# Patient Record
Sex: Female | Born: 1950
Health system: Southern US, Community
[De-identification: ages and names within clinical notes are randomized; demographics above are authoritative.]

## PROBLEM LIST (undated history)

## (undated) DIAGNOSIS — D649 Anemia, unspecified: Secondary | ICD-10-CM

## (undated) DIAGNOSIS — K219 Gastro-esophageal reflux disease without esophagitis: Secondary | ICD-10-CM

## (undated) DIAGNOSIS — N200 Calculus of kidney: Secondary | ICD-10-CM

## (undated) DIAGNOSIS — E785 Hyperlipidemia, unspecified: Secondary | ICD-10-CM

## (undated) DIAGNOSIS — I4892 Unspecified atrial flutter: Secondary | ICD-10-CM

## (undated) DIAGNOSIS — H269 Unspecified cataract: Secondary | ICD-10-CM

## (undated) DIAGNOSIS — M1990 Unspecified osteoarthritis, unspecified site: Secondary | ICD-10-CM

## (undated) DIAGNOSIS — T7840XA Allergy, unspecified, initial encounter: Secondary | ICD-10-CM

## (undated) DIAGNOSIS — I10 Essential (primary) hypertension: Secondary | ICD-10-CM

## (undated) DIAGNOSIS — R011 Cardiac murmur, unspecified: Secondary | ICD-10-CM

## (undated) HISTORY — DX: Unspecified osteoarthritis, unspecified site: M19.90

## (undated) HISTORY — DX: Anemia, unspecified: D64.9

## (undated) HISTORY — PX: TONSILLECTOMY: SUR1361

## (undated) HISTORY — DX: Allergy, unspecified, initial encounter: T78.40XA

## (undated) HISTORY — DX: Unspecified cataract: H26.9

## (undated) HISTORY — DX: Gastro-esophageal reflux disease without esophagitis: K21.9

## (undated) HISTORY — DX: Essential (primary) hypertension: I10

## (undated) HISTORY — DX: Unspecified atrial flutter: I48.92

## (undated) HISTORY — PX: EYE SURGERY: SHX253

## (undated) HISTORY — PX: SHOULDER SURGERY: SHX246

## (undated) HISTORY — DX: Hyperlipidemia, unspecified: E78.5

## (undated) HISTORY — DX: Cardiac murmur, unspecified: R01.1

## (undated) HISTORY — DX: Calculus of kidney: N20.0

---

## 1997-10-24 ENCOUNTER — Ambulatory Visit (HOSPITAL_COMMUNITY): Admission: RE | Admit: 1997-10-24 | Discharge: 1997-10-24 | Payer: Self-pay | Admitting: *Deleted

## 1998-08-23 ENCOUNTER — Ambulatory Visit (HOSPITAL_BASED_OUTPATIENT_CLINIC_OR_DEPARTMENT_OTHER): Admission: RE | Admit: 1998-08-23 | Discharge: 1998-08-23 | Payer: Self-pay | Admitting: *Deleted

## 1998-11-26 ENCOUNTER — Other Ambulatory Visit: Admission: RE | Admit: 1998-11-26 | Discharge: 1998-11-26 | Payer: Self-pay | Admitting: *Deleted

## 2000-02-04 ENCOUNTER — Ambulatory Visit (HOSPITAL_COMMUNITY): Admission: RE | Admit: 2000-02-04 | Discharge: 2000-02-04 | Payer: Self-pay | Admitting: *Deleted

## 2000-03-11 ENCOUNTER — Other Ambulatory Visit: Admission: RE | Admit: 2000-03-11 | Discharge: 2000-03-11 | Payer: Self-pay | Admitting: *Deleted

## 2001-03-22 ENCOUNTER — Other Ambulatory Visit: Admission: RE | Admit: 2001-03-22 | Discharge: 2001-03-22 | Payer: Self-pay | Admitting: Internal Medicine

## 2001-04-04 ENCOUNTER — Encounter: Payer: Self-pay | Admitting: Internal Medicine

## 2001-04-04 ENCOUNTER — Ambulatory Visit (HOSPITAL_COMMUNITY): Admission: RE | Admit: 2001-04-04 | Discharge: 2001-04-04 | Payer: Self-pay | Admitting: *Deleted

## 2002-03-22 ENCOUNTER — Other Ambulatory Visit: Admission: RE | Admit: 2002-03-22 | Discharge: 2002-03-22 | Payer: Self-pay | Admitting: Internal Medicine

## 2002-04-11 ENCOUNTER — Encounter: Payer: Self-pay | Admitting: Internal Medicine

## 2002-04-11 ENCOUNTER — Ambulatory Visit (HOSPITAL_COMMUNITY): Admission: RE | Admit: 2002-04-11 | Discharge: 2002-04-11 | Payer: Self-pay | Admitting: Internal Medicine

## 2003-04-24 ENCOUNTER — Ambulatory Visit (HOSPITAL_COMMUNITY): Admission: RE | Admit: 2003-04-24 | Discharge: 2003-04-24 | Payer: Self-pay | Admitting: Internal Medicine

## 2004-04-01 ENCOUNTER — Other Ambulatory Visit: Admission: RE | Admit: 2004-04-01 | Discharge: 2004-04-01 | Payer: Self-pay | Admitting: Internal Medicine

## 2004-04-29 ENCOUNTER — Ambulatory Visit (HOSPITAL_COMMUNITY): Admission: RE | Admit: 2004-04-29 | Discharge: 2004-04-29 | Payer: Self-pay | Admitting: Internal Medicine

## 2005-05-06 ENCOUNTER — Ambulatory Visit (HOSPITAL_COMMUNITY): Admission: RE | Admit: 2005-05-06 | Discharge: 2005-05-06 | Payer: Self-pay | Admitting: Pulmonary Disease

## 2005-12-31 ENCOUNTER — Other Ambulatory Visit: Admission: RE | Admit: 2005-12-31 | Discharge: 2005-12-31 | Payer: Self-pay | Admitting: Internal Medicine

## 2006-05-12 ENCOUNTER — Ambulatory Visit (HOSPITAL_COMMUNITY): Admission: RE | Admit: 2006-05-12 | Discharge: 2006-05-12 | Payer: Self-pay | Admitting: Internal Medicine

## 2007-05-17 ENCOUNTER — Ambulatory Visit (HOSPITAL_COMMUNITY): Admission: RE | Admit: 2007-05-17 | Discharge: 2007-05-17 | Payer: Self-pay | Admitting: Internal Medicine

## 2008-05-08 ENCOUNTER — Other Ambulatory Visit: Admission: RE | Admit: 2008-05-08 | Discharge: 2008-05-08 | Payer: Self-pay | Admitting: Internal Medicine

## 2008-05-29 ENCOUNTER — Ambulatory Visit (HOSPITAL_COMMUNITY): Admission: RE | Admit: 2008-05-29 | Discharge: 2008-05-29 | Payer: Self-pay | Admitting: Internal Medicine

## 2009-06-06 ENCOUNTER — Ambulatory Visit (HOSPITAL_COMMUNITY): Admission: RE | Admit: 2009-06-06 | Discharge: 2009-06-06 | Payer: Self-pay | Admitting: Internal Medicine

## 2010-05-19 ENCOUNTER — Other Ambulatory Visit
Admission: RE | Admit: 2010-05-19 | Discharge: 2010-05-19 | Payer: Self-pay | Source: Home / Self Care | Admitting: Internal Medicine

## 2010-06-24 ENCOUNTER — Ambulatory Visit (HOSPITAL_COMMUNITY)
Admission: RE | Admit: 2010-06-24 | Discharge: 2010-06-24 | Payer: Self-pay | Source: Home / Self Care | Attending: Internal Medicine | Admitting: Internal Medicine

## 2010-10-14 HISTORY — PX: CATARACT EXTRACTION: SUR2

## 2010-11-24 ENCOUNTER — Telehealth: Payer: Self-pay | Admitting: Gastroenterology

## 2010-11-25 NOTE — Telephone Encounter (Signed)
Ok to schedule.

## 2010-11-25 NOTE — Telephone Encounter (Signed)
Spoke with pt and she would like to have the colon done in September. Recall entered into the system.

## 2010-11-25 NOTE — Telephone Encounter (Signed)
Pt had colon in 2003, she is due for repeat colon in 2013. Pt has met her deductible with her insurance company and would like to have her colon this year. Dr. Arlyce Dice please advise.

## 2011-02-09 ENCOUNTER — Telehealth: Payer: Self-pay

## 2011-02-09 MED ORDER — PEG-KCL-NACL-NASULF-NA ASC-C 100 G PO SOLR: 1.0000 | Freq: Once | ORAL | Status: DC

## 2011-02-09 NOTE — Telephone Encounter (Signed)
Pt scheduled for previsit for colon 02/20/11@1 :30pm, scheduled for colon with Dr. Arlyce Dice 03/13/11@1 :30pm. Pt aware of appt dates and times. Rx sent to pharmacy for prep.

## 2011-02-14 HISTORY — PX: COLONOSCOPY: SHX174

## 2011-02-20 ENCOUNTER — Ambulatory Visit (AMBULATORY_SURGERY_CENTER): Payer: Managed Care, Other (non HMO)

## 2011-02-20 VITALS — Ht 66.0 in | Wt 177.8 lb

## 2011-02-20 DIAGNOSIS — Z1211 Encounter for screening for malignant neoplasm of colon: Secondary | ICD-10-CM

## 2011-02-23 ENCOUNTER — Telehealth: Payer: Self-pay | Admitting: Gastroenterology

## 2011-02-23 ENCOUNTER — Encounter: Payer: Self-pay | Admitting: *Deleted

## 2011-02-23 ENCOUNTER — Encounter: Payer: Self-pay | Admitting: Gastroenterology

## 2011-02-23 NOTE — Telephone Encounter (Signed)
Pt has Moviprep from pharmacy but has instructions for Suprep.  Will mail pt instructions for MoviPrep.  Pt instructed to call when she receives instructions so that a previsit nurse can review with her. Ezra Sites

## 2011-03-05 ENCOUNTER — Other Ambulatory Visit: Payer: Self-pay | Admitting: Gastroenterology

## 2011-03-06 ENCOUNTER — Other Ambulatory Visit: Payer: Self-pay | Admitting: Gastroenterology

## 2011-03-13 ENCOUNTER — Encounter: Payer: Self-pay | Admitting: Gastroenterology

## 2011-03-13 ENCOUNTER — Ambulatory Visit (AMBULATORY_SURGERY_CENTER): Payer: Managed Care, Other (non HMO) | Admitting: Gastroenterology

## 2011-03-13 DIAGNOSIS — Z1211 Encounter for screening for malignant neoplasm of colon: Secondary | ICD-10-CM

## 2011-03-13 DIAGNOSIS — K5730 Diverticulosis of large intestine without perforation or abscess without bleeding: Secondary | ICD-10-CM

## 2011-03-13 MED ORDER — SODIUM CHLORIDE 0.9 % IV SOLN: 500.0000 mL | INTRAVENOUS | Status: DC

## 2011-03-13 NOTE — Patient Instructions (Addendum)
Diverticulosis Diverticulosis is a common condition that develops when small pouches (diverticula) form in the wall of the colon. The risk of diverticulosis increases with age. It happens more often in people who eat a low-fiber diet. Most individuals with diverticulosis have no symptoms. Those individuals with symptoms usually experience belly (abdominal) pain, constipation, or loose stools (diarrhea). HOME CARE INSTRUCTIONS  Increase the amount of fiber in your diet as directed by your caregiver or dietician. This may reduce symptoms of diverticulosis.   Your caregiver may recommend taking a dietary fiber supplement.   Drink at least 6 to 8 glasses of water each day to prevent constipation.   Try not to strain when you have a bowel movement.   Your caregiver may recommend avoiding nuts and seeds to prevent complications, although this is still an uncertain benefit.   Only take over-the-counter or prescription medicines for pain, discomfort, or fever as directed by your caregiver.  FOODS HAVING HIGH FIBER CONTENT INCLUDE:  Fruits. Apple, peach, pear, tangerine, raisins, prunes.   Vegetables. Brussels sprouts, asparagus, broccoli, cabbage, carrot, cauliflower, romaine lettuce, spinach, summer squash, tomato, winter squash, zucchini.   Starchy Vegetables. Baked beans, kidney beans, lima beans, split peas, lentils, potatoes (with skin).   Grains. Whole wheat bread, brown rice, bran flake cereal, plain oatmeal, white rice, shredded wheat, bran muffins.  SEEK IMMEDIATE MEDICAL CARE IF:  You develop increasing pain or severe bloating.   You have an oral temperature above 100, not controlled by medicine.   You develop vomiting or bowel movements that are bloody or black.  Document Released: 02/27/2004 Document Re-Released: 11/19/2009 [facility] Patient Information 2011 [place], [place].   PLEASE FOLLOW DISCHARGE INSTRUCTIONS GIVEN TODAY. RESUME YOUR CURRENT MEDICATIONS TODAY. REPEAT  COLONOSCOPY IN 10 YEARS. CALL us WITH ANY QUESTIONS OR CONCERNS. THANK YOU!

## 2011-03-16 ENCOUNTER — Telehealth: Payer: Self-pay

## 2011-03-16 NOTE — Telephone Encounter (Signed)

## 2011-04-13 ENCOUNTER — Encounter: Payer: Self-pay | Admitting: Internal Medicine

## 2011-04-13 ENCOUNTER — Ambulatory Visit (INDEPENDENT_AMBULATORY_CARE_PROVIDER_SITE_OTHER): Payer: Managed Care, Other (non HMO) | Admitting: Internal Medicine

## 2011-04-13 VITALS — BP 140/90 | HR 66 | Ht 64.25 in | Wt 184.0 lb

## 2011-04-13 DIAGNOSIS — Z23 Encounter for immunization: Secondary | ICD-10-CM

## 2011-04-13 DIAGNOSIS — Z87442 Personal history of urinary calculi: Secondary | ICD-10-CM

## 2011-04-13 DIAGNOSIS — I10 Essential (primary) hypertension: Secondary | ICD-10-CM

## 2011-04-13 DIAGNOSIS — K219 Gastro-esophageal reflux disease without esophagitis: Secondary | ICD-10-CM

## 2011-04-13 DIAGNOSIS — E785 Hyperlipidemia, unspecified: Secondary | ICD-10-CM

## 2011-04-13 DIAGNOSIS — J45909 Unspecified asthma, uncomplicated: Secondary | ICD-10-CM

## 2011-04-13 MED ORDER — FLUTICASONE PROPIONATE HFA 110 MCG/ACT IN AERO: 1.0000 | INHALATION_SPRAY | Freq: Two times a day (BID) | RESPIRATORY_TRACT | Status: DC

## 2011-04-13 MED ORDER — ALBUTEROL SULFATE HFA 108 (90 BASE) MCG/ACT IN AERS: 2.0000 | INHALATION_SPRAY | Freq: Four times a day (QID) | RESPIRATORY_TRACT | Status: DC | PRN

## 2011-04-13 MED ORDER — HYDROCHLOROTHIAZIDE 25 MG PO TABS: 25.0000 mg | ORAL_TABLET | Freq: Every day | ORAL | Status: DC

## 2011-04-13 MED ORDER — ROSUVASTATIN CALCIUM 10 MG PO TABS: 10.0000 mg | ORAL_TABLET | Freq: Every day | ORAL | Status: DC

## 2011-04-13 MED ORDER — QUINAPRIL HCL 20 MG PO TABS: 20.0000 mg | ORAL_TABLET | Freq: Every day | ORAL | Status: DC

## 2011-04-13 MED ORDER — MONTELUKAST SODIUM 10 MG PO TABS: 10.0000 mg | ORAL_TABLET | ORAL | Status: DC

## 2011-04-13 MED ORDER — CLONIDINE HCL 0.1 MG PO TABS: 0.1000 mg | ORAL_TABLET | Freq: Every day | ORAL | Status: DC

## 2011-04-13 MED ORDER — LANSOPRAZOLE 30 MG PO CPDR: 30.0000 mg | DELAYED_RELEASE_CAPSULE | Freq: Every day | ORAL | Status: DC

## 2011-04-13 MED ORDER — IPRATROPIUM BROMIDE HFA 17 MCG/ACT IN AERS: 2.0000 | INHALATION_SPRAY | Freq: Four times a day (QID) | RESPIRATORY_TRACT | Status: DC

## 2011-04-13 MED ORDER — THEOPHYLLINE ER 300 MG PO TB12: 300.0000 mg | ORAL_TABLET | Freq: Every day | ORAL | Status: DC

## 2011-04-13 NOTE — Progress Notes (Signed)
  Subjective:    Patient ID: Penny Barnett, female    DOB: June 11, 1951, 60 y.o.   MRN: [id number]  HPI Pt comesin today as new patient visit . Her previous PCP was Dr Marisue Brooklyn who has chanegd her practice out of [facility].  Last check up June 2012  and no change.   Had bloodwork etc.  And also had pap  Last December. Apparently ok. Following chronic disease management: Asthma :    Stable now Using  Proventil.  Prn stable since 1996   2 meds  2  Inhalers  Blood pressure : Hypertension treatment since about 2000.   On meds no se .  Quinapril and clonidine  and hctz .for about  7 years.  LIPIDS:  On crestor since  About 2000  ? Was about 280 range .  Family  Hx :mom on meds had heart surgery  Valve   and sis has elevated lipid not on meds. Brother on meds.   Fathers side died in 71 s  Etoh.  HCM :  Gi doc  Screening Arlyce Dice Review of Systems Vit d  rx in the past    Now on otcs   Unclear what .    No cp sob gi gu changes  No rash joint swelling fever weight loss.   Past history family history social history reviewed in the electronic medical record.     Objective:   Physical Exam WDWN in nad HEENT: Normocephalic ;atraumatic , Eyes;  PERRL, EOMs  Full, lids and conjunctiva clear,,Ears: no deformities, canals nl, TM landmarks normal, Nose: no deformity or discharge  Mouth : OP clear without lesion or edema . Neck: Supple without adenopathy or masses or bruits Chest:  Clear to A&P without wheezes rales or rhonchi mild kyphosis CV:  S1-S2 no gallops or murmurs peripheral perfusion is normal Abdomen:  Sof,t normal bowel sounds without hepatosplenomegaly, no guarding rebound or masses no CVA tenderness Neuro grossly normal  No focal oriented x 3  No clubbing cyanosis or edema     Assessment & Plan:  ASTHMA:  On 3 controller meds and apparently stable   No se of meds per pt   Will continue at this time HT  Stable on triple meds   No change today and plan follow up LIPIDS  On meds  without se currently   VIt D   Will follow   GERD  On daily PPI    utd on colon  HCM seems utd but  Flu shot today   Get records of recent.

## 2011-04-19 ENCOUNTER — Encounter: Payer: Self-pay | Admitting: Internal Medicine

## 2011-04-19 DIAGNOSIS — Z87442 Personal history of urinary calculi: Secondary | ICD-10-CM | POA: Insufficient documentation

## 2011-04-19 DIAGNOSIS — E785 Hyperlipidemia, unspecified: Secondary | ICD-10-CM | POA: Insufficient documentation

## 2011-04-19 DIAGNOSIS — I10 Essential (primary) hypertension: Secondary | ICD-10-CM | POA: Insufficient documentation

## 2011-04-19 DIAGNOSIS — K219 Gastro-esophageal reflux disease without esophagitis: Secondary | ICD-10-CM | POA: Insufficient documentation

## 2011-04-19 DIAGNOSIS — J45909 Unspecified asthma, uncomplicated: Secondary | ICD-10-CM | POA: Insufficient documentation

## 2011-04-19 NOTE — Patient Instructions (Signed)
Continues same meds .  Will refill today . Call if flare Get records   Recent and labs .

## 2011-06-22 ENCOUNTER — Other Ambulatory Visit: Payer: Self-pay | Admitting: Internal Medicine

## 2011-06-22 DIAGNOSIS — Z1231 Encounter for screening mammogram for malignant neoplasm of breast: Secondary | ICD-10-CM

## 2011-07-03 ENCOUNTER — Ambulatory Visit (HOSPITAL_COMMUNITY)
Admission: RE | Admit: 2011-07-03 | Discharge: 2011-07-03 | Disposition: A | Discharge status: Home / Self Care | Payer: Managed Care, Other (non HMO) | Source: Ambulatory Visit | Attending: Internal Medicine | Admitting: Internal Medicine

## 2011-07-03 DIAGNOSIS — Z1231 Encounter for screening mammogram for malignant neoplasm of breast: Secondary | ICD-10-CM | POA: Insufficient documentation

## 2011-07-13 ENCOUNTER — Ambulatory Visit (HOSPITAL_COMMUNITY): Payer: Managed Care, Other (non HMO)

## 2011-09-14 ENCOUNTER — Ambulatory Visit (INDEPENDENT_AMBULATORY_CARE_PROVIDER_SITE_OTHER): Payer: Managed Care, Other (non HMO) | Admitting: Internal Medicine

## 2011-09-14 ENCOUNTER — Encounter: Payer: Self-pay | Admitting: Internal Medicine

## 2011-09-14 VITALS — BP 126/84 | HR 81 | Temp 98.3°F | Wt 181.0 lb

## 2011-09-14 DIAGNOSIS — K5730 Diverticulosis of large intestine without perforation or abscess without bleeding: Secondary | ICD-10-CM

## 2011-09-14 DIAGNOSIS — K5790 Diverticulosis of intestine, part unspecified, without perforation or abscess without bleeding: Secondary | ICD-10-CM

## 2011-09-14 DIAGNOSIS — R1032 Left lower quadrant pain: Secondary | ICD-10-CM

## 2011-09-14 LAB — POCT URINALYSIS DIPSTICK
Glucose, UA: NEGATIVE
Ketones, UA: NEGATIVE
Leukocytes, UA: NEGATIVE
Spec Grav, UA: 1.025
Urobilinogen, UA: 1

## 2011-09-14 MED ORDER — CIPROFLOXACIN HCL 500 MG PO TABS: 500.0000 mg | ORAL_TABLET | Freq: Two times a day (BID) | ORAL | Status: AC

## 2011-09-14 MED ORDER — METRONIDAZOLE 500 MG PO TABS: 500.0000 mg | ORAL_TABLET | Freq: Three times a day (TID) | ORAL | Status: AC

## 2011-09-14 NOTE — Patient Instructions (Signed)
This could be diverticulitis.  Antibiotic as discussed  . Expect improvement in the next 48 - 72 hours . Contact us if  Worsening. Or fever. Consider ct scans and other evals as appropriate.  Recheck in about 10 days .    Diverticulitis A diverticulum is a small pouch or sac on the colon. Diverticulosis is the presence of these diverticula on the colon. Diverticulitis is the irritation (inflammation) or infection of diverticula. CAUSES  The colon and its diverticula contain bacteria. If food particles block the tiny opening to a diverticulum, the bacteria inside can grow and cause an increase in pressure. This leads to infection and inflammation and is called diverticulitis. SYMPTOMS   Abdominal pain and tenderness. Usually, the pain is located on the left side of your abdomen. However, it could be located elsewhere.   Fever.   Bloating.   Feeling sick to your stomach (nausea).   Throwing up (vomiting).   Abnormal stools.  DIAGNOSIS  Your caregiver will take a history and perform a physical exam. Since many things can cause abdominal pain, other tests may be necessary. Tests may include:  Blood tests.   Urine tests.   X-ray of the abdomen.   CT scan of the abdomen.  Sometimes, surgery is needed to determine if diverticulitis or other conditions are causing your symptoms. TREATMENT  Most of the time, you can be treated without surgery. Treatment includes:  Resting the bowels by only having liquids for a few days. As you improve, you will need to eat a low-fiber diet.   Intravenous (IV) fluids if you are losing body fluids (dehydrated).   Antibiotic medicines that treat infections may be given.   Pain and nausea medicine, if needed.   Surgery if the inflamed diverticulum has burst.  HOME CARE INSTRUCTIONS   Try a clear liquid diet (broth, tea, or water for as long as directed by your caregiver). You may then gradually begin a low-fiber diet as tolerated. A low-fiber  diet is a diet with less than 10 grams of fiber. Choose the foods below to reduce fiber in the diet:   White breads, cereals, rice, and pasta.   Cooked fruits and vegetables or soft fresh fruits and vegetables without the skin.   Ground or well-cooked tender beef, ham, veal, lamb, pork, or poultry.   Eggs and seafood.   After your diverticulitis symptoms have improved, your caregiver may put you on a high-fiber diet. A high-fiber diet includes 14 grams of fiber for every 1000 calories consumed. For a standard 2000 calorie diet, you would need 28 grams of fiber. Follow these diet guidelines to help you increase the fiber in your diet. It is important to slowly increase the amount fiber in your diet to avoid gas, constipation, and bloating.   Choose whole-grain breads, cereals, pasta, and brown rice.   Choose fresh fruits and vegetables with the skin on. Do not overcook vegetables because the more vegetables are cooked, the more fiber is lost.   Choose more nuts, seeds, legumes, dried peas, beans, and lentils.   Look for food products that have greater than 3 grams of fiber per serving on the Nutrition Facts label.   Take all medicine as directed by your caregiver.   If your caregiver has given you a follow-up appointment, it is very important that you go. Not going could result in lasting (chronic) or permanent injury, pain, and disability. If there is any problem keeping the appointment, call to reschedule.  SEEK  MEDICAL CARE IF:   Your pain does not improve.   You have a hard time advancing your diet beyond clear liquids.   Your bowel movements do not return to normal.  SEEK IMMEDIATE MEDICAL CARE IF:   Your pain becomes worse.   You have an oral temperature above 102 F (38.9 C), not controlled by medicine.   You have repeated vomiting.   You have bloody or black, tarry stools.   Symptoms that brought you to your caregiver become worse or are not getting better.  MAKE SURE  YOU:   Understand these instructions.   Will watch your condition.   Will get help right away if you are not doing well or get worse.  Document Released: 03/11/2005 Document Revised: 05/21/2011 Document Reviewed: 07/07/2010 [facility] Patient Information 2012 [place], [place].

## 2011-09-14 NOTE — Progress Notes (Signed)
  Subjective:    Patient ID: Penny Barnett, female    DOB: 04-17-1951, 61 y.o.   MRN: [id number]  HPI  Patient comes in today for SDA for  new problem evaluation.  onset march 29 left side pain   And better 2 days ago and then worse .  And no fever but anorexia .  No fever change in bowel habot no blood in stool.  No  VD but has decreased po intake  . No hx of diverticulitis .  No uto or uri sx.   Review of Systems Has hx of diverticulosis   No fever and no vomiting.   Hx of renal stones .  No hematuria.    No hx of same  Past history family history social history reviewed in the electronic medical record.     Objective:   Physical Exam BP 126/84  Pulse 81  Temp(Src) 98.3 F (36.8 C) (Oral)  Wt 181 lb (82.101 kg)  SpO2 99%  Physical Exam: Vital signs reviewed WUJ:WJXB is a well-developed well-nourished alert cooperative  white female who appears her stated age in no acute distress.  HEENT:Grossly normal  NECK: supple without masses, thyromegaly or bruits. CHEST/PULM:  Clear to auscultation and percussion breath sounds equal no wheeze , rales or rhonchi. No chest wall deformities or tenderness. CV: PMI is nondisplaced, S1 S2 no gallops, murmurs, rubs. Peripheral pulses are full without delay.No JVD .  ABDOMEN: Bowel sounds normal  No guard tender LLQ very localized  No rebound but has guarding local or rebound, no hepato splenomegal no CVA tenderness.  No hernia. Extremtities:  No clubbing cyanosis or edema,  NEURO:  Oriented x3, cranial nerves 3-12 appear to be intact, no obvious focal weakness,gait within normal limits no abnormal reflexes or asymmetrical SKIN: No acute rashes normal turgor, color, no bruising or petechiae. Marland Kitchen LN: no cervical  linguinal adenopathy      Assessment & Plan:  L abd pain  Very localized  No other alarm features  .  Assume poss diverticulitis.     Diverticulosis   Poss diverticulitis  utd on colonoscopy  .  Empiric rx with antibiotic .  Counseled. About alarm features and fu .   Soon .

## 2011-09-20 DIAGNOSIS — R1032 Left lower quadrant pain: Secondary | ICD-10-CM | POA: Insufficient documentation

## 2011-09-20 DIAGNOSIS — K5790 Diverticulosis of intestine, part unspecified, without perforation or abscess without bleeding: Secondary | ICD-10-CM | POA: Insufficient documentation

## 2011-09-23 ENCOUNTER — Encounter: Payer: Self-pay | Admitting: Internal Medicine

## 2011-09-23 ENCOUNTER — Ambulatory Visit (INDEPENDENT_AMBULATORY_CARE_PROVIDER_SITE_OTHER): Payer: Managed Care, Other (non HMO) | Admitting: Internal Medicine

## 2011-09-23 VITALS — BP 126/80 | HR 80 | Temp 98.4°F | Wt 181.0 lb

## 2011-09-23 DIAGNOSIS — T50905A Adverse effect of unspecified drugs, medicaments and biological substances, initial encounter: Secondary | ICD-10-CM

## 2011-09-23 DIAGNOSIS — T887XXA Unspecified adverse effect of drug or medicament, initial encounter: Secondary | ICD-10-CM

## 2011-09-23 DIAGNOSIS — K5730 Diverticulosis of large intestine without perforation or abscess without bleeding: Secondary | ICD-10-CM

## 2011-09-23 DIAGNOSIS — R1032 Left lower quadrant pain: Secondary | ICD-10-CM

## 2011-09-23 DIAGNOSIS — K5790 Diverticulosis of intestine, part unspecified, without perforation or abscess without bleeding: Secondary | ICD-10-CM

## 2011-09-23 DIAGNOSIS — Z0000 Encounter for general adult medical examination without abnormal findings: Secondary | ICD-10-CM

## 2011-09-23 LAB — BASIC METABOLIC PANEL
Calcium: 9 mg/dL (ref 8.4–10.5)
GFR: 123.91 mL/min (ref >60.00)
Glucose, Bld: 103 mg/dL — ABNORMAL HIGH (ref 70–99)
Potassium: 3.6 mEq/L (ref 3.5–5.1)
Sodium: 143 mEq/L (ref 135–145)

## 2011-09-23 LAB — HEPATIC FUNCTION PANEL
ALT: 38 U/L — ABNORMAL HIGH (ref 0–35)
AST: 26 U/L (ref 0–37)
Alkaline Phosphatase: 73 U/L (ref 39–117)
Bilirubin, Direct: 0 mg/dL (ref 0.0–0.3)
Total Protein: 7 g/dL (ref 6.0–8.3)

## 2011-09-23 LAB — LIPID PANEL
HDL: 57.5 mg/dL (ref >39.00)
Total CHOL/HDL Ratio: 3

## 2011-09-23 LAB — CBC WITH DIFFERENTIAL/PLATELET
Basophils Relative: 0.6 % (ref 0.0–3.0)
Eosinophils Relative: 3.2 % (ref 0.0–5.0)
Lymphocytes Relative: 24.3 % (ref 12.0–46.0)
Monocytes Relative: 7.9 % (ref 3.0–12.0)
Neutrophils Relative %: 64 % (ref 43.0–77.0)
RBC: 4.53 Mil/uL (ref 3.87–5.11)
WBC: 4.5 10*3/uL (ref 4.5–10.5)

## 2011-09-23 LAB — TSH: TSH: 0.56 u[IU]/mL (ref 0.35–5.50)

## 2011-09-23 NOTE — Patient Instructions (Signed)
No need for  Further antibiotic lab today and then planned follow up.

## 2011-09-23 NOTE — Progress Notes (Signed)
  Subjective:    Patient ID: Penny Barnett, female    DOB: Nov 21, 1950, 61 y.o.   MRN: [id number]  HPI Pt comesin for follow up of her abd pain presumed diverticultitis . She was given cipro and flagyl.  She took those antibiotics for about 7 days. The pain resolved in about 24-48 hours .  However she noted she had difficulty sleeping wondered if it was an interaction with theophylline. No vomiting a few loose stools no fever or blood.  So she stopped the antibiotic early and her sleep is better.  She is here to get laboratory studies for her checkup.   Review of Systems No fever vomiting hematuria  Past history family history social history reviewed in the electronic medical record.     Objective:   Physical Exam BP 126/80  Pulse 80  Temp(Src) 98.4 F (36.9 C) (Oral)  Wt 181 lb (82.101 kg)  SpO2 98% Looks well Abdominal exam normal no masses guarding rebound or tenderness and no flank pain.       Assessment & Plan:    LLQ pain better   presumed diverticulitis mild respond to antibiotic  Side effect of medication unclear interaction versus weakening effect is seen with some patient's with Cipro.  Because she is so much better and had a week of antibiotics no further treatment unless recurs no need for scans at this point she will get her fasting lab work today and implant her routine followup. She'll contact us if there's any recurrence in this difficulty.

## 2011-09-30 ENCOUNTER — Other Ambulatory Visit: Payer: Managed Care, Other (non HMO)

## 2011-10-07 ENCOUNTER — Encounter: Payer: Self-pay | Admitting: Internal Medicine

## 2011-10-07 ENCOUNTER — Ambulatory Visit (INDEPENDENT_AMBULATORY_CARE_PROVIDER_SITE_OTHER): Payer: Managed Care, Other (non HMO) | Admitting: Internal Medicine

## 2011-10-07 VITALS — BP 112/74 | HR 74 | Temp 98.3°F | Ht 64.0 in | Wt 175.0 lb

## 2011-10-07 DIAGNOSIS — Z0000 Encounter for general adult medical examination without abnormal findings: Secondary | ICD-10-CM

## 2011-10-07 DIAGNOSIS — E785 Hyperlipidemia, unspecified: Secondary | ICD-10-CM

## 2011-10-07 DIAGNOSIS — J45909 Unspecified asthma, uncomplicated: Secondary | ICD-10-CM

## 2011-10-07 DIAGNOSIS — I10 Essential (primary) hypertension: Secondary | ICD-10-CM

## 2011-10-07 DIAGNOSIS — R7989 Other specified abnormal findings of blood chemistry: Secondary | ICD-10-CM | POA: Insufficient documentation

## 2011-10-07 DIAGNOSIS — R945 Abnormal results of liver function studies: Secondary | ICD-10-CM

## 2011-10-07 DIAGNOSIS — K219 Gastro-esophageal reflux disease without esophagitis: Secondary | ICD-10-CM

## 2011-10-07 MED ORDER — THEOPHYLLINE ER 300 MG PO TB12: 300.0000 mg | ORAL_TABLET | Freq: Every day | ORAL | Status: DC

## 2011-10-07 MED ORDER — CLONIDINE HCL 0.1 MG PO TABS: 0.1000 mg | ORAL_TABLET | Freq: Every day | ORAL | Status: DC

## 2011-10-07 MED ORDER — QUINAPRIL HCL 20 MG PO TABS: 20.0000 mg | ORAL_TABLET | Freq: Every day | ORAL | Status: DC

## 2011-10-07 MED ORDER — HYDROCHLOROTHIAZIDE 25 MG PO TABS: 25.0000 mg | ORAL_TABLET | Freq: Every day | ORAL | Status: DC

## 2011-10-07 MED ORDER — MONTELUKAST SODIUM 10 MG PO TABS: 10.0000 mg | ORAL_TABLET | ORAL | Status: DC

## 2011-10-07 NOTE — Progress Notes (Signed)
Subjective:    Patient ID: Penny Barnett, female    DOB: [birth year], 61 y.o.   MRN: [id number]  HPI Patient comes in today for Preventive Health Care visit  Since her last visit her left lower quadrant pain is totally resolved and not resumed. Asthma stable on current medications. HT controlled no side effects of medicine LIPID no side effects of medicine reported is on low-dose Crestor If now on a different insurance only pays for generics. Hard to get individual insurance  Review of Systems ROS:  GEN/ HEENT: No fever, significant weight changes sweats headaches vision problems hearing changes, CV/ PULM; No chest pain shortness of breath cough, syncope,edema  change in exercise tolerance. GI /GU: No adominal pain, vomiting, change in bowel habits. No blood in the stool. No significant GU symptoms. SKIN/HEME: ,no acute skin rashes suspicious lesions or bleeding. No lymphadenopathy, nodules, masses.  NEURO/ PSYCH:  No neurologic signs such as weakness numbness. No depression anxiety. IMM/ Allergy: No unusual infections.  Allergy .   REST of 12 system review negative except as per HPI  Past history family history social history reviewed in the electronic medical record. Outpatient Prescriptions Prior to Visit  Medication Sig Dispense Refill  . albuterol (PROVENTIL HFA) 108 (90 BASE) MCG/ACT inhaler Inhale 2 puffs into the lungs every 6 (six) hours as needed.  3 Inhaler  0  . cholecalciferol (VITAMIN D) 1000 UNITS tablet Take 1,000 Units by mouth daily.        . fluticasone (FLOVENT HFA) 110 MCG/ACT inhaler Inhale 1 puff into the lungs 2 (two) times daily.  3 Inhaler  3  . ipratropium (ATROVENT HFA) 17 MCG/ACT inhaler Inhale 2 puffs into the lungs every 6 (six) hours.  3 Inhaler  3  . lansoprazole (PREVACID) 30 MG capsule Take 1 capsule (30 mg total) by mouth daily.  90 capsule  3  . Multiple Vitamins-Minerals (MULTIVITAMIN WITH MINERALS) tablet Take 1 tablet by mouth daily.        .  rosuvastatin (CRESTOR) 10 MG tablet Take 1 tablet (10 mg total) by mouth daily. 1/2 daily  90 tablet  3  . cloNIDine (CATAPRES) 0.1 MG tablet Take 1 tablet (0.1 mg total) by mouth daily.  90 tablet  3  . hydrochlorothiazide (HYDRODIURIL) 25 MG tablet Take 1 tablet (25 mg total) by mouth daily.  90 tablet  3  . montelukast (SINGULAIR) 10 MG tablet Take 1 tablet (10 mg total) by mouth every morning.  90 tablet  3  . quinapril (ACCUPRIL) 20 MG tablet Take 1 tablet (20 mg total) by mouth daily.  90 tablet  3  . theophylline (THEODUR) 300 MG 12 hr tablet Take 1 tablet (300 mg total) by mouth daily.  90 tablet  3        Objective:   Physical Exam BP 112/74  Pulse 74  Temp(Src) 98.3 F (36.8 C) (Oral)  Ht 5\' 4"  (1.626 m)  Wt 175 lb (79.379 kg)  BMI 30.04 kg/m2  SpO2 99% Physical Exam: Vital signs reviewed WUJ:WJXB is a well-developed well-nourished alert cooperative  white female who appears her stated age in no acute distress.  HEENT: normocephalic atraumatic , Eyes: PERRL EOM's full, conjunctiva clear, Nares: paten,t no deformity discharge or tenderness., Ears: no deformity EAC's clear TMs with normal landmarks. Mouth: clear OP, no lesions, edema.  Moist mucous membranes. Dentition in adequate repair. NECK: supple without masses, thyromegaly or bruits. CHEST/PULM:  Clear to auscultation and percussion breath sounds equal no  wheeze , rales or rhonchi. No chest wall deformities or tenderness. Breast: normal by inspection . No dimpling, discharge, masses, tenderness or discharge . CV: PMI is nondisplaced, S1 S2 no gallops, murmurs, rubs. Peripheral pulses are full without delay.No JVD .  ABDOMEN: Bowel sounds normal nontender  No guard or rebound, no hepato splenomegal no CVA tenderness.  No hernia. Extremtities:  No clubbing cyanosis or edema, no acute joint swelling or redness no focal atrophy NEURO:  Oriented x3, cranial nerves 3-12 appear to be intact, no obvious focal weakness,gait within  normal limits no abnormal reflexes or asymmetrical SKIN: No acute rashes normal turgor, color, no bruising or petechiae. PSYCH: Oriented, good eye contact, no obvious depression anxiety, cognition and judgment appear normal. LN: no cervical axillary inguinal adenopathy    Lab Results  Component Value Date   WBC 4.5 09/23/2011   HGB 12.4 09/23/2011   HCT 37.4 09/23/2011   PLT 229.0 09/23/2011   GLUCOSE 103* 09/23/2011   CHOL 150 09/23/2011   TRIG 65.0 09/23/2011   HDL 57.50 09/23/2011   LDLCALC 80 09/23/2011   ALT 38* 09/23/2011   AST 26 09/23/2011   NA 143 09/23/2011   K 3.6 09/23/2011   CL 107 09/23/2011   CREATININE 0.5 09/23/2011   BUN 18 09/23/2011   CO2 26 09/23/2011   TSH 0.56 09/23/2011      Assessment & Plan:  Preventive Health Care Counseled regarding healthy nutrition, exercise, sleep, injury prevention, calcium vit d and healthy weight . Due for a Pap on a three-year schedule last one was December 2011 HT stable  No med change ASTHMA stable no sx no need for pfts at this time  No med change LIPIDS  No change  LLQ P no recurrence presumed divertic utd on colonoscopy done in 2012 Abnormal lft alt minimal could be from medications illness or nonspecific Discussed this with the patient to be complete we will recheck her LFTs with hepatitis B and C. serologies in about 3 months if they are okay we will follow as needed or indicated

## 2011-10-07 NOTE — Patient Instructions (Addendum)
Continue lifestyle intervention healthy eating and exercise . No change in meds at this time . Pap smear every 3 years. Get Pneumovax at age 61 Look into cost of shingles shot.   The minor changes in liver tests could be from medications or the recent illness. Recheck liver tests with HEP b aby and ag and HEP c aby   in 3 months.  Labs without visit .  If okay can do checkup in a year.

## 2012-01-06 ENCOUNTER — Other Ambulatory Visit: Payer: Managed Care, Other (non HMO)

## 2012-07-27 ENCOUNTER — Other Ambulatory Visit: Payer: Self-pay | Admitting: *Deleted

## 2012-07-27 MED ORDER — MONTELUKAST SODIUM 10 MG PO TABS: 10.0000 mg | ORAL_TABLET | ORAL | Status: DC

## 2012-07-27 MED ORDER — HYDROCHLOROTHIAZIDE 25 MG PO TABS: 25.0000 mg | ORAL_TABLET | Freq: Every day | ORAL | Status: DC

## 2012-07-27 MED ORDER — CLONIDINE HCL 0.1 MG PO TABS: 0.1000 mg | ORAL_TABLET | Freq: Every day | ORAL | Status: DC

## 2012-07-27 MED ORDER — QUINAPRIL HCL 20 MG PO TABS: 20.0000 mg | ORAL_TABLET | Freq: Every day | ORAL | Status: DC

## 2012-07-27 MED ORDER — THEOPHYLLINE ER 300 MG PO TB12: 300.0000 mg | ORAL_TABLET | Freq: Every day | ORAL | Status: DC

## 2012-08-26 ENCOUNTER — Other Ambulatory Visit: Payer: Self-pay | Admitting: Internal Medicine

## 2012-08-26 DIAGNOSIS — Z1231 Encounter for screening mammogram for malignant neoplasm of breast: Secondary | ICD-10-CM

## 2012-09-06 ENCOUNTER — Ambulatory Visit (HOSPITAL_COMMUNITY)
Admission: RE | Admit: 2012-09-06 | Discharge: 2012-09-06 | Disposition: A | Discharge status: Home / Self Care | Payer: BC Managed Care – PPO | Source: Ambulatory Visit | Attending: Internal Medicine | Admitting: Internal Medicine

## 2012-09-06 DIAGNOSIS — Z1231 Encounter for screening mammogram for malignant neoplasm of breast: Secondary | ICD-10-CM

## 2012-10-07 ENCOUNTER — Encounter: Payer: Managed Care, Other (non HMO) | Admitting: Internal Medicine

## 2012-10-07 ENCOUNTER — Other Ambulatory Visit (INDEPENDENT_AMBULATORY_CARE_PROVIDER_SITE_OTHER): Payer: BC Managed Care – PPO

## 2012-10-07 DIAGNOSIS — Z0000 Encounter for general adult medical examination without abnormal findings: Secondary | ICD-10-CM

## 2012-10-07 LAB — HEPATIC FUNCTION PANEL
ALT: 36 U/L — ABNORMAL HIGH (ref 0–35)
Albumin: 4 g/dL (ref 3.5–5.2)
Alkaline Phosphatase: 103 U/L (ref 39–117)
Bilirubin, Direct: 0 mg/dL (ref 0.0–0.3)
Total Protein: 7 g/dL (ref 6.0–8.3)

## 2012-10-07 LAB — CBC WITH DIFFERENTIAL/PLATELET
Basophils Relative: 0.7 % (ref 0.0–3.0)
Eosinophils Relative: 4 % (ref 0.0–5.0)
Hemoglobin: 12.9 g/dL (ref 12.0–15.0)
Lymphocytes Relative: 25.9 % (ref 12.0–46.0)
MCV: 82.2 fl (ref 78.0–100.0)
Monocytes Absolute: 0.4 10*3/uL (ref 0.1–1.0)
Neutro Abs: 3.3 10*3/uL (ref 1.4–7.7)
Neutrophils Relative %: 61.7 % (ref 43.0–77.0)
RBC: 4.69 Mil/uL (ref 3.87–5.11)
WBC: 5.3 10*3/uL (ref 4.5–10.5)

## 2012-10-07 LAB — BASIC METABOLIC PANEL
CO2: 26 mEq/L (ref 19–32)
Chloride: 104 mEq/L (ref 96–112)
Creatinine, Ser: 0.6 mg/dL (ref 0.4–1.2)
Sodium: 139 mEq/L (ref 135–145)

## 2012-10-07 LAB — LDL CHOLESTEROL, DIRECT: Direct LDL: 159.2 mg/dL

## 2012-10-07 LAB — LIPID PANEL: Total CHOL/HDL Ratio: 4

## 2012-10-14 ENCOUNTER — Encounter: Payer: Self-pay | Admitting: Internal Medicine

## 2012-10-14 ENCOUNTER — Ambulatory Visit (INDEPENDENT_AMBULATORY_CARE_PROVIDER_SITE_OTHER): Payer: BC Managed Care – PPO | Admitting: Internal Medicine

## 2012-10-14 ENCOUNTER — Other Ambulatory Visit (HOSPITAL_COMMUNITY)
Admission: RE | Admit: 2012-10-14 | Discharge: 2012-10-14 | Disposition: A | Discharge status: Home / Self Care | Payer: BC Managed Care – PPO | Source: Ambulatory Visit | Attending: Internal Medicine | Admitting: Internal Medicine

## 2012-10-14 VITALS — BP 112/80 | HR 75 | Temp 97.8°F | Wt 178.0 lb

## 2012-10-14 DIAGNOSIS — T887XXA Unspecified adverse effect of drug or medicament, initial encounter: Secondary | ICD-10-CM

## 2012-10-14 DIAGNOSIS — Z01419 Encounter for gynecological examination (general) (routine) without abnormal findings: Secondary | ICD-10-CM

## 2012-10-14 DIAGNOSIS — I10 Essential (primary) hypertension: Secondary | ICD-10-CM

## 2012-10-14 DIAGNOSIS — E785 Hyperlipidemia, unspecified: Secondary | ICD-10-CM

## 2012-10-14 DIAGNOSIS — Z1151 Encounter for screening for human papillomavirus (HPV): Secondary | ICD-10-CM | POA: Insufficient documentation

## 2012-10-14 DIAGNOSIS — R7989 Other specified abnormal findings of blood chemistry: Secondary | ICD-10-CM

## 2012-10-14 DIAGNOSIS — T50905A Adverse effect of unspecified drugs, medicaments and biological substances, initial encounter: Secondary | ICD-10-CM

## 2012-10-14 DIAGNOSIS — R945 Abnormal results of liver function studies: Secondary | ICD-10-CM

## 2012-10-14 DIAGNOSIS — Z0000 Encounter for general adult medical examination without abnormal findings: Secondary | ICD-10-CM

## 2012-10-14 DIAGNOSIS — J45909 Unspecified asthma, uncomplicated: Secondary | ICD-10-CM

## 2012-10-14 NOTE — Patient Instructions (Addendum)
Try 5 mg  crestor every other day and if  No muscle aches we can recheck lipid and lfts etc in about 6-8 weeks.  Will also add hep c aby  If not already done.  If getting m,uscel aches  then contact us and we can try pravachol  Therapy  And then check lipids.  Check into cost for zostavax vaccine.     Preventive Care for Adults, Female A healthy lifestyle and preventive care can promote health and wellness. Preventive health guidelines for women include the following key practices.  A routine yearly physical is a good way to check with your caregiver about your health and preventive screening. It is a chance to share any concerns and updates on your health, and to receive a thorough exam.  Visit your dentist for a routine exam and preventive care every 6 months. Brush your teeth twice a day and floss once a day. Good oral hygiene prevents tooth decay and gum disease.  The frequency of eye exams is based on your age, health, family medical history, use of contact lenses, and other factors. Follow your caregiver's recommendations for frequency of eye exams.  Eat a healthy diet. Foods like vegetables, fruits, whole grains, low-fat dairy products, and lean protein foods contain the nutrients you need without too many calories. Decrease your intake of foods high in solid fats, added sugars, and salt. Eat the right amount of calories for you.Get information about a proper diet from your caregiver, if necessary.  Regular physical exercise is one of the most important things you can do for your health. Most adults should get at least 150 minutes of moderate-intensity exercise (any activity that increases your heart rate and causes you to sweat) each week. In addition, most adults need muscle-strengthening exercises on 2 or more days a week.  Maintain a healthy weight. The body mass index (BMI) is a screening tool to identify possible weight problems. It provides an estimate of body fat based on height  and weight. Your caregiver can help determine your BMI, and can help you achieve or maintain a healthy weight.For adults 20 years and older:  A BMI below 18.5 is considered underweight.  A BMI of 18.5 to 24.9 is normal.  A BMI of 25 to 29.9 is considered overweight.  A BMI of 30 and above is considered obese.  Maintain normal blood lipids and cholesterol levels by exercising and minimizing your intake of saturated fat. Eat a balanced diet with plenty of fruit and vegetables. Blood tests for lipids and cholesterol should begin at age 37 and be repeated every 5 years. If your lipid or cholesterol levels are high, you are over 50, or you are at high risk for heart disease, you may need your cholesterol levels checked more frequently.Ongoing high lipid and cholesterol levels should be treated with medicines if diet and exercise are not effective.  If you smoke, find out from your caregiver how to quit. If you do not use tobacco, do not start.  If you are pregnant, do not drink alcohol. If you are breastfeeding, be very cautious about drinking alcohol. If you are not pregnant and choose to drink alcohol, do not exceed 1 drink per day. One drink is considered to be 12 ounces (355 mL) of beer, 5 ounces (148 mL) of wine, or 1.5 ounces (44 mL) of liquor.  Avoid use of street drugs. Do not share needles with anyone. Ask for help if you need support or instructions about stopping  the use of drugs.  High blood pressure causes heart disease and increases the risk of stroke. Your blood pressure should be checked at least every 1 to 2 years. Ongoing high blood pressure should be treated with medicines if weight loss and exercise are not effective.  If you are 33 to 64 years old, ask your caregiver if you should take aspirin to prevent strokes.  Diabetes screening involves taking a blood sample to check your fasting blood sugar level. This should be done once every 3 years, after age 51, if you are within  normal weight and without risk factors for diabetes. Testing should be considered at a younger age or be carried out more frequently if you are overweight and have at least 1 risk factor for diabetes.  Breast cancer screening is essential preventive care for women. You should practice "breast self-awareness." This means understanding the normal appearance and feel of your breasts and may include breast self-examination. Any changes detected, no matter how small, should be reported to a caregiver. Women in their 85s and 30s should have a clinical breast exam (CBE) by a caregiver as part of a regular health exam every 1 to 3 years. After age 40, women should have a CBE every year. Starting at age 69, women should consider having a mammography (breast X-ray test) every year. Women who have a family history of breast cancer should talk to their caregiver about genetic screening. Women at a high risk of breast cancer should talk to their caregivers about having magnetic resonance imaging (MRI) and a mammography every year.  The Pap test is a screening test for cervical cancer. A Pap test can show cell changes on the cervix that might become cervical cancer if left untreated. A Pap test is a procedure in which cells are obtained and examined from the lower end of the uterus (cervix).  Women should have a Pap test starting at age 52.  Between ages 39 and 1, Pap tests should be repeated every 2 years.  Beginning at age 50, you should have a Pap test every 3 years as long as the past 3 Pap tests have been normal.  Some women have medical problems that increase the chance of getting cervical cancer. Talk to your caregiver about these problems. It is especially important to talk to your caregiver if a new problem develops soon after your last Pap test. In these cases, your caregiver may recommend more frequent screening and Pap tests.  The above recommendations are the same for women who have or have not gotten  the vaccine for human papillomavirus (HPV).  If you had a hysterectomy for a problem that was not cancer or a condition that could lead to cancer, then you no longer need Pap tests. Even if you no longer need a Pap test, a regular exam is a good idea to make sure no other problems are starting.  If you are between ages 48 and 33, and you have had normal Pap tests going back 10 years, you no longer need Pap tests. Even if you no longer need a Pap test, a regular exam is a good idea to make sure no other problems are starting.  If you have had past treatment for cervical cancer or a condition that could lead to cancer, you need Pap tests and screening for cancer for at least 20 years after your treatment.  If Pap tests have been discontinued, risk factors (such as a new sexual partner) need to  be reassessed to determine if screening should be resumed.  The HPV test is an additional test that may be used for cervical cancer screening. The HPV test looks for the virus that can cause the cell changes on the cervix. The cells collected during the Pap test can be tested for HPV. The HPV test could be used to screen women aged 44 years and older, and should be used in women of any age who have unclear Pap test results. After the age of 24, women should have HPV testing at the same frequency as a Pap test.  Colorectal cancer can be detected and often prevented. Most routine colorectal cancer screening begins at the age of 26 and continues through age 49. However, your caregiver may recommend screening at an earlier age if you have risk factors for colon cancer. On a yearly basis, your caregiver may provide home test kits to check for hidden blood in the stool. Use of a small camera at the end of a tube, to directly examine the colon (sigmoidoscopy or colonoscopy), can detect the earliest forms of colorectal cancer. Talk to your caregiver about this at age 66, when routine screening begins. Direct examination of  the colon should be repeated every 5 to 10 years through age 75, unless early forms of pre-cancerous polyps or small growths are found.  Hepatitis C blood testing is recommended for all people born from [birth year] through 1965 and any individual with known risks for hepatitis C.  Practice safe sex. Use condoms and avoid high-risk sexual practices to reduce the spread of sexually transmitted infections (STIs). STIs include gonorrhea, chlamydia, syphilis, trichomonas, herpes, HPV, and human immunodeficiency virus (HIV). Herpes, HIV, and HPV are viral illnesses that have no cure. They can result in disability, cancer, and death. Sexually active women aged 46 and younger should be checked for chlamydia. Older women with new or multiple partners should also be tested for chlamydia. Testing for other STIs is recommended if you are sexually active and at increased risk.  Osteoporosis is a disease in which the bones lose minerals and strength with aging. This can result in serious bone fractures. The risk of osteoporosis can be identified using a bone density scan. Women ages 36 and over and women at risk for fractures or osteoporosis should discuss screening with their caregivers. Ask your caregiver whether you should take a calcium supplement or vitamin D to reduce the rate of osteoporosis.  Menopause can be associated with physical symptoms and risks. Hormone replacement therapy is available to decrease symptoms and risks. You should talk to your caregiver about whether hormone replacement therapy is right for you.  Use sunscreen with sun protection factor (SPF) of 30 or more. Apply sunscreen liberally and repeatedly throughout the day. You should seek shade when your shadow is shorter than you. Protect yourself by wearing long sleeves, pants, a wide-brimmed hat, and sunglasses year round, whenever you are outdoors.  Once a month, do a whole body skin exam, using a mirror to look at the skin on your back. Notify  your caregiver of new moles, moles that have irregular borders, moles that are larger than a pencil eraser, or moles that have changed in shape or color.  Stay current with required immunizations.  Influenza. You need a dose every fall (or winter). The composition of the flu vaccine changes each year, so being vaccinated once is not enough.  Pneumococcal polysaccharide. You need 1 to 2 doses if you smoke cigarettes or if you  have certain chronic medical conditions. You need 1 dose at age 13 (or older) if you have never been vaccinated.  Tetanus, diphtheria, pertussis (Tdap, Td). Get 1 dose of Tdap vaccine if you are younger than age 86, are over 58 and have contact with an infant, are a Research scientist (physical sciences), are pregnant, or simply want to be protected from whooping cough. After that, you need a Td booster dose every 10 years. Consult your caregiver if you have not had at least 3 tetanus and diphtheria-containing shots sometime in your life or have a deep or dirty wound.  HPV. You need this vaccine if you are a woman age 57 or younger. The vaccine is given in 3 doses over 6 months.  Measles, mumps, rubella (MMR). You need at least 1 dose of MMR if you were born in 1957 or later. You may also need a second dose.  Meningococcal. If you are age 78 to 32 and a first-year college student living in a residence hall, or have one of several medical conditions, you need to get vaccinated against meningococcal disease. You may also need additional booster doses.  Zoster (shingles). If you are age 61 or older, you should get this vaccine.  Varicella (chickenpox). If you have never had chickenpox or you were vaccinated but received only 1 dose, talk to your caregiver to find out if you need this vaccine.  Hepatitis A. You need this vaccine if you have a specific risk factor for hepatitis A virus infection or you simply wish to be protected from this disease. The vaccine is usually given as 2 doses, 6 to 18  months apart.  Hepatitis B. You need this vaccine if you have a specific risk factor for hepatitis B virus infection or you simply wish to be protected from this disease. The vaccine is given in 3 doses, usually over 6 months. Preventive Services / Frequency Ages 3 to 1  Blood pressure check.** / Every 1 to 2 years.  Lipid and cholesterol check.** / Every 5 years beginning at age 59.  Clinical breast exam.** / Every 3 years for women in their 25s and 30s.  Pap test.** / Every 2 years from ages 76 through 71. Every 3 years starting at age 68 through age 39 or 73 with a history of 3 consecutive normal Pap tests.  HPV screening.** / Every 3 years from ages 43 through ages 53 to 44 with a history of 3 consecutive normal Pap tests.  Hepatitis C blood test.** / For any individual with known risks for hepatitis C.  Skin self-exam. / Monthly.  Influenza immunization.** / Every year.  Pneumococcal polysaccharide immunization.** / 1 to 2 doses if you smoke cigarettes or if you have certain chronic medical conditions.  Tetanus, diphtheria, pertussis (Tdap, Td) immunization. / A one-time dose of Tdap vaccine. After that, you need a Td booster dose every 10 years.  HPV immunization. / 3 doses over 6 months, if you are 98 and younger.  Measles, mumps, rubella (MMR) immunization. / You need at least 1 dose of MMR if you were born in 1957 or later. You may also need a second dose.  Meningococcal immunization. / 1 dose if you are age 9 to 20 and a first-year college student living in a residence hall, or have one of several medical conditions, you need to get vaccinated against meningococcal disease. You may also need additional booster doses.  Varicella immunization.** / Consult your caregiver.  Hepatitis A immunization.** / Consult  your caregiver. 2 doses, 6 to 18 months apart.  Hepatitis B immunization.** / Consult your caregiver. 3 doses usually over 6 months. Ages 51 to 83  Blood  pressure check.** / Every 1 to 2 years.  Lipid and cholesterol check.** / Every 5 years beginning at age 26.  Clinical breast exam.** / Every year after age 21.  Mammogram.** / Every year beginning at age 36 and continuing for as long as you are in good health. Consult with your caregiver.  Pap test.** / Every 3 years starting at age 49 through age 77 or 67 with a history of 3 consecutive normal Pap tests.  HPV screening.** / Every 3 years from ages 20 through ages 32 to 102 with a history of 3 consecutive normal Pap tests.  Fecal occult blood test (FOBT) of stool. / Every year beginning at age 37 and continuing until age 30. You may not need to do this test if you get a colonoscopy every 10 years.  Flexible sigmoidoscopy or colonoscopy.** / Every 5 years for a flexible sigmoidoscopy or every 10 years for a colonoscopy beginning at age 81 and continuing until age 58.  Hepatitis C blood test.** / For all people born from [birth year] through 1967 and any individual with known risks for hepatitis C.  Skin self-exam. / Monthly.  Influenza immunization.** / Every year.  Pneumococcal polysaccharide immunization.** / 1 to 2 doses if you smoke cigarettes or if you have certain chronic medical conditions.  Tetanus, diphtheria, pertussis (Tdap, Td) immunization.** / A one-time dose of Tdap vaccine. After that, you need a Td booster dose every 10 years.  Measles, mumps, rubella (MMR) immunization. / You need at least 1 dose of MMR if you were born in 1957 or later. You may also need a second dose.  Varicella immunization.** / Consult your caregiver.  Meningococcal immunization.** / Consult your caregiver.  Hepatitis A immunization.** / Consult your caregiver. 2 doses, 6 to 18 months apart.  Hepatitis B immunization.** / Consult your caregiver. 3 doses, usually over 6 months. Ages 67 and over  Blood pressure check.** / Every 1 to 2 years.  Lipid and cholesterol check.** / Every 5 years beginning  at age 48.  Clinical breast exam.** / Every year after age 35.  Mammogram.** / Every year beginning at age 35 and continuing for as long as you are in good health. Consult with your caregiver.  Pap test.** / Every 3 years starting at age 74 through age 82 or 91 with a 3 consecutive normal Pap tests. Testing can be stopped between 65 and 70 with 3 consecutive normal Pap tests and no abnormal Pap or HPV tests in the past 10 years.  HPV screening.** / Every 3 years from ages 25 through ages 66 or 8 with a history of 3 consecutive normal Pap tests. Testing can be stopped between 65 and 70 with 3 consecutive normal Pap tests and no abnormal Pap or HPV tests in the past 10 years.  Fecal occult blood test (FOBT) of stool. / Every year beginning at age 25 and continuing until age 77. You may not need to do this test if you get a colonoscopy every 10 years.  Flexible sigmoidoscopy or colonoscopy.** / Every 5 years for a flexible sigmoidoscopy or every 10 years for a colonoscopy beginning at age 46 and continuing until age 55.  Hepatitis C blood test.** / For all people born from [birth year] through 1967 and any individual with known risks for hepatitis  C.  Osteoporosis screening.** / A one-time screening for women ages 89 and over and women at risk for fractures or osteoporosis.  Skin self-exam. / Monthly.  Influenza immunization.** / Every year.  Pneumococcal polysaccharide immunization.** / 1 dose at age 30 (or older) if you have never been vaccinated.  Tetanus, diphtheria, pertussis (Tdap, Td) immunization. / A one-time dose of Tdap vaccine if you are over 65 and have contact with an infant, are a Research scientist (physical sciences), or simply want to be protected from whooping cough. After that, you need a Td booster dose every 10 years.  Varicella immunization.** / Consult your caregiver.  Meningococcal immunization.** / Consult your caregiver.  Hepatitis A immunization.** / Consult your caregiver. 2 doses, 6 to  18 months apart.  Hepatitis B immunization.** / Check with your caregiver. 3 doses, usually over 6 months. ** Family history and personal history of risk and conditions may change your caregiver's recommendations. Document Released: 07/28/2001 Document Revised: 08/24/2011 Document Reviewed: 10/27/2010 [facility] Patient Information 2013 [place], [place].   Continue lifestyle intervention healthy eating and exercise .

## 2012-10-14 NOTE — Progress Notes (Signed)
Chief Complaint  Patient presents with  . Annual Exam    HPI: Patient comes in today for Preventive Health Care visit  No major changes in her health since her last visit. She needs refills on her medications Due for a Pap smear it has been 3 years no history of cervical cancer Bp no side effects noted LIPIDS think she was getting muscle aches and bursitis from the Crestor 5 mg so she stopped it a month or 2 ago and seems to be doing better. Past history of side effects of atorvastatin and simvastatin Asthma  Rare use of rescue   doing well on current regimen.  ROS:  GEN/ HEENT: No fever, significant weight changes sweats headaches vision problems hearing changes, CV/ PULM; No chest pain shortness of breath cough, syncope,edema  change in exercise tolerance. GI /GU: No adominal pain, vomiting, change in bowel habits. No blood in the stool. No significant GU symptoms. SKIN/HEME: ,no acute skin rashes suspicious lesions or bleeding. No lymphadenopathy, nodules, masses.  NEURO/ PSYCH:  No neurologic signs such as weakness numbness. No depression anxiety. IMM/ Allergy: No unusual infections.  Allergy .   REST of 12 system review negative except as per HPI   Past Medical History  Diagnosis Date  . Asthma     as a child and then attack 1996  poss allergic to cat hair. ventilated x 2 . saw Dr Riverside Callas then .   Marland Kitchen GERD (gastroesophageal reflux disease)     2000 taking otc prevacid   evey day.   . Hypertension      3 meds controlled  . Hyperlipidemia   . Kidney stone     10 - 15 years     Family History  Problem Relation Age of Onset  . Breast cancer Mother   . Cancer Mother     bladder  . Uterine cancer Sister   . Colon cancer Neg Hx   . Alcohol abuse Father    Past Surgical History  Procedure Laterality Date  . Cataract extraction  10/14/10    both eyes  . Shoulder surgery      frozen shoulder rt 2006, lf 2000 [place].    History   Social History  . Marital Status: Single     Spouse Name: N/A    Number of Children: N/A  . Years of Education: N/A   Social History Main Topics  . Smoking status: Former Smoker    Quit date: 06/21/1986  . Smokeless tobacco: Never Used  . Alcohol Use: Yes     Comment: socially- once a month  . Drug Use: No  . Sexually Active: None   Other Topics Concern  . 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    HS education  single   6 hours of sleep   Hh  Of 1  Pet dog an horse.    Remote tobacco  1-2 ppd stopped 1988    Etoh couple times a month          Outpatient Encounter Prescriptions as of 10/14/2012  Medication Sig Dispense Refill  . albuterol (PROVENTIL HFA) 108 (90 BASE) MCG/ACT inhaler Inhale 2 puffs into the lungs every 6 (six) hours as needed.  3 Inhaler  0  .  cholecalciferol (VITAMIN D) 1000 UNITS tablet Take 1,000 Units by mouth daily.        . cloNIDine (CATAPRES) 0.1 MG tablet Take 1 tablet (0.1 mg total) by mouth daily.  90 tablet  0  . fluticasone (FLOVENT HFA) 110 MCG/ACT inhaler Inhale 1 puff into the lungs 2 (two) times daily.  3 Inhaler  3  . hydrochlorothiazide (HYDRODIURIL) 25 MG tablet Take 1 tablet (25 mg total) by mouth daily.  90 tablet  0  . ipratropium (ATROVENT HFA) 17 MCG/ACT inhaler Inhale 2 puffs into the lungs every 6 (six) hours.  3 Inhaler  3  . lansoprazole (PREVACID) 30 MG capsule Take 1 capsule (30 mg total) by mouth daily.  90 capsule  3  . montelukast (SINGULAIR) 10 MG tablet Take 1 tablet (10 mg total) by mouth every morning.  90 tablet  0  . Multiple Vitamins-Minerals (MULTIVITAMIN WITH MINERALS) tablet Take 1 tablet by mouth daily.        . quinapril (ACCUPRIL) 20 MG tablet Take 1 tablet (20 mg total) by mouth daily.  90 tablet  0  . rosuvastatin (CRESTOR) 10 MG tablet Take 1 tablet (10 mg total) by mouth daily. 1/2 daily  90 tablet  3  .  theophylline (THEODUR) 300 MG 12 hr tablet Take 1 tablet (300 mg total) by mouth daily.  90 tablet  0   No facility-administered encounter medications on file as of 10/14/2012.    EXAM:  BP 112/80  Pulse 75  Temp(Src) 97.8 F (36.6 C) (Oral)  Wt 178 lb (80.74 kg)  BMI 30.54 kg/m2  SpO2 98%  Body mass index is 30.54 kg/(m^2).  Physical Exam: Vital signs reviewed ZOX:WRUE is a well-developed well-nourished alert cooperative   female who appears her stated age in no acute distress.  HEENT: normocephalic atraumatic , Eyes: PERRL EOM's full, conjunctiva clear, Nares: paten,t no deformity discharge or tenderness., Ears: no deformity EAC's clear TMs with normal landmarks. Mouth: clear OP, no lesions, edema.  Moist mucous membranes. Dentition in adequate repair. NECK: supple without masses, thyromegaly or bruits. CHEST/PULM:  Clear to auscultation and percussion breath sounds equal no wheeze , rales or rhonchi. No chest wall deformities or tenderness. Breast: normal by inspection . No dimpling, discharge, masses, tenderness or discharge . CV: PMI is nondisplaced, S1 S2 no gallops, murmurs, rubs. Peripheral pulses are full without delay.No JVD .  ABDOMEN: Bowel sounds normal nontender  No guard or rebound, no hepato splenomegal no CVA tenderness.  No hernia. Extremtities:  No clubbing cyanosis or edema, no acute joint swelling or redness no focal atrophy NEURO:  Oriented x3, cranial nerves 3-12 appear to be intact, no obvious focal weakness,gait within normal limits no abnormal reflexes or asymmetrical SKIN: No acute rashes normal turgor, color, no bruising or petechiae. PSYCH: Oriented, good eye contact, no obvious depression anxiety, cognition and judgment appear normal. LN: no cervical axillary inguinal adenopathy Pelvic: NL ext GU, labia clear without lesions or rash . Vagina no lesions some atrophy.Cervix: clear exterior only partially visualized  UTERUS: Neg CMT Adnexa:  clear no masses .  PAP done HPV screen rectal no masses stool heme negative  Lab Results  Component Value Date   WBC 5.3 10/07/2012   HGB 12.9 10/07/2012   HCT 38.6 10/07/2012   PLT 197.0 10/07/2012   GLUCOSE 87 10/07/2012   CHOL 237* 10/07/2012   TRIG 86.0 10/07/2012   HDL 60.50 10/07/2012   LDLDIRECT 159.2 10/07/2012   LDLCALC 80 09/23/2011  ALT 36* 10/07/2012   AST 26 10/07/2012   NA 139 10/07/2012   K 3.8 10/07/2012   CL 104 10/07/2012   CREATININE 0.6 10/07/2012   BUN 22 10/07/2012   CO2 26 10/07/2012   TSH 0.71 10/07/2012    ASSESSMENT AND PLAN:  Discussed the following assessment and plan:  Visit for preventive health examination - Plan: PAP [[place]]  Hyperlipidemia - Elevated LDL off statin we'll try 5 mg every other day of Crestor otherwise we can switch to Pravachol discussed possibility of being off altogether   Hypertension - Continue  LFTs abnormal - Minor abnormalities are noted she does take ibuprofen a good bit we'll repeat in 6-8 weeks with hepatitis C antibody and repeat lipids.  Encounter for routine gynecological examination - Plan: PAP [[place]]  Asthma - By history quite stable rare rescue medicine use. Works daily in a barn  Adverse drug effect, initial encounter - 2 low-dose Crestor can try every other day if not switch to Pravachol  Patient Care Team: Madelin Headings, MD as PCP - General (Internal Medicine) Louis Meckel, MD (Gastroenterology) Fran Lowes., MD (Dermatology) Patient Instructions  Try 5 mg  crestor every other day and if  No muscle aches we can recheck lipid and lfts etc in about 6-8 weeks.  Will also add hep c aby  If not already done.  If getting m,uscel aches  then contact us and we can try pravachol  Therapy  And then check lipids.  Check into cost for zostavax vaccine.     Preventive Care for Adults, Female A healthy lifestyle and preventive care can promote health and wellness. Preventive health guidelines for women include the  following key practices.  A routine yearly physical is a good way to check with your caregiver about your health and preventive screening. It is a chance to share any concerns and updates on your health, and to receive a thorough exam.  Visit your dentist for a routine exam and preventive care every 6 months. Brush your teeth twice a day and floss once a day. Good oral hygiene prevents tooth decay and gum disease.  The frequency of eye exams is based on your age, health, family medical history, use of contact lenses, and other factors. Follow your caregiver's recommendations for frequency of eye exams.  Eat a healthy diet. Foods like vegetables, fruits, whole grains, low-fat dairy products, and lean protein foods contain the nutrients you need without too many calories. Decrease your intake of foods high in solid fats, added sugars, and salt. Eat the right amount of calories for you.Get information about a proper diet from your caregiver, if necessary.  Regular physical exercise is one of the most important things you can do for your health. Most adults should get at least 150 minutes of moderate-intensity exercise (any activity that increases your heart rate and causes you to sweat) each week. In addition, most adults need muscle-strengthening exercises on 2 or more days a week.  Maintain a healthy weight. The body mass index (BMI) is a screening tool to identify possible weight problems. It provides an estimate of body fat based on height and weight. Your caregiver can help determine your BMI, and can help you achieve or maintain a healthy weight.For adults 20 years and older:  A BMI below 18.5 is considered underweight.  A BMI of 18.5 to 24.9 is normal.  A BMI of 25 to 29.9 is considered overweight.  A BMI of  30 and above is considered obese.  Maintain normal blood lipids and cholesterol levels by exercising and minimizing your intake of saturated fat. Eat a balanced diet with plenty of  fruit and vegetables. Blood tests for lipids and cholesterol should begin at age 57 and be repeated every 5 years. If your lipid or cholesterol levels are high, you are over 50, or you are at high risk for heart disease, you may need your cholesterol levels checked more frequently.Ongoing high lipid and cholesterol levels should be treated with medicines if diet and exercise are not effective.  If you smoke, find out from your caregiver how to quit. If you do not use tobacco, do not start.  If you are pregnant, do not drink alcohol. If you are breastfeeding, be very cautious about drinking alcohol. If you are not pregnant and choose to drink alcohol, do not exceed 1 drink per day. One drink is considered to be 12 ounces (355 mL) of beer, 5 ounces (148 mL) of wine, or 1.5 ounces (44 mL) of liquor.  Avoid use of street drugs. Do not share needles with anyone. Ask for help if you need support or instructions about stopping the use of drugs.  High blood pressure causes heart disease and increases the risk of stroke. Your blood pressure should be checked at least every 1 to 2 years. Ongoing high blood pressure should be treated with medicines if weight loss and exercise are not effective.  If you are 13 to 64 years old, ask your caregiver if you should take aspirin to prevent strokes.  Diabetes screening involves taking a blood sample to check your fasting blood sugar level. This should be done once every 3 years, after age 67, if you are within normal weight and without risk factors for diabetes. Testing should be considered at a younger age or be carried out more frequently if you are overweight and have at least 1 risk factor for diabetes.  Breast cancer screening is essential preventive care for women. You should practice "breast self-awareness." This means understanding the normal appearance and feel of your breasts and may include breast self-examination. Any changes detected, no matter how small,  should be reported to a caregiver. Women in their 77s and 30s should have a clinical breast exam (CBE) by a caregiver as part of a regular health exam every 1 to 3 years. After age 44, women should have a CBE every year. Starting at age 20, women should consider having a mammography (breast X-ray test) every year. Women who have a family history of breast cancer should talk to their caregiver about genetic screening. Women at a high risk of breast cancer should talk to their caregivers about having magnetic resonance imaging (MRI) and a mammography every year.  The Pap test is a screening test for cervical cancer. A Pap test can show cell changes on the cervix that might become cervical cancer if left untreated. A Pap test is a procedure in which cells are obtained and examined from the lower end of the uterus (cervix).  Women should have a Pap test starting at age 38.  Between ages 28 and 42, Pap tests should be repeated every 2 years.  Beginning at age 44, you should have a Pap test every 3 years as long as the past 3 Pap tests have been normal.  Some women have medical problems that increase the chance of getting cervical cancer. Talk to your caregiver about these problems. It is especially important to  talk to your caregiver if a new problem develops soon after your last Pap test. In these cases, your caregiver may recommend more frequent screening and Pap tests.  The above recommendations are the same for women who have or have not gotten the vaccine for human papillomavirus (HPV).  If you had a hysterectomy for a problem that was not cancer or a condition that could lead to cancer, then you no longer need Pap tests. Even if you no longer need a Pap test, a regular exam is a good idea to make sure no other problems are starting.  If you are between ages 88 and 70, and you have had normal Pap tests going back 10 years, you no longer need Pap tests. Even if you no longer need a Pap test, a regular  exam is a good idea to make sure no other problems are starting.  If you have had past treatment for cervical cancer or a condition that could lead to cancer, you need Pap tests and screening for cancer for at least 20 years after your treatment.  If Pap tests have been discontinued, risk factors (such as a new sexual partner) need to be reassessed to determine if screening should be resumed.  The HPV test is an additional test that may be used for cervical cancer screening. The HPV test looks for the virus that can cause the cell changes on the cervix. The cells collected during the Pap test can be tested for HPV. The HPV test could be used to screen women aged 52 years and older, and should be used in women of any age who have unclear Pap test results. After the age of 56, women should have HPV testing at the same frequency as a Pap test.  Colorectal cancer can be detected and often prevented. Most routine colorectal cancer screening begins at the age of 3 and continues through age 33. However, your caregiver may recommend screening at an earlier age if you have risk factors for colon cancer. On a yearly basis, your caregiver may provide home test kits to check for hidden blood in the stool. Use of a small camera at the end of a tube, to directly examine the colon (sigmoidoscopy or colonoscopy), can detect the earliest forms of colorectal cancer. Talk to your caregiver about this at age 69, when routine screening begins. Direct examination of the colon should be repeated every 5 to 10 years through age 63, unless early forms of pre-cancerous polyps or small growths are found.  Hepatitis C blood testing is recommended for all people born from [birth year] through 1965 and any individual with known risks for hepatitis C.  Practice safe sex. Use condoms and avoid high-risk sexual practices to reduce the spread of sexually transmitted infections (STIs). STIs include gonorrhea, chlamydia, syphilis, trichomonas,  herpes, HPV, and human immunodeficiency virus (HIV). Herpes, HIV, and HPV are viral illnesses that have no cure. They can result in disability, cancer, and death. Sexually active women aged 61 and younger should be checked for chlamydia. Older women with new or multiple partners should also be tested for chlamydia. Testing for other STIs is recommended if you are sexually active and at increased risk.  Osteoporosis is a disease in which the bones lose minerals and strength with aging. This can result in serious bone fractures. The risk of osteoporosis can be identified using a bone density scan. Women ages 70 and over and women at risk for fractures or osteoporosis should discuss screening with  their caregivers. Ask your caregiver whether you should take a calcium supplement or vitamin D to reduce the rate of osteoporosis.  Menopause can be associated with physical symptoms and risks. Hormone replacement therapy is available to decrease symptoms and risks. You should talk to your caregiver about whether hormone replacement therapy is right for you.  Use sunscreen with sun protection factor (SPF) of 30 or more. Apply sunscreen liberally and repeatedly throughout the day. You should seek shade when your shadow is shorter than you. Protect yourself by wearing long sleeves, pants, a wide-brimmed hat, and sunglasses year round, whenever you are outdoors.  Once a month, do a whole body skin exam, using a mirror to look at the skin on your back. Notify your caregiver of new moles, moles that have irregular borders, moles that are larger than a pencil eraser, or moles that have changed in shape or color.  Stay current with required immunizations.  Influenza. You need a dose every fall (or winter). The composition of the flu vaccine changes each year, so being vaccinated once is not enough.  Pneumococcal polysaccharide. You need 1 to 2 doses if you smoke cigarettes or if you have certain chronic medical  conditions. You need 1 dose at age 74 (or older) if you have never been vaccinated.  Tetanus, diphtheria, pertussis (Tdap, Td). Get 1 dose of Tdap vaccine if you are younger than age 87, are over 62 and have contact with an infant, are a Research scientist (physical sciences), are pregnant, or simply want to be protected from whooping cough. After that, you need a Td booster dose every 10 years. Consult your caregiver if you have not had at least 3 tetanus and diphtheria-containing shots sometime in your life or have a deep or dirty wound.  HPV. You need this vaccine if you are a woman age 20 or younger. The vaccine is given in 3 doses over 6 months.  Measles, mumps, rubella (MMR). You need at least 1 dose of MMR if you were born in 1957 or later. You may also need a second dose.  Meningococcal. If you are age 54 to 11 and a first-year college student living in a residence hall, or have one of several medical conditions, you need to get vaccinated against meningococcal disease. You may also need additional booster doses.  Zoster (shingles). If you are age 25 or older, you should get this vaccine.  Varicella (chickenpox). If you have never had chickenpox or you were vaccinated but received only 1 dose, talk to your caregiver to find out if you need this vaccine.  Hepatitis A. You need this vaccine if you have a specific risk factor for hepatitis A virus infection or you simply wish to be protected from this disease. The vaccine is usually given as 2 doses, 6 to 18 months apart.  Hepatitis B. You need this vaccine if you have a specific risk factor for hepatitis B virus infection or you simply wish to be protected from this disease. The vaccine is given in 3 doses, usually over 6 months. Preventive Services / Frequency Ages 52 to 72  Blood pressure check.** / Every 1 to 2 years.  Lipid and cholesterol check.** / Every 5 years beginning at age 54.  Clinical breast exam.** / Every 3 years for women in their 44s and  30s.  Pap test.** / Every 2 years from ages 108 through 36. Every 3 years starting at age 57 through age 54 or 35 with a history of 3  consecutive normal Pap tests.  HPV screening.** / Every 3 years from ages 65 through ages 79 to 78 with a history of 3 consecutive normal Pap tests.  Hepatitis C blood test.** / For any individual with known risks for hepatitis C.  Skin self-exam. / Monthly.  Influenza immunization.** / Every year.  Pneumococcal polysaccharide immunization.** / 1 to 2 doses if you smoke cigarettes or if you have certain chronic medical conditions.  Tetanus, diphtheria, pertussis (Tdap, Td) immunization. / A one-time dose of Tdap vaccine. After that, you need a Td booster dose every 10 years.  HPV immunization. / 3 doses over 6 months, if you are 35 and younger.  Measles, mumps, rubella (MMR) immunization. / You need at least 1 dose of MMR if you were born in 1957 or later. You may also need a second dose.  Meningococcal immunization. / 1 dose if you are age 24 to 83 and a first-year college student living in a residence hall, or have one of several medical conditions, you need to get vaccinated against meningococcal disease. You may also need additional booster doses.  Varicella immunization.** / Consult your caregiver.  Hepatitis A immunization.** / Consult your caregiver. 2 doses, 6 to 18 months apart.  Hepatitis B immunization.** / Consult your caregiver. 3 doses usually over 6 months. Ages 55 to 30  Blood pressure check.** / Every 1 to 2 years.  Lipid and cholesterol check.** / Every 5 years beginning at age 52.  Clinical breast exam.** / Every year after age 91.  Mammogram.** / Every year beginning at age 33 and continuing for as long as you are in good health. Consult with your caregiver.  Pap test.** / Every 3 years starting at age 32 through age 59 or 26 with a history of 3 consecutive normal Pap tests.  HPV screening.** / Every 3 years from ages 59  through ages 56 to 64 with a history of 3 consecutive normal Pap tests.  Fecal occult blood test (FOBT) of stool. / Every year beginning at age 37 and continuing until age 38. You may not need to do this test if you get a colonoscopy every 10 years.  Flexible sigmoidoscopy or colonoscopy.** / Every 5 years for a flexible sigmoidoscopy or every 10 years for a colonoscopy beginning at age 64 and continuing until age 43.  Hepatitis C blood test.** / For all people born from [birth year] through 1965 and any individual with known risks for hepatitis C.  Skin self-exam. / Monthly.  Influenza immunization.** / Every year.  Pneumococcal polysaccharide immunization.** / 1 to 2 doses if you smoke cigarettes or if you have certain chronic medical conditions.  Tetanus, diphtheria, pertussis (Tdap, Td) immunization.** / A one-time dose of Tdap vaccine. After that, you need a Td booster dose every 10 years.  Measles, mumps, rubella (MMR) immunization. / You need at least 1 dose of MMR if you were born in 1957 or later. You may also need a second dose.  Varicella immunization.** / Consult your caregiver.  Meningococcal immunization.** / Consult your caregiver.  Hepatitis A immunization.** / Consult your caregiver. 2 doses, 6 to 18 months apart.  Hepatitis B immunization.** / Consult your caregiver. 3 doses, usually over 6 months. Ages 79 and over  Blood pressure check.** / Every 1 to 2 years.  Lipid and cholesterol check.** / Every 5 years beginning at age 29.  Clinical breast exam.** / Every year after age 20.  Mammogram.** / Every year beginning at age  40 and continuing for as long as you are in good health. Consult with your caregiver.  Pap test.** / Every 3 years starting at age 26 through age 76 or 17 with a 3 consecutive normal Pap tests. Testing can be stopped between 65 and 70 with 3 consecutive normal Pap tests and no abnormal Pap or HPV tests in the past 10 years.  HPV screening.** / Every 3  years from ages 83 through ages 38 or 59 with a history of 3 consecutive normal Pap tests. Testing can be stopped between 65 and 70 with 3 consecutive normal Pap tests and no abnormal Pap or HPV tests in the past 10 years.  Fecal occult blood test (FOBT) of stool. / Every year beginning at age 44 and continuing until age 60. You may not need to do this test if you get a colonoscopy every 10 years.  Flexible sigmoidoscopy or colonoscopy.** / Every 5 years for a flexible sigmoidoscopy or every 10 years for a colonoscopy beginning at age 78 and continuing until age 34.  Hepatitis C blood test.** / For all people born from [birth year] through 1965 and any individual with known risks for hepatitis C.  Osteoporosis screening.** / A one-time screening for women ages 65 and over and women at risk for fractures or osteoporosis.  Skin self-exam. / Monthly.  Influenza immunization.** / Every year.  Pneumococcal polysaccharide immunization.** / 1 dose at age 75 (or older) if you have never been vaccinated.  Tetanus, diphtheria, pertussis (Tdap, Td) immunization. / A one-time dose of Tdap vaccine if you are over 65 and have contact with an infant, are a Research scientist (physical sciences), or simply want to be protected from whooping cough. After that, you need a Td booster dose every 10 years.  Varicella immunization.** / Consult your caregiver.  Meningococcal immunization.** / Consult your caregiver.  Hepatitis A immunization.** / Consult your caregiver. 2 doses, 6 to 18 months apart.  Hepatitis B immunization.** / Check with your caregiver. 3 doses, usually over 6 months. ** Family history and personal history of risk and conditions may change your caregiver's recommendations. Document Released: 07/28/2001 Document Revised: 08/24/2011 Document Reviewed: 10/27/2010 [facility] Patient Information 2013 [place], [place].   Continue lifestyle intervention healthy eating and exercise .     Neta Mends. Eduardo Honor M.D. Health  Maintenance  Topic Date Due  . Zostavax  12/06/2008  . Influenza Vaccine  02/13/2013  . Pap Smear  05/15/2013  . Mammogram  09/07/2014  . Tetanus/tdap  06/15/2018  . Colonoscopy  03/12/2021   Health Maintenance Review

## 2012-10-17 ENCOUNTER — Other Ambulatory Visit: Payer: Self-pay | Admitting: Family Medicine

## 2012-10-17 MED ORDER — LANSOPRAZOLE 30 MG PO CPDR: 30.0000 mg | DELAYED_RELEASE_CAPSULE | Freq: Every day | ORAL | Status: DC

## 2012-10-17 MED ORDER — CLONIDINE HCL 0.1 MG PO TABS: 0.1000 mg | ORAL_TABLET | Freq: Every day | ORAL | Status: DC

## 2012-10-17 MED ORDER — MONTELUKAST SODIUM 10 MG PO TABS: 10.0000 mg | ORAL_TABLET | ORAL | Status: DC

## 2012-10-17 MED ORDER — HYDROCHLOROTHIAZIDE 25 MG PO TABS: 25.0000 mg | ORAL_TABLET | Freq: Every day | ORAL | Status: DC

## 2012-10-17 MED ORDER — FLUTICASONE PROPIONATE HFA 110 MCG/ACT IN AERO: 1.0000 | INHALATION_SPRAY | Freq: Two times a day (BID) | RESPIRATORY_TRACT | Status: DC

## 2012-10-17 MED ORDER — IPRATROPIUM BROMIDE HFA 17 MCG/ACT IN AERS: 2.0000 | INHALATION_SPRAY | Freq: Four times a day (QID) | RESPIRATORY_TRACT | Status: DC

## 2012-10-17 MED ORDER — QUINAPRIL HCL 20 MG PO TABS: 20.0000 mg | ORAL_TABLET | Freq: Every day | ORAL | Status: DC

## 2012-10-17 MED ORDER — THEOPHYLLINE ER 300 MG PO TB12: 300.0000 mg | ORAL_TABLET | Freq: Every day | ORAL | Status: DC

## 2012-10-20 ENCOUNTER — Encounter: Payer: Self-pay | Admitting: Family Medicine

## 2012-10-20 NOTE — Progress Notes (Signed)
Quick Note:  Tell patient PAP is normal. And negative HPV screen ______

## 2012-12-02 ENCOUNTER — Other Ambulatory Visit (INDEPENDENT_AMBULATORY_CARE_PROVIDER_SITE_OTHER): Payer: BC Managed Care – PPO

## 2012-12-02 DIAGNOSIS — J45909 Unspecified asthma, uncomplicated: Secondary | ICD-10-CM

## 2012-12-02 DIAGNOSIS — T50905A Adverse effect of unspecified drugs, medicaments and biological substances, initial encounter: Secondary | ICD-10-CM

## 2012-12-02 DIAGNOSIS — Z0000 Encounter for general adult medical examination without abnormal findings: Secondary | ICD-10-CM

## 2012-12-02 DIAGNOSIS — R945 Abnormal results of liver function studies: Secondary | ICD-10-CM

## 2012-12-02 DIAGNOSIS — Z01419 Encounter for gynecological examination (general) (routine) without abnormal findings: Secondary | ICD-10-CM

## 2012-12-02 DIAGNOSIS — I10 Essential (primary) hypertension: Secondary | ICD-10-CM

## 2012-12-02 DIAGNOSIS — T887XXA Unspecified adverse effect of drug or medicament, initial encounter: Secondary | ICD-10-CM

## 2012-12-02 DIAGNOSIS — E785 Hyperlipidemia, unspecified: Secondary | ICD-10-CM

## 2012-12-02 DIAGNOSIS — R7989 Other specified abnormal findings of blood chemistry: Secondary | ICD-10-CM

## 2012-12-02 LAB — HEPATIC FUNCTION PANEL
ALT: 39 U/L — ABNORMAL HIGH (ref 0–35)
AST: 28 U/L (ref 0–37)
Albumin: 4 g/dL (ref 3.5–5.2)

## 2012-12-02 LAB — FERRITIN: Ferritin: 8.1 ng/mL — ABNORMAL LOW (ref 10.0–291.0)

## 2012-12-02 LAB — LIPID PANEL
Cholesterol: 155 mg/dL (ref 0–200)
VLDL: 10.2 mg/dL (ref 0.0–40.0)

## 2012-12-03 LAB — HEPATITIS B SURFACE ANTIGEN: Hepatitis B Surface Ag: NEGATIVE

## 2012-12-06 LAB — HEPATITIS B SURFACE ANTIBODY,QUALITATIVE: Hep B S Ab: NONREACTIVE

## 2012-12-22 NOTE — Progress Notes (Signed)
Quick Note:  I tried to reach pt, no answer. ______

## 2012-12-26 ENCOUNTER — Telehealth: Payer: Self-pay | Admitting: Family Medicine

## 2012-12-26 NOTE — Telephone Encounter (Signed)
I spoke with pt and went over lab results.  

## 2012-12-26 NOTE — Progress Notes (Signed)
Quick Note:  I left voice message for pt to return my call. ______ 

## 2013-04-20 ENCOUNTER — Other Ambulatory Visit: Payer: Self-pay

## 2013-04-24 ENCOUNTER — Other Ambulatory Visit: Payer: Self-pay | Admitting: Internal Medicine

## 2013-04-24 MED ORDER — ALBUTEROL SULFATE HFA 108 (90 BASE) MCG/ACT IN AERS: 2.0000 | INHALATION_SPRAY | Freq: Four times a day (QID) | RESPIRATORY_TRACT | Status: DC | PRN

## 2013-09-24 ENCOUNTER — Encounter: Payer: Self-pay | Admitting: Internal Medicine

## 2013-09-25 ENCOUNTER — Other Ambulatory Visit: Payer: Self-pay | Admitting: Internal Medicine

## 2013-09-25 DIAGNOSIS — Z1231 Encounter for screening mammogram for malignant neoplasm of breast: Secondary | ICD-10-CM

## 2013-09-25 NOTE — Telephone Encounter (Signed)
Pt needs a cpx after 5/1, nothing available until August.  Please advise on where patient can be scheduled.  Thanks.

## 2013-09-26 NOTE — Telephone Encounter (Signed)
There is plenty of room to do this at patient convenience  On Wednesday s how about May 6th??

## 2013-10-03 ENCOUNTER — Ambulatory Visit (HOSPITAL_COMMUNITY)
Admission: RE | Admit: 2013-10-03 | Discharge: 2013-10-03 | Disposition: A | Discharge status: Home / Self Care | Payer: BC Managed Care – PPO | Source: Ambulatory Visit | Attending: Internal Medicine | Admitting: Internal Medicine

## 2013-10-03 DIAGNOSIS — Z1231 Encounter for screening mammogram for malignant neoplasm of breast: Secondary | ICD-10-CM | POA: Insufficient documentation

## 2013-10-06 ENCOUNTER — Other Ambulatory Visit: Payer: Self-pay | Admitting: Internal Medicine

## 2013-10-06 DIAGNOSIS — R928 Other abnormal and inconclusive findings on diagnostic imaging of breast: Secondary | ICD-10-CM

## 2013-10-06 NOTE — Telephone Encounter (Signed)
Pt has an appointment for 10/10/13 for labs at 10:00 and 10/16/13 for phy at 1:45 with Dr. Fabian SharpPanosh.

## 2013-10-10 ENCOUNTER — Other Ambulatory Visit (INDEPENDENT_AMBULATORY_CARE_PROVIDER_SITE_OTHER): Payer: BC Managed Care – PPO

## 2013-10-10 DIAGNOSIS — Z0000 Encounter for general adult medical examination without abnormal findings: Secondary | ICD-10-CM

## 2013-10-10 LAB — BASIC METABOLIC PANEL
BUN: 20 mg/dL (ref 6–23)
CALCIUM: 9.4 mg/dL (ref 8.4–10.5)
CO2: 27 meq/L (ref 19–32)
CREATININE: 0.5 mg/dL (ref 0.4–1.2)
Chloride: 102 mEq/L (ref 96–112)
GFR: 123.11 mL/min (ref >60.00)
GLUCOSE: 95 mg/dL (ref 70–99)
Potassium: 3.5 mEq/L (ref 3.5–5.1)
SODIUM: 138 meq/L (ref 135–145)

## 2013-10-10 LAB — CBC WITH DIFFERENTIAL/PLATELET
Basophils Absolute: 0 10*3/uL (ref 0.0–0.1)
Basophils Relative: 0.9 % (ref 0.0–3.0)
EOS ABS: 0.1 10*3/uL (ref 0.0–0.7)
Eosinophils Relative: 2 % (ref 0.0–5.0)
HCT: 38.2 % (ref 36.0–46.0)
Hemoglobin: 12.8 g/dL (ref 12.0–15.0)
LYMPHS PCT: 21.6 % (ref 12.0–46.0)
Lymphs Abs: 1 10*3/uL (ref 0.7–4.0)
MCHC: 33.4 g/dL (ref 30.0–36.0)
MCV: 80 fl (ref 78.0–100.0)
Monocytes Absolute: 0.4 10*3/uL (ref 0.1–1.0)
Monocytes Relative: 8 % (ref 3.0–12.0)
NEUTROS PCT: 67.5 % (ref 43.0–77.0)
Neutro Abs: 3.3 10*3/uL (ref 1.4–7.7)
Platelets: 216 10*3/uL (ref 150.0–400.0)
RBC: 4.78 Mil/uL (ref 3.87–5.11)
RDW: 15.1 % — AB (ref 11.5–14.6)
WBC: 4.8 10*3/uL (ref 4.5–10.5)

## 2013-10-10 LAB — HEPATIC FUNCTION PANEL
ALBUMIN: 4.1 g/dL (ref 3.5–5.2)
ALK PHOS: 65 U/L (ref 39–117)
ALT: 20 U/L (ref 0–35)
AST: 19 U/L (ref 0–37)
BILIRUBIN DIRECT: 0.1 mg/dL (ref 0.0–0.3)
BILIRUBIN TOTAL: 0.5 mg/dL (ref 0.3–1.2)
Total Protein: 7.2 g/dL (ref 6.0–8.3)

## 2013-10-10 LAB — LIPID PANEL
CHOL/HDL RATIO: 3
Cholesterol: 154 mg/dL (ref 0–200)
HDL: 60.9 mg/dL (ref >39.00)
LDL Cholesterol: 78 mg/dL (ref 0–99)
Triglycerides: 75 mg/dL (ref 0.0–149.0)
VLDL: 15 mg/dL (ref 0.0–40.0)

## 2013-10-10 LAB — TSH: TSH: 0.76 u[IU]/mL (ref 0.35–5.50)

## 2013-10-16 ENCOUNTER — Encounter: Payer: Self-pay | Admitting: Internal Medicine

## 2013-10-16 ENCOUNTER — Ambulatory Visit (INDEPENDENT_AMBULATORY_CARE_PROVIDER_SITE_OTHER): Payer: BC Managed Care – PPO | Admitting: Internal Medicine

## 2013-10-16 VITALS — BP 136/86 | HR 79 | Temp 98.4°F | Ht 64.25 in | Wt 181.0 lb

## 2013-10-16 DIAGNOSIS — I10 Essential (primary) hypertension: Secondary | ICD-10-CM

## 2013-10-16 DIAGNOSIS — Z23 Encounter for immunization: Secondary | ICD-10-CM

## 2013-10-16 DIAGNOSIS — E785 Hyperlipidemia, unspecified: Secondary | ICD-10-CM

## 2013-10-16 DIAGNOSIS — J45909 Unspecified asthma, uncomplicated: Secondary | ICD-10-CM

## 2013-10-16 DIAGNOSIS — L989 Disorder of the skin and subcutaneous tissue, unspecified: Secondary | ICD-10-CM | POA: Insufficient documentation

## 2013-10-16 DIAGNOSIS — K219 Gastro-esophageal reflux disease without esophagitis: Secondary | ICD-10-CM

## 2013-10-16 DIAGNOSIS — Z0000 Encounter for general adult medical examination without abnormal findings: Secondary | ICD-10-CM

## 2013-10-16 MED ORDER — CLONIDINE HCL 0.1 MG PO TABS: 0.1000 mg | ORAL_TABLET | Freq: Every day | ORAL | Status: DC

## 2013-10-16 MED ORDER — FLUTICASONE PROPIONATE HFA 110 MCG/ACT IN AERO: 1.0000 | INHALATION_SPRAY | Freq: Two times a day (BID) | RESPIRATORY_TRACT | Status: DC

## 2013-10-16 MED ORDER — QUINAPRIL HCL 20 MG PO TABS: 20.0000 mg | ORAL_TABLET | Freq: Every day | ORAL | Status: DC

## 2013-10-16 MED ORDER — THEOPHYLLINE ER 300 MG PO TB12: 300.0000 mg | ORAL_TABLET | Freq: Every day | ORAL | Status: DC

## 2013-10-16 MED ORDER — HYDROCHLOROTHIAZIDE 25 MG PO TABS: 25.0000 mg | ORAL_TABLET | Freq: Every day | ORAL | Status: DC

## 2013-10-16 MED ORDER — CYCLOBENZAPRINE HCL 5 MG PO TABS: 5.0000 mg | ORAL_TABLET | Freq: Three times a day (TID) | ORAL | Status: DC | PRN

## 2013-10-16 MED ORDER — MONTELUKAST SODIUM 10 MG PO TABS: 10.0000 mg | ORAL_TABLET | ORAL | Status: DC

## 2013-10-16 MED ORDER — LANSOPRAZOLE 30 MG PO CPDR: 30.0000 mg | DELAYED_RELEASE_CAPSULE | Freq: Every day | ORAL | Status: DC

## 2013-10-16 MED ORDER — ALBUTEROL SULFATE HFA 108 (90 BASE) MCG/ACT IN AERS: 2.0000 | INHALATION_SPRAY | Freq: Four times a day (QID) | RESPIRATORY_TRACT | Status: DC | PRN

## 2013-10-16 NOTE — Patient Instructions (Addendum)
Derm referral. For now ? What to do for medicare transition.  Check at each stage of appt.  Ok to use muscle relaxant occassionally caution with this.  Goal blood pressure is  Below   140/90 .   Check into shingles vaccine ( Zostavax) reimbursement or cost to you  and can return at any time if call ahead for injection. But some medicare plans don't pay for this.  Continue lifestyle intervention healthy eating and exercise . 150 minutes of exercise weeks  ,  Lose weightathealthy levels. Avoid trans fats and processed foods;  Increase fresh fruits and veges to 5 servings per day. And avoid sweet beverages  Including tea and juice.  cpx  In 1 year

## 2013-10-16 NOTE — Progress Notes (Signed)
Chief Complaint  Patient presents with  . Annual Exam  . Hyperlipidemia  . Hypertension  . Asthma    HPI: Patient comes in today for Preventive Health Care visit  No major change in health status since last visit .  BP usually 120 - 140.   / 70 - 80  No se of meds needs refill  Taking vit d .   Lipids taking crestor about every other day.  220.  At initiation?  Right scaly area. righ temple had pre cancer on nose removed a while back ? If the same ? Rx.   Asks for mild musc relaxant for prn use  Works hard with horses outside work  No sp injury today.  Asthma stable  Still on theophylline  No flares of asthma recently   GERD continues med   Health Maintenance  Topic Date Due  . Zostavax  12/06/2008  . Influenza Vaccine  01/13/2014  . Mammogram  10/04/2015  . Pap Smear  10/15/2015  . Tetanus/tdap  06/15/2018  . Colonoscopy  03/12/2021   Health Maintenance Review   ROS:  GEN/ HEENT: No fever, significant weight changes sweats headaches vision problems hearing changes, CV/ PULM; No chest pain shortness of breath cough, syncope,edema  change in exercise tolerance. GI /GU: No adominal pain, vomiting, change in bowel habits. No blood in the stool. No significant GU symptoms. SKIN/HEME: ,no  or bleeding. No lymphadenopathy, nodules, masses.  NEURO/ PSYCH:  No neurologic signs such as weakness numbness. No depression anxiety. IMM/ Allergy: No unusual infections.  Allergy .   REST of 12 system review negative except as per HPI   Past Medical History  Diagnosis Date  . Asthma     as a child and then attack 1996  poss allergic to cat hair. ventilated x 2 . saw Dr [place] CallasSharma then .   Marland Kitchen. GERD (gastroesophageal reflux disease)     2000 taking otc prevacid   evey day.   . Hypertension      3 meds controlled  . Hyperlipidemia   . Kidney stone     10 - 15 years     Family History  Problem Relation Age of Onset  . Breast cancer Mother   . Cancer Mother     bladder  .  Uterine cancer Sister   . Colon cancer Neg Hx   . Alcohol abuse Father     History   Social History  . Marital Status: Single    Spouse Name: N/A    Number of Children: N/A  . Years of Education: N/A   Social History Main Topics  . Smoking status: Former Smoker    Quit date: 06/21/1986  . Smokeless tobacco: Never Used  . Alcohol Use: Yes     Comment: socially- once a month  . Drug Use: No  . Sexual Activity: None   Other Topics Concern  . None   Social History Narrative   [place]      In [place]  Has family hsere   Works 45 per week [employer]    And auto auction 2 nights per week.  Horticulturist, commercialDealer register  .    HS education  single   6 hours of sleep   Hh  Of 1  Pet dog an horse.    Remote tobacco  1-2 ppd stopped 1988    Etoh couple times a month rare    ocass  Diet soda.  Outpatient Encounter Prescriptions as of 10/16/2013  Medication Sig  . albuterol (PROVENTIL HFA) 108 (90 BASE) MCG/ACT inhaler Inhale 2 puffs into the lungs every 6 (six) hours as needed.  . cholecalciferol (VITAMIN D) 1000 UNITS tablet Take 1,000 Units by mouth daily.    . cloNIDine (CATAPRES) 0.1 MG tablet Take 1 tablet (0.1 mg total) by mouth daily.  . fluticasone (FLOVENT HFA) 110 MCG/ACT inhaler Inhale 1 puff into the lungs 2 (two) times daily.  . hydrochlorothiazide (HYDRODIURIL) 25 MG tablet Take 1 tablet (25 mg total) by mouth daily.  Marland Kitchen. ipratropium (ATROVENT HFA) 17 MCG/ACT inhaler Inhale 2 puffs into the lungs every 6 (six) hours.  . lansoprazole (PREVACID) 30 MG capsule Take 1 capsule (30 mg total) by mouth daily.  . montelukast (SINGULAIR) 10 MG tablet Take 1 tablet (10 mg total) by mouth every morning.  . Multiple Vitamins-Minerals (MULTIVITAMIN WITH MINERALS) tablet Take 1 tablet by mouth daily.    . quinapril (ACCUPRIL) 20 MG tablet Take 1 tablet (20 mg total) by mouth daily.  . rosuvastatin (CRESTOR) 10 MG tablet Take 1 tablet (10 mg total) by mouth daily. 1/2  daily  . theophylline (THEODUR) 300 MG 12 hr tablet Take 1 tablet (300 mg total) by mouth daily.  . [DISCONTINUED] albuterol (PROVENTIL HFA) 108 (90 BASE) MCG/ACT inhaler Inhale 2 puffs into the lungs every 6 (six) hours as needed.  . [DISCONTINUED] cloNIDine (CATAPRES) 0.1 MG tablet Take 1 tablet (0.1 mg total) by mouth daily.  . [DISCONTINUED] fluticasone (FLOVENT HFA) 110 MCG/ACT inhaler Inhale 1 puff into the lungs 2 (two) times daily.  . [DISCONTINUED] hydrochlorothiazide (HYDRODIURIL) 25 MG tablet Take 1 tablet (25 mg total) by mouth daily.  . [DISCONTINUED] lansoprazole (PREVACID) 30 MG capsule Take 1 capsule (30 mg total) by mouth daily.  . [DISCONTINUED] montelukast (SINGULAIR) 10 MG tablet Take 1 tablet (10 mg total) by mouth every morning.  . [DISCONTINUED] quinapril (ACCUPRIL) 20 MG tablet Take 1 tablet (20 mg total) by mouth daily.  . [DISCONTINUED] theophylline (THEODUR) 300 MG 12 hr tablet Take 1 tablet (300 mg total) by mouth daily.  . cyclobenzaprine (FLEXERIL) 5 MG tablet Take 1 tablet (5 mg total) by mouth 3 (three) times daily as needed for muscle spasms.    EXAM:  BP 136/86  Pulse 79  Temp(Src) 98.4 F (36.9 C) (Oral)  Ht 5' 4.25" (1.632 m)  Wt 181 lb (82.101 kg)  BMI 30.83 kg/m2  SpO2 96%  Body mass index is 30.83 kg/(m^2).  Physical Exam: Vital signs reviewed WUJ:[id number]:This is a well-developed well-nourished alert cooperative    who appearsr stated age in no acute distress.  HEENT: normocephalic atraumatic , Eyes: PERRL EOM's full, conjunctiva clear, Nares: paten,t no deformity discharge or tenderness., Ears: no deformity EAC's clear TMs with normal landmarks. Mouth: clear OP, no lesions, edema.  Moist mucous membranes. Dentition in adequate repair. NECK: supple without masses, thyromegaly or bruits. CHEST/PULM:  Clear to auscultation and percussion breath sounds equal no wheeze , rales or rhonchi. No chest wall deformities or tenderness. Breast: normal by inspection  . No dimpling, discharge, masses, tenderness or discharge . CV: PMI is nondisplaced, S1 S2 no gallops, murmurs, rubs. Peripheral pulses are full without delay.No JVD .  ABDOMEN: Bowel sounds normal nontender  No guard or rebound, no hepato splenomegal no CVA tenderness.  No hernia. Extremtities:  No clubbing cyanosis or edema, no acute joint swelling or redness no focal atrophy some vv no ulceration NEURO:  Oriented  x3, cranial nerves 3-12 appear to be intact, no obvious focal weakness,gait within normal limits no abnormal reflexes or asymmetrical SKIN: No acute rashes normal turgor, color, no bruising or petechiae. Right temple with 3 mm scaly irreg lesion  PSYCH: Oriented, good eye contact, no obvious depression anxiety, cognition and judgment appear normal. LN: no cervical axillary inguinal adenopathy  Lab Results  Component Value Date   WBC 4.8 10/10/2013   HGB 12.8 10/10/2013   HCT 38.2 10/10/2013   PLT 216.0 10/10/2013   GLUCOSE 95 10/10/2013   CHOL 154 10/10/2013   TRIG 75.0 10/10/2013   HDL 60.90 10/10/2013   LDLDIRECT 159.2 10/07/2012   LDLCALC 78 10/10/2013   ALT 20 10/10/2013   AST 19 10/10/2013   NA 138 10/10/2013   K 3.5 10/10/2013   CL 102 10/10/2013   CREATININE 0.5 10/10/2013   BUN 20 10/10/2013   CO2 27 10/10/2013   TSH 0.76 10/10/2013    ASSESSMENT AND PLAN:  Discussed the following assessment and plan:  Visit for preventive health examination - prevnar 13  Hypertension - controlled   Hyperlipidemia - on meds  Asthma - controlled  at present  GERD (gastroesophageal reflux disease)  Need for vaccination with 13-polyvalent pneumococcal conjugate vaccine - Plan: Pneumococcal conjugate vaccine 13-valent  Skin lesion poss AK  face. - dsic optinos hx of some get derm to see  - Plan: Ambulatory referral to Dermatology Ok to rx flexeril with caution for as needed use   Is physically active  Fall prevention Patient Care Team: Madelin Headings, MD as PCP - General (Internal  Medicine) Louis Meckel, MD (Gastroenterology) Mertha Finders., MD (Dermatology) Patient Instructions  Derm referral. For now ? What to do for medicare transition.  Check at each stage of appt.  Ok to use muscle relaxant occassionally caution with this.  Goal blood pressure is  Below   140/90 .   Check into shingles vaccine ( Zostavax) reimbursement or cost to you  and can return at any time if call ahead for injection. But some medicare plans don't pay for this.  Continue lifestyle intervention healthy eating and exercise . 150 minutes of exercise weeks  ,  Lose weightathealthy levels. Avoid trans fats and processed foods;  Increase fresh fruits and veges to 5 servings per day. And avoid sweet beverages  Including tea and juice.  cpx  In 1 year    Neta Mends. Susan Bleich M.D. Pre visit review using our clinic review tool, if applicable. No additional management support is needed unless otherwise documented below in the visit note.

## 2013-10-17 ENCOUNTER — Ambulatory Visit
Admission: RE | Admit: 2013-10-17 | Discharge: 2013-10-17 | Disposition: A | Discharge status: Home / Self Care | Payer: BC Managed Care – PPO | Source: Ambulatory Visit | Attending: Internal Medicine | Admitting: Internal Medicine

## 2013-10-17 ENCOUNTER — Telehealth: Payer: Self-pay | Admitting: Internal Medicine

## 2013-10-17 DIAGNOSIS — R928 Other abnormal and inconclusive findings on diagnostic imaging of breast: Secondary | ICD-10-CM

## 2013-10-17 NOTE — Telephone Encounter (Signed)
Relevant patient education assigned to patient using Emmi. ° °

## 2014-01-04 DIAGNOSIS — L819 Disorder of pigmentation, unspecified: Secondary | ICD-10-CM | POA: Diagnosis not present

## 2014-01-04 DIAGNOSIS — L821 Other seborrheic keratosis: Secondary | ICD-10-CM | POA: Diagnosis not present

## 2014-01-04 DIAGNOSIS — L570 Actinic keratosis: Secondary | ICD-10-CM | POA: Diagnosis not present

## 2014-09-25 ENCOUNTER — Other Ambulatory Visit: Payer: Self-pay

## 2014-09-25 DIAGNOSIS — Z1231 Encounter for screening mammogram for malignant neoplasm of breast: Secondary | ICD-10-CM

## 2014-10-16 ENCOUNTER — Other Ambulatory Visit (INDEPENDENT_AMBULATORY_CARE_PROVIDER_SITE_OTHER): Payer: Medicare Other

## 2014-10-16 DIAGNOSIS — Z0000 Encounter for general adult medical examination without abnormal findings: Secondary | ICD-10-CM

## 2014-10-16 DIAGNOSIS — E785 Hyperlipidemia, unspecified: Secondary | ICD-10-CM | POA: Diagnosis not present

## 2014-10-16 LAB — HEPATIC FUNCTION PANEL
ALBUMIN: 3.9 g/dL (ref 3.5–5.2)
ALK PHOS: 77 U/L (ref 39–117)
ALT: 24 U/L (ref 0–35)
AST: 19 U/L (ref 0–37)
BILIRUBIN TOTAL: 0.4 mg/dL (ref 0.2–1.2)
Bilirubin, Direct: 0.1 mg/dL (ref 0.0–0.3)
Total Protein: 7.1 g/dL (ref 6.0–8.3)

## 2014-10-16 LAB — TSH: TSH: 1.15 u[IU]/mL (ref 0.35–4.50)

## 2014-10-16 LAB — CBC WITH DIFFERENTIAL/PLATELET
BASOS ABS: 0 10*3/uL (ref 0.0–0.1)
Basophils Relative: 0.6 % (ref 0.0–3.0)
EOS ABS: 0.2 10*3/uL (ref 0.0–0.7)
Eosinophils Relative: 4.1 % (ref 0.0–5.0)
HCT: 38.2 % (ref 36.0–46.0)
HEMOGLOBIN: 12.9 g/dL (ref 12.0–15.0)
Lymphocytes Relative: 24.4 % (ref 12.0–46.0)
Lymphs Abs: 1.2 10*3/uL (ref 0.7–4.0)
MCHC: 33.7 g/dL (ref 30.0–36.0)
MCV: 79.4 fl (ref 78.0–100.0)
Monocytes Absolute: 0.4 10*3/uL (ref 0.1–1.0)
Monocytes Relative: 6.9 % (ref 3.0–12.0)
NEUTROS ABS: 3.3 10*3/uL (ref 1.4–7.7)
NEUTROS PCT: 64 % (ref 43.0–77.0)
Platelets: 206 10*3/uL (ref 150.0–400.0)
RBC: 4.8 Mil/uL (ref 3.87–5.11)
RDW: 15.5 % (ref 11.5–15.5)
WBC: 5.1 10*3/uL (ref 4.0–10.5)

## 2014-10-16 LAB — LIPID PANEL
Cholesterol: 174 mg/dL (ref 0–200)
HDL: 57.3 mg/dL (ref >39.00)
LDL Cholesterol: 95 mg/dL (ref 0–99)
NONHDL: 116.7
TRIGLYCERIDES: 107 mg/dL (ref 0.0–149.0)
Total CHOL/HDL Ratio: 3
VLDL: 21.4 mg/dL (ref 0.0–40.0)

## 2014-10-16 LAB — BASIC METABOLIC PANEL
BUN: 21 mg/dL (ref 6–23)
CALCIUM: 9.3 mg/dL (ref 8.4–10.5)
CO2: 27 meq/L (ref 19–32)
CREATININE: 0.59 mg/dL (ref 0.40–1.20)
Chloride: 105 mEq/L (ref 96–112)
GFR: 108.44 mL/min (ref >60.00)
Glucose, Bld: 92 mg/dL (ref 70–99)
Potassium: 3.8 mEq/L (ref 3.5–5.1)
Sodium: 139 mEq/L (ref 135–145)

## 2014-10-17 ENCOUNTER — Ambulatory Visit
Admission: RE | Admit: 2014-10-17 | Discharge: 2014-10-17 | Disposition: A | Discharge status: Home / Self Care | Payer: Medicare Other | Source: Ambulatory Visit

## 2014-10-17 DIAGNOSIS — Z1231 Encounter for screening mammogram for malignant neoplasm of breast: Secondary | ICD-10-CM | POA: Diagnosis not present

## 2014-10-23 ENCOUNTER — Ambulatory Visit (INDEPENDENT_AMBULATORY_CARE_PROVIDER_SITE_OTHER): Payer: Medicare Other | Admitting: Internal Medicine

## 2014-10-23 ENCOUNTER — Encounter: Payer: Self-pay | Admitting: Internal Medicine

## 2014-10-23 VITALS — BP 112/78 | Temp 97.6°F | Ht 64.0 in | Wt 178.2 lb

## 2014-10-23 DIAGNOSIS — Z23 Encounter for immunization: Secondary | ICD-10-CM

## 2014-10-23 DIAGNOSIS — Z0000 Encounter for general adult medical examination without abnormal findings: Secondary | ICD-10-CM

## 2014-10-23 DIAGNOSIS — J45909 Unspecified asthma, uncomplicated: Secondary | ICD-10-CM

## 2014-10-23 DIAGNOSIS — K219 Gastro-esophageal reflux disease without esophagitis: Secondary | ICD-10-CM | POA: Diagnosis not present

## 2014-10-23 DIAGNOSIS — I10 Essential (primary) hypertension: Secondary | ICD-10-CM | POA: Diagnosis not present

## 2014-10-23 DIAGNOSIS — E785 Hyperlipidemia, unspecified: Secondary | ICD-10-CM | POA: Diagnosis not present

## 2014-10-23 DIAGNOSIS — Z79899 Other long term (current) drug therapy: Secondary | ICD-10-CM

## 2014-10-23 MED ORDER — ALBUTEROL SULFATE HFA 108 (90 BASE) MCG/ACT IN AERS: 2.0000 | INHALATION_SPRAY | Freq: Four times a day (QID) | RESPIRATORY_TRACT | Status: DC | PRN

## 2014-10-23 NOTE — Progress Notes (Signed)
Pre visit review using our clinic review tool, if applicable. No additional management support is needed unless otherwise documented below in the visit note.  Chief Complaint  Patient presents with  . Medicare Wellness Visit    HPI: Penny Barnett 64 y.o. comes in today for Preventive Medicare wellness visit . And Chronic disease management  No major injuries, ed visits ,hospitalizations , new medications since last visit.  GERD OTC   Every day .   For afternooon sx if doesn't  take  remote hx of antacids causing renal stones    Asthma : stable still taking theophyline . Doing  Flares   Needs rescue inhaler if case needed. excpired  Has done well and theophylline and reluctant to try off at this time doing well.  BP:  good at home . Only taking clonidine at night 3-4 x per week 0.1 taking hctx 25 every other day to avoid leg cramps . If doesn take gets swelling  On accupril still no se    LIPIDS no se of meds  Health Maintenance  Topic Date Due  . ZOSTAVAX  12/06/2008  . HIV Screening  10/14/2015 (Originally 12/07/1965)  . INFLUENZA VACCINE  01/14/2015  . PNA vac Low Risk Adult (2 of 2 - PPSV23) 10/23/2015  . MAMMOGRAM  10/16/2016  . TETANUS/TDAP  06/15/2018  . COLONOSCOPY  03/12/2021  . DEXA SCAN  Completed   Health Maintenance Review LIFESTYLE:  Exercise:  Walking horse acitivey  Tobacco/ETS:no Alcohol: no to rare  Sugar beverages:no Sleep:6-8 ghours nocturia x 1-3 no osa by report  Drug use: no Bone density: 2012 nl -1.0 Colonoscopy: utd 9 12  MEDICARE DOCUMENT QUESTIONS  TO SCAN  Hearing: ok  Vision:  No limitations at present . Last eye check UTD  Safety:  Has smoke detector and wears seat belts.  No firearms. No excess sun exposure. Sees dentist regularly.  Falls: no  Advance directive :  Reviewed doesn't have  one.  Memory: Felt to be good  , no concern from her or her family.  Depression: No anhedonia unusual crying or depressive  symptoms  Nutrition: Eats well balanced diet; adequate calcium and vitamin D. No swallowing chewing problems.  Injury: no major injuries in the last six months.  Other healthcare providers:  Reviewed today .  Social:  Dog s horses  hh of 1    Preventive parameters: up-to-date  Reviewed   ADLS:   There are no problems or need for assistance  driving, feeding, obtaining food, dressing, toileting and bathing, managing money using phone. She is independent.   ROS:  GEN/ HEENT: No fever, significant weight changes sweats headaches vision problems hearing changes, CV/ PULM; No chest pain shortness of breath cough, syncope,edema  change in exercise tolerance. GI /GU: No adominal pain, vomiting, change in bowel habits. No blood in the stool. No significant GU symptoms. SKIN/HEME: ,no acute skin rashes suspicious lesions or bleeding. No lymphadenopathy, nodules, masses.  NEURO/ PSYCH:  No neurologic signs such as weakness numbness. No depression anxiety. IMM/ Allergy: No unusual infections.  Allergy .   Suppressed on meds  REST of 12 system review negative except as per HPI   Past Medical History  Diagnosis Date  . Asthma     as a child and then attack 1996  poss allergic to cat hair. ventilated x 2 . saw Dr [place] Callas then .   Marland Kitchen GERD (gastroesophageal reflux disease)     2000 taking otc prevacid  evey day.   . Hypertension      3 meds controlled  . Hyperlipidemia   . Kidney stone     10 - 15 years     Family History  Problem Relation Age of Onset  . Breast cancer Mother   . Cancer Mother     bladder  . Uterine cancer Sister   . Colon cancer Neg Hx   . Alcohol abuse Father     History   Social History  . Marital Status: Single    Spouse Name: N/A  . Number of Children: N/A  . Years of Education: N/A   Social History Main Topics  . Smoking status: Former Smoker    Quit date: 06/21/1986  . Smokeless tobacco: Never Used  . Alcohol Use: Yes     Comment: socially- once a  month  . Drug Use: No  . Sexual Activity: Not on file   Other Topics Concern  . None   Social History Narrative   [place]  Has family hsere   Works 45 per week [employer]    And auto auction 2 nights per week.  Horticulturist, commercialDealer register  .    HS education  single   6 hours of sleep   Hh  Of 1  Pet dog an horse.    Remote tobacco  1-2 ppd stopped 1988    Etoh couple times a month rare    ocass  Diet soda.                 Outpatient Encounter Prescriptions as of 10/23/2014  Medication Sig  . cholecalciferol (VITAMIN D) 1000 UNITS tablet Take 1,000 Units by mouth daily.    . cyclobenzaprine (FLEXERIL) 5 MG tablet Take 1 tablet (5 mg total) by mouth 3 (three) times daily as needed for muscle spasms.  . fluticasone (FLOVENT HFA) 110 MCG/ACT inhaler Inhale 1 puff into the lungs 2 (two) times daily.  . hydrochlorothiazide (HYDRODIURIL) 25 MG tablet Take 1 tablet (25 mg total) by mouth daily.  Marland Kitchen. ipratropium (ATROVENT HFA) 17 MCG/ACT inhaler Inhale 2 puffs into the lungs every 6 (six) hours.  . lansoprazole (PREVACID) 30 MG capsule Take 1 capsule (30 mg total) by mouth daily.  . montelukast (SINGULAIR) 10 MG tablet Take 1 tablet (10 mg total) by mouth every morning.  . Multiple Vitamins-Minerals (MULTIVITAMIN WITH MINERALS) tablet Take 1 tablet by mouth daily.    . quinapril (ACCUPRIL) 20 MG tablet Take 1 tablet (20 mg total) by mouth daily.  . rosuvastatin (CRESTOR) 10 MG tablet Take 1 tablet (10 mg total) by mouth daily. 1/2 daily  . theophylline (THEODUR) 300 MG 12 hr tablet Take 1 tablet (300 mg total) by mouth daily.  . [DISCONTINUED] albuterol (PROVENTIL HFA) 108 (90 BASE) MCG/ACT inhaler Inhale 2 puffs into the lungs every 6 (six) hours as needed.  . [DISCONTINUED] cloNIDine (CATAPRES) 0.1 MG tablet Take 1 tablet (0.1 mg total) by mouth daily.  Marland Kitchen. albuterol (PROVENTIL HFA;VENTOLIN HFA) 108 (90 BASE) MCG/ACT inhaler Inhale 2 puffs into the lungs every 6 (six)  hours as needed for wheezing or shortness of breath.   No facility-administered encounter medications on file as of 10/23/2014.    EXAM:  BP 112/78 mmHg  Temp(Src) 97.6 F (36.4 C) (Oral)  Ht 5\' 4"  (1.626 m)  Wt 178 lb 3.2 oz (80.831 kg)  BMI 30.57 kg/m2  Body mass index is  30.57 kg/(m^2).  Physical Exam: Vital signs reviewed ZOX:WRUEGEN:This is a well-developed well-nourished alert cooperative   who appears stated age in no acute distress.  HEENT: normocephalic atraumatic , Eyes: PERRL EOM's full, conjunctiva clear, Nares: paten,t no deformity discharge or tenderness., Ears: no deformity EAC's clear TMs with normal landmarks. Mouth: clear OP, no lesions, edema.  Moist mucous membranes. Dentition in adequate repair. NECK: supple without masses, thyromegaly or bruits. CHEST/PULM:  Clear to auscultation and percussion breath sounds equal no wheeze , rales or rhonchi. No chest wall deformities or tenderness.Breast: normal by inspection . No dimpling, discharge, masses, tenderness or discharge . CV: PMI is nondisplaced, S1 S2 no gallops, murmurs, rubs. Peripheral pulses are full without delay.No JVD .  ABDOMEN: Bowel sounds normal nontender  No guard or rebound, no hepato splenomegal no CVA tenderness.  No hernia. Extremtities:  No clubbing cyanosis or edema, no acute joint swelling or redness no focal atrophy NEURO:  Oriented x3, cranial nerves 3-12 appear to be intact, no obvious focal weakness,gait within normal limits no abnormal reflexes or asymmetrical SKIN: No acute rashes normal turgor, color, no bruising or petechiae. Sun changes  PSYCH: Oriented, good eye contact, no obvious depression anxiety, cognition and judgment appear normal. LN: no cervical axillary inguinal adenopathy No noted deficits in memory, attention, and speech.   Lab Results  Component Value Date   WBC 5.1 10/16/2014   HGB 12.9 10/16/2014   HCT 38.2 10/16/2014   PLT 206.0 10/16/2014   GLUCOSE 92 10/16/2014   CHOL  174 10/16/2014   TRIG 107.0 10/16/2014   HDL 57.30 10/16/2014   LDLDIRECT 159.2 10/07/2012   LDLCALC 95 10/16/2014   ALT 24 10/16/2014   AST 19 10/16/2014   NA 139 10/16/2014   K 3.8 10/16/2014   CL 105 10/16/2014   CREATININE 0.59 10/16/2014   BUN 21 10/16/2014   CO2 27 10/16/2014   TSH 1.15 10/16/2014    ASSESSMENT AND PLAN:  Discussed the following assessment and plan:  Welcome to Medicare preventive visit - Ho on advanced directvies  utd    Essential hypertension - good control only taking clinhs a few time per week trial off   Hyperlipidemia - good control  Asthma, unspecified asthma severity, uncomplicated - want sto stay onf theopas doing bery well needs refill for rescue as rarely uses and rx expired   Gastroesophageal reflux disease, esophagitis presence not specified - takes otc qd gets sx if not takingdisc risj benefit   Medication management - ocassuse of flexeril for back  if   botehrs after balin hay etc  not reg use takes at night if needed  Need for vaccination with 13-polyvalent pneumococcal conjugate vaccine - Plan: Pneumococcal conjugate vaccine 13-valent revewied all medical disease conditions  And riskl benefit of each medication Patient Care Team: Madelin HeadingsWanda K Panosh, MD as PCP - General (Internal Medicine) Louis Meckelobert D Kaplan, MD (Gastroenterology) Nita SellsJohn Hall, MD (Dermatology)  Patient Instructions  Continue lifestyle intervention healthy eating and exercise . Can  stop the clonidine since you are taking such a low dose and not every  Day . Monitor bp readings to ensure  Below 140/90   Consider trying 1/2 dose or 12.5 mg hctz each day in stead of 25   Every other day, dexa scan next year. PPSV23 next year  You received prevnar 13 today ( no charge)   Healthy lifestyle includes : At least 150 minutes of exercise weeks  , weight at healthy levels, which is usually  BMI 19-25. Avoid trans fats and processed foods;  Increase fresh fruits and veges to 5  servings per day. And avoid sweet beverages including tea and juice. Mediterranean diet with olive oil and nuts have been noted to be heart and brain healthy . Avoid tobacco products . Limit  alcohol to  7 per week for women and 14 servings for men.  Get adequate sleep . Wear seat belts . Don't text and drive .  I doing well   Wellness visit and med evaluation in 1 year  Or as needed.   Food Choices for Gastroesophageal Reflux Disease When you have gastroesophageal reflux disease (GERD), the foods you eat and your eating habits are very important. Choosing the right foods can help ease the discomfort of GERD. WHAT GENERAL GUIDELINES DO I NEED TO FOLLOW?  Choose fruits, vegetables, whole grains, low-fat dairy products, and low-fat meat, fish, and poultry.  Limit fats such as oils, salad dressings, butter, nuts, and avocado.  Keep a food diary to identify foods that cause symptoms.  Avoid foods that cause reflux. These may be different for different people.  Eat frequent small meals instead of three large meals each day.  Eat your meals slowly, in a relaxed setting.  Limit fried foods.  Cook foods using methods other than frying.  Avoid drinking alcohol.  Avoid drinking large amounts of liquids with your meals.  Avoid bending over or lying down until 2-3 hours after eating. WHAT FOODS ARE NOT RECOMMENDED? The following are some foods and drinks that may worsen your symptoms: Vegetables Tomatoes. Tomato juice. Tomato and spaghetti sauce. Chili peppers. Onion and garlic. Horseradish. Fruits Oranges, grapefruit, and lemon (fruit and juice). Meats High-fat meats, fish, and poultry. This includes hot dogs, ribs, ham, sausage, salami, and bacon. Dairy Whole milk and chocolate milk. Sour cream. Cream. Butter. Ice cream. Cream cheese.  Beverages Coffee and tea, with or without caffeine. Carbonated beverages or energy drinks. Condiments Hot sauce. Barbecue sauce.   Sweets/Desserts Chocolate and cocoa. Donuts. Peppermint and spearmint. Fats and Oils High-fat foods, including [place] fries and potato chips. Other Vinegar. Strong spices, such as black pepper, white pepper, red pepper, cayenne, curry powder, cloves, ginger, and chili powder. The items listed above may not be a complete list of foods and beverages to avoid. Contact your dietitian for more information. Document Released: 06/01/2005 Document Revised: 06/06/2013 Document Reviewed: 04/05/2013 [facility] Patient Information 2015 [place], [place]. This information is not intended to replace advice given to you by your health care provider. Make sure you discuss any questions you have with your health care provider.   Neta Mends. Panosh M.D.  Patient received prenvar 13 but recorded to have one last year ( she didn't remember that she received this)   Disc no increase medical harm   Should occur no charge if this is the case

## 2014-10-23 NOTE — Patient Instructions (Addendum)
Continue lifestyle intervention healthy eating and exercise . Can  stop the clonidine since you are taking such a low dose and not every  Day . Monitor bp readings to ensure  Below 140/90   Consider trying 1/2 dose or 12.5 mg hctz each day in stead of 25   Every other day, dexa scan next year. PPSV23 next year  You received prevnar 13 today ( no charge)   Healthy lifestyle includes : At least 150 minutes of exercise weeks  , weight at healthy levels, which is usually   BMI 19-25. Avoid trans fats and processed foods;  Increase fresh fruits and veges to 5 servings per day. And avoid sweet beverages including tea and juice. Mediterranean diet with olive oil and nuts have been noted to be heart and brain healthy . Avoid tobacco products . Limit  alcohol to  7 per week for women and 14 servings for men.  Get adequate sleep . Wear seat belts . Don't text and drive .  I doing well   Wellness visit and med evaluation in 1 year  Or as needed.   Food Choices for Gastroesophageal Reflux Disease When you have gastroesophageal reflux disease (GERD), the foods you eat and your eating habits are very important. Choosing the right foods can help ease the discomfort of GERD. WHAT GENERAL GUIDELINES DO I NEED TO FOLLOW?  Choose fruits, vegetables, whole grains, low-fat dairy products, and low-fat meat, fish, and poultry.  Limit fats such as oils, salad dressings, butter, nuts, and avocado.  Keep a food diary to identify foods that cause symptoms.  Avoid foods that cause reflux. These may be different for different people.  Eat frequent small meals instead of three large meals each day.  Eat your meals slowly, in a relaxed setting.  Limit fried foods.  Cook foods using methods other than frying.  Avoid drinking alcohol.  Avoid drinking large amounts of liquids with your meals.  Avoid bending over or lying down until 2-3 hours after eating. WHAT FOODS ARE NOT RECOMMENDED? The following are  some foods and drinks that may worsen your symptoms: Vegetables Tomatoes. Tomato juice. Tomato and spaghetti sauce. Chili peppers. Onion and garlic. Horseradish. Fruits Oranges, grapefruit, and lemon (fruit and juice). Meats High-fat meats, fish, and poultry. This includes hot dogs, ribs, ham, sausage, salami, and bacon. Dairy Whole milk and chocolate milk. Sour cream. Cream. Butter. Ice cream. Cream cheese.  Beverages Coffee and tea, with or without caffeine. Carbonated beverages or energy drinks. Condiments Hot sauce. Barbecue sauce.  Sweets/Desserts Chocolate and cocoa. Donuts. Peppermint and spearmint. Fats and Oils High-fat foods, including [place] fries and potato chips. Other Vinegar. Strong spices, such as black pepper, white pepper, red pepper, cayenne, curry powder, cloves, ginger, and chili powder. The items listed above may not be a complete list of foods and beverages to avoid. Contact your dietitian for more information. Document Released: 06/01/2005 Document Revised: 06/06/2013 Document Reviewed: 04/05/2013 [facility] Patient Information 2015 [place]. This information is not intended to replace advice given to you by your health care provider. Make sure you discuss any questions you have with your health care provider.

## 2014-10-29 ENCOUNTER — Encounter: Payer: Self-pay | Admitting: Internal Medicine

## 2014-11-07 NOTE — Telephone Encounter (Signed)
PA has been submitted. #[id number]

## 2014-11-07 NOTE — Telephone Encounter (Signed)
PA was denied.  Patient must try and fail Ventolin HFA aerosol inhaler.

## 2014-11-11 NOTE — Telephone Encounter (Signed)
Please give patient the below  Indicated  approved inhaler as Stated : "Patient must try and fail Ventolin HFA aerosol inhaler.   WP

## 2014-11-13 MED ORDER — ALBUTEROL SULFATE HFA 108 (90 BASE) MCG/ACT IN AERS: 2.0000 | INHALATION_SPRAY | Freq: Four times a day (QID) | RESPIRATORY_TRACT | Status: DC | PRN

## 2014-11-14 ENCOUNTER — Telehealth: Payer: Self-pay

## 2014-11-14 ENCOUNTER — Other Ambulatory Visit: Payer: Self-pay

## 2014-11-14 MED ORDER — THEOPHYLLINE ER 300 MG PO TB12: 300.0000 mg | ORAL_TABLET | Freq: Every day | ORAL | Status: DC

## 2014-11-14 MED ORDER — ALBUTEROL SULFATE HFA 108 (90 BASE) MCG/ACT IN AERS: 2.0000 | INHALATION_SPRAY | Freq: Four times a day (QID) | RESPIRATORY_TRACT | Status: DC | PRN

## 2014-11-14 MED ORDER — QUINAPRIL HCL 20 MG PO TABS: 20.0000 mg | ORAL_TABLET | Freq: Every day | ORAL | Status: DC

## 2014-11-14 MED ORDER — HYDROCHLOROTHIAZIDE 25 MG PO TABS: 25.0000 mg | ORAL_TABLET | Freq: Every day | ORAL | Status: DC

## 2014-11-14 MED ORDER — MONTELUKAST SODIUM 10 MG PO TABS: 10.0000 mg | ORAL_TABLET | ORAL | Status: DC

## 2014-11-14 MED ORDER — CLONIDINE HCL 0.1 MG PO TABS: 0.1000 mg | ORAL_TABLET | Freq: Every day | ORAL | Status: DC

## 2014-11-14 NOTE — Telephone Encounter (Signed)
Sent to the pharmacy by e-scribe. 

## 2014-11-14 NOTE — Telephone Encounter (Signed)
Error

## 2014-11-14 NOTE — Telephone Encounter (Signed)
[facility]: albuterol (VENTOLIN HFA) 108 (90 BASE) MCG/ACT inhaler, theophylline (THEODUR) 300 MG 12 hr tablet, quinapril (ACCUPRIL) 20 MG tablet

## 2014-11-14 NOTE — Telephone Encounter (Signed)
[facility] [place] - [phone]: cloNIDine (CATAPRES) 0.1 MG tablet, montelukast (SINGULAIR) 10 MG tablet, hydrochlorothiazide (HYDRODIURIL) 25 MG tablet

## 2014-12-30 ENCOUNTER — Inpatient Hospital Stay (HOSPITAL_COMMUNITY)
Admission: EM | Admit: 2014-12-30 | Discharge: 2015-01-04 | DRG: 309 | Disposition: A | Discharge status: Home / Self Care | Payer: Medicare Other | Attending: Internal Medicine | Admitting: Internal Medicine

## 2014-12-30 ENCOUNTER — Emergency Department (HOSPITAL_COMMUNITY): Payer: Medicare Other

## 2014-12-30 ENCOUNTER — Encounter (HOSPITAL_COMMUNITY): Payer: Self-pay | Admitting: Emergency Medicine

## 2014-12-30 ENCOUNTER — Emergency Department (INDEPENDENT_AMBULATORY_CARE_PROVIDER_SITE_OTHER)
Admission: EM | Admit: 2014-12-30 | Discharge: 2014-12-30 | Disposition: A | Discharge status: Nursing Home (Includes ICF) | Payer: Medicare Other | Source: Home / Self Care | Attending: Emergency Medicine | Admitting: Emergency Medicine

## 2014-12-30 ENCOUNTER — Encounter (HOSPITAL_COMMUNITY): Payer: Self-pay | Admitting: *Deleted

## 2014-12-30 ENCOUNTER — Inpatient Hospital Stay (HOSPITAL_COMMUNITY): Payer: Medicare Other

## 2014-12-30 DIAGNOSIS — R404 Transient alteration of awareness: Secondary | ICD-10-CM | POA: Diagnosis not present

## 2014-12-30 DIAGNOSIS — R000 Tachycardia, unspecified: Secondary | ICD-10-CM

## 2014-12-30 DIAGNOSIS — K219 Gastro-esophageal reflux disease without esophagitis: Secondary | ICD-10-CM | POA: Diagnosis present

## 2014-12-30 DIAGNOSIS — N390 Urinary tract infection, site not specified: Secondary | ICD-10-CM | POA: Diagnosis present

## 2014-12-30 DIAGNOSIS — J45909 Unspecified asthma, uncomplicated: Secondary | ICD-10-CM | POA: Diagnosis not present

## 2014-12-30 DIAGNOSIS — D61818 Other pancytopenia: Secondary | ICD-10-CM | POA: Diagnosis not present

## 2014-12-30 DIAGNOSIS — R5383 Other fatigue: Secondary | ICD-10-CM

## 2014-12-30 DIAGNOSIS — R531 Weakness: Secondary | ICD-10-CM | POA: Diagnosis not present

## 2014-12-30 DIAGNOSIS — I4892 Unspecified atrial flutter: Secondary | ICD-10-CM | POA: Diagnosis not present

## 2014-12-30 DIAGNOSIS — I351 Nonrheumatic aortic (valve) insufficiency: Secondary | ICD-10-CM | POA: Diagnosis not present

## 2014-12-30 DIAGNOSIS — Z880 Allergy status to penicillin: Secondary | ICD-10-CM | POA: Diagnosis not present

## 2014-12-30 DIAGNOSIS — I471 Supraventricular tachycardia: Secondary | ICD-10-CM | POA: Diagnosis not present

## 2014-12-30 DIAGNOSIS — E785 Hyperlipidemia, unspecified: Secondary | ICD-10-CM | POA: Diagnosis present

## 2014-12-30 DIAGNOSIS — Z6830 Body mass index (BMI) 30.0-30.9, adult: Secondary | ICD-10-CM | POA: Diagnosis not present

## 2014-12-30 DIAGNOSIS — J4540 Moderate persistent asthma, uncomplicated: Secondary | ICD-10-CM | POA: Diagnosis not present

## 2014-12-30 DIAGNOSIS — I10 Essential (primary) hypertension: Secondary | ICD-10-CM | POA: Diagnosis present

## 2014-12-30 DIAGNOSIS — I481 Persistent atrial fibrillation: Secondary | ICD-10-CM | POA: Diagnosis present

## 2014-12-30 DIAGNOSIS — Z87891 Personal history of nicotine dependence: Secondary | ICD-10-CM

## 2014-12-30 DIAGNOSIS — E860 Dehydration: Secondary | ICD-10-CM | POA: Diagnosis present

## 2014-12-30 DIAGNOSIS — J449 Chronic obstructive pulmonary disease, unspecified: Secondary | ICD-10-CM | POA: Diagnosis present

## 2014-12-30 DIAGNOSIS — Z79899 Other long term (current) drug therapy: Secondary | ICD-10-CM | POA: Diagnosis not present

## 2014-12-30 DIAGNOSIS — R197 Diarrhea, unspecified: Secondary | ICD-10-CM | POA: Diagnosis present

## 2014-12-30 DIAGNOSIS — E876 Hypokalemia: Secondary | ICD-10-CM | POA: Diagnosis not present

## 2014-12-30 DIAGNOSIS — I429 Cardiomyopathy, unspecified: Secondary | ICD-10-CM | POA: Diagnosis present

## 2014-12-30 DIAGNOSIS — I82409 Acute embolism and thrombosis of unspecified deep veins of unspecified lower extremity: Secondary | ICD-10-CM

## 2014-12-30 DIAGNOSIS — E6609 Other obesity due to excess calories: Secondary | ICD-10-CM | POA: Diagnosis present

## 2014-12-30 DIAGNOSIS — I4819 Other persistent atrial fibrillation: Secondary | ICD-10-CM | POA: Diagnosis present

## 2014-12-30 DIAGNOSIS — R791 Abnormal coagulation profile: Secondary | ICD-10-CM | POA: Diagnosis not present

## 2014-12-30 DIAGNOSIS — R7989 Other specified abnormal findings of blood chemistry: Secondary | ICD-10-CM

## 2014-12-30 LAB — BASIC METABOLIC PANEL
Anion gap: 7 (ref 5–15)
BUN: 13 mg/dL (ref 6–20)
CO2: 24 mmol/L (ref 22–32)
Calcium: 7.4 mg/dL — ABNORMAL LOW (ref 8.9–10.3)
Chloride: 104 mmol/L (ref 101–111)
Creatinine, Ser: 0.57 mg/dL (ref 0.44–1.00)
GFR calc Af Amer: >60 mL/min (ref >60)
GFR calc non Af Amer: >60 mL/min (ref >60)
GLUCOSE: 115 mg/dL — AB (ref 65–99)
POTASSIUM: 3 mmol/L — AB (ref 3.5–5.1)
SODIUM: 135 mmol/L (ref 135–145)

## 2014-12-30 LAB — URINALYSIS, ROUTINE W REFLEX MICROSCOPIC
Bilirubin Urine: NEGATIVE
GLUCOSE, UA: NEGATIVE mg/dL
KETONES UR: NEGATIVE mg/dL
Nitrite: POSITIVE — AB
Protein, ur: 100 mg/dL — AB
Specific Gravity, Urine: 1.02 (ref 1.005–1.030)
Urobilinogen, UA: 0.2 mg/dL (ref 0.0–1.0)
pH: 6 (ref 5.0–8.0)

## 2014-12-30 LAB — COMPREHENSIVE METABOLIC PANEL
ALT: 21 U/L (ref 14–54)
ANION GAP: 8 (ref 5–15)
AST: 21 U/L (ref 15–41)
Albumin: 2.9 g/dL — ABNORMAL LOW (ref 3.5–5.0)
Alkaline Phosphatase: 65 U/L (ref 38–126)
BUN: 15 mg/dL (ref 6–20)
CO2: 27 mmol/L (ref 22–32)
CREATININE: 0.72 mg/dL (ref 0.44–1.00)
Calcium: 8.1 mg/dL — ABNORMAL LOW (ref 8.9–10.3)
Chloride: 99 mmol/L — ABNORMAL LOW (ref 101–111)
GFR calc Af Amer: >60 mL/min (ref >60)
GFR calc non Af Amer: >60 mL/min (ref >60)
Glucose, Bld: 113 mg/dL — ABNORMAL HIGH (ref 65–99)
Potassium: 2.6 mmol/L — CL (ref 3.5–5.1)
Sodium: 134 mmol/L — ABNORMAL LOW (ref 135–145)
TOTAL PROTEIN: 6.1 g/dL — AB (ref 6.5–8.1)
Total Bilirubin: 0.6 mg/dL (ref 0.3–1.2)

## 2014-12-30 LAB — TROPONIN I
TROPONIN I: 0.05 ng/mL — AB (ref <0.031)
Troponin I: 0.03 ng/mL (ref <0.031)

## 2014-12-30 LAB — POCT I-STAT, CHEM 8
BUN: 19 mg/dL (ref 6–20)
CHLORIDE: 96 mmol/L — AB (ref 101–111)
CREATININE: 0.7 mg/dL (ref 0.44–1.00)
Calcium, Ion: 1.03 mmol/L — ABNORMAL LOW (ref 1.13–1.30)
GLUCOSE: 120 mg/dL — AB (ref 65–99)
HCT: 46 % (ref 36.0–46.0)
Hemoglobin: 15.6 g/dL — ABNORMAL HIGH (ref 12.0–15.0)
Potassium: 2.6 mmol/L — CL (ref 3.5–5.1)
Sodium: 135 mmol/L (ref 135–145)
TCO2: 24 mmol/L (ref 0–100)

## 2014-12-30 LAB — TSH: TSH: 0.922 u[IU]/mL (ref 0.350–4.500)

## 2014-12-30 LAB — CBC WITH DIFFERENTIAL/PLATELET
Basophils Absolute: 0 10*3/uL (ref 0.0–0.1)
Basophils Relative: 0 % (ref 0–1)
Eosinophils Absolute: 0 10*3/uL (ref 0.0–0.7)
Eosinophils Relative: 0 % (ref 0–5)
HEMATOCRIT: 38.2 % (ref 36.0–46.0)
HEMOGLOBIN: 12.6 g/dL (ref 12.0–15.0)
LYMPHS ABS: 0.5 10*3/uL — AB (ref 0.7–4.0)
Lymphocytes Relative: 16 % (ref 12–46)
MCH: 26.5 pg (ref 26.0–34.0)
MCHC: 33 g/dL (ref 30.0–36.0)
MCV: 80.4 fL (ref 78.0–100.0)
Monocytes Absolute: 0.2 10*3/uL (ref 0.1–1.0)
Monocytes Relative: 7 % (ref 3–12)
NEUTROS ABS: 2.5 10*3/uL (ref 1.7–7.7)
NEUTROS PCT: 77 % (ref 43–77)
Platelets: 140 10*3/uL — ABNORMAL LOW (ref 150–400)
RBC: 4.75 MIL/uL (ref 3.87–5.11)
RDW: 14.7 % (ref 11.5–15.5)
WBC: 3.3 10*3/uL — ABNORMAL LOW (ref 4.0–10.5)

## 2014-12-30 LAB — URINE MICROSCOPIC-ADD ON

## 2014-12-30 LAB — POC URINE PREG, ED: PREG TEST UR: NEGATIVE

## 2014-12-30 LAB — T4, FREE: FREE T4: 1.02 ng/dL (ref 0.61–1.12)

## 2014-12-30 LAB — MAGNESIUM: Magnesium: 1.9 mg/dL (ref 1.7–2.4)

## 2014-12-30 LAB — D-DIMER, QUANTITATIVE: D-Dimer, Quant: 3.67 ug/mL-FEU — ABNORMAL HIGH (ref 0.00–0.48)

## 2014-12-30 MED ORDER — MAGNESIUM SULFATE IN D5W 10-5 MG/ML-% IV SOLN
1.0000 g | Freq: Once | INTRAVENOUS | Status: AC
  Administered 2014-12-30: 1 g via INTRAVENOUS
  Filled 2014-12-30: qty 100

## 2014-12-30 MED ORDER — SODIUM CHLORIDE 0.9 % IJ SOLN
3.0000 mL | Freq: Two times a day (BID) | INTRAMUSCULAR | Status: DC
  Administered 2014-12-31: 3 mL via INTRAVENOUS
  Administered 2014-12-31: 10 mL via INTRAVENOUS
  Administered 2015-01-01 – 2015-01-04 (×5): 3 mL via INTRAVENOUS

## 2014-12-30 MED ORDER — IPRATROPIUM BROMIDE 0.02 % IN SOLN
0.5000 mg | Freq: Four times a day (QID) | RESPIRATORY_TRACT | Status: DC
  Administered 2014-12-30: 0.5 mg via RESPIRATORY_TRACT
  Filled 2014-12-30: qty 2.5

## 2014-12-30 MED ORDER — SODIUM CHLORIDE 0.9 % IV SOLN
Freq: Once | INTRAVENOUS | Status: AC
  Administered 2014-12-30: 15:00:00 via INTRAVENOUS

## 2014-12-30 MED ORDER — PANTOPRAZOLE SODIUM 40 MG PO TBEC
40.0000 mg | DELAYED_RELEASE_TABLET | Freq: Every day | ORAL | Status: DC
  Administered 2014-12-30: 40 mg via ORAL
  Filled 2014-12-30: qty 1

## 2014-12-30 MED ORDER — ACETAMINOPHEN 325 MG PO TABS
650.0000 mg | ORAL_TABLET | Freq: Four times a day (QID) | ORAL | Status: DC | PRN
  Administered 2014-12-31 – 2015-01-02 (×3): 650 mg via ORAL
  Filled 2014-12-30 (×4): qty 2

## 2014-12-30 MED ORDER — SODIUM CHLORIDE 0.9 % IV BOLUS (SEPSIS)
1000.0000 mL | Freq: Once | INTRAVENOUS | Status: AC
  Administered 2014-12-30: 1000 mL via INTRAVENOUS

## 2014-12-30 MED ORDER — FLUTICASONE PROPIONATE HFA 110 MCG/ACT IN AERO
1.0000 | INHALATION_SPRAY | Freq: Two times a day (BID) | RESPIRATORY_TRACT | Status: DC
Start: 2014-12-30 — End: 2014-12-30

## 2014-12-30 MED ORDER — SODIUM CHLORIDE 0.9 % IV SOLN
Freq: Once | INTRAVENOUS | Status: AC
  Administered 2014-12-30: 19:00:00 via INTRAVENOUS

## 2014-12-30 MED ORDER — POTASSIUM CHLORIDE 10 MEQ/100ML IV SOLN
10.0000 meq | INTRAVENOUS | Status: AC
  Administered 2014-12-30 (×2): 10 meq via INTRAVENOUS
  Filled 2014-12-30 (×2): qty 100

## 2014-12-30 MED ORDER — ACETAMINOPHEN 650 MG RE SUPP: 650.0000 mg | Freq: Four times a day (QID) | RECTAL | Status: DC | PRN

## 2014-12-30 MED ORDER — ENOXAPARIN SODIUM 40 MG/0.4ML ~~LOC~~ SOLN
40.0000 mg | SUBCUTANEOUS | Status: DC
  Administered 2014-12-30 – 2014-12-31 (×2): 40 mg via SUBCUTANEOUS
  Filled 2014-12-30 (×2): qty 0.4

## 2014-12-30 MED ORDER — SODIUM CHLORIDE 0.9 % IV SOLN
INTRAVENOUS | Status: DC
  Administered 2014-12-30: 1000 mL via INTRAVENOUS
  Administered 2014-12-31 – 2015-01-01 (×2): via INTRAVENOUS

## 2014-12-30 MED ORDER — BUDESONIDE 0.25 MG/2ML IN SUSP
0.2500 mg | Freq: Two times a day (BID) | RESPIRATORY_TRACT | Status: DC
  Administered 2014-12-31 – 2015-01-04 (×9): 0.25 mg via RESPIRATORY_TRACT
  Filled 2014-12-30 (×9): qty 2

## 2014-12-30 MED ORDER — ONDANSETRON HCL 4 MG PO TABS
4.0000 mg | ORAL_TABLET | Freq: Four times a day (QID) | ORAL | Status: DC | PRN
Start: 2014-12-30 — End: 2015-01-04

## 2014-12-30 MED ORDER — POTASSIUM CHLORIDE 10 MEQ/100ML IV SOLN
10.0000 meq | INTRAVENOUS | Status: AC
  Administered 2014-12-31 (×2): 10 meq via INTRAVENOUS
  Filled 2014-12-30 (×2): qty 100

## 2014-12-30 MED ORDER — IOHEXOL 350 MG/ML SOLN
100.0000 mL | Freq: Once | INTRAVENOUS | Status: AC | PRN
  Administered 2014-12-30: 100 mL via INTRAVENOUS

## 2014-12-30 MED ORDER — IPRATROPIUM BROMIDE 0.02 % IN SOLN
0.5000 mg | Freq: Two times a day (BID) | RESPIRATORY_TRACT | Status: DC
  Administered 2014-12-31 – 2015-01-04 (×9): 0.5 mg via RESPIRATORY_TRACT
  Filled 2014-12-30 (×9): qty 2.5

## 2014-12-30 MED ORDER — POTASSIUM CHLORIDE CRYS ER 20 MEQ PO TBCR
40.0000 meq | EXTENDED_RELEASE_TABLET | Freq: Once | ORAL | Status: AC
  Administered 2014-12-30: 40 meq via ORAL
  Filled 2014-12-30: qty 2

## 2014-12-30 MED ORDER — CIPROFLOXACIN IN D5W 400 MG/200ML IV SOLN
400.0000 mg | Freq: Two times a day (BID) | INTRAVENOUS | Status: DC
  Administered 2014-12-31 (×3): 400 mg via INTRAVENOUS
  Filled 2014-12-30 (×6): qty 200

## 2014-12-30 MED ORDER — POTASSIUM CHLORIDE 10 MEQ/100ML IV SOLN
10.0000 meq | Freq: Once | INTRAVENOUS | Status: AC
  Administered 2014-12-30: 10 meq via INTRAVENOUS
  Filled 2014-12-30: qty 100

## 2014-12-30 MED ORDER — FLUTICASONE PROPIONATE HFA 110 MCG/ACT IN AERO
1.0000 | INHALATION_SPRAY | Freq: Two times a day (BID) | RESPIRATORY_TRACT | Status: DC
  Filled 2014-12-30 (×2): qty 12

## 2014-12-30 MED ORDER — ONDANSETRON HCL 4 MG/2ML IJ SOLN: 4.0000 mg | Freq: Four times a day (QID) | INTRAMUSCULAR | Status: DC | PRN

## 2014-12-30 NOTE — ED Notes (Signed)
T/C to [facility] - truck unavailable.  [facility] notified of need for ALS tranport.

## 2014-12-30 NOTE — ED Notes (Signed)
Sinus tach via monitor.  Pt continues without c/o dizziness, lightheadedness or pain.  [facility] notified of pt transport.

## 2014-12-30 NOTE — ED Notes (Signed)
Report given to ClevelandPaula, Charity fundraiserN.  Lab attempting for 2nd time to collect additional labs that were added.

## 2014-12-30 NOTE — ED Notes (Signed)
[facility]  C/o tachycardia, generalized weakness, and hypotension over the past 2-3 days.  [facility]  Denies pain and sob.

## 2014-12-30 NOTE — ED Notes (Signed)
Pottassium 2.6 critical value lab notifiation, MD Dr. Silverio LayYao notified.

## 2014-12-30 NOTE — ED Notes (Signed)
Report called to Jessica, ED Charge RN. 

## 2014-12-30 NOTE — ED Notes (Signed)
C/O rapid pulse rate and low blood pressure "pretty constant" x 2 days.  Denies any pain, SOB, dizziness.  C/O feeling tired.  Takes clonidine 2x/wk for HTN - last dose night before sxs started.  Skin W/D/P.  HR regular, 120's.

## 2014-12-30 NOTE — H&P (Signed)
[facility] History and Physical  Penny Barnett GEX:[id number] DOB: 06-15-1951 DOA: 12/30/2014   PCP: Lottie Dawson, MD  Specialists: None  Chief Complaint: Generalized weakness  HPI: Penny Barnett is a 64 y.o. female with a past medical history of asthma, hypertension, GERD, hyperlipidemia, who was in her usual state of health until this past Friday when that afternoon she started feeling poorly. Patient is not a very good historian and is unable to provide specific information. She tells me that she started feeling tired. But denies any chest pain, shortness of breath, nausea, vomiting at that time, fever, chills. She had one episode of vomiting couple of days ago when she had diet pepper but none other than that. No diarrhea. No leg swelling. No abdominal pain. She denies recent changes to her medications. She tells me that the plan is for her to cut back on her antihypertensives including the clonidine, but she hasn't done so yet. According to her medication reconciliation she may not be taking her clonidine every day. Again, information from the patient does not appear to be accurate. There are notes from the urgent care center, which mention that the last dose of her clonidine was on Thursday. This information from patient is not very reliable. She denies any focal weakness, no visual disturbances. No headaches. No recent illness or sickness. She has had very poor oral intake the last 4 days. She has been drinking some Gatorade. She also noted at home that her heart rate was high in the 120s to 130s. She also noted that her blood pressure was running low in the 01U to 272 systolic. She presented to the urgent care Center and then was promptly transferred over to the emergency department. In the ED, she is found to have hypokalemia. Tachycardia has not improved any despite IV fluids.  Home Medications: Prior to Admission medications   Medication Sig Start Date End Date Taking?  Authorizing Provider  albuterol (VENTOLIN HFA) 108 (90 BASE) MCG/ACT inhaler Inhale 2 puffs into the lungs every 6 (six) hours as needed for wheezing or shortness of breath. 11/14/14  Yes Burnis Medin, MD  cholecalciferol (VITAMIN D) 1000 UNITS tablet Take 1,000 Units by mouth daily.     Yes Historical Provider, MD  cloNIDine (CATAPRES) 0.1 MG tablet Take 1 tablet (0.1 mg total) by mouth daily. Patient taking differently: Take 0.1 mg by mouth daily. As of 12/30/14: pt takes twice weekly. 11/14/14  Yes Burnis Medin, MD  fluticasone (FLOVENT HFA) 110 MCG/ACT inhaler Inhale 1 puff into the lungs 2 (two) times daily. 10/16/13  Yes Burnis Medin, MD  hydrochlorothiazide (HYDRODIURIL) 25 MG tablet Take 1 tablet (25 mg total) by mouth daily. Patient taking differently: Take 25 mg by mouth 4 (four) times a week.  11/14/14  Yes Burnis Medin, MD  ipratropium (ATROVENT HFA) 17 MCG/ACT inhaler Inhale 2 puffs into the lungs every 6 (six) hours. 10/17/12  Yes Burnis Medin, MD  lansoprazole (PREVACID) 30 MG capsule Take 1 capsule (30 mg total) by mouth daily. 10/16/13  Yes Burnis Medin, MD  montelukast (SINGULAIR) 10 MG tablet Take 1 tablet (10 mg total) by mouth every morning. 11/14/14  Yes Burnis Medin, MD  Multiple Vitamins-Minerals (MULTIVITAMIN WITH MINERALS) tablet Take 1 tablet by mouth daily.     Yes Historical Provider, MD  quinapril (ACCUPRIL) 20 MG tablet Take 1 tablet (20 mg total) by mouth daily. 11/14/14  Yes Burnis Medin, MD  rosuvastatin (  CRESTOR) 10 MG tablet Take 1 tablet (10 mg total) by mouth daily. 1/2 daily Patient taking differently: Take 10 mg by mouth 3 (three) times a week.  04/13/11  Yes Burnis Medin, MD  theophylline (THEODUR) 300 MG 12 hr tablet Take 1 tablet (300 mg total) by mouth daily. 11/14/14  Yes Burnis Medin, MD  cyclobenzaprine (FLEXERIL) 5 MG tablet Take 1 tablet (5 mg total) by mouth 3 (three) times daily as needed for muscle spasms. 10/16/13   Burnis Medin, MD     Allergies:  Allergies  Allergen Reactions  . Penicillins Other (See Comments)    Childhood      Past Medical History: Past Medical History  Diagnosis Date  . Asthma     as a child and then attack 1996  poss allergic to cat hair. ventilated x 2 . saw Dr Donneta Romberg then .   Marland Kitchen GERD (gastroesophageal reflux disease)     2000 taking otc prevacid   evey day.   . Hypertension      3 meds controlled  . Hyperlipidemia   . Kidney stone     Past Surgical History  Procedure Laterality Date  . Cataract extraction  10/14/10    both eyes  . Shoulder surgery      frozen shoulder rt 2006, lf 2000 [facility].    Social History: She lives alone. No smoking. No history of alcohol use or illicit drug use. Usually independent with daily activities.  Family History:  Family History  Problem Relation Age of Onset  . Breast cancer Mother   . Cancer Mother     bladder  . Uterine cancer Sister   . Colon cancer Neg Hx   . Alcohol abuse Father      Review of Systems - History obtained from the patient General ROS: positive for  - fatigue Psychological ROS: negative Ophthalmic ROS: negative ENT ROS: negative Allergy and Immunology ROS: negative Hematological and Lymphatic ROS: negative Endocrine ROS: negative Respiratory ROS: no cough, shortness of breath, or wheezing Cardiovascular ROS: no chest pain or dyspnea on exertion Gastrointestinal ROS: no abdominal pain, change in bowel habits, or black or bloody stools Genito-Urinary ROS: no dysuria, trouble voiding, or hematuria Musculoskeletal ROS: negative Neurological ROS: no TIA or stroke symptoms Dermatological ROS: negative  Physical Examination  Filed Vitals:   12/30/14 1730 12/30/14 1745 12/30/14 1800 12/30/14 1815  BP: 117/75 114/77 109/77 111/78  Pulse: 139 140 141 140  Temp:      TempSrc:      Resp: '16 20 16 13  ' Height:      Weight:      SpO2: 97% 98% 98% 96%    BP 111/78 mmHg  Pulse 140  Temp(Src) 98.3 F (36.8 C)  (Oral)  Resp 13  Ht '5\' 5"'  (1.651 m)  Wt 77.111 kg (170 lb)  BMI 28.29 kg/m2  SpO2 96%  General appearance: alert, cooperative, appears stated age and no distress Head: Normocephalic, without obvious abnormality, atraumatic Eyes: conjunctivae/corneas clear. PERRL, EOM's intact.  Throat: lips, mucosa, and tongue normal; teeth and gums normal Neck: no adenopathy, no carotid bruit, no JVD, supple, symmetrical, trachea midline and thyroid not enlarged, symmetric, no tenderness/mass/nodules Back: symmetric, no curvature. ROM normal. No CVA tenderness. Resp: clear to auscultation bilaterally Cardio: S1, S2 is tachycardic. Regular. No S3, S4. No rubs, murmurs or bruit. No pedal edema. GI: soft, non-tender; bowel sounds normal; no masses,  no organomegaly Extremities: extremities normal, atraumatic, no cyanosis or  edema Pulses: 2+ and symmetric Skin: Skin color, texture, turgor normal. No rashes or lesions Lymph nodes: Cervical, supraclavicular, and axillary nodes normal. Neurologic: Alert and oriented 3. Cranial nerves II-12 intact. No focal neurological deficits.  Laboratory Data: Results for orders placed or performed during the hospital encounter of 12/30/14 (from the past 48 hour(s))  CBC with Differential     Status: Abnormal   Collection Time: 12/30/14  5:13 PM  Result Value Ref Range   WBC 3.3 (L) 4.0 - 10.5 K/uL   RBC 4.75 3.87 - 5.11 MIL/uL   Hemoglobin 12.6 12.0 - 15.0 g/dL   HCT 38.2 36.0 - 46.0 %   MCV 80.4 78.0 - 100.0 fL   MCH 26.5 26.0 - 34.0 pg   MCHC 33.0 30.0 - 36.0 g/dL   RDW 14.7 11.5 - 15.5 %   Platelets 140 (L) 150 - 400 K/uL   Neutrophils Relative % 77 43 - 77 %   Neutro Abs 2.5 1.7 - 7.7 K/uL   Lymphocytes Relative 16 12 - 46 %   Lymphs Abs 0.5 (L) 0.7 - 4.0 K/uL   Monocytes Relative 7 3 - 12 %   Monocytes Absolute 0.2 0.1 - 1.0 K/uL   Eosinophils Relative 0 0 - 5 %   Eosinophils Absolute 0.0 0.0 - 0.7 K/uL   Basophils Relative 0 0 - 1 %   Basophils  Absolute 0.0 0.0 - 0.1 K/uL  Comprehensive metabolic panel     Status: Abnormal   Collection Time: 12/30/14  5:13 PM  Result Value Ref Range   Sodium 134 (L) 135 - 145 mmol/L   Potassium 2.6 (LL) 3.5 - 5.1 mmol/L    Comment: REPEATED TO VERIFY CRITICAL RESULT CALLED TO, READ BACK BY AND VERIFIED WITH: RN GOFF,C AT 1816 [id number] MARTINB    Chloride 99 (L) 101 - 111 mmol/L   CO2 27 22 - 32 mmol/L   Glucose, Bld 113 (H) 65 - 99 mg/dL   BUN 15 6 - 20 mg/dL   Creatinine, Ser 0.72 0.44 - 1.00 mg/dL   Calcium 8.1 (L) 8.9 - 10.3 mg/dL   Total Protein 6.1 (L) 6.5 - 8.1 g/dL   Albumin 2.9 (L) 3.5 - 5.0 g/dL   AST 21 15 - 41 U/L   ALT 21 14 - 54 U/L   Alkaline Phosphatase 65 38 - 126 U/L   Total Bilirubin 0.6 0.3 - 1.2 mg/dL   GFR calc non Af Amer >60 >60 mL/min   GFR calc Af Amer >60 >60 mL/min    Comment: (NOTE) The eGFR has been calculated using the CKD EPI equation. This calculation has not been validated in all clinical situations. eGFR's persistently <60 mL/min signify possible Chronic Kidney Disease.    Anion gap 8 5 - 15  Troponin I     Status: None   Collection Time: 12/30/14  5:47 PM  Result Value Ref Range   Troponin I 0.03 <0.031 ng/mL    Comment:        NO INDICATION OF MYOCARDIAL INJURY.     Radiology Reports: No results found.  My interpretation of Electrocardiogram: Sinus tachycardia in the 140s. Normal axis. Intervals appear to be normal. Nonspecific T wave changes. No concerning ST changes.  Problem List  Principal Problem:   Sinus tachycardia Active Problems:   Asthma   GERD (gastroesophageal reflux disease)   Hypertension   Hyperlipidemia   Fatigue   Hypokalemia   Assessment: This is a 64 year old Caucasian female  who comes in with weakness, fatigue. Found to have hypokalemia and noted to be tachycardic. Blood pressure has been normal in the emergency department. Etiology for her presentation is not entirely clear. This is confounded by the fact  that the patient is not a very great historian and may not be accurately providing information regarding her medications. If indeed she has been weaned off of her clonidine, her presentation could be related to medication. Thyroid disease is a possibility, although her TSH back in May was normal. She denies being on any narcotics or anxiolytics. So unlikely she is withdrawing from any of those agents. However it's possible she may be withdrawing from clonidine, but then we should see rebound hypertension. So, in summary, etiology is not entirely clear.  Plan: #1 Sinus tachycardia: Since history is nonspecific and patient is a poor historian. We will need to do a broad workup. This will include chest x-ray, echocardiogram, d-dimer troponins. We'll repeat a TSH. Monitor her on telemetry. Continue to give her IV fluids. EKG will be repeated.   #2 severe hypokalemia: Probably caused in part by use of diuretics. Hold her diuretics. Replace potassium orally. Check magnesium. Repeat labs this evening and tomorrow morning. Check cortisol level in the morning.  #3 Generalized fatigue/weakness: No focal neurological deficits are appreciated. Weakness, likely secondary to the above. Monitor for now. PT and OT when heart rate is better.  #4 history of asthma: Stable. No wheezing appreciated. She denies excessive use of inhalers. She is noted to be on theophylline which can cause arrhythmias. We will hold this medication.  #5 history of essential hypertension: Hold her antihypertensives medications for now.  #6 history of GERD: Continue PPI  DVT Prophylaxis: Lovenox Code Status: Full code Family Communication: Discussed with the patient  Disposition Plan: Admit to telemetry   Further management decisions will depend on results of further testing and patient's response to treatment.   [facility]  [facility] Pager [phone]  If 7PM-7AM, please contact night-coverage [email or web address] Password  [email or web address]  12/30/2014, 7:01 PM

## 2014-12-30 NOTE — ED Notes (Signed)
Dr. Silverio LayYao notified of HR 140 and 2nd liter bolus completed.  Orders received for additional liter bolus.

## 2014-12-30 NOTE — ED Notes (Signed)
Dr. Silverio LayYao notified of continued HR in 140s.  2nd liter of NS ordered.

## 2014-12-30 NOTE — Progress Notes (Signed)
64yo female admitted w/ generalized weakness, ST, and hypokalemia, now w/ abnormal UA showing many bacteria, to begin IV ABX.  Will start Cipro 400mg  IV Q12H for CrCl ~70 ml/min and monitor CBC and Cx.  Vernard GamblesVeronda Ndia Sampath, PharmD, BCPS 12/30/2014 11:08 PM

## 2014-12-30 NOTE — ED Provider Notes (Signed)
CSN: [id number]     Arrival date & time 12/30/14  1317 History   First MD Initiated Contact with Patient 12/30/14 1402     Chief Complaint  Patient presents with  . Tachycardia  . Hypotension   (Consider location/radiation/quality/duration/timing/severity/associated sxs/prior Treatment) HPI  She is a 64 year old woman here for evaluation of tachycardia and low blood pressure. She states she was feeling normal and well up until Friday evening. At that time, she started feeling fatigued. She checked her blood pressure and it was in the 90s systolic. It also in recorded a pulse in the 120s. She states that since Friday, her pulse has remained 105-140. She denies feeling palpitations.  Her blood pressure has also been running in the 90s and 100s systolic. She is on blood pressure medication. Her clonidine is being weaned, last dose of this was Thursday. She denies any chest pain, shortness of breath, dizziness. No pain or numbness in her arms. No diaphoresis. She did have one episode of nausea and vomiting yesterday. No fevers or cough. No injury or trauma.  Past Medical History  Diagnosis Date  . Asthma     as a child and then attack 1996  poss allergic to cat hair. ventilated x 2 . saw Dr Carmel Hamlet Callas then .   Marland Kitchen GERD (gastroesophageal reflux disease)     2000 taking otc prevacid   evey day.   . Hypertension      3 meds controlled  . Hyperlipidemia   . Kidney stone    Past Surgical History  Procedure Laterality Date  . Cataract extraction  10/14/10    both eyes  . Shoulder surgery      frozen shoulder rt 2006, lf 2000 [facility].   Family History  Problem Relation Age of Onset  . Breast cancer Mother   . Cancer Mother     bladder  . Uterine cancer Sister   . Colon cancer Neg Hx   . Alcohol abuse Father    History  Substance Use Topics  . Smoking status: Former Smoker    Quit date: 06/21/1986  . Smokeless tobacco: Never Used  . Alcohol Use: No   OB History    No data available      Review of Systems As in history of present illness Allergies  Penicillins  Home Medications   Prior to Admission medications   Medication Sig Start Date End Date Taking? Authorizing Provider  cloNIDine (CATAPRES) 0.1 MG tablet Take 1 tablet (0.1 mg total) by mouth daily. Patient taking differently: Take 0.1 mg by mouth daily. As of 12/30/14: pt takes twice weekly. 11/14/14  Yes Madelin Headings, MD  lansoprazole (PREVACID) 30 MG capsule Take 1 capsule (30 mg total) by mouth daily. 10/16/13  Yes Madelin Headings, MD  montelukast (SINGULAIR) 10 MG tablet Take 1 tablet (10 mg total) by mouth every morning. 11/14/14  Yes Madelin Headings, MD  theophylline (THEODUR) 300 MG 12 hr tablet Take 1 tablet (300 mg total) by mouth daily. 11/14/14  Yes Madelin Headings, MD  albuterol (VENTOLIN HFA) 108 (90 BASE) MCG/ACT inhaler Inhale 2 puffs into the lungs every 6 (six) hours as needed for wheezing or shortness of breath. 11/14/14   Madelin Headings, MD  cholecalciferol (VITAMIN D) 1000 UNITS tablet Take 1,000 Units by mouth daily.      Historical Provider, MD  cyclobenzaprine (FLEXERIL) 5 MG tablet Take 1 tablet (5 mg total) by mouth 3 (three) times daily as needed for muscle  spasms. 10/16/13   Madelin HeadingsWanda K Panosh, MD  fluticasone (FLOVENT HFA) 110 MCG/ACT inhaler Inhale 1 puff into the lungs 2 (two) times daily. 10/16/13   Madelin HeadingsWanda K Panosh, MD  hydrochlorothiazide (HYDRODIURIL) 25 MG tablet Take 1 tablet (25 mg total) by mouth daily. 11/14/14   Madelin HeadingsWanda K Panosh, MD  ipratropium (ATROVENT HFA) 17 MCG/ACT inhaler Inhale 2 puffs into the lungs every 6 (six) hours. 10/17/12   Madelin HeadingsWanda K Panosh, MD  Multiple Vitamins-Minerals (MULTIVITAMIN WITH MINERALS) tablet Take 1 tablet by mouth daily.      Historical Provider, MD  quinapril (ACCUPRIL) 20 MG tablet Take 1 tablet (20 mg total) by mouth daily. 11/14/14   Madelin HeadingsWanda K Panosh, MD  rosuvastatin (CRESTOR) 10 MG tablet Take 1 tablet (10 mg total) by mouth daily. 1/2 daily 04/13/11   Madelin HeadingsWanda K Panosh, MD    BP 103/57 mmHg  Pulse 140  Temp(Src) 98.6 F (37 C) (Oral)  Resp 18  SpO2 97% Physical Exam  Constitutional: She is oriented to person, place, and time. She appears well-developed and well-nourished. No distress.  appears slightly anxious with an occasional fine tremor in her hands  Neck: Neck supple.  Cardiovascular: Regular rhythm and normal heart sounds.   No murmur heard. Tachycardic around 130  Pulmonary/Chest: Effort normal and breath sounds normal. No respiratory distress. She has no wheezes. She has no rales.  Neurological: She is alert and oriented to person, place, and time.    ED Course  Procedures (including critical care time) ED ECG REPORT   Date: 12/30/2014  Rate: 138  Rhythm: sinus tachycardia  QRS Axis: normal  Intervals: normal  ST/T Wave abnormalities: nonspecific ST changes and nonspecific T wave changes  Conduction Disutrbances:none  Narrative Interpretation: sinus tachycardia with nonspecific ST and T wave changes  Old EKG Reviewed: changes noted  I have personally reviewed the EKG tracing and agree with the computerized printout as noted.  Labs Review Labs Reviewed  POCT I-STAT, CHEM 8 - Abnormal; Notable for the following:    Potassium 2.6 (*)    Chloride 96 (*)    Glucose, Bld 120 (*)    Calcium, Ion 1.03 (*)    Hemoglobin 15.6 (*)    All other components within normal limits    Imaging Review No results found.   MDM   1. Tachycardia   2. Other fatigue    I have no good explanation for her tachycardia. She is not febrile, she denies any pain, no anemia or dehydration. [facility] via EMS for evaluation of hypokalemia and tachycardia.    Charm RingsErin J Kinshasa Throckmorton, MD 12/30/14 1455

## 2014-12-30 NOTE — ED Provider Notes (Signed)
CSN: [id number]     Arrival date & time 12/30/14  1535 History   First MD Initiated Contact with Patient 12/30/14 1539     Chief Complaint  Patient presents with  . Tachycardia     (Consider location/radiation/quality/duration/timing/severity/associated sxs/prior Treatment) The history is provided by the patient.  Penny Barnett is a 64 y.o. female hx of GERD, HTN, HL here with verbalized weakness, tachycardia, hypokalemia. Patient has been feeling otherwise weakness for the last several days. Has poor appetite so didn't eat much for the last 3 days. Had one episode of vomiting yesterday. Denies any diarrhea or fevers. She went to urgent care was noted to be tachycardic in the 130s. Also noticed that her potassium was 2.6. Denies any sick contacts.   Past Medical History  Diagnosis Date  . Asthma     as a child and then attack 1996  poss allergic to cat hair. ventilated x 2 . saw Dr [place] Callas then .   Marland Kitchen GERD (gastroesophageal reflux disease)     2000 taking otc prevacid   evey day.   . Hypertension      3 meds controlled  . Hyperlipidemia   . Kidney stone    Past Surgical History  Procedure Laterality Date  . Cataract extraction  10/14/10    both eyes  . Shoulder surgery      frozen shoulder rt 2006, lf 2000 [facility].   Family History  Problem Relation Age of Onset  . Breast cancer Mother   . Cancer Mother     bladder  . Uterine cancer Sister   . Colon cancer Neg Hx   . Alcohol abuse Father    History  Substance Use Topics  . Smoking status: Former Smoker    Quit date: 06/21/1986  . Smokeless tobacco: Never Used  . Alcohol Use: No   OB History    No data available     Review of Systems  Gastrointestinal: Positive for vomiting.  Neurological: Positive for weakness.  All other systems reviewed and are negative.     Allergies  Penicillins  Home Medications   Prior to Admission medications   Medication Sig Start Date End Date Taking? Authorizing Provider   albuterol (VENTOLIN HFA) 108 (90 BASE) MCG/ACT inhaler Inhale 2 puffs into the lungs every 6 (six) hours as needed for wheezing or shortness of breath. 11/14/14  Yes Madelin Headings, MD  cholecalciferol (VITAMIN D) 1000 UNITS tablet Take 1,000 Units by mouth daily.     Yes Historical Provider, MD  cloNIDine (CATAPRES) 0.1 MG tablet Take 1 tablet (0.1 mg total) by mouth daily. Patient taking differently: Take 0.1 mg by mouth daily. As of 12/30/14: pt takes twice weekly. 11/14/14  Yes Madelin Headings, MD  fluticasone (FLOVENT HFA) 110 MCG/ACT inhaler Inhale 1 puff into the lungs 2 (two) times daily. 10/16/13  Yes Madelin Headings, MD  hydrochlorothiazide (HYDRODIURIL) 25 MG tablet Take 1 tablet (25 mg total) by mouth daily. Patient taking differently: Take 25 mg by mouth 4 (four) times a week.  11/14/14  Yes Madelin Headings, MD  ipratropium (ATROVENT HFA) 17 MCG/ACT inhaler Inhale 2 puffs into the lungs every 6 (six) hours. 10/17/12  Yes Madelin Headings, MD  lansoprazole (PREVACID) 30 MG capsule Take 1 capsule (30 mg total) by mouth daily. 10/16/13  Yes Madelin Headings, MD  montelukast (SINGULAIR) 10 MG tablet Take 1 tablet (10 mg total) by mouth every morning. 11/14/14  Yes Neta Mends  Panosh, MD  Multiple Vitamins-Minerals (MULTIVITAMIN WITH MINERALS) tablet Take 1 tablet by mouth daily.     Yes Historical Provider, MD  quinapril (ACCUPRIL) 20 MG tablet Take 1 tablet (20 mg total) by mouth daily. 11/14/14  Yes Madelin Headings, MD  rosuvastatin (CRESTOR) 10 MG tablet Take 1 tablet (10 mg total) by mouth daily. 1/2 daily Patient taking differently: Take 10 mg by mouth 3 (three) times a week.  04/13/11  Yes Madelin Headings, MD  theophylline (THEODUR) 300 MG 12 hr tablet Take 1 tablet (300 mg total) by mouth daily. 11/14/14  Yes Madelin Headings, MD  cyclobenzaprine (FLEXERIL) 5 MG tablet Take 1 tablet (5 mg total) by mouth 3 (three) times daily as needed for muscle spasms. 10/16/13   Madelin Headings, MD   BP 111/78 mmHg  Pulse 140   Temp(Src) 98.3 F (36.8 C) (Oral)  Resp 13  Ht 5\' 5"  (1.651 m)  Wt 170 lb (77.111 kg)  BMI 28.29 kg/m2  SpO2 96% Physical Exam  Constitutional: She is oriented to person, place, and time.  Dehydrated   HENT:  Head: Normocephalic.  MM dry   Eyes: Conjunctivae are normal. Pupils are equal, round, and reactive to light.  Neck: Normal range of motion. Neck supple.  Cardiovascular: Regular rhythm and normal heart sounds.   Tachycardic   Pulmonary/Chest: Effort normal and breath sounds normal. No respiratory distress. She has no wheezes. She has no rales.  Abdominal: Soft. Bowel sounds are normal. She exhibits no distension. There is no tenderness. There is no rebound and no guarding.  Musculoskeletal: Normal range of motion. She exhibits no edema or tenderness.  Neurological: She is alert and oriented to person, place, and time. No cranial nerve deficit. Coordination normal.  Skin: Skin is warm and dry.  Psychiatric: She has a normal mood and affect. Her behavior is normal. Judgment and thought content normal.  Nursing note and vitals reviewed.   ED Course  Procedures (including critical care time) Labs Review Labs Reviewed  CBC WITH DIFFERENTIAL/PLATELET - Abnormal; Notable for the following:    WBC 3.3 (*)    Platelets 140 (*)    Lymphs Abs 0.5 (*)    All other components within normal limits  COMPREHENSIVE METABOLIC PANEL - Abnormal; Notable for the following:    Sodium 134 (*)    Potassium 2.6 (*)    Chloride 99 (*)    Glucose, Bld 113 (*)    Calcium 8.1 (*)    Total Protein 6.1 (*)    Albumin 2.9 (*)    All other components within normal limits  URINALYSIS, ROUTINE W REFLEX MICROSCOPIC (NOT AT [facility])  TROPONIN I    Imaging Review No results found.   EKG Interpretation   Date/Time:  Sunday December 30 2014 15:42:12 EDT Ventricular Rate:  143 PR Interval:  111 QRS Duration: 74 QT Interval:  289 QTC Calculation: 446 R Axis:   27 Text Interpretation:  Sinus  tachycardia Borderline T abnormalities,  inferior leads No previous ECGs available Confirmed by Nidia Grogan  MD, Shadeed Colberg  ([id number]) on 12/30/2014 3:46:08 PM      MDM   Final diagnoses:  None   Kiaja A Nordahl is a 64 y.o. female here with dehydration, hypokalemia. Hypokalemia likely from dec PO intake and she only had one episode of vomiting and no diarrhea. Will recheck labs and hydrate and replace K. No chest pain or SOB to suggest PE.   6:29 PM K 2.6, supplemented.  After 3 L NS still tachy 140s. No vomiting or diarrhea. Plan to admit for observation for dehydration, hypokalemia.     Richardean Canalavid H Alani Lacivita, MD 12/30/14 [id number]

## 2014-12-31 ENCOUNTER — Inpatient Hospital Stay (HOSPITAL_COMMUNITY): Payer: Medicare Other

## 2014-12-31 DIAGNOSIS — I4892 Unspecified atrial flutter: Secondary | ICD-10-CM | POA: Diagnosis present

## 2014-12-31 DIAGNOSIS — I471 Supraventricular tachycardia: Secondary | ICD-10-CM

## 2014-12-31 DIAGNOSIS — I82409 Acute embolism and thrombosis of unspecified deep veins of unspecified lower extremity: Secondary | ICD-10-CM

## 2014-12-31 LAB — COMPREHENSIVE METABOLIC PANEL
ALT: 16 U/L (ref 14–54)
AST: 15 U/L (ref 15–41)
Albumin: 2.4 g/dL — ABNORMAL LOW (ref 3.5–5.0)
Alkaline Phosphatase: 55 U/L (ref 38–126)
Anion gap: 6 (ref 5–15)
BUN: 8 mg/dL (ref 6–20)
CO2: 23 mmol/L (ref 22–32)
Calcium: 7.3 mg/dL — ABNORMAL LOW (ref 8.9–10.3)
Chloride: 110 mmol/L (ref 101–111)
Creatinine, Ser: 0.58 mg/dL (ref 0.44–1.00)
GFR calc Af Amer: >60 mL/min (ref >60)
Glucose, Bld: 104 mg/dL — ABNORMAL HIGH (ref 65–99)
POTASSIUM: 3.3 mmol/L — AB (ref 3.5–5.1)
Sodium: 139 mmol/L (ref 135–145)
Total Bilirubin: 0.3 mg/dL (ref 0.3–1.2)
Total Protein: 5.4 g/dL — ABNORMAL LOW (ref 6.5–8.1)

## 2014-12-31 LAB — CBC
HEMATOCRIT: 33.1 % — AB (ref 36.0–46.0)
Hemoglobin: 10.9 g/dL — ABNORMAL LOW (ref 12.0–15.0)
MCH: 26.7 pg (ref 26.0–34.0)
MCHC: 32.9 g/dL (ref 30.0–36.0)
MCV: 80.9 fL (ref 78.0–100.0)
Platelets: 126 10*3/uL — ABNORMAL LOW (ref 150–400)
RBC: 4.09 MIL/uL (ref 3.87–5.11)
RDW: 15.1 % (ref 11.5–15.5)
WBC: 2.5 10*3/uL — AB (ref 4.0–10.5)

## 2014-12-31 LAB — CORTISOL-AM, BLOOD: Cortisol - AM: 14.9 ug/dL (ref 6.7–22.6)

## 2014-12-31 LAB — TROPONIN I
Troponin I: 0.03 ng/mL (ref <0.031)
Troponin I: <0.03 ng/mL (ref <0.031)

## 2014-12-31 LAB — MAGNESIUM: Magnesium: 1.8 mg/dL (ref 1.7–2.4)

## 2014-12-31 MED ORDER — KETOROLAC TROMETHAMINE 30 MG/ML IJ SOLN
30.0000 mg | Freq: Once | INTRAMUSCULAR | Status: AC
  Administered 2014-12-31: 30 mg via INTRAVENOUS
  Filled 2014-12-31: qty 1

## 2014-12-31 MED ORDER — METOCLOPRAMIDE HCL 5 MG/ML IJ SOLN
10.0000 mg | Freq: Once | INTRAMUSCULAR | Status: AC
  Administered 2014-12-31: 10 mg via INTRAVENOUS
  Filled 2014-12-31: qty 2

## 2014-12-31 MED ORDER — PANTOPRAZOLE SODIUM 40 MG PO TBEC
40.0000 mg | DELAYED_RELEASE_TABLET | Freq: Every day | ORAL | Status: DC
  Administered 2014-12-31 – 2015-01-04 (×5): 40 mg via ORAL
  Filled 2014-12-31 (×5): qty 1

## 2014-12-31 MED ORDER — DILTIAZEM LOAD VIA INFUSION
10.0000 mg | Freq: Once | INTRAVENOUS | Status: AC
  Administered 2014-12-31: 10 mg via INTRAVENOUS
  Filled 2014-12-31: qty 10

## 2014-12-31 MED ORDER — DILTIAZEM HCL 25 MG/5ML IV SOLN
5.0000 mg | Freq: Once | INTRAVENOUS | Status: AC
  Administered 2014-12-31: 5 mg via INTRAVENOUS
  Filled 2014-12-31: qty 5

## 2014-12-31 MED ORDER — SODIUM CHLORIDE 0.9 % IV BOLUS (SEPSIS)
500.0000 mL | Freq: Once | INTRAVENOUS | Status: AC
  Administered 2014-12-31: 500 mL via INTRAVENOUS

## 2014-12-31 MED ORDER — DILTIAZEM HCL 100 MG IV SOLR
5.0000 mg/h | INTRAVENOUS | Status: DC
  Administered 2014-12-31: 10 mg/h via INTRAVENOUS
  Administered 2014-12-31: 17.5 mg/h via INTRAVENOUS
  Administered 2015-01-01: 15 mg/h via INTRAVENOUS
  Administered 2015-01-01 – 2015-01-02 (×3): 10 mg/h via INTRAVENOUS
  Administered 2015-01-03: 15 mg/h via INTRAVENOUS
  Filled 2014-12-31: qty 100

## 2014-12-31 MED ORDER — POTASSIUM CHLORIDE CRYS ER 10 MEQ PO TBCR
40.0000 meq | EXTENDED_RELEASE_TABLET | Freq: Once | ORAL | Status: AC
  Administered 2014-12-31: 40 meq via ORAL
  Filled 2014-12-31: qty 4

## 2014-12-31 MED ORDER — METOPROLOL TARTRATE 1 MG/ML IV SOLN
5.0000 mg | INTRAVENOUS | Status: DC | PRN
  Administered 2014-12-31: 5 mg via INTRAVENOUS
  Filled 2014-12-31: qty 5

## 2014-12-31 MED ORDER — GI COCKTAIL ~~LOC~~
30.0000 mL | Freq: Three times a day (TID) | ORAL | Status: DC | PRN
  Filled 2014-12-31: qty 30

## 2014-12-31 MED ORDER — DIPHENHYDRAMINE HCL 50 MG/ML IJ SOLN
25.0000 mg | Freq: Once | INTRAMUSCULAR | Status: AC
  Administered 2014-12-31: 25 mg via INTRAVENOUS
  Filled 2014-12-31: qty 1

## 2014-12-31 MED ORDER — POTASSIUM CHLORIDE CRYS ER 20 MEQ PO TBCR
40.0000 meq | EXTENDED_RELEASE_TABLET | Freq: Once | ORAL | Status: DC
  Filled 2014-12-31: qty 2

## 2014-12-31 NOTE — Progress Notes (Signed)
VASCULAR LAB PRELIMINARY  PRELIMINARY  PRELIMINARY  PRELIMINARY  Bilateral lower extremity venous duplex  completed.    Preliminary report:  Bilateral:  No evidence of DVT, superficial thrombosis, or Baker's Cyst.    Arless Vineyard, RVT 12/31/2014, 11:44 AM

## 2014-12-31 NOTE — Progress Notes (Addendum)
NP Claiborne Billingsallahan notified of pt heartrate sustaining high 140s sinus tachycardia, SBP 120's, pt asymptomatic & resting comfortably. Will continue to monitor closely.  Addendum 0148: new order for EKG carried out, shows sinus tachycardia 147bpm, NP Claiborne Billingsallahan notified of results, will continue to monitor closely.  Addendum 0230: NP Claiborne Billingsallahan notified; pt ST 147-148bpm, new order received for 500 NS bolus.  Addendum 0430: NP Claiborne Billingsallahan notified; pt ST 147-148bpm, new order received for 5mg  IV Cardizem.   Addendum 0502: NP Callahan notified; pt HR now fluctuating ST between 117-124bpm.   Addendum: 9811: 0513: NP Claiborne Billingsallahan notified, pt HR sustaining 148bpm. Will continue to monitor closely.

## 2014-12-31 NOTE — Progress Notes (Signed)
Patient would like Ibuprofen for headache, says Tylenol given earlier did not help. Paged K. Shorr with [facility]. Awaiting new orders.

## 2014-12-31 NOTE — Progress Notes (Signed)
Patient ID: Penny Barnett, female   DOB: [birth year], 64 y.o.   MRN: [id number]  [facility] PROGRESS NOTE  SAMMY DOUTHITT WUJ:[id number] DOB: December 15, 1950 DOA: 12/30/2014 PCP: Lorretta Harp, MD   Brief narrative:    64 y.o. female with asthma, hypertension, GERD, hyperlipidemia, who was in her usual state of health until this past Friday when that afternoon she started feeling poorly. She noted at home that her heart rate was high in the 120s to 130s. She also noted that her blood pressure was running low in the 90s to 100 systolic.   Assessment/Plan:     Atrial flutter, CHADS-VAS score 3 - appreciate cardiology team following  - pt to be started on Cardizem IV with possible transition to oral Cardizem in AM - pt to think about AC, will hold off for now until pt makes decision - pt will need to be fully [facility] for 3-4 weeks with possible consideration of cardioversion if still in a- flutter    Fatigue - unclear etiology  - she has mild degree of pancytopenia so this may need to be further evaluated - also pt was somewhat dehydrated on admission based on Hg of 15.6 and sudden drop to 10.9 with IVF  - also ? UTI contributing  - will repeat CBC in AM and will make further recommendations - continue IVF for now  - PT evaluation requested     UTI - continue Cipro day #2 - follow up on urine culture     Hypokalemia - will supplement, repeat BMP in AM    Hypertension - BP has been stable for now with SBP in 110's     Obesity  - Body mass index is 30.3 kg/(m^2)  DVT prophylaxis - Lovenox SQ  Code Status: Full.  Family Communication:  plan of care discussed with the patient Disposition Plan: Home when stable.   IV access:  Peripheral IV  Procedures and diagnostic studies:    Ct Angio Chest Pe W/cm &/or Wo Cm 12/31/2014  No evidence of pulmonary embolus. 2. Nonspecific mildly mosaic pattern of parenchymal attenuation  Dg Chest [facility] 12/30/2014  No active  disease.    Medical Consultants:  Cardiology   Other Consultants:  PT  IAnti-Infectives:   Cipro 7/18 -->  Debbora Presto, MD  [facility] Pager [phone]  If 7PM-7AM, please contact night-coverage [email or web address] Password [email or web address] 12/31/2014, 8:59 PM   LOS: 1 day   HPI/Subjective: No events overnight.   Objective: Filed Vitals:   12/31/14 1515 12/31/14 1906 12/31/14 1957 12/31/14 2045  BP: 111/68 97/60  116/76  Pulse:   102   Temp: 98.5 F (36.9 C)     TempSrc: Oral     Resp: Height:      Weight:      SpO2: 99%   94%    Intake/Output Summary (Last 24 hours) at 12/31/14 2059 Last data filed at 12/31/14 1618  Gross per 24 hour  Intake    440 ml  Output    800 ml  Net   -360 ml    Exam:   General:  Pt is alert, follows commands appropriately, not in acute distress  Cardiovascular: Irregular rate and rhythm, no rubs, no gallops  Respiratory: Clear to auscultation bilaterally, no wheezing, no crackles, no rhonchi  Abdomen: Soft, non tender, non distended, bowel sounds present, no guarding  Extremities: No edema, pulses DP and PT palpable bilaterally  Neuro: Grossly nonfocal  Data Reviewed:  Basic Metabolic Panel:  Recent Labs Lab 12/30/14 1441 12/30/14 1713 12/30/14 1949 12/30/14 2101 12/31/14 0644  NA 135 134*  --  135 139  K 2.6* 2.6*  --  3.0* 3.3*  CL 96* 99*  --  104 110  CO2  --  27  --  24 23  GLUCOSE 120* 113*  --  115* 104*  BUN 19 15  --  13 8  CREATININE 0.70 0.72  --  0.57 0.58  CALCIUM  --  8.1*  --  7.4* 7.3*  MG  --   --  1.9  --  1.8   Liver Function Tests:  Recent Labs Lab 12/30/14 1713 12/31/14 0644  AST 21 15  ALT 21 16  ALKPHOS 65 55  BILITOT 0.6 0.3  PROT 6.1* 5.4*  ALBUMIN 2.9* 2.4*   CBC:  Recent Labs Lab 12/30/14 1441 12/30/14 1713 12/31/14 0644  WBC  --  3.3* 2.5*  NEUTROABS  --  2.5  --   HGB 15.6* 12.6 10.9*  HCT 46.0 38.2 33.1*  MCV  --  80.4 80.9  PLT  --  140* 126*   Cardiac  Enzymes:  Recent Labs Lab 12/30/14 1747 12/30/14 2101 12/31/14 0214 12/31/14 0644  TROPONINI 0.03 0.05* 0.03 <0.03   Scheduled Meds: . budesonide (PULMICORT) nebulizer solution  0.25 mg Nebulization BID  . ciprofloxacin  400 mg Intravenous Q12H  . enoxaparin (LOVENOX) injection  40 mg Subcutaneous Q24H  . ipratropium  0.5 mg Inhalation BID  . pantoprazole  40 mg Oral Daily  . sodium chloride  3 mL Intravenous Q12H   Continuous Infusions: . sodium chloride 1,000 mL (12/30/14 2153)  . diltiazem (CARDIZEM) infusion 15 mg/hr (12/31/14 1906)

## 2014-12-31 NOTE — Consult Note (Signed)
CARDIOLOGY CONSULT NOTE   Patient ID: Penny Barnett MRN: [id number] DOB/AGE: [birth year] 64 y.o.  Admit Date: 12/30/2014  Primary Physician: Lorretta Harp, MD Primary Cardiologist    Narda Rutherford   Clinical Summary Ms. Taflinger is a 64 y.o.female. She is admitted with feeling poorly. She is not a good historian. She's had decreased oral intake over the past several days. There has been inconsistency in taking her clonidine. She was noted to have a heart rate in the range of 140 in the emergency room. Up to this point it is been called sinus tachycardia. The rhythm is atrial flutter. The patient did receive 1 dose of IV diltiazem during the night. There was slowing of her heart rate for a brief period of time to the range of 120. Flutter waves are seen at that time. The only prior EKG we have is from 2011. She was in sinus rhythm at that time. She does not feel her rapid heartbeat. Before this illness she was very active working with her dogs and horses. She does not have any exertional symptoms. She has not had syncope or presyncope. There is no known prior cardiac history. Her initial labs included significant hypokalemia. She is receiving volume replacement and potassium. CT scan of the chest in the emergency room revealed no evidence of pulmonary emboli.   Allergies  Allergen Reactions  . Penicillins Other (See Comments)    Childhood      Medications Scheduled Medications: . budesonide (PULMICORT) nebulizer solution  0.25 mg Nebulization BID  . ciprofloxacin  400 mg Intravenous Q12H  . enoxaparin (LOVENOX) injection  40 mg Subcutaneous Q24H  . ipratropium  0.5 mg Inhalation BID  . pantoprazole  40 mg Oral QHS  . sodium chloride  3 mL Intravenous Q12H     Infusions: . sodium chloride 1,000 mL (12/30/14 2153)     PRN Medications:  acetaminophen **OR** acetaminophen, metoprolol, ondansetron **OR** ondansetron (ZOFRAN) IV   Past Medical History  Diagnosis Date   . GERD (gastroesophageal reflux disease)     2000 taking otc prevacid   evey day.   . Hypertension      3 meds controlled  . Hyperlipidemia   . Kidney stone   . Asthma     as a child and then attack 1996  poss allergic to cat hair. ventilated x 2 . saw Dr [place] Callas then .     Past Surgical History  Procedure Laterality Date  . Cataract extraction  10/14/10    both eyes  . Shoulder surgery      frozen shoulder rt 2006, lf 2000 [facility].    Family History  Problem Relation Age of Onset  . Breast cancer Mother   . Cancer Mother     bladder  . Uterine cancer Sister   . Colon cancer Neg Hx   . Alcohol abuse Father     Social History Ms. Urbanek reports that she quit smoking about 28 years ago. She has never used smokeless tobacco. Ms. Rawles reports that she does not drink alcohol.  Review of Systems At this time the patient denies fever, chills, headache, sweats, rash, change in vision, change in hearing, chest pain, cough, urinary symptoms. All other systems are reviewed and are negative.  Physical Examination Blood pressure 102/75, pulse 143, temperature 98.5 F (36.9 C), temperature source Oral, resp. rate 14, height  (1.651 m), weight 182 lb 1.6 oz (82.6 kg), SpO2 99 %.  Intake/Output  Summary (Last 24 hours) at 12/31/14 1100 Last data filed at 12/31/14 0546  Gross per 24 hour  Intake   3740 ml  Output    450 ml  Net   3290 ml   Patient is oriented to person time and place. Affect is normal. Head is atraumatic. Sclera and conjunctiva are normal. There is no jugular venous distention. Lungs are clear. Respiratory effort is unlabored. Cardiac exam was S1 and S2. There is a rapid heart rate. The abdomen is soft. There is no peripheral edema. There are no musculoskeletal deformities. There are no skin rashes.  Prior Cardiac Testing/Procedures  Lab Results  Basic Metabolic Panel:  Recent Labs Lab 12/30/14 1441 12/30/14 1713 12/30/14 1949 12/30/14 2101  12/31/14 0644  NA 135 134*  --  135 139  K 2.6* 2.6*  --  3.0* 3.3*  CL 96* 99*  --  104 110  CO2  --  27  --  24 23  GLUCOSE 120* 113*  --  115* 104*  BUN 19 15  --  13 8  CREATININE 0.70 0.72  --  0.57 0.58  CALCIUM  --  8.1*  --  7.4* 7.3*  MG  --   --  1.9  --  1.8    Liver Function Tests:  Recent Labs Lab 12/30/14 1713 12/31/14 0644  AST 21 15  ALT 21 16  ALKPHOS 65 55  BILITOT 0.6 0.3  PROT 6.1* 5.4*  ALBUMIN 2.9* 2.4*    CBC:  Recent Labs Lab 12/30/14 1441 12/30/14 1713 12/31/14 0644  WBC  --  3.3* 2.5*  NEUTROABS  --  2.5  --   HGB 15.6* 12.6 10.9*  HCT 46.0 38.2 33.1*  MCV  --  80.4 80.9  PLT  --  140* 126*    Cardiac Enzymes:  Recent Labs Lab 12/30/14 1747 12/30/14 2101 12/31/14 0214 12/31/14 0644  TROPONINI 0.03 0.05* 0.03 <0.03    BNP: Invalid input(s): POCBNP   Radiology: Ct Angio Chest Pe W/cm &/or Wo Cm  12/31/2014   CLINICAL DATA:  Acute onset of elevated D-dimer.  Initial encounter.  EXAM: CT ANGIOGRAPHY CHEST WITH CONTRAST  TECHNIQUE: Multidetector CT imaging of the chest was performed using the standard protocol during bolus administration of intravenous contrast. Multiplanar CT image reconstructions and MIPs were obtained to evaluate the vascular anatomy.  CONTRAST:  OMNIPAQUE IOHEXOL 350 MG/ML SOLN  COMPARISON:  Chest radiograph performed earlier today at 7:01 p.m.  FINDINGS: There is no evidence of pulmonary embolus.  A mildly mosaic pattern of parenchymal attenuation is nonspecific. The lungs are otherwise grossly clear. There is no evidence of pleural effusion or pneumothorax. No masses are identified; no abnormal focal contrast enhancement is seen.  The mediastinum is unremarkable in appearance. No mediastinal lymphadenopathy is seen. No pericardial effusion is identified. The great vessels are grossly unremarkable in appearance. No axillary lymphadenopathy is seen. The visualized portions of the thyroid gland are unremarkable  in appearance.  The visualized portions of the liver and spleen are unremarkable.  No acute osseous abnormalities are seen.  Review of the MIP images confirms the above findings.  IMPRESSION: 1. No evidence of pulmonary embolus. 2. Nonspecific mildly mosaic pattern of parenchymal attenuation. Lungs otherwise grossly clear.   Electronically Signed   By: Roanna Raider M.D.   On: 12/31/2014 00:24   Dg Chest Port 1 View  12/30/2014   CLINICAL DATA:  Tachycardia  EXAM: PORTABLE CHEST - 1 VIEW  COMPARISON:  05/29/2008  FINDINGS: The heart size and mediastinal contours are within normal limits. Both lungs are clear. The visualized skeletal structures are unremarkable.  IMPRESSION: No active disease.   Electronically Signed   By: Elige KoHetal  Patel   On: 12/30/2014 19:24    ECG: I reviewed the current EKGs at the nursing station as they are not yet scanned in the computer. I reviewed the old EKG from 2011. Currently she has no significant change in her QRS. The heart rate on the 12-lead EKG was 140.  Telemetry:   I have personally reviewed telemetry today December 31, 2014. The rate ranges from 120-140. When her heart rate was 120 in the middle of the night after IV Cardizem, flutter waves are seen.   Impression and Recommendations    Asthma   GERD (gastroesophageal reflux disease)    Hypertension    The patient takes her clonidine irregularly. I doubt this is the basis of her current problem. Consideration should be given to using other medications for her blood pressure that are more reliable.    Hyperlipidemia    Fatigue    Etiology of presenting fatigue may be multifactorial. She does not feel her rapid heart rate. It is possible that this is playing a role. However I doubt that this is her major initiating symptom. She's had poor oral intake. I wonder if she's had a viral illness. I'm not sure yet if the patient has a urinary tract infection. These labs are in place.    Hypokalemia     Her potassium is  being repleted.    Atrial flutter   The patient has atrial flutter. For today I will start IV Cardizem and continue a drip. By tomorrow I suspect she can be switched to oral diltiazem. She has a history of asthma. I feel the beta blockade would not be the optimal drug for her. For now we will push for rate control. Two-dimensional echo is to be done at some point. Decisions can be made over time about consideration of possible cardioversion. The patient does not feel her rapid heart rate. There is no prior documented arrhythmia. We do not know how long she has been in atrial flutter. Therefore, the current recommendation would be for rate control and anticoagulation. She has a CHADS-VAS score of 3. I will leave it up to her primary team to decide whether a medication such as Xarelto or Eliquis can be started. I suggest careful discussion with the patient about anticoagulation. In some cases, patients cannot afford the new medications and choose to go on Coumadin. If Xarelto or Eliquis is started, IV heparin is not needed. After the patient has been fully anticoagulated for 3-4 weeks (depending on the medication), consideration can be given to cardioversion if she remains in atrial flutter.    Jerral BonitoJeff Ren Grasse, MD 12/31/2014, 11:00 AM

## 2015-01-01 ENCOUNTER — Inpatient Hospital Stay (HOSPITAL_COMMUNITY): Payer: Medicare Other

## 2015-01-01 DIAGNOSIS — R000 Tachycardia, unspecified: Secondary | ICD-10-CM

## 2015-01-01 DIAGNOSIS — I10 Essential (primary) hypertension: Secondary | ICD-10-CM

## 2015-01-01 LAB — CBC
HCT: 35.1 % — ABNORMAL LOW (ref 36.0–46.0)
HEMOGLOBIN: 11.5 g/dL — AB (ref 12.0–15.0)
MCH: 26.7 pg (ref 26.0–34.0)
MCHC: 32.8 g/dL (ref 30.0–36.0)
MCV: 81.6 fL (ref 78.0–100.0)
Platelets: 139 10*3/uL — ABNORMAL LOW (ref 150–400)
RBC: 4.3 MIL/uL (ref 3.87–5.11)
RDW: 15.3 % (ref 11.5–15.5)
WBC: 4.1 10*3/uL (ref 4.0–10.5)

## 2015-01-01 LAB — BASIC METABOLIC PANEL
ANION GAP: 7 (ref 5–15)
BUN: 7 mg/dL (ref 6–20)
CO2: 22 mmol/L (ref 22–32)
Calcium: 7.7 mg/dL — ABNORMAL LOW (ref 8.9–10.3)
Chloride: 110 mmol/L (ref 101–111)
Creatinine, Ser: 0.57 mg/dL (ref 0.44–1.00)
GFR calc Af Amer: >60 mL/min (ref >60)
GFR calc non Af Amer: >60 mL/min (ref >60)
GLUCOSE: 89 mg/dL (ref 65–99)
Potassium: 3.1 mmol/L — ABNORMAL LOW (ref 3.5–5.1)
Sodium: 139 mmol/L (ref 135–145)

## 2015-01-01 LAB — CLOSTRIDIUM DIFFICILE BY PCR: Toxigenic C. Difficile by PCR: NEGATIVE

## 2015-01-01 MED ORDER — SACCHAROMYCES BOULARDII 250 MG PO CAPS
250.0000 mg | ORAL_CAPSULE | Freq: Two times a day (BID) | ORAL | Status: DC
  Administered 2015-01-01 – 2015-01-04 (×7): 250 mg via ORAL
  Filled 2015-01-01 (×7): qty 1

## 2015-01-01 MED ORDER — APIXABAN 5 MG PO TABS
5.0000 mg | ORAL_TABLET | Freq: Two times a day (BID) | ORAL | Status: DC
  Administered 2015-01-01 – 2015-01-04 (×7): 5 mg via ORAL
  Filled 2015-01-01 (×7): qty 1

## 2015-01-01 MED ORDER — POTASSIUM CHLORIDE CRYS ER 10 MEQ PO TBCR
40.0000 meq | EXTENDED_RELEASE_TABLET | Freq: Three times a day (TID) | ORAL | Status: AC
  Administered 2015-01-01 (×2): 40 meq via ORAL
  Filled 2015-01-01: qty 2
  Filled 2015-01-01 (×2): qty 4

## 2015-01-01 MED ORDER — BISOPROLOL FUMARATE 5 MG PO TABS
5.0000 mg | ORAL_TABLET | Freq: Every day | ORAL | Status: DC
  Administered 2015-01-01 – 2015-01-02 (×2): 5 mg via ORAL
  Filled 2015-01-01 (×2): qty 1

## 2015-01-01 MED ORDER — POTASSIUM CHLORIDE CRYS ER 10 MEQ PO TBCR
40.0000 meq | EXTENDED_RELEASE_TABLET | Freq: Once | ORAL | Status: AC
  Administered 2015-01-01: 40 meq via ORAL
  Filled 2015-01-01 (×2): qty 2

## 2015-01-01 MED ORDER — METOPROLOL TARTRATE 1 MG/ML IV SOLN
5.0000 mg | Freq: Once | INTRAVENOUS | Status: AC
  Administered 2015-01-01: 5 mg via INTRAVENOUS

## 2015-01-01 MED ORDER — CIPROFLOXACIN HCL 500 MG PO TABS
500.0000 mg | ORAL_TABLET | Freq: Two times a day (BID) | ORAL | Status: DC
  Administered 2015-01-01 – 2015-01-04 (×7): 500 mg via ORAL
  Filled 2015-01-01 (×8): qty 1

## 2015-01-01 MED ORDER — METOPROLOL TARTRATE 1 MG/ML IV SOLN
INTRAVENOUS | Status: AC
  Filled 2015-01-01: qty 5

## 2015-01-01 MED ORDER — POTASSIUM CHLORIDE IN NACL 40-0.9 MEQ/L-% IV SOLN
INTRAVENOUS | Status: DC
  Administered 2015-01-01 – 2015-01-02 (×2): 75 mL/h via INTRAVENOUS
  Filled 2015-01-01 (×3): qty 1000

## 2015-01-01 NOTE — Progress Notes (Signed)
Utilization review completed. Allani Reber, RN, BSN. 

## 2015-01-01 NOTE — Progress Notes (Signed)
Patient ID: Penny Barnett, female   DOB: December 13, 1950, 64 y.o.   MRN: [id number]  [facility] PROGRESS NOTE  Penny Barnett WUJ:[id number] DOB: December 13, 1950 DOA: 12/30/2014 PCP: Lorretta HarpPANOSH,WANDA KOTVAN, MD   Brief narrative:    64 y.o. female with asthma, hypertension, GERD, hyperlipidemia, who was in her usual state of health until this past Friday when that afternoon she started feeling poorly. She noted at home that her heart rate was high in the 120s to 130s. She also noted that her blood pressure was running low in the 90s to 100 systolic.   Assessment/Plan:     Atrial flutter, CHADS-VAS score 3 - appreciate cardiology team following  - pt to be started on Cardizem IV, rate still in 140's  - eliquis started - [facility] with possible consideration of cardioversion if still in a- flutter    Fatigue - unclear etiology  - she has mild degree of pancytopenia but counts are better this AM  - also pt was somewhat dehydrated on admission based on Hg of 15.6 and sudden drop to 10 -11 - also ? UTI contributing - continue IVF for now  - PT evaluation requested     UTI - continue Cipro day #3 - follow up on urine culture     Hypokalemia - still low, continue to supplement and repeat BMP in AM    Hypertension - BP has been stable for now with SBP in 110's     Obesity  - Body mass index is 30.3 kg/(m^2)  DVT prophylaxis - pt on Eliquis   Code Status: Full.  Family Communication:  plan of care discussed with the patient Disposition Plan: Home when stable.   IV access:  Peripheral IV  Procedures and diagnostic studies:    Ct Angio Chest Pe W/cm &/or Wo Cm 12/31/2014  No evidence of pulmonary embolus. 2. Nonspecific mildly mosaic pattern of parenchymal attenuation  [facility]  No active disease.    Medical Consultants:  Cardiology   Other Consultants:  PT  IAnti-Infectives:   Cipro 7/18 -->  Penny Barnett,  Ruari Duggan, MD  [facility] Pager [phone]  If 7PM-7AM, please contact night-coverage [email or web address] Password [email or web address] 01/01/2015, 3:52 PM   LOS: 2 days   HPI/Subjective: No events overnight.   Objective: Filed Vitals:   01/01/15 1000 01/01/15 1100 01/01/15 1129 01/01/15 1200  BP: 124/70   107/74  Pulse: 83     Temp:   98 F (36.7 C)   TempSrc:   Oral   Resp: 27   20  Height:      Weight:      SpO2: 100% 100%      Intake/Output Summary (Last 24 hours) at 01/01/15 1552 Last data filed at 01/01/15 0900  Gross per 24 hour  Intake    422 ml  Output    350 ml  Net     72 ml    Exam:   General:  Pt is alert, follows commands appropriately, not in acute distress  Cardiovascular: Irregular rate and rhythm, no rubs, no gallops  Respiratory: Clear to auscultation bilaterally, no wheezing, no crackles, no rhonchi  Abdomen: Soft, non tender, non distended, bowel sounds present, no guarding  Extremities: No edema, pulses DP and PT palpable bilaterally  Neuro: Grossly nonfocal  Data Reviewed: Basic Metabolic Panel:  Recent Labs Lab 12/30/14 1441 12/30/14 1713 12/30/14 1949 12/30/14 2101 12/31/14 0644 01/01/15 0310  NA 135  134*  --  135 139 139  K 2.6* 2.6*  --  3.0* 3.3* 3.1*  CL 96* 99*  --  104 110 110  CO2  --  27  --  GLUCOSE 120* 113*  --  115* 104* 89  BUN 19 15  --  CREATININE 0.70 0.72  --  0.57 0.58 0.57  CALCIUM  --  8.1*  --  7.4* 7.3* 7.7*  MG  --   --  1.9  --  1.8  --    Liver Function Tests:  Recent Labs Lab 12/30/14 1713 12/31/14 0644  AST 21 15  ALT 21 16  ALKPHOS 65 55  BILITOT 0.6 0.3  PROT 6.1* 5.4*  ALBUMIN 2.9* 2.4*   CBC:  Recent Labs Lab 12/30/14 1441 12/30/14 1713 12/31/14 0644 01/01/15 0310  WBC  --  3.3* 2.5* 4.1  NEUTROABS  --  2.5  --   --   HGB 15.6* 12.6 10.9* 11.5*  HCT 46.0 38.2 33.1* 35.1*  MCV  --  80.4 80.9 81.6  PLT  --  140* 126* 139*   Cardiac Enzymes:  Recent Labs Lab 12/30/14 1747  12/30/14 2101 12/31/14 0214 12/31/14 0644  TROPONINI 0.03 0.05* 0.03 <0.03   Scheduled Meds: . apixaban  5 mg Oral BID  . bisoprolol  5 mg Oral Daily  . budesonide (PULMICORT) nebulizer solution  0.25 mg Nebulization BID  . ciprofloxacin  500 mg Oral BID  . ipratropium  0.5 mg Inhalation BID  . metoprolol      . pantoprazole  40 mg Oral Daily  . saccharomyces boulardii  250 mg Oral BID  . sodium chloride  3 mL Intravenous Q12H   Continuous Infusions: . sodium chloride 100 mL/hr at 01/01/15 0620  . diltiazem (CARDIZEM) infusion 10 mg/hr (01/01/15 1456)

## 2015-01-01 NOTE — Progress Notes (Signed)
HR 150s Card gtt 9015ml./hr, Notified Cardiology Rounding Team

## 2015-01-01 NOTE — Progress Notes (Signed)
Subjective:  HR Remains elevated. She is c/o diarrhea. On cipro for UTI. Looks like atrial flutter with variable response. On IV cardizem.  NOT ON ANTICOAGULATION.  Objective:  Vital Signs in the last 24 hours: Temp:  [98 F (36.7 C)-98.6 F (37 C)] 98 F (36.7 C) (07/19 1129) Pulse Rate:  [70-146] 83 (07/19 1000) Resp:  [17-28] 27 (07/19 1000) BP: (97-128)/(56-85) 124/70 mmHg (07/19 1000) SpO2:  [91 %-100 %] 100 % (07/19 1000) Weight:  [186 lb 3.2 oz (84.46 kg)] 186 lb 3.2 oz (84.46 kg) (07/19 0300)  Intake/Output from previous day:  Intake/Output Summary (Last 24 hours) at 01/01/15 1131 Last data filed at 01/01/15 0900  Gross per 24 hour  Intake    422 ml  Output    350 ml  Net     72 ml    Physical Exam: General appearance: alert and no distress Lungs: clear to auscultation bilaterally Heart: irregularly irregular rhythm and tachycardic Extremities: extremities normal, atraumatic, no cyanosis or edema Skin: pale, warm, dry Neurologic: Grossly normal   Rate: 110-150  Rhythm: atrial flutter  Lab Results:  Recent Labs  12/31/14 0644 01/01/15 0310  WBC 2.5* 4.1  HGB 10.9* 11.5*  PLT 126* 139*    Recent Labs  12/31/14 0644 01/01/15 0310  NA 139 139  K 3.3* 3.1*  CL 110 110  CO2 23 22  GLUCOSE 104* 89  BUN 8 7  CREATININE 0.58 0.57    Recent Labs  12/31/14 0214 12/31/14 0644  TROPONINI 0.03 <0.03   No results for input(s): INR in the last 72 hours.  Scheduled Meds: . budesonide (PULMICORT) nebulizer solution  0.25 mg Nebulization BID  . ciprofloxacin  500 mg Oral BID  . enoxaparin (LOVENOX) injection  40 mg Subcutaneous Q24H  . ipratropium  0.5 mg Inhalation BID  . metoprolol      . pantoprazole  40 mg Oral Daily  . potassium chloride  40 mEq Oral 3 times per day  . potassium chloride  40 mEq Oral Once  . sodium chloride  3 mL Intravenous Q12H   Continuous Infusions: . sodium chloride 100 mL/hr at 01/01/15 0620  . diltiazem  (CARDIZEM) infusion 15 mg/hr (01/01/15 0803)   PRN Meds:.acetaminophen **OR** acetaminophen, gi cocktail, ondansetron **OR** ondansetron (ZOFRAN) IV   Imaging: Ct Angio Chest Pe W/cm &/or Wo Cm  12/31/2014   CLINICAL DATA:  Acute onset of elevated D-dimer.  Initial encounter.  EXAM: CT ANGIOGRAPHY CHEST WITH CONTRAST  TECHNIQUE: Multidetector CT imaging of the chest was performed using the standard protocol during bolus administration of intravenous contrast. Multiplanar CT image reconstructions and MIPs were obtained to evaluate the vascular anatomy.  CONTRAST:  OMNIPAQUE IOHEXOL 350 MG/ML SOLN  COMPARISON:  Chest radiograph performed earlier today at 7:01 p.m.  FINDINGS: There is no evidence of pulmonary embolus.  A mildly mosaic pattern of parenchymal attenuation is nonspecific. The lungs are otherwise grossly clear. There is no evidence of pleural effusion or pneumothorax. No masses are identified; no abnormal focal contrast enhancement is seen.  The mediastinum is unremarkable in appearance. No mediastinal lymphadenopathy is seen. No pericardial effusion is identified. The great vessels are grossly unremarkable in appearance. No axillary lymphadenopathy is seen. The visualized portions of the thyroid gland are unremarkable in appearance.  The visualized portions of the liver and spleen are unremarkable.  No acute osseous abnormalities are seen.  Review of the MIP images confirms the above findings.  IMPRESSION: 1. No  evidence of pulmonary embolus. 2. Nonspecific mildly mosaic pattern of parenchymal attenuation. Lungs otherwise grossly clear.   Electronically Signed   By: Roanna RaiderJeffery  Chang M.D.   On: 12/31/2014 00:24   Dg Chest Port 1 View  12/30/2014   CLINICAL DATA:  Tachycardia  EXAM: PORTABLE CHEST - 1 VIEW  COMPARISON:  05/29/2008  FINDINGS: The heart size and mediastinal contours are within normal limits. Both lungs are clear. The visualized skeletal structures are unremarkable.  IMPRESSION:  No active disease.   Electronically Signed   By: Elige KoHetal  Patel   On: 12/30/2014 19:24    Assessment/Plan:  64 y.o.female with HTN and asthmatic COPD admitted 12/30/14 with atrial flutter with RVR.   Principal Problem:   Atrial flutter with RVR Active Problems:   Asthma   Hypertension   GERD (gastroesophageal reflux disease)   Hyperlipidemia   Fatigue   Hypokalemia   PLAN: Rate is still fast despite IV Diltiazem 15 mg/hr. Add low dose bisoprolol for better rate control. She is on DVT Lovenox per primary team?  CHADSVASC 3 - d/c lovenox and switch to Eliquis 5 mg BID. Will need 5 doses before considering possible TEE/Cardioversion - likely Thursday. Will try to arrange. Discussed with her in detail today.  Chrystie NoseKenneth C. Hilty, MD, [facility] System[facility]ACC Attending Cardiologist [facility]   01/01/2015, 11:31 AM

## 2015-01-01 NOTE — Progress Notes (Signed)
ANTIBIOTIC CONSULT NOTE - FOLLOW UP  Pharmacy Consult for cipro Indication: UTI  Allergies  Allergen Reactions  . Penicillins Other (See Comments)    Childhood      Patient Measurements: Height: 5\' 5"  (165.1 cm) Weight: 186 lb 3.2 oz (84.46 kg) IBW/kg (Calculated) : 57   Vital Signs: Temp: 98.3 F (36.8 C) (07/19 0800) Temp Source: Oral (07/19 0800) BP: 114/85 mmHg (07/19 0800) Pulse Rate: 115 (07/19 0800) Intake/Output from previous day: 07/18 0701 - 07/19 0700 In: 262 [P.O.:50; I.V.:212] Out: 350 [Urine:350] Intake/Output from this shift:    Labs:  Recent Labs  12/30/14 1713 12/30/14 2101 12/31/14 0644 01/01/15 0310  WBC 3.3*  --  2.5* 4.1  HGB 12.6  --  10.9* 11.5*  PLT 140*  --  126* 139*  CREATININE 0.72 0.57 0.58 0.57   Estimated Creatinine Clearance: 74.3 mL/min (by C-G formula based on Cr of 0.57). No results for input(s): VANCOTROUGH, VANCOPEAK, VANCORANDOM, GENTTROUGH, GENTPEAK, GENTRANDOM, TOBRATROUGH, TOBRAPEAK, TOBRARND, AMIKACINPEAK, AMIKACINTROU, AMIKACIN in the last 72 hours.   Microbiology: No results found for this or any previous visit (from the past 720 hour(s)).  Anti-infectives    Start     Dose/Rate Route Frequency Ordered Stop   12/31/14 0000  ciprofloxacin (CIPRO) IVPB 400 mg     400 mg 200 mL/hr over 60 Minutes Intravenous Every 12 hours 12/30/14 2352        Assessment: 64 yo female here with weakness and fatigue and on cipro day 2 for possible UTI. WBC= 4.1, afebrile, SCr= 0.57 and CrCl ~ 75. She is also noted with aflutter and is considering anticoagulation.   7/18 cipro>> 7/17 urine  Plan:  -Will change cipro to po as she is tolerating po medications -Will follow patient progress  Harland Germanndrew Laporchia Nakajima, Pharm D 01/01/2015 8:42 AM

## 2015-01-01 NOTE — Progress Notes (Signed)
*  PRELIMINARY RESULTS* Echocardiogram 2D Echocardiogram has been performed.  Penny Barnett 01/01/2015, 2:37 PM 

## 2015-01-01 NOTE — Evaluation (Signed)
Physical Therapy Evaluation Patient Details Name: Penny FannyDeidre A Demers MRN: [id number] DOB: 05-09-1951 Today's Date: 01/01/2015   History of Present Illness  64 [facility]. [facility] with A flutter and high heart rate.  IV cardizem was started to help control the rate as well as betablockers.  Cardiology workup pending and pt may be a candidate for cardioversion later in the week.  Pt with significant PMHx of HTN, and R shoulder surgery.   Clinical Impression  Pt has significant activity intolerance vs deconditioning compared to her baseline.  She had difficulty walking 150' in a controlled environment without fatigue.  HR and O2 sats remained stable throughout gait.  PT will follow acutely, but I anticipate she will not need PT f/u at discharge.  HEP exercises provided and I encouraged her to do them TID here in the hospital.     Follow Up Recommendations No PT follow up    Equipment Recommendations  None recommended by PT    Recommendations for Other Services   NA    Precautions / Restrictions Precautions Precautions: Other (comment) Precaution Comments: monitor HR with activity      Mobility  Bed Mobility Overal bed mobility: Independent                Transfers Overall transfer level: Independent Equipment used: None                Ambulation/Gait Ambulation/Gait assistance: Min guard Ambulation Distance (Feet): 150 Feet Assistive device: None Gait Pattern/deviations: Step-through pattern;Staggering left;Staggering right Gait velocity: decreased Gait velocity interpretation: Below normal speed for age/gender General Gait Details: pt with mildly staggering gait pattern and signs of significant deconditioning vs activity intolerance.  She reports feeling generally weak and not able to do much physical activity, but that today is better than she felt yesterday when she could hardly make it to the bathroom and back.  HR remained in the low 100s  during gait.          Balance Overall balance assessment: Needs assistance Sitting-balance support: Feet supported;No upper extremity supported Sitting balance-Leahy Scale: Good     Standing balance support: Single extremity supported Standing balance-Leahy Scale: Good                               Pertinent Vitals/Pain Pain Assessment: No/denies pain    Home Living Family/patient expects to be discharged to:: Private residence Living Arrangements: Alone Available Help at Discharge: Family;Available PRN/intermittently Type of Home: House Home Access: Stairs to enter     Home Layout: One level Home Equipment: None      Prior Function Level of Independence: Independent         Comments: pt works two part time jobs and drives. Completely independent PTA.         Extremity/Trunk Assessment   Upper Extremity Assessment: Overall WFL for tasks assessed           Lower Extremity Assessment: Generalized weakness      Cervical / Trunk Assessment: Normal  Communication   Communication: No difficulties  Cognition Arousal/Alertness: Awake/alert Behavior During Therapy: WFL for tasks assessed/performed Overall Cognitive Status: Within Functional Limits for tasks assessed                         Exercises General Exercises - Lower Extremity Long Arc Quad: AROM;Both;10 reps;Seated Hip ABduction/ADduction: AROM;Both;10 reps;Seated Hip Flexion/Marching: AROM;Both;10  reps;Seated Toe Raises: AROM;Both;10 reps;Seated Heel Raises: AROM;Both;10 reps;Seated      Assessment/Plan    PT Assessment Patient needs continued PT services  PT Diagnosis Difficulty walking;Abnormality of gait;Generalized weakness   PT Problem List Decreased strength;Decreased activity tolerance;Decreased balance;Decreased mobility;Cardiopulmonary status limiting activity  PT Treatment Interventions Gait training;Stair training;DME instruction;Functional mobility  training;Therapeutic exercise;Therapeutic activities;Balance training;Neuromuscular re-education;Patient/family education   PT Goals (Current goals can be found in the Care Plan section) Acute Rehab PT Goals Patient Stated Goal: to return to PLOF PT Goal Formulation: With patient Time For Goal Achievement: 01/15/15 Potential to Achieve Goals: Good    Frequency Min 3X/week           End of Session   Activity Tolerance: Patient limited by fatigue Patient left: in chair;with call bell/phone within reach;with family/visitor present Nurse Communication: Mobility status         Time: 1610-9604 PT Time Calculation (min) (ACUTE ONLY): 16 min   Charges:   PT Evaluation $Initial PT Evaluation Tier I: 1 Procedure          Magdalina Whitehead B. Gautam Langhorst, PT, DPT [phone]   01/01/2015, 5:15 PM

## 2015-01-01 NOTE — Care Management Important Message (Signed)
Important Message  Patient Details  Name: Penny Barnett MRN: [id number] Date of Birth: 1951/05/24   Medicare Important Message Given:  Yes-second notification given    Yvonna Alanisndra M Robinson 01/01/2015, 9:56 AM

## 2015-01-02 LAB — BASIC METABOLIC PANEL
Anion gap: 10 (ref 5–15)
BUN: 8 mg/dL (ref 6–20)
CHLORIDE: 109 mmol/L (ref 101–111)
CO2: 19 mmol/L — ABNORMAL LOW (ref 22–32)
Calcium: 7.6 mg/dL — ABNORMAL LOW (ref 8.9–10.3)
Creatinine, Ser: 0.56 mg/dL (ref 0.44–1.00)
GFR calc Af Amer: >60 mL/min (ref >60)
GFR calc non Af Amer: >60 mL/min (ref >60)
Glucose, Bld: 98 mg/dL (ref 65–99)
Potassium: 4.2 mmol/L (ref 3.5–5.1)
Sodium: 138 mmol/L (ref 135–145)

## 2015-01-02 LAB — URINE CULTURE: Culture: >=100000

## 2015-01-02 LAB — CBC
HCT: 32.4 % — ABNORMAL LOW (ref 36.0–46.0)
Hemoglobin: 10.6 g/dL — ABNORMAL LOW (ref 12.0–15.0)
MCH: 26.6 pg (ref 26.0–34.0)
MCHC: 32.7 g/dL (ref 30.0–36.0)
MCV: 81.2 fL (ref 78.0–100.0)
Platelets: 141 10*3/uL — ABNORMAL LOW (ref 150–400)
RBC: 3.99 MIL/uL (ref 3.87–5.11)
RDW: 15.6 % — AB (ref 11.5–15.5)
WBC: 4.4 10*3/uL (ref 4.0–10.5)

## 2015-01-02 MED ORDER — OFF THE BEAT BOOK
Freq: Once | Status: AC
  Administered 2015-01-02: 21:00:00
  Filled 2015-01-02: qty 1

## 2015-01-02 MED ORDER — SODIUM CHLORIDE 0.9 % IV SOLN
INTRAVENOUS | Status: DC
  Administered 2015-01-02: 20:00:00 via INTRAVENOUS

## 2015-01-02 MED ORDER — BISOPROLOL FUMARATE 5 MG PO TABS
5.0000 mg | ORAL_TABLET | Freq: Two times a day (BID) | ORAL | Status: DC
  Administered 2015-01-02 – 2015-01-04 (×4): 5 mg via ORAL
  Filled 2015-01-02 (×4): qty 1

## 2015-01-02 NOTE — Progress Notes (Signed)
    Subjective:  HR Remains elevated. On cipro for UTI. Looks like atrial flutter with variable response. On IV cardizem.    Objective:  Vital Signs in the last 24 hours: Temp:  [98 F (36.7 C)-99.3 F (37.4 C)] 98.8 F (37.1 C) (07/20 0500) Pulse Rate:  [80-121] 121 (07/20 0500) Resp:  [18-27] 23 (07/20 0500) BP: (96-124)/(60-85) 105/67 mmHg (07/20 0500) SpO2:  [90 %-100 %] 94 % (07/20 0736) Weight:  [203 lb 14.4 oz (92.488 kg)] 203 lb 14.4 oz (92.488 kg) (07/20 0500)  Intake/Output from previous day:  Intake/Output Summary (Last 24 hours) at 01/02/15 0757 Last data filed at 01/02/15 0700  Gross per 24 hour  Intake 4920.33 ml  Output    500 ml  Net 4420.33 ml    Physical Exam: General appearance: alert and no distress Lungs: clear to auscultation bilaterally Heart: irregularly irregular rhythm and tachycardic Extremities: extremities normal, atraumatic, no cyanosis or edema Skin: pale, warm, dry Neurologic: Grossly normal   Rate: 110-120  Rhythm: atrial flutter  Lab Results:  Recent Labs  01/01/15 0310 01/02/15 0246  WBC 4.1 4.4  HGB 11.5* 10.6*  PLT 139* 141*    Recent Labs  01/01/15 0310 01/02/15 0246  NA 139 138  K 3.1* 4.2  CL 110 109  CO2 22 19*  GLUCOSE 89 98  BUN 7 8  CREATININE 0.57 0.56    Recent Labs  12/31/14 0214 12/31/14 0644  TROPONINI 0.03 <0.03   No results for input(s): INR in the last 72 hours.  Scheduled Meds: . apixaban  5 mg Oral BID  . bisoprolol  5 mg Oral Daily  . budesonide (PULMICORT) nebulizer solution  0.25 mg Nebulization BID  . ciprofloxacin  500 mg Oral BID  . ipratropium  0.5 mg Inhalation BID  . pantoprazole  40 mg Oral Daily  . saccharomyces boulardii  250 mg Oral BID  . sodium chloride  3 mL Intravenous Q12H   Continuous Infusions: . 0.9 % NaCl with KCl 40 mEq / L 75 mL/hr (01/02/15 0540)  . diltiazem (CARDIZEM) infusion 10 mg/hr (01/02/15 0123)   PRN Meds:.acetaminophen **OR** acetaminophen, gi  cocktail, ondansetron **OR** ondansetron (ZOFRAN) IV   Imaging: CXR 11/30/14- NAD  Assessment/Plan:  64 y.o.female with HTN and asthmatic COPD admitted 12/30/14 with atrial flutter with RVR.   Principal Problem:   Atrial flutter with RVR Active Problems:   Asthma   Hypertension   GERD (gastroesophageal reflux disease)   Hyperlipidemia   Fatigue   Hypokalemia   PLAN:   CHADSVASC 3 -  Eliquis 5 mg BID added. Will need 5 doses before considering possible TEE/Cardioversion - likely Thursday.   Corine ShelterLUKE Micaella Gitto PA-C 01/02/2015 8:01 AM

## 2015-01-02 NOTE — Progress Notes (Addendum)
Patient ID: Penny Barnett, female   DOB: 1951/03/06, 64 y.o.   MRN: [id number]  [facility] PROGRESS NOTE  Penny Barnett WUJ:[id number] DOB: 1951/03/06 DOA: 12/30/2014 PCP: Lorretta HarpPANOSH,WANDA KOTVAN, MD   Brief narrative:    64 y.o. female with asthma, hypertension, GERD, hyperlipidemia, who was in her usual state of health until this past Friday when that afternoon she started feeling poorly. She noted at home that her heart rate was high in the 120s to 130s. She also noted that her blood pressure was running low in the 90s to 100 systolic.   Assessment/Plan:     Atrial flutter, CHADS-VAS score 3 - appreciate cardiology team following  - started on Cardizem IV, rate still in 120's  - eliquis started 01/01/2015 - plan for cardioversion on Thursday per cardiology team     Fatigue - she had mild degree of pancytopenia on admission but counts are better this AM  - also pt was somewhat dehydrated on admission based on Hg of 15.6 and sudden drop to 10 -11 - also ? UTI contributing - PT evaluation requested     UTI - continue Cipro day #4/7 - urine culture positive for E. Coli and sensitive to Cipro    Diarrhea - stool studies requested, stool culture, O&P, GI panel, lactoferrin - GI consulted     Hypokalemia - supplemented and WNL this AM    Hypertension - BP has been stable for now with SBP in 120's     Obesity  - Body mass index is 30.3 kg/(m^2)  DVT prophylaxis - pt on Eliquis   Code Status: Full.  Family Communication:  plan of care discussed with the patient Disposition Plan: Home when stable.   IV access:  Peripheral IV  Procedures and diagnostic studies:    Ct Angio Chest Pe W/cm &/or Wo Cm 12/31/2014  No evidence of pulmonary embolus. 2. Nonspecific mildly mosaic pattern of parenchymal attenuation  [facility]  No active disease.    Medical Consultants:  Cardiology   Other Consultants:  PT  IAnti-Infectives:   Cipro 7/18  -->  Debbora PrestoMAGICK-Georgena Weisheit, MD  [facility] Pager [phone]  If 7PM-7AM, please contact night-coverage [email or web address] Password [email or web address] 01/02/2015, 10:41 AM   LOS: 3 days   HPI/Subjective: No events overnight.   Objective: Filed Vitals:   01/02/15 0200 01/02/15 0500 01/02/15 0736 01/02/15 0829  BP:  105/67  123/71  Pulse:  121    Temp:  98.8 F (37.1 C)    TempSrc:  Oral    Resp: 18 23  30   Height:      Weight:  92.488 kg (203 lb 14.4 oz)    SpO2:  94% 94%     Intake/Output Summary (Last 24 hours) at 01/02/15 1041 Last data filed at 01/02/15 0800  Gross per 24 hour  Intake 5085.33 ml  Output    500 ml  Net 4585.33 ml    Exam:   General:  Pt is alert, follows commands appropriately, not in acute distress  Cardiovascular: Irregular rate and rhythm, no rubs, no gallops  Respiratory: Clear to auscultation bilaterally, no wheezing, no crackles, no rhonchi  Abdomen: Soft, non tender, non distended, bowel sounds present, no guarding  Extremities: No edema, pulses DP and PT palpable bilaterally  Neuro: Grossly nonfocal  Data Reviewed: Basic Metabolic Panel:  Recent Labs Lab 12/30/14 1713 12/30/14 1949 12/30/14 2101 12/31/14 0644 01/01/15 0310 01/02/15 0246  NA 134*  --  135 139 139  138  K 2.6*  --  3.0* 3.3* 3.1* 4.2  CL 99*  --  104 110 110 109  CO2 27  --  24 23 22 19*  GLUCOSE 113*  --  115* 104* 89 98  BUN 15  --  13 8 7 8  CREATININE 0.72  --  0.57 0.58 0.57 0.56  CALCIUM 8.1*  --  7.4* 7.3* 7.7* 7.6*  MG  --  1.9  --  1.8  --   --    Liver Function Tests:  Recent Labs Lab 12/30/14 1713 12/31/14 0644  AST 21 15  ALT 21 16  ALKPHOS 65 55  BILITOT 0.6 0.3  PROT 6.1* 5.4*  ALBUMIN 2.9* 2.4*   CBC:  Recent Labs Lab 12/30/14 1441 12/30/14 1713 12/31/14 0644 01/01/15 0310 01/02/15 0246  WBC  --  3.3* 2.5* 4.1 4.4  NEUTROABS  --  2.5  --   --   --   HGB 15.6* 12.6 10.9* 11.5* 10.6*  HCT 46.0 38.2 33.1* 35.1* 32.4*  MCV  --  80.4 80.9 81.6 81.2   PLT  --  140* 126* 139* 141*   Cardiac Enzymes:  Recent Labs Lab 12/30/14 1747 12/30/14 2101 12/31/14 0214 12/31/14 0644  TROPONINI 0.03 0.05* 0.03 <0.03   Scheduled Meds: . apixaban  5 mg Oral BID  . bisoprolol  5 mg Oral Daily  . budesonide (PULMICORT) nebulizer solution  0.25 mg Nebulization BID  . ciprofloxacin  500 mg Oral BID  . ipratropium  0.5 mg Inhalation BID  . pantoprazole  40 mg Oral Daily  . saccharomyces boulardii  250 mg Oral BID  . sodium chloride  3 mL Intravenous Q12H   Continuous Infusions: . 0.9 % NaCl with KCl 40 mEq / L 75 mL/hr (01/02/15 0540)  . diltiazem (CARDIZEM) infusion 10 mg/hr (01/02/15 0123)          

## 2015-01-03 ENCOUNTER — Inpatient Hospital Stay (HOSPITAL_COMMUNITY): Payer: Medicare Other | Admitting: Certified Registered Nurse Anesthetist

## 2015-01-03 ENCOUNTER — Encounter (HOSPITAL_COMMUNITY)
Admission: EM | Disposition: A | Discharge status: Home / Self Care | Payer: Self-pay | Source: Home / Self Care | Attending: Internal Medicine

## 2015-01-03 ENCOUNTER — Encounter (HOSPITAL_COMMUNITY): Payer: Self-pay | Admitting: *Deleted

## 2015-01-03 ENCOUNTER — Ambulatory Visit (HOSPITAL_COMMUNITY): Payer: Medicare Other

## 2015-01-03 DIAGNOSIS — I351 Nonrheumatic aortic (valve) insufficiency: Secondary | ICD-10-CM

## 2015-01-03 DIAGNOSIS — J4540 Moderate persistent asthma, uncomplicated: Secondary | ICD-10-CM

## 2015-01-03 DIAGNOSIS — I4819 Other persistent atrial fibrillation: Secondary | ICD-10-CM | POA: Diagnosis present

## 2015-01-03 HISTORY — PX: CARDIOVERSION: SHX1299

## 2015-01-03 HISTORY — PX: TEE WITHOUT CARDIOVERSION: SHX5443

## 2015-01-03 LAB — FECAL LACTOFERRIN, QUANT: Fecal Lactoferrin: POSITIVE

## 2015-01-03 LAB — CBC
HEMATOCRIT: 32.1 % — AB (ref 36.0–46.0)
Hemoglobin: 10.4 g/dL — ABNORMAL LOW (ref 12.0–15.0)
MCH: 26.3 pg (ref 26.0–34.0)
MCHC: 32.4 g/dL (ref 30.0–36.0)
MCV: 81.3 fL (ref 78.0–100.0)
Platelets: 168 10*3/uL (ref 150–400)
RBC: 3.95 MIL/uL (ref 3.87–5.11)
RDW: 15.9 % — ABNORMAL HIGH (ref 11.5–15.5)
WBC: 5.1 10*3/uL (ref 4.0–10.5)

## 2015-01-03 LAB — BASIC METABOLIC PANEL
Anion gap: 5 (ref 5–15)
BUN: <5 mg/dL — ABNORMAL LOW (ref 6–20)
CO2: 25 mmol/L (ref 22–32)
CREATININE: 0.46 mg/dL (ref 0.44–1.00)
Calcium: 8.3 mg/dL — ABNORMAL LOW (ref 8.9–10.3)
Chloride: 111 mmol/L (ref 101–111)
GFR calc Af Amer: >60 mL/min (ref >60)
GFR calc non Af Amer: >60 mL/min (ref >60)
Glucose, Bld: 101 mg/dL — ABNORMAL HIGH (ref 65–99)
Potassium: 3.7 mmol/L (ref 3.5–5.1)
Sodium: 141 mmol/L (ref 135–145)

## 2015-01-03 LAB — OVA AND PARASITE EXAMINATION

## 2015-01-03 SURGERY — ECHOCARDIOGRAM, TRANSESOPHAGEAL
Anesthesia: Monitor Anesthesia Care

## 2015-01-03 MED ORDER — LIDOCAINE VISCOUS 2 % MT SOLN
OROMUCOSAL | Status: AC
  Filled 2015-01-03: qty 15

## 2015-01-03 MED ORDER — FENTANYL CITRATE (PF) 100 MCG/2ML IJ SOLN
INTRAMUSCULAR | Status: AC
  Filled 2015-01-03: qty 2

## 2015-01-03 MED ORDER — MIDAZOLAM HCL 5 MG/ML IJ SOLN
INTRAMUSCULAR | Status: AC
  Filled 2015-01-03: qty 2

## 2015-01-03 MED ORDER — PROPOFOL INFUSION 10 MG/ML OPTIME
INTRAVENOUS | Status: DC | PRN
  Administered 2015-01-03: 150 ug/kg/min via INTRAVENOUS

## 2015-01-03 MED ORDER — POTASSIUM CHLORIDE CRYS ER 20 MEQ PO TBCR: 20.0000 meq | EXTENDED_RELEASE_TABLET | Freq: Once | ORAL | Status: DC

## 2015-01-03 MED ORDER — LIDOCAINE VISCOUS 2 % MT SOLN
OROMUCOSAL | Status: DC | PRN
  Administered 2015-01-03: 1 via OROMUCOSAL

## 2015-01-03 MED ORDER — PHENYLEPHRINE HCL 10 MG/ML IJ SOLN
INTRAMUSCULAR | Status: DC | PRN
  Administered 2015-01-03 (×2): 80 ug via INTRAVENOUS

## 2015-01-03 MED ORDER — LACTATED RINGERS IV SOLN
INTRAVENOUS | Status: DC | PRN
  Administered 2015-01-03: 10:00:00 via INTRAVENOUS

## 2015-01-03 MED ORDER — POTASSIUM CHLORIDE 10 MEQ/100ML IV SOLN
10.0000 meq | INTRAVENOUS | Status: AC
  Administered 2015-01-03 (×2): 10 meq via INTRAVENOUS
  Filled 2015-01-03 (×2): qty 100

## 2015-01-03 NOTE — Op Note (Signed)
Pt anesthetized with propofol by anesthesia.   With pads in AP position patient cardioverted to SR with 200 J synchronized biphasic energy Procedure without complication.

## 2015-01-03 NOTE — Interval H&P Note (Signed)
History and Physical Interval Note:  01/03/2015 10:04 AM  Penny Barnett  has presented today for surgery, with the diagnosis of ATRIAL FLUTTER  The various methods of treatment have been discussed with the patient and family. After consideration of risks, benefits and other options for treatment, the patient has consented to  Procedure(s): TRANSESOPHAGEAL ECHOCARDIOGRAM (TEE) (N/A) CARDIOVERSION (N/A) as a surgical intervention .  The patient's history has been reviewed, patient examined, no change in status, stable for surgery.  I have reviewed the patient's chart and labs.  Questions were answered to the patient's satisfaction.     Dietrich Pates

## 2015-01-03 NOTE — Transfer of Care (Signed)
Immediate Anesthesia Transfer of Care Note  Patient: Penny Barnett  Procedure(s) Performed: Procedure(s): TRANSESOPHAGEAL ECHOCARDIOGRAM (TEE) (N/A) CARDIOVERSION (N/A)  Patient Location: PACU  Anesthesia Type:MAC  Level of Consciousness: awake, alert  and oriented  Airway & Oxygen Therapy: Patient Spontanous Breathing and Patient connected to nasal cannula oxygen  Post-op Assessment: Report given to RN and Post -op Vital signs reviewed and stable  Post vital signs: Reviewed and stable  Last Vitals:  Filed Vitals:   01/03/15 1040  BP: 86/59  Pulse:   Temp:   Resp: 19    Complications: No apparent anesthesia complications

## 2015-01-03 NOTE — Progress Notes (Signed)
Physical Therapy Treatment Patient Details Name: Penny Barnett MRN: [id number] DOB: Aug 19, 1950 Today's Date: 01/03/2015    History of Present Illness 64 y.o. female admitted to [facility] on 12/30/14 with A flutter and high heart rate.  IV cardizem was started to help control the rate as well as betablockers.  Cardiology workup pending and pt may be a candidate for cardioversion later in the week.  Pt with significant PMHx of HTN, and R shoulder surgery.     PT Comments    Pt progressing towards physical therapy goals. Tolerance for functional activity remains low, and pt is unable to ambulate farther than 150 feet at a time due to DOE. HR remained in low 90's throughout gait training. Discussed benefits of cardiac rehab at d/c for further cardiovascular training. Will continue to follow.   Follow Up Recommendations  No PT follow up (Cardiac rehab )     Equipment Recommendations  None recommended by PT    Recommendations for Other Services       Precautions / Restrictions Precautions Precautions: Other (comment) Precaution Comments: monitor HR with activity Restrictions Weight Bearing Restrictions: No    Mobility  Bed Mobility Overal bed mobility: Independent                Transfers Overall transfer level: Independent Equipment used: None                Ambulation/Gait Ambulation/Gait assistance: Supervision Ambulation Distance (Feet): 150 Feet Assistive device: None Gait Pattern/deviations: Step-through pattern;Decreased stride length;Trunk flexed Gait velocity: decreased   General Gait Details: Pt dyspneic at end of gait training, and was no longer able to hold a conversation due to SOB. Recovered fairly quickly (within 1 minute) with seated rest break. HR during gait training increased to about 94bpm at the highest.    Stairs            Wheelchair Mobility    Modified Rankin (Stroke Patients Only)       Balance Overall balance assessment:  Needs assistance Sitting-balance support: Feet supported;No upper extremity supported Sitting balance-Leahy Scale: Normal     Standing balance support: No upper extremity supported Standing balance-Leahy Scale: Good                      Cognition Arousal/Alertness: Awake/alert Behavior During Therapy: WFL for tasks assessed/performed Overall Cognitive Status: Within Functional Limits for tasks assessed                      Exercises General Exercises - Lower Extremity Long Arc Quad: AROM;Both;10 reps;Seated Hip ABduction/ADduction: AROM;Both;10 reps;Seated Toe Raises: AROM;Both;10 reps;Seated Heel Raises: AROM;Both;10 reps;Seated    General Comments        Pertinent Vitals/Pain Pain Assessment: No/denies pain    Home Living                      Prior Function            PT Goals (current goals can now be found in the care plan section) Acute Rehab PT Goals Patient Stated Goal: to return to PLOF PT Goal Formulation: With patient Time For Goal Achievement: 01/15/15 Potential to Achieve Goals: Good Progress towards PT goals: Progressing toward goals    Frequency  Min 3X/week    PT Plan Current plan remains appropriate    Co-evaluation             End of Session Equipment Utilized During Treatment:  Gait belt Activity Tolerance: Patient limited by fatigue Patient left: in chair;with call bell/phone within reach;with family/visitor present     Time: 1610-9604 PT Time Calculation (min) (ACUTE ONLY): 20 min  Charges:  $Gait Training: 8-22 mins                    G Codes:      Conni Slipper Jan 22, 2015, 3:16 PM   Conni Slipper, PT, DPT Acute Rehabilitation Services Pager: [phone]

## 2015-01-03 NOTE — Anesthesia Postprocedure Evaluation (Signed)
Anesthesia Post Note  Patient: Penny Barnett  Procedure(s) Performed: Procedure(s) (LRB): TRANSESOPHAGEAL ECHOCARDIOGRAM (TEE) (N/A) CARDIOVERSION (N/A)  Anesthesia type: general  Patient location: PACU  Post pain: Pain level controlled  Post assessment: Patient's Cardiovascular Status Stable  Last Vitals:  Filed Vitals:   01/03/15 1120  BP: 108/71  Pulse: 70  Temp:   Resp: 23    Post vital signs: Reviewed and stable  Level of consciousness: sedated  Complications: No apparent anesthesia complications

## 2015-01-03 NOTE — Progress Notes (Signed)
Pt returned to room, alert and oriented.  NSR, no complaints at this time. Will continue to monitor pt. George Hugh RN 01/03/15 1154.

## 2015-01-03 NOTE — Progress Notes (Signed)
Patient ID: Penny Barnett, female   DOB: May 30, 1951, 64 y.o.   MRN: [id number]  [facility] PROGRESS NOTE  Penny Barnett WUJ:[id number] DOB: 07-16-1950 DOA: 12/30/2014 PCP: Lorretta Harp, MD   Brief narrative:    64 y.o. female with asthma, hypertension, GERD, hyperlipidemia, who was in her usual state of health until this past Friday when that afternoon she started feeling poorly. She noted at home that her heart rate was high in the 120s to 130s. She also noted that her blood pressure was running low in the 90s to 100 systolic.   Assessment/Plan:     Atrial flutter, CHADS-VAS score 3 - appreciate cardiology team following  - s/p cardioversion today 7/21 - eliquis started 01/01/2015 - follow up on cardiology recommendations     Fatigue - she had mild degree of pancytopenia on admission but counts are better this AM  - also pt was somewhat dehydrated on admission based on Hg of 15.6 and sudden drop to 10 -11 - also ? UTI contributing - PT evaluation requested     UTI - continue Cipro day #5/7 - urine culture positive for E. Coli and sensitive to Cipro    Diarrhea - stool studies requested, stool culture, GI panel pending - lactoferrin positive in stool, will discuss with GI team     Hypokalemia - supplemented and WNL this AM    Hypertension - BP has been stable for now with SBP in 120's     Obesity  - Body mass index is 30.3 kg/(m^2)  DVT prophylaxis - pt on Eliquis   Code Status: Full.  Family Communication:  plan of care discussed with the patient Disposition Plan: Home when stable.   IV access:  Peripheral IV  Procedures and diagnostic studies:    Ct Angio Chest Pe W/cm &/or Wo Cm 12/31/2014  No evidence of pulmonary embolus. 2. Nonspecific mildly mosaic pattern of parenchymal attenuation  Dg Chest [facility] 12/30/2014  No active disease.    Medical Consultants:  Cardiology   Other Consultants:  PT  IAnti-Infectives:   Cipro 7/18  -->  Debbora Presto, MD  [facility] Pager [phone]  If 7PM-7AM, please contact night-coverage [email or web address] Password [facility] 01/03/2015, 6:49 PM   LOS: 4 days   HPI/Subjective: No events overnight.   Objective: Filed Vitals:   01/03/15 1040 01/03/15 1102 01/03/15 1110 01/03/15 1120  BP: 86/59 90/52 102/68 108/71  Pulse:  66 69 70  Temp:  97.7 F (36.5 C)    TempSrc:  Oral    Resp: 19 19 24 23   Height:      Weight:      SpO2: 86% 92% 95% 94%    Intake/Output Summary (Last 24 hours) at 01/03/15 1849 Last data filed at 01/03/15 0852  Gross per 24 hour  Intake     43 ml  Output    450 ml  Net   -407 ml    Exam:   General:  Pt is alert, follows commands appropriately, not in acute distress  Cardiovascular: Irregular rate and rhythm, no rubs, no gallops  Respiratory: Clear to auscultation bilaterally, no wheezing, no crackles, no rhonchi  Abdomen: Soft, non tender, non distended, bowel sounds present, no guarding  Extremities: No edema, pulses DP and PT palpable bilaterally  Neuro: Grossly nonfocal  Data Reviewed: Basic Metabolic Panel:  Recent Labs Lab 12/30/14 1949 12/30/14 2101 12/31/14 0644 01/01/15 0310 01/02/15 0246 01/03/15 0340  NA  --  135 139 139 138 141  K  --  3.0* 3.3* 3.1* 4.2 3.7  CL  --  104 110 110 109 111  CO2  --  19* 25  GLUCOSE  --  115* 104* 89 98 101*  BUN  --  <5*  CREATININE  --  0.57 0.58 0.57 0.56 0.46  CALCIUM  --  7.4* 7.3* 7.7* 7.6* 8.3*  MG 1.9  --  1.8  --   --   --    Liver Function Tests:  Recent Labs Lab 12/30/14 1713 12/31/14 0644  AST 21 15  ALT 21 16  ALKPHOS 65 55  BILITOT 0.6 0.3  PROT 6.1* 5.4*  ALBUMIN 2.9* 2.4*   CBC:  Recent Labs Lab 12/30/14 1713 12/31/14 0644 01/01/15 0310 01/02/15 0246 01/03/15 0340  WBC 3.3* 2.5* 4.1 4.4 5.1  NEUTROABS 2.5  --   --   --   --   HGB 12.6 10.9* 11.5* 10.6* 10.4*  HCT 38.2 33.1* 35.1* 32.4* 32.1*  MCV 80.4 80.9 81.6 81.2 81.3  PLT  140* 126* 139* 141* 168   Cardiac Enzymes:  Recent Labs Lab 12/30/14 1747 12/30/14 2101 12/31/14 0214 12/31/14 0644  TROPONINI 0.03 0.05* 0.03 <0.03   Scheduled Meds: . apixaban  5 mg Oral BID  . bisoprolol  5 mg Oral BID  . budesonide (PULMICORT) nebulizer solution  0.25 mg Nebulization BID  . ciprofloxacin  500 mg Oral BID  . ipratropium  0.5 mg Inhalation BID  . pantoprazole  40 mg Oral Daily  . saccharomyces boulardii  250 mg Oral BID  . sodium chloride  3 mL Intravenous Q12H   Continuous Infusions:

## 2015-01-03 NOTE — Op Note (Signed)
LA, LA appendage without masses.    Full report to follow in CV section of chart   

## 2015-01-03 NOTE — Progress Notes (Signed)
ANTIBIOTIC CONSULT NOTE - FOLLOW UP  Pharmacy Consult for cipro Indication: UTI  Allergies  Allergen Reactions  . Penicillins Other (See Comments)    Childhood      Patient Measurements: Height:  (165.1 cm) Weight: 203 lb 14.4 oz (92.488 kg) IBW/kg (Calculated) : 57   Vital Signs: Temp: 97.9 F (36.6 C) (07/21 0603) Temp Source: Oral (07/21 0603) BP: 114/77 mmHg (07/21 0603) Pulse Rate: 103 (07/21 0603) Intake/Output from previous day: 07/20 0701 - 07/21 0700 In: 927.4 [P.O.:720; I.V.:207.4] Out: 450 [Urine:450] Intake/Output from this shift: Total I/O In: 3 [I.V.:3] Out: -   Labs:  Recent Labs  01/01/15 0310 01/02/15 0246 01/03/15 0340  WBC 4.1 4.4 5.1  HGB 11.5* 10.6* 10.4*  PLT 139* 141* 168  CREATININE 0.57 0.56 0.46   Estimated Creatinine Clearance: 77.8 mL/min (by C-G formula based on Cr of 0.46). No results for input(s): VANCOTROUGH, VANCOPEAK, VANCORANDOM, GENTTROUGH, GENTPEAK, GENTRANDOM, TOBRATROUGH, TOBRAPEAK, TOBRARND, AMIKACINPEAK, AMIKACINTROU, AMIKACIN in the last 72 hours.   Microbiology: Recent Results (from the past 720 hour(s))  Urine culture     Status: None   Collection Time: 12/30/14  7:00 PM  Result Value Ref Range Status   Specimen Description URINE, RANDOM  Final   Special Requests NONE  Final   Culture >=100,000 COLONIES/mL ESCHERICHIA COLI  Final   Report Status 01/02/2015 FINAL  Final   Organism ID, Bacteria ESCHERICHIA COLI  Final      Susceptibility   Escherichia coli - MIC*    AMPICILLIN 8 SENSITIVE Sensitive     CEFAZOLIN <=4 SENSITIVE Sensitive     CEFTRIAXONE <=1 SENSITIVE Sensitive     CIPROFLOXACIN <=0.25 SENSITIVE Sensitive     GENTAMICIN <=1 SENSITIVE Sensitive     IMIPENEM <=0.25 SENSITIVE Sensitive     NITROFURANTOIN 32 SENSITIVE Sensitive     TRIMETH/SULFA <=20 SENSITIVE Sensitive     AMPICILLIN/SULBACTAM 4 SENSITIVE Sensitive     PIP/TAZO <=4 SENSITIVE Sensitive     * >=100,000 COLONIES/mL ESCHERICHIA  COLI  Clostridium Difficile by PCR (not at [facility])     Status: None   Collection Time: 01/01/15 10:00 AM  Result Value Ref Range Status   C difficile by pcr NEGATIVE NEGATIVE Final    Anti-infectives    Start     Dose/Rate Route Frequency Ordered Stop   01/01/15 1000  ciprofloxacin (CIPRO) tablet 500 mg     500 mg Oral 2 times daily 01/01/15 0846     12/31/14 0000  ciprofloxacin (CIPRO) IVPB 400 mg  Status:  Discontinued     400 mg 200 mL/hr over 60 Minutes Intravenous Every 12 hours 12/30/14 2352 01/01/15 0846      Assessment: 64 yo female here with weakness and fatigue and on cipro day 5 for E. Coli UTI (sens to cipro). WBC= 4.1, afebrile, SCr= 0.57 and CrCl ~ 75. Noted plans for total of 7 days antibiotic.  7/18 cipro>> 7/17 urine- e. Coli (> 100K; sens to cipro)  Plan:  -No cipro changes noted -Consider adding a stop date for 01/05/15 -Will sign off for now. Please contact pharmacy with any other needs  Harland German, Pharm D 01/03/2015 8:59 AM

## 2015-01-03 NOTE — Anesthesia Preprocedure Evaluation (Signed)
Anesthesia Evaluation  Patient identified by MRN, date of birth, ID band Patient awake    Reviewed: Allergy & Precautions, NPO status , Patient's Chart, lab work & pertinent test results  Airway Mallampati: I  TM Distance: >3 FB Neck ROM: Full    Dental   Pulmonary former smoker,    Pulmonary exam normal       Cardiovascular hypertension, Pt. on medications Normal cardiovascular exam    Neuro/Psych    GI/Hepatic GERD-  Controlled and Medicated,  Endo/Other    Renal/GU      Musculoskeletal   Abdominal   Peds  Hematology   Anesthesia Other Findings   Reproductive/Obstetrics                             Anesthesia Physical Anesthesia Plan  ASA: III  Anesthesia Plan: General   Post-op Pain Management:    Induction: Intravenous  Airway Management Planned: Mask  Additional Equipment:   Intra-op Plan:   Post-operative Plan:   Informed Consent: I have reviewed the patients History and Physical, chart, labs and discussed the procedure including the risks, benefits and alternatives for the proposed anesthesia with the patient or authorized representative who has indicated his/her understanding and acceptance.     Plan Discussed with: CRNA and Surgeon  Anesthesia Plan Comments:         Anesthesia Quick Evaluation

## 2015-01-03 NOTE — H&P (View-Only) (Signed)
Patient ID: Penny Barnett, female   DOB: 1951/03/06, 64 y.o.   MRN: [id number]  [facility] PROGRESS NOTE  Penny Barnett WUJ:[id number] DOB: 1951/03/06 DOA: 12/30/2014 PCP: Lorretta HarpPANOSH,WANDA KOTVAN, MD   Brief narrative:    64 y.o. female with asthma, hypertension, GERD, hyperlipidemia, who was in her usual state of health until this past Friday when that afternoon she started feeling poorly. She noted at home that her heart rate was high in the 120s to 130s. She also noted that her blood pressure was running low in the 90s to 100 systolic.   Assessment/Plan:     Atrial flutter, CHADS-VAS score 3 - appreciate cardiology team following  - started on Cardizem IV, rate still in 120's  - eliquis started 01/01/2015 - plan for cardioversion on Thursday per cardiology team     Fatigue - she had mild degree of pancytopenia on admission but counts are better this AM  - also pt was somewhat dehydrated on admission based on Hg of 15.6 and sudden drop to 10 -11 - also ? UTI contributing - PT evaluation requested     UTI - continue Cipro day #4/7 - urine culture positive for E. Coli and sensitive to Cipro    Diarrhea - stool studies requested, stool culture, O&P, GI panel, lactoferrin - GI consulted     Hypokalemia - supplemented and WNL this AM    Hypertension - BP has been stable for now with SBP in 120's     Obesity  - Body mass index is 30.3 kg/(m^2)  DVT prophylaxis - pt on Eliquis   Code Status: Full.  Family Communication:  plan of care discussed with the patient Disposition Plan: Home when stable.   IV access:  Peripheral IV  Procedures and diagnostic studies:    Ct Angio Chest Pe W/cm &/or Wo Cm 12/31/2014  No evidence of pulmonary embolus. 2. Nonspecific mildly mosaic pattern of parenchymal attenuation  [facility]  No active disease.    Medical Consultants:  Cardiology   Other Consultants:  PT  IAnti-Infectives:   Cipro 7/18  -->  Debbora PrestoMAGICK-MYERS, ISKRA, MD  [facility] Pager [phone]  If 7PM-7AM, please contact night-coverage [email or web address] Password [email or web address] 01/02/2015, 10:41 AM   LOS: 3 days   HPI/Subjective: No events overnight.   Objective: Filed Vitals:   01/02/15 0200 01/02/15 0500 01/02/15 0736 01/02/15 0829  BP:  105/67  123/71  Pulse:  121    Temp:  98.8 F (37.1 C)    TempSrc:  Oral    Resp: 18 23  30   Height:      Weight:  92.488 kg (203 lb 14.4 oz)    SpO2:  94% 94%     Intake/Output Summary (Last 24 hours) at 01/02/15 1041 Last data filed at 01/02/15 0800  Gross per 24 hour  Intake 5085.33 ml  Output    500 ml  Net 4585.33 ml    Exam:   General:  Pt is alert, follows commands appropriately, not in acute distress  Cardiovascular: Irregular rate and rhythm, no rubs, no gallops  Respiratory: Clear to auscultation bilaterally, no wheezing, no crackles, no rhonchi  Abdomen: Soft, non tender, non distended, bowel sounds present, no guarding  Extremities: No edema, pulses DP and PT palpable bilaterally  Neuro: Grossly nonfocal  Data Reviewed: Basic Metabolic Panel:  Recent Labs Lab 12/30/14 1713 12/30/14 1949 12/30/14 2101 12/31/14 0644 01/01/15 0310 01/02/15 0246  NA 134*  --  135 139 139  138  K 2.6*  --  3.0* 3.3* 3.1* 4.2  CL 99*  --  104 110 110 109  CO2 27  --  19*  GLUCOSE 113*  --  115* 104* 89 98  BUN 15  --  CREATININE 0.72  --  0.57 0.58 0.57 0.56  CALCIUM 8.1*  --  7.4* 7.3* 7.7* 7.6*  MG  --  1.9  --  1.8  --   --    Liver Function Tests:  Recent Labs Lab 12/30/14 1713 12/31/14 0644  AST 21 15  ALT 21 16  ALKPHOS 65 55  BILITOT 0.6 0.3  PROT 6.1* 5.4*  ALBUMIN 2.9* 2.4*   CBC:  Recent Labs Lab 12/30/14 1441 12/30/14 1713 12/31/14 0644 01/01/15 0310 01/02/15 0246  WBC  --  3.3* 2.5* 4.1 4.4  NEUTROABS  --  2.5  --   --   --   HGB 15.6* 12.6 10.9* 11.5* 10.6*  HCT 46.0 38.2 33.1* 35.1* 32.4*  MCV  --  80.4 80.9 81.6 81.2   PLT  --  140* 126* 139* 141*   Cardiac Enzymes:  Recent Labs Lab 12/30/14 1747 12/30/14 2101 12/31/14 0214 12/31/14 0644  TROPONINI 0.03 0.05* 0.03 <0.03   Scheduled Meds: . apixaban  5 mg Oral BID  . bisoprolol  5 mg Oral Daily  . budesonide (PULMICORT) nebulizer solution  0.25 mg Nebulization BID  . ciprofloxacin  500 mg Oral BID  . ipratropium  0.5 mg Inhalation BID  . pantoprazole  40 mg Oral Daily  . saccharomyces boulardii  250 mg Oral BID  . sodium chloride  3 mL Intravenous Q12H   Continuous Infusions: . 0.9 % NaCl with KCl 40 mEq / L 75 mL/hr (01/02/15 0540)  . diltiazem (CARDIZEM) infusion 10 mg/hr (01/02/15 0123)

## 2015-01-03 NOTE — Progress Notes (Signed)
  Echocardiogram Echocardiogram Transesophageal has been performed.  [name] 01/03/2015, 10:40 AM

## 2015-01-03 NOTE — Progress Notes (Signed)
    Subjective:  She remains in AF, A-flutter, rate controlled on IV Diltiazem  Objective:  Vital Signs in the last 24 hours: Temp:  [97.9 F (36.6 C)-98.4 F (36.9 C)] 97.9 F (36.6 C) (07/21 0603) Pulse Rate:  [96-103] 103 (07/21 0603) Resp:  [15-30] 18 (07/21 0603) BP: (92-123)/(55-77) 114/77 mmHg (07/21 0603) SpO2:  [96 %-98 %] 98 % (07/21 0724)  Intake/Output from previous day:  Intake/Output Summary (Last 24 hours) at 01/03/15 0802 Last data filed at 01/03/15 0646  Gross per 24 hour  Intake 602.42 ml  Output    450 ml  Net 152.42 ml    Physical Exam: General appearance: alert and no distress Lungs: decreased at bases, few crackles Lt base Heart: irregularly irregular rhythm and tachycardic Extremities: extremities normal, atraumatic, no cyanosis or edema Skin: pale, warm, dry Neurologic: Grossly normal   Rate: 110-120  Rhythm:AF, A-flutter  Lab Results:  Recent Labs  01/02/15 0246 01/03/15 0340  WBC 4.4 5.1  HGB 10.6* 10.4*  PLT 141* 168    Recent Labs  01/02/15 0246 01/03/15 0340  NA 138 141  K 4.2 3.7  CL 109 111  CO2 19* 25  GLUCOSE 98 101*  BUN 8 <5*  CREATININE 0.56 0.46   No results for input(s): TROPONINI in the last 72 hours.  Invalid input(s): CK, MB No results for input(s): INR in the last 72 hours.  Scheduled Meds: . apixaban  5 mg Oral BID  . bisoprolol  5 mg Oral BID  . budesonide (PULMICORT) nebulizer solution  0.25 mg Nebulization BID  . ciprofloxacin  500 mg Oral BID  . ipratropium  0.5 mg Inhalation BID  . pantoprazole  40 mg Oral Daily  . potassium chloride  20 mEq Oral Once  . saccharomyces boulardii  250 mg Oral BID  . sodium chloride  3 mL Intravenous Q12H   Continuous Infusions: . sodium chloride 20 mL/hr at 01/02/15 2012  . diltiazem (CARDIZEM) infusion 15 mg/hr (01/03/15 0646)   PRN Meds:.acetaminophen **OR** acetaminophen, gi cocktail, ondansetron **OR** ondansetron (ZOFRAN) IV   Imaging: CXR 11/30/14-  NAD  Assessment/Plan:  64 y.o.female with HTN and asthmatic COPD admitted 12/30/14 with atrial flutter with RVR.   Principal Problem:   Atrial flutter with RVR Active Problems:   Asthma   Hypertension   GERD (gastroesophageal reflux disease)   Hyperlipidemia   Fatigue   Hypokalemia   PLAN:   CHADSVASC 3 -  Eliquis 5 mg BID added. TEE/Cardioversion  today. She says she has trouble swallowing pills. K+3.7- will give runs IV.   Corine Shelter PA-C 01/03/2015 8:02 AM

## 2015-01-04 ENCOUNTER — Telehealth: Payer: Self-pay | Admitting: Cardiology

## 2015-01-04 ENCOUNTER — Encounter (HOSPITAL_COMMUNITY): Payer: Self-pay | Admitting: Internal Medicine

## 2015-01-04 DIAGNOSIS — I429 Cardiomyopathy, unspecified: Secondary | ICD-10-CM | POA: Diagnosis present

## 2015-01-04 LAB — BASIC METABOLIC PANEL
ANION GAP: 9 (ref 5–15)
BUN: 9 mg/dL (ref 6–20)
CHLORIDE: 106 mmol/L (ref 101–111)
CO2: 24 mmol/L (ref 22–32)
Calcium: 8.3 mg/dL — ABNORMAL LOW (ref 8.9–10.3)
Creatinine, Ser: 0.67 mg/dL (ref 0.44–1.00)
GFR calc Af Amer: >60 mL/min (ref >60)
GFR calc non Af Amer: >60 mL/min (ref >60)
GLUCOSE: 86 mg/dL (ref 65–99)
Potassium: 4.3 mmol/L (ref 3.5–5.1)
Sodium: 139 mmol/L (ref 135–145)

## 2015-01-04 LAB — CBC
HEMATOCRIT: 34.8 % — AB (ref 36.0–46.0)
HEMOGLOBIN: 11.6 g/dL — AB (ref 12.0–15.0)
MCH: 26.9 pg (ref 26.0–34.0)
MCHC: 33.3 g/dL (ref 30.0–36.0)
MCV: 80.7 fL (ref 78.0–100.0)
Platelets: 161 10*3/uL (ref 150–400)
RBC: 4.31 MIL/uL (ref 3.87–5.11)
RDW: 16.1 % — ABNORMAL HIGH (ref 11.5–15.5)
WBC: 5.3 10*3/uL (ref 4.0–10.5)

## 2015-01-04 LAB — GI PATHOGEN PANEL BY PCR, STOOL
C difficile toxin A/B: NOT DETECTED
CAMPYLOBACTER BY PCR: NOT DETECTED
CRYPTOSPORIDIUM BY PCR: NOT DETECTED
E COLI (ETEC) LT/ST: NOT DETECTED
E coli (STEC): NOT DETECTED
E coli 0157 by PCR: NOT DETECTED
G LAMBLIA BY PCR: NOT DETECTED
NOROVIRUS G1/G2: NOT DETECTED
Rotavirus A by PCR: NOT DETECTED
Salmonella by PCR: NOT DETECTED
Shigella by PCR: NOT DETECTED

## 2015-01-04 MED ORDER — APIXABAN 5 MG PO TABS: 5.0000 mg | ORAL_TABLET | Freq: Two times a day (BID) | ORAL | Status: DC

## 2015-01-04 MED ORDER — BUDESONIDE 0.25 MG/2ML IN SUSP: 0.2500 mg | Freq: Two times a day (BID) | RESPIRATORY_TRACT | Status: DC

## 2015-01-04 MED ORDER — BISOPROLOL FUMARATE 5 MG PO TABS: 5.0000 mg | ORAL_TABLET | Freq: Two times a day (BID) | ORAL | Status: DC

## 2015-01-04 MED ORDER — SACCHAROMYCES BOULARDII 250 MG PO CAPS: 250.0000 mg | ORAL_CAPSULE | Freq: Two times a day (BID) | ORAL | Status: DC

## 2015-01-04 NOTE — Progress Notes (Signed)
BP and HR stable on bisoprolol. No recurrent a-fib since cardioversion. LVEF was reported to be "moderately depressed" but not quantified. Hoepfully this will recover. Recommend outpatient follow-up and consider repeat echo in 3-6 months. Ok for d/c today per primary service.  [name], MD, [facility] Attending Cardiologist [facility]

## 2015-01-04 NOTE — Telephone Encounter (Signed)
7-10 day TCM fu appt with Ward Givens 01-11-15 at 930a

## 2015-01-04 NOTE — Discharge Instructions (Signed)
Apixaban oral tablets What is this medicine? APIXABAN (a PIX a ban) is an anticoagulant (blood thinner). It is used to lower the chance of stroke in people with a medical condition called atrial fibrillation. It is also used to treat or prevent blood clots in the lungs or in the veins. This medicine may be used for other purposes; ask your health care provider or pharmacist if you have questions. COMMON BRAND NAME(S): Eliquis What should I tell my health care provider before I take this medicine? They need to know if you have any of these conditions: -bleeding disorders -bleeding in the brain -blood in your stools (black or tarry stools) or if you have blood in your vomit -history of stomach bleeding -kidney disease -liver disease -mechanical heart valve -an unusual or allergic reaction to apixaban, other medicines, foods, dyes, or preservatives -pregnant or trying to get pregnant -breast-feeding How should I use this medicine? Take this medicine by mouth with a glass of water. Follow the directions on the prescription label. You can take it with or without food. If it upsets your stomach, take it with food. Take your medicine at regular intervals. Do not take it more often than directed. Do not stop taking except on your doctor's advice. Stopping this medicine may increase your risk of a blot clot. Be sure to refill your prescription before you run out of medicine. Talk to your pediatrician regarding the use of this medicine in children. Special care may be needed. Overdosage: If you think you have taken too much of this medicine contact a poison control center or emergency room at once. NOTE: This medicine is only for you. Do not share this medicine with others. What if I miss a dose? If you miss a dose, take it as soon as you can. If it is almost time for your next dose, take only that dose. Do not take double or extra doses. What may interact with this medicine? This medicine may  interact with the following: -aspirin and aspirin-like medicines -certain medicines for fungal infections like ketoconazole and itraconazole -certain medicines for seizures like carbamazepine and phenytoin -certain medicines that treat or prevent blood clots like warfarin, enoxaparin, and dalteparin -clarithromycin -NSAIDs, medicines for pain and inflammation, like ibuprofen or naproxen -rifampin -ritonavir -St. John's wort This list may not describe all possible interactions. Give your health care provider a list of all the medicines, herbs, non-prescription drugs, or dietary supplements you use. Also tell them if you smoke, drink alcohol, or use illegal drugs. Some items may interact with your medicine. What should I watch for while using this medicine? Notify your doctor or health care professional and seek emergency treatment if you develop breathing problems; changes in vision; chest pain; severe, sudden headache; pain, swelling, warmth in the leg; trouble speaking; sudden numbness or weakness of the face, arm, or leg. These can be signs that your condition has gotten worse. If you are going to have surgery, tell your doctor or health care professional that you are taking this medicine. Tell your health care professional that you use this medicine before you have a spinal or epidural procedure. Sometimes people who take this medicine have bleeding problems around the spine when they have a spinal or epidural procedure. This bleeding is very rare. If you have a spinal or epidural procedure while on this medicine, call your health care professional immediately if you have back pain, numbness or tingling (especially in your legs and feet), muscle weakness,  paralysis, or loss of bladder or bowel control. Avoid sports and activities that might cause injury while you are using this medicine. Severe falls or injuries can cause unseen bleeding. Be careful when using sharp tools or knives. Consider using  an Neurosurgeon. Take special care brushing or flossing your teeth. Report any injuries, bruising, or red spots on the skin to your doctor or health care professional. What side effects may I notice from receiving this medicine? Side effects that you should report to your doctor or health care professional as soon as possible: -allergic reactions like skin rash, itching or hives, swelling of the face, lips, or tongue -signs and symptoms of bleeding such as bloody or black, tarry stools; red or dark-brown urine; spitting up blood or brown material that looks like coffee grounds; red spots on the skin; unusual bruising or bleeding from the eye, gums, or nose This list may not describe all possible side effects. Call your doctor for medical advice about side effects. You may report side effects to FDA at 1-800-FDA-1088. Where should I keep my medicine? Keep out of the reach of children. Store at room temperature between 20 and 25 degrees C (68 and 77 degrees F). Throw away any unused medicine after the expiration date. NOTE: This sheet is a summary. It may not cover all possible information. If you have questions about this medicine, talk to your doctor, pharmacist, or health care provider.  2015, Elsevier/Gold Standard. (2013-02-03 11:59:24)   Bisoprolol tablets What is this medicine? BISOPROLOL (bis [place] proe lol) is a beta-blocker. Beta-blockers reduce the workload on the heart and help it to beat more regularly. This medicine is used to treat high blood pressure. This medicine may be used for other purposes; ask your health care provider or pharmacist if you have questions. COMMON BRAND NAME(S): Zebeta What should I tell my health care provider before I take this medicine? They need to know if you have any of these conditions: -chest pain (angina) -diabetes -heart or vessel disease like slow heart rate, worsening heart failure, heart block, sick sinus syndrome or Raynaud's disease -kidney  disease -liver disease -lung or breathing disease, like asthma or emphysema -pheochromocytoma -thyroid disease -an unusual or allergic reaction to bisoprolol, other beta-blockers, medicines, foods, dyes, or preservatives -pregnant or trying to get pregnant -breast-feeding How should I use this medicine? Take this medicine by mouth with a glass of water. Follow the directions on the prescription label. You can take this medicine with or without food. Take your doses at regular intervals. Do not take your medicine more often than directed. Do not stop taking this medicine suddenly. This could lead to serious heart-related effects. Talk to your pediatrician regarding the use of this medicine in children. Special care may be needed. Overdosage: If you think you have taken too much of this medicine contact a poison control center or emergency room at once. NOTE: This medicine is only for you. Do not share this medicine with others. What if I miss a dose? If you miss a dose, take it as soon as you can. If it is almost time for your next dose, take only that dose. Do not take double or extra doses. What may interact with this medicine? This medicine may interact with the following medications: -certain medicines for blood pressure, heart disease, irregular heart beat -NSAIDs, medicines for pain and inflammation, like ibuprofen or naproxen -rifampin This list may not describe all possible interactions. Give your health care provider a list  of all the medicines, herbs, non-prescription drugs, or dietary supplements you use. Also tell them if you smoke, drink alcohol, or use illegal drugs. Some items may interact with your medicine. What should I watch for while using this medicine? Visit your doctor or health care professional for regular checks on your progress. Check your heart rate and blood pressure regularly while you are taking this medicine. Ask your doctor or health care professional what your  heart rate and blood pressure should be, and when you should contact him or her. You may get drowsy or dizzy. Do not drive, use machinery, or do anything that needs mental alertness until you know how this drug affects you. Do not stand or sit up quickly, especially if you are an older patient. This reduces the risk of dizzy or fainting spells. Alcohol can make you more drowsy and dizzy. Avoid alcoholic drinks. This medicine can affect blood sugar levels. If you have diabetes, check with your doctor or health care professional before you change your diet or the dose of your diabetic medicine. Do not treat yourself for coughs, colds, or pain while you are taking this medicine without asking your doctor or health care professional for advice. Some ingredients may increase your blood pressure. What side effects may I notice from receiving this medicine? Side effects that you should report to your doctor or health care professional as soon as possible: -allergic reactions like skin rash, itching or hives, swelling of the face, lips, or tongue -breathing problems -chest pain -cold, tingling, or numb hands or feet -confusion -irregular, slow heartbeat -muscle aches and pains -sweating -swollen legs or ankles -tremors -vomiting Side effects that usually do not require medical attention (report to your doctor or health care professional if they continue or are bothersome): -anxiety -change in sex drive or performance -depression -diarrhea -dry or burning eyes -headache -nausea This list may not describe all possible side effects. Call your doctor for medical advice about side effects. You may report side effects to FDA at 1-800-FDA-1088. Where should I keep my medicine? Keep out of the reach of children. Store at room temperature between 20 and 25 degrees C (68 and 77 degrees F). Protect from moisture. Throw away any unused medicine after the expiration date. NOTE: This sheet is a summary. It  may not cover all possible information. If you have questions about this medicine, talk to your doctor, pharmacist, or health care provider.  2015, Elsevier/Gold Standard. (2013-02-03 14:30:04)  Anticoagulation, Generic Anticoagulants are medicines used to prevent clots from developing in your veins. These medicine are also known as blood thinners. If blood clots are untreated, they could travel to your lungs. This is called a pulmonary embolus. A blood clot in your lungs can be fatal.  Health care providers often use anticoagulants to prevent clots following surgery. Anticoagulants are also used along with aspirin when the heart is not getting enough blood. Another anticoagulant called warfarin is started 2 to 3 days after a rapid-acting injectable anticoagulant is started. The rapid-acting anticoagulants are usually continued until warfarin has begun to work. Your health care provider will judge this length of time by blood tests known as the prothrombin time (PT) and International Normalization Ratio (INR). This means that your blood is at the necessary and best level to prevent clots. RISKS AND COMPLICATIONS  If you have received recent epidural anesthesia, spinal anesthesia, or a spinal tap while receiving anticoagulants, you are at risk for developing a blood clot in or around  the spine. This condition could result in long-term or permanent paralysis.  Because anticoagulants thin your blood, severe bleeding may occur from any tissue or organ. Symptoms of the blood being too thin may include:  Bleeding from the nose or gums that does not stop quickly.  Blood in bowel movements which may appear as bright red, dark, or black tarry stools.  Blood in the urine which may appear as pink, red, or brown urine.  Unusual bruising or bruising easily.  A cut that does not stop bleeding within 10 minutes.  Vomiting blood or continuous nausea for more than 1 day.  Coughing up blood.  Broken blood  vessels in your eye (subconjunctival hemorrhage).  Abdominal or back pain with or without flank bruising.  Sudden, severe headache.  Sudden weakness or numbness of the face, arm, or leg, especially on one side of the body.  Sudden confusion.  Trouble speaking (aphasia) or understanding.  Sudden trouble seeing in one or both eyes.  Sudden trouble walking.  Dizziness.  Loss of balance or coordination.  Vaginal bleeding.  Swelling or pain at an injection site.  Superficial fat tissue death (necrosis) which may cause skin scarring. This is more common in women and may first present as pain in the waist, thighs, or buttocks.  Fever.  Too little anticoagulation continues to allow the risk for blood clots. HOME CARE INSTRUCTIONS   Due to the complications of anticoagulants, it is very important that you take your anticoagulant as directed by your health care provider. Anticoagulants need to be taken exactly as instructed. Be sure you understand all your anticoagulant instructions.  Keep all follow-up appointments with your health care provider as directed. It is very important to keep your appointments. Not keeping appointments could result in a chronic or permanent injury, pain, or disability.  Warfarin. Your health care provider will advise you on the length of treatment (usually 3-6 months, sometimes lifelong).  Take warfarin exactly as directed by your health care provider. It is recommended that you take your warfarin dose at the same time of the day. It is preferred that you take warfarin in the late afternoon. If you have been told to stop taking warfarin, do not resume taking warfarin until directed to do so by your health care provider. Follow your health care provider's instructions if you accidentally take an extra dose or miss a dose of warfarin. It is very important to take warfarin as directed since bleeding or blood clots could result in chronic or permanent injury, pain,  or disability.  Too much and too little warfarin are both dangerous. Too much warfarin increases the risk of bleeding. Too little warfarin continues to allow the risk for blood clots. While taking warfarin, you will need to have regular blood tests to measure your blood clotting time. These blood tests usually include both the prothrombin time (PT) and International Normalized Ratio (INR) tests. The PT and INR results allow your health care provider to adjust your dose of warfarin. The dose can change for many reasons. It is critically important that you have your PT and INR levels drawn exactly as directed. Your warfarin dose may stay the same or change depending on what the PT and INR results are. Be sure to follow up with your health care provider regarding your PT and INR test results and what your warfarin dosage should be.  Many medicines can interfere with warfarin and affect the PT and INR results. You must tell your health care  provider about any and all medicines you take, this includes all vitamins and supplements. Ask your health care provider before taking these. Prescription and over-the-counter medicine consistency is critical to warfarin management. It is important that potential interactions are checked before you start a new medicine. Be especially cautious with aspirin and anti-inflammatory medicines. Ask your health care provider before taking these. Medicines such as antibiotics and acid-reducing medicine can interact with warfarin and can cause an increased warfarin effect. Warfarin can also interfere with the effectiveness of medicines you are taking. Do not take or discontinue any prescribed or over-the-counter medicine except on the advice of your health care provider or pharmacist.  Some vitamins, supplements, and herbal products interfere with the effectiveness of warfarin. Vitamin E may increase the anticoagulant effects of warfarin. Vitamin K may can cause warfarin to be less  effective. Do not take or discontinue any vitamin, supplement, or herbal product except on the advice of your health care provider or pharmacist.  Eat what you normally eat and keep the vitamin K content of your diet consistent. Avoid major changes in your diet, or notify your health care provider before changing your diet. Suddenly getting a lot more vitamin K could cause your blood to clot too quickly. A sudden decrease in vitamin K intake could cause your blood to clot too slowly. These changes in vitamin K intake could lead to dangerous blood clotsor to bleeding. To keep your vitamin K intake consistent, you must be aware of which foods contain moderate or high amounts of vitamin K. Some foods high in vitamin K include spinach, kale, broccoli, cabbage, greens, Brussels sprouts, asparagus, Bok Choy, coleslaw, parsley, and green tea. Arrange a visit with a dietitian to answer your questions.  If you have a loss of appetite or get the stomach flu (viral gastroenteritis), talk to your health care provider as soon as possible. A decrease in your normal vitamin K intake can make you more sensitive to your usual dose of warfarin.  Some medical conditions may increase your risk for bleeding while you are taking warfarin. A fever, diarrhea lasting more than a day, worsening heart failure, or worsening liver function are some medical conditions that could affect warfarin. Contact your health care provider if you have any of these medical conditions.  Alcohol can change the body's ability to handle warfarin. It is best to avoid alcoholic drinks or consume only very small amounts while taking warfarin. Notify your health care provider if you change your alcohol intake. A sudden increase in alcohol use can increase your risk of bleeding. Chronic alcohol use can cause warfarin to be less effective.  Be careful not to cut yourself when using sharp objects or while shaving.  Inform all your health care providers  and your dentist that you take an anticoagulant.  Limit physical activities or sports that could result in a fall or cause injury. Avoid contact sports.  Wear medical alert jewelry or carry a medical alert card. SEEK IMMEDIATE MEDICAL CARE IF:  You cough up blood.  You have dark or black stools or there is bright red blood coming from your rectum.  You vomit blood or have nausea for more than 1 day.  You have blood in the urine or pink colored urine.  You have unusual bruising or have increased bruising.  You have bleeding from the nose or gums that does not stop quickly.  You have a cut that does not stop bleeding within a 2-3 minutes.  You  have sudden weakness or numbness of the face, arm, or leg, especially on one side of the body.  You have sudden confusion.  You have trouble speaking (aphasia) or understanding.  You have sudden trouble seeing in one or both eyes.  You have sudden trouble walking.  You have dizziness.  You have a loss of balance or coordination.  You have a sudden, severe headache.  You have a serious fall or head injury, even if you are not bleeding.  You have swelling or pain at an injection site.  You have unexplained tenderness or pain in the abdomen, back, waist, thighs or buttocks.  You have a fever. Any of these symptoms may represent a serious problem that is an emergency. Do not wait to see if the symptoms will go away. Get medical help right away. Call your local emergency services (911 in [place] Do not drive yourself to the hospital. Document Released: 06/01/2005 Document Revised: 06/06/2013 Document Reviewed: 01/04/2008 [facility] Patient Information 2015 [place], [place]. This information is not intended to replace advice given to you by your health care provider. Make sure you discuss any questions you have with your health care provider.

## 2015-01-04 NOTE — Care Management Note (Addendum)
Case Management Note  Patient Details  Name: Penny Barnett MRN: [id number] Date of Birth: 11-22-1950  Subjective/Objective: Pt admitted for A Flutter. Plan to d/c on Eliquis.                    Action/Plan: CM did call [facility] on [address] and medication Eliquis is available. Benefits check in process for pt. Will make pt aware once completed. No further needs from CM at this time.    Expected Discharge Date:  12/31/14               Expected Discharge Plan:  Home/Self Care  In-House Referral:  NA  Discharge planning Services  CM Consult  Post Acute Care Choice:  NA Choice offered to:  NA  DME Arranged:  N/A DME Agency:  NA  HH Arranged:  NA HH Agency:  NA  Status of Service:  Completed, signed off  Medicare Important Message Given:  Yes-second notification given Date Medicare IM Given:    Medicare IM give by:    Date Additional Medicare IM Given:    Additional Medicare Important Message give by:     If discussed at [place] of Stay Meetings, dates discussed:    Additional Comments: 1407 01-04-15 Jacqlyn Krauss, RN,BSN [phone] Co pay came back as $360.74. Pt had not met deductible yet. CM did call pt and she was d/c. CM did make RN aware. No further needs from CM at this time.   Bethena Roys, RN 01/04/2015, 12:38 PM

## 2015-01-04 NOTE — Discharge Summary (Signed)
Physician Discharge Summary  Penny Barnett:[id number] DOB: 11/13/1950 DOA: 12/30/2014  PCP: Lorretta Harp, MD  Admit date: 12/30/2014 Discharge date: 01/04/2015  Recommendations for Outpatient Follow-up:  1. Pt will need to follow up with PCP in 2-3 weeks post discharge 2. Please obtain BMP to evaluate electrolytes and kidney function 3. Please also check CBC to evaluate Hg and Hct levels 4. Pt advised to follow up with Blima Dessert PA cardiologist, appointment scheduled January 11, 2015 at 9:30 5. Pt also opted to see her GI specialist Dr. Nile Riggs for follow upon diarrhea work up  6. Discussed side effects of Eliquis and Bisoprolol and pt verbalized understanding   Discharge Diagnoses:  Principal Problem:   Atrial flutter with RVR Active Problems:   Asthma   GERD (gastroesophageal reflux disease)   Hypertension   Hyperlipidemia   Fatigue   Hypokalemia   Persistent atrial fibrillation   Discharge Condition: Stable  Diet recommendation: Heart healthy diet discussed in details     Brief narrative:    64 y.o. female with asthma, hypertension, GERD, hyperlipidemia, who was in her usual state of health until this past Friday when that afternoon she started feeling poorly. She noted at home that her heart rate was high in the 120s to 130s. She also noted that her blood pressure was running low in the 90s to 100 systolic.   Assessment/Plan:     Atrial flutter, CHADS-VAS score 3 - appreciate cardiology team following  - s/p cardioversion 7/21 and now in sinus rhythm for 24 hours  - eliquis started 01/01/2015 and pt to continue  - follow up with cardiology July 29th, 2016    Fatigue - she had mild degree of pancytopenia on admission but counts are better this AM  - also pt was somewhat dehydrated on admission based on Hg of 15.6 and sudden drop to 10 -11 - also ? UTI contributing - PT evaluation requested    UTI - urine culture positive for E. Coli and  sensitive to Cipro - pt completed therapy with CIpro    Diarrhea - stool studies requested, stool culture, GI panel negative  - lactoferrin positive in stool - pt wants to follow up with her GI doctor, office called and I have left the message to have pt seen in clinic for follow up for further diarrhea work up    Hypokalemia - supplemented and WNL this AM   Hypertension - BP has been stable for now with SBP in 120's    Obesity  - Body mass index is 30.3 kg/(m^2)   Code Status: Full.  Family Communication: plan of care discussed with the patient Disposition Plan: Home   IV access:  Peripheral IV  Procedures and diagnostic studies:   Ct Angio Chest Pe W/cm &/or Wo Cm 12/31/2014 No evidence of pulmonary embolus. 2. Nonspecific mildly mosaic pattern of parenchymal attenuation  Dg Chest [facility] 12/30/2014 No active disease.   Medical Consultants:  Cardiology   Other Consultants:  PT  IAnti-Infectives:   Cipro 7/18 --> 7/22       Discharge Exam: Filed Vitals:   01/04/15 0400  BP: 126/78  Pulse: 80  Temp: 98.4 F (36.9 C)  Resp: 19   Filed Vitals:   01/03/15 2045 01/03/15 2146 01/04/15 0015 01/04/15 0400  BP: 117/80  124/80 126/78  Pulse: 84  75 80  Temp: 98.7 F (37.1 C)  98.1 F (36.7 C) 98.4 F (36.9 C)  TempSrc: Oral  Oral Oral  Resp: 20  24 19   Height:      Weight:      SpO2: 95% 96% 96% 94%    General: Pt is alert, follows commands appropriately, not in acute distress Cardiovascular: Regular rate and rhythm, S1/S2 +, no murmurs, no rubs, no gallops Respiratory: Clear to auscultation bilaterally, no wheezing, no crackles, no rhonchi Abdominal: Soft, non tender, non distended, bowel sounds +, no guarding   Discharge Instructions  Discharge Instructions    Diet - low sodium heart healthy    Complete by:  As directed      Increase activity slowly    Complete by:  As directed             Medication List     STOP taking these medications        albuterol 108 (90 BASE) MCG/ACT inhaler  Commonly known as:  VENTOLIN HFA     hydrochlorothiazide 25 MG tablet  Commonly known as:  HYDRODIURIL     theophylline 300 MG 12 hr tablet  Commonly known as:  THEODUR      TAKE these medications        apixaban 5 MG Tabs tablet  Commonly known as:  ELIQUIS  Take 1 tablet (5 mg total) by mouth 2 (two) times daily.     bisoprolol 5 MG tablet  Commonly known as:  ZEBETA  Take 1 tablet (5 mg total) by mouth 2 (two) times daily.     budesonide 0.25 MG/2ML nebulizer solution  Commonly known as:  PULMICORT  Take 2 mLs (0.25 mg total) by nebulization 2 (two) times daily.     cholecalciferol 1000 UNITS tablet  Commonly known as:  VITAMIN D  Take 1,000 Units by mouth daily.     cloNIDine 0.1 MG tablet  Commonly known as:  CATAPRES  Take 1 tablet (0.1 mg total) by mouth daily.     cyclobenzaprine 5 MG tablet  Commonly known as:  FLEXERIL  Take 1 tablet (5 mg total) by mouth 3 (three) times daily as needed for muscle spasms.     fluticasone 110 MCG/ACT inhaler  Commonly known as:  FLOVENT HFA  Inhale 1 puff into the lungs 2 (two) times daily.     ipratropium 17 MCG/ACT inhaler  Commonly known as:  ATROVENT HFA  Inhale 2 puffs into the lungs every 6 (six) hours.     lansoprazole 30 MG capsule  Commonly known as:  PREVACID  Take 1 capsule (30 mg total) by mouth daily.     montelukast 10 MG tablet  Commonly known as:  SINGULAIR  Take 1 tablet (10 mg total) by mouth every morning.     multivitamin with minerals tablet  Take 1 tablet by mouth daily.     quinapril 20 MG tablet  Commonly known as:  ACCUPRIL  Take 1 tablet (20 mg total) by mouth daily.     rosuvastatin 10 MG tablet  Commonly known as:  CRESTOR  Take 1 tablet (10 mg total) by mouth daily. 1/2 daily     saccharomyces boulardii 250 MG capsule  Commonly known as:  FLORASTOR  Take 1 capsule (250 mg total) by mouth 2 (two) times  daily.            Follow-up Information    Follow up with Lorretta Harp, MD.   Specialties:  Internal Medicine, Pediatrics   Contact information:   [address] [place] [address] [phone]  Call Debbora Presto, MD.   Specialty:  Internal Medicine   Why:  As needed   Contact information:   [address] [place] [address] [phone]       Follow up with Brenton Grills, MD.   Specialty:  Gastroenterology   Contact information:   [facility] [address] [place] [address] [phone]        The results of significant diagnostics from this hospitalization (including imaging, microbiology, ancillary and laboratory) are listed below for reference.     Microbiology: Recent Results (from the past 240 hour(s))  Urine culture     Status: None   Collection Time: 12/30/14  7:00 PM  Result Value Ref Range Status   Specimen Description URINE, RANDOM  Final   Special Requests NONE  Final   Culture >=100,000 COLONIES/mL ESCHERICHIA COLI  Final   Report Status 01/02/2015 FINAL  Final   Organism ID, Bacteria ESCHERICHIA COLI  Final      Susceptibility   Escherichia coli - MIC*    AMPICILLIN 8 SENSITIVE Sensitive     CEFAZOLIN <=4 SENSITIVE Sensitive     CEFTRIAXONE <=1 SENSITIVE Sensitive     CIPROFLOXACIN <=0.25 SENSITIVE Sensitive     GENTAMICIN <=1 SENSITIVE Sensitive     IMIPENEM <=0.25 SENSITIVE Sensitive     NITROFURANTOIN 32 SENSITIVE Sensitive     TRIMETH/SULFA <=20 SENSITIVE Sensitive     AMPICILLIN/SULBACTAM 4 SENSITIVE Sensitive     PIP/TAZO <=4 SENSITIVE Sensitive     * >=100,000 COLONIES/mL ESCHERICHIA COLI  Clostridium Difficile by PCR (not at [facility])     Status: None   Collection Time: 01/01/15 10:00 AM  Result Value Ref Range Status   C difficile by pcr NEGATIVE NEGATIVE Final  Ova and parasite examination     Status: None   Collection Time: 01/02/15  4:25 PM   Result Value Ref Range Status   Specimen Description STOOL  Final   Special Requests NONE  Final   Ova and parasites   Final    NO OVA OR PARASITES SEEN ABUNDANT YEAST ABUNDANT WBC Performed at [facility]    Report Status 01/03/2015 FINAL  Final     Labs: Basic Metabolic Panel:  Recent Labs Lab 12/30/14 1949  12/31/14 0644 01/01/15 0310 01/02/15 0246 01/03/15 0340 01/04/15 0310  NA  --   < > 139 139 138 141 139  K  --   < > 3.3* 3.1* 4.2 3.7 4.3  CL  --   < > 110 110 109 111 106  CO2  --   < > 23 22 19* 25 24  GLUCOSE  --   < > 104* 89 98 101* 86  BUN  --   < > <5* 9  CREATININE  --   < > 0.58 0.57 0.56 0.46 0.67  CALCIUM  --   < > 7.3* 7.7* 7.6* 8.3* 8.3*  MG 1.9  --  1.8  --   --   --   --   < > = values in this interval not displayed. Liver Function Tests:  Recent Labs Lab 12/30/14 1713 12/31/14 0644  AST 21 15  ALT 21 16  ALKPHOS 65 55  BILITOT 0.6 0.3  PROT 6.1* 5.4*  ALBUMIN 2.9* 2.4*   No results for input(s): LIPASE, AMYLASE in the last 168 hours. No results for input(s): AMMONIA in the last 168 hours. CBC:  Recent Labs Lab  12/30/14 1713 12/31/14 1610 01/01/15 0310 01/02/15 0246 01/03/15 0340 01/04/15 0310  WBC 3.3* 2.5* 4.1 4.4 5.1 5.3  NEUTROABS 2.5  --   --   --   --   --   HGB 12.6 10.9* 11.5* 10.6* 10.4* 11.6*  HCT 38.2 33.1* 35.1* 32.4* 32.1* 34.8*  MCV 80.4 80.9 81.6 81.2 81.3 80.7  PLT 140* 126* 139* 141* 168 161   Cardiac Enzymes:  Recent Labs Lab 12/30/14 1747 12/30/14 2101 12/31/14 0214 12/31/14 0644  TROPONINI 0.03 0.05* 0.03 <0.03   BNP: BNP (last 3 results) No results for input(s): BNP in the last 8760 hours.  ProBNP (last 3 results) No results for input(s): PROBNP in the last 8760 hours.  CBG: No results for input(s): GLUCAP in the last 168 hours.   SIGNED: Time coordinating discharge: Over 30 minutes  Debbora Presto, MD  [facility] 01/04/2015, 10:58 AM Pager [phone]  If  7PM-7AM, please contact night-coverage [email or web address] Password [email or web address]

## 2015-01-06 LAB — STOOL CULTURE

## 2015-01-07 ENCOUNTER — Encounter: Payer: Self-pay | Admitting: Gastroenterology

## 2015-01-07 ENCOUNTER — Telehealth: Payer: Self-pay | Admitting: Internal Medicine

## 2015-01-07 NOTE — Telephone Encounter (Signed)
Patient needs s/p [place] f/u.  Where can I schedule the appointment?  Please and thank you.

## 2015-01-07 NOTE — Telephone Encounter (Signed)
Patient contacted regarding discharge from [facility] on 01/04/15.  Patient understands to follow up with provider Ward Givens NP on 01/11/15 at 9:30 AM at the [facility] office. Patient understands discharge instructions? yes Patient understands medications and regiment? yes Patient understands to bring all medications to this visit? Yes   The pt is doing well since discharge from the hospital and did not have any other questions.

## 2015-01-07 NOTE — Telephone Encounter (Signed)
Below is dc summary   Seeing cardiology July 29th  If doing well can check bmp cbcdiff  Second week of August     And then OV August  4 pm on august 16th or 17th   Should see her gi specialist about  Go sx.  Admit date: 12/30/2014 Discharge date: 01/04/2015  Recommendations for Outpatient Follow-up:  1. Pt will need to follow up with PCP in 2-3 weeks post discharge 2. Please obtain BMP to evaluate electrolytes and kidney function 3. Please also check CBC to evaluate Hg and Hct levels 4. Pt advised to follow up with Blima Dessert PA cardiologist, appointment scheduled January 11, 2015 at 9:30 5. Pt also opted to see her GI specialist Dr. Nile Riggs for follow upon diarrhea work up  6. Discussed side effects of Eliquis and Bisoprolol and pt verbalized understanding  Discharge Diagnoses:  Principal Problem:  Atrial flutter with RVR Active Problems:  Asthma  GERD (gastroesophageal reflux disease)  Hypertension  Hyperlipidemia  Fatigue  Hypokalemia  Persistent atrial fibrillation   Discharge Condition: Stable  Diet recommendation: Heart healthy diet discussed in details   Lab Results  Component Value Date   WBC 5.3 01/04/2015   HGB 11.6* 01/04/2015   HCT 34.8* 01/04/2015   PLT 161 01/04/2015   GLUCOSE 86 01/04/2015   CHOL 174 10/16/2014   TRIG 107.0 10/16/2014   HDL 57.30 10/16/2014   LDLDIRECT 159.2 10/07/2012   LDLCALC 95 10/16/2014   ALT 16 12/31/2014   AST 15 12/31/2014   NA 139 01/04/2015   K 4.3 01/04/2015   CL 106 01/04/2015   CREATININE 0.67 01/04/2015   BUN 9 01/04/2015   CO2 24 01/04/2015   TSH 0.922 12/30/2014

## 2015-01-08 NOTE — Telephone Encounter (Signed)
Pt has been scheduled for hosp fup

## 2015-01-08 NOTE — Telephone Encounter (Signed)
Will hold on this message to see if cardiology repeats lab work.

## 2015-01-11 ENCOUNTER — Encounter: Payer: Self-pay | Admitting: Internal Medicine

## 2015-01-11 ENCOUNTER — Encounter: Payer: Self-pay | Admitting: Nurse Practitioner

## 2015-01-11 ENCOUNTER — Ambulatory Visit (INDEPENDENT_AMBULATORY_CARE_PROVIDER_SITE_OTHER): Payer: Medicare Other | Admitting: Nurse Practitioner

## 2015-01-11 VITALS — BP 110/70 | HR 57 | Ht 65.0 in | Wt 183.0 lb

## 2015-01-11 DIAGNOSIS — I4892 Unspecified atrial flutter: Secondary | ICD-10-CM | POA: Diagnosis not present

## 2015-01-11 DIAGNOSIS — E785 Hyperlipidemia, unspecified: Secondary | ICD-10-CM | POA: Diagnosis not present

## 2015-01-11 DIAGNOSIS — I10 Essential (primary) hypertension: Secondary | ICD-10-CM | POA: Diagnosis not present

## 2015-01-11 LAB — CBC WITH DIFFERENTIAL/PLATELET
BASOS ABS: 0 10*3/uL (ref 0.0–0.1)
BASOS PCT: 0.5 % (ref 0.0–3.0)
Eosinophils Absolute: 0.1 10*3/uL (ref 0.0–0.7)
Eosinophils Relative: 0.9 % (ref 0.0–5.0)
HEMATOCRIT: 35.3 % — AB (ref 36.0–46.0)
Hemoglobin: 11.6 g/dL — ABNORMAL LOW (ref 12.0–15.0)
LYMPHS PCT: 23.7 % (ref 12.0–46.0)
Lymphs Abs: 1.3 10*3/uL (ref 0.7–4.0)
MCHC: 32.7 g/dL (ref 30.0–36.0)
MCV: 82.2 fl (ref 78.0–100.0)
MONOS PCT: 7.9 % (ref 3.0–12.0)
Monocytes Absolute: 0.4 10*3/uL (ref 0.1–1.0)
Neutro Abs: 3.7 10*3/uL (ref 1.4–7.7)
Neutrophils Relative %: 67 % (ref 43.0–77.0)
PLATELETS: 271 10*3/uL (ref 150.0–400.0)
RBC: 4.29 Mil/uL (ref 3.87–5.11)
RDW: 16.5 % — ABNORMAL HIGH (ref 11.5–15.5)
WBC: 5.5 10*3/uL (ref 4.0–10.5)

## 2015-01-11 LAB — BASIC METABOLIC PANEL
BUN: 14 mg/dL (ref 6–23)
CALCIUM: 9.7 mg/dL (ref 8.4–10.5)
CHLORIDE: 100 meq/L (ref 96–112)
CO2: 31 meq/L (ref 19–32)
Creatinine, Ser: 0.65 mg/dL (ref 0.40–1.20)
GFR: 96.9 mL/min (ref >60.00)
Glucose, Bld: 74 mg/dL (ref 70–99)
POTASSIUM: 3.7 meq/L (ref 3.5–5.1)
Sodium: 141 mEq/L (ref 135–145)

## 2015-01-11 NOTE — Patient Instructions (Signed)
Medication Instructions:  None  Labwork: BMET and CBC with Diff today  Testing/Procedures: None  Follow-Up: Your physician recommends that you schedule a follow-up appointment in: 3 months with Dr. Tenny Craw.    Any Other Special Instructions Will Be Listed Below (If Applicable).

## 2015-01-11 NOTE — Telephone Encounter (Signed)
Lab work performed by cardiology

## 2015-01-11 NOTE — Progress Notes (Signed)
Patient Name: Penny Barnett Date of Encounter: 01/11/2015  Primary Care Provider:  Lorretta Harp, MD Primary Cardiologist:  Pt will f/u with Dr. Tenny Craw  Chief Complaint  64 year old female status post recent TEE and cardioversion for atrial flutter who presents for follow-up.  Past Medical History   Past Medical History  Diagnosis Date  . GERD (gastroesophageal reflux disease)     2000 taking otc prevacid   evey day.   . Hypertension      3 meds controlled  . Hyperlipidemia   . Kidney stone   . Asthma     as a child and then attack 1996  poss allergic to cat hair. ventilated x 2 . saw Dr [place] Callas then .   Marland Kitchen Atrial flutter     a. 12/2014 Echo: EF 60-65%, no rwma, mild MR;  b. S/P TEE/DCCV;  c. CHA2DS2VASc = 3-->Eliquis.   Past Surgical History  Procedure Laterality Date  . Cataract extraction  10/14/10    both eyes  . Shoulder surgery      frozen shoulder rt 2006, lf 2000 blackman.  Rhae Hammock without cardioversion N/A 01/03/2015    Procedure: TRANSESOPHAGEAL ECHOCARDIOGRAM (TEE);  Surgeon: Pricilla Riffle, MD;  Location: [facility] ENDOSCOPY;  Service: Cardiovascular;  Laterality: N/A;  . Cardioversion N/A 01/03/2015    Procedure: CARDIOVERSION;  Surgeon: Pricilla Riffle, MD;  Location: [facility] ENDOSCOPY;  Service: Cardiovascular;  Laterality: N/A;    Allergies  Allergies  Allergen Reactions  . Penicillins Other (See Comments)    Childhood      HPI  65 year old female with the above problem list. She was recently admitted to [facility] with complaints of palpitations and diarrhea. She was found to be in atrial flutter with rapid ventricular response. She was placed on beta blocker and eliquis therapy and subsequently underwent TEE and successful cardioversion. Since discharge, she has done well without any recurrence of palpitations. She denies chest pain, PND, orthopnea, dyspnea, dizziness, syncope, edema, or early satiety. She is tolerating her medications well. Her diarrhea has  improved some.  Home Medications  Prior to Admission medications   Medication Sig Start Date End Date Taking? Authorizing Provider  apixaban (ELIQUIS) 5 MG TABS tablet Take 1 tablet (5 mg total) by mouth 2 (two) times daily. 01/04/15  Yes Dorothea Ogle, MD  bisoprolol (ZEBETA) 5 MG tablet Take 1 tablet (5 mg total) by mouth 2 (two) times daily. 01/04/15  Yes Dorothea Ogle, MD  budesonide (PULMICORT) 0.25 MG/2ML nebulizer solution Take 2 mLs (0.25 mg total) by nebulization 2 (two) times daily. 01/04/15  Yes Dorothea Ogle, MD  cholecalciferol (VITAMIN D) 1000 UNITS tablet Take 1,000 Units by mouth daily.     Yes Historical Provider, MD  cloNIDine (CATAPRES) 0.1 MG tablet Take 1 tablet (0.1 mg total) by mouth daily. 11/14/14  Yes Madelin Headings, MD  cyclobenzaprine (FLEXERIL) 5 MG tablet Take 1 tablet (5 mg total) by mouth 3 (three) times daily as needed for muscle spasms. 10/16/13  Yes Madelin Headings, MD  fluticasone (FLOVENT HFA) 110 MCG/ACT inhaler Inhale 1 puff into the lungs 2 (two) times daily. 10/16/13  Yes Madelin Headings, MD  hydrochlorothiazide (HYDRODIURIL) 25 MG tablet Take 25 mg by mouth daily.  11/15/14  Yes Historical Provider, MD  ipratropium (ATROVENT HFA) 17 MCG/ACT inhaler Inhale 2 puffs into the lungs 2 (two) times daily.   Yes Historical Provider, MD  lansoprazole (PREVACID) 30 MG capsule Take 1 capsule (30  mg total) by mouth daily. 10/16/13  Yes Madelin Headings, MD  montelukast (SINGULAIR) 10 MG tablet Take 1 tablet (10 mg total) by mouth every morning. 11/14/14  Yes Madelin Headings, MD  Multiple Vitamins-Minerals (MULTIVITAMIN WITH MINERALS) tablet Take 1 tablet by mouth daily.     Yes Historical Provider, MD  quinapril (ACCUPRIL) 20 MG tablet Take 1 tablet (20 mg total) by mouth daily. 11/14/14  Yes Madelin Headings, MD  rosuvastatin (CRESTOR) 10 MG tablet Take 1 tablet (10 mg total) by mouth daily. 1/2 daily Patient taking differently: Take 5 mg by mouth daily.  04/13/11  Yes Madelin Headings, MD    saccharomyces boulardii (FLORASTOR) 250 MG capsule Take 1 capsule (250 mg total) by mouth 2 (two) times daily. 01/04/15  Yes Dorothea Ogle, MD  theophylline (THEODUR) 300 MG 12 hr tablet Take 300 mg by mouth daily.  11/15/14  Yes Historical Provider, MD  VENTOLIN HFA 108 (90 BASE) MCG/ACT inhaler Inhale 1-2 puffs into the lungs as needed for wheezing or shortness of breath.  11/15/14  Yes Historical Provider, MD    Review of Systems  As above, doing well since discharge.  She denies chest pain, palpitations, dyspnea, pnd, orthopnea, n, v, dizziness, syncope, edema, weight gain, or early satiety.  She does continue to have intermittent diarrhea though overall this is improved. She has follow-up with GI next week.  All other systems reviewed and are otherwise negative except as noted above.  Physical Exam  VS:  BP 110/70 mmHg  Pulse 57  Ht  (1.651 m)  Wt 183 lb (83.008 kg)  BMI 30.45 kg/m2 , BMI Body mass index is 30.45 kg/(m^2). GEN: Well nourished, well developed, in no acute distress. HEENT: normal. Neck: Supple, no JVD, carotid bruits, or masses. Cardiac: RRR, no murmurs, rubs, or gallops. No clubbing, cyanosis, edema.  Radials/DP/PT 2+ and equal bilaterally.  Respiratory:  Respirations regular and unlabored, clear to auscultation bilaterally. GI: Soft, nontender, nondistended, BS + x 4. MS: no deformity or atrophy. Skin: warm and dry, no rash. Neuro:  Strength and sensation are intact. Psych: Normal affect.  Accessory Clinical Findings  ECG - sinus bradycardia, 57, PA-C, no acute ST or T changes.  Assessment & Plan  1.  Paroxysmal atrial flutter: Status post successful TEE and cardioversion earlier this month. She has not had any recurrence of palpitations and is in sinus rhythm today. In the setting of a CHA2DS2VASc of 3, she remains on eliquis therapy and is tolerating this well. She also is tolerating bisoprolol without any wheezing, despite her history of asthma. We will  check a CBC and basic metabolic panel today given new eliquis therapy.  2. Essential hypertension: Stable on current medications. No changes today.  3.  HL:  LDL 95 in May of this year with normal LFTs on recent evaluation. Continue statin therapy.  4. Asthma: Stable on inhaler therapy. No active wheezing. She is tolerating beta blocker.  5. Diarrhea: This has improved with the addition of florastor.  She has f/u with GI next week.  6.  Disposition:  F/u bmet and cbc w/ diff and bmet today.  She will f/u with Dr. Tenny Craw at her request in ~ 3 mos.    Nicolasa Ducking, NP 01/11/2015, 10:03 AM

## 2015-01-21 ENCOUNTER — Other Ambulatory Visit: Payer: Self-pay | Admitting: *Deleted

## 2015-01-21 MED ORDER — BISOPROLOL FUMARATE 5 MG PO TABS: 5.0000 mg | ORAL_TABLET | Freq: Two times a day (BID) | ORAL | Status: DC

## 2015-01-21 MED ORDER — APIXABAN 5 MG PO TABS: 5.0000 mg | ORAL_TABLET | Freq: Two times a day (BID) | ORAL | Status: DC

## 2015-01-22 ENCOUNTER — Other Ambulatory Visit: Payer: Medicare Other

## 2015-01-29 ENCOUNTER — Ambulatory Visit (INDEPENDENT_AMBULATORY_CARE_PROVIDER_SITE_OTHER): Payer: Medicare Other | Admitting: Internal Medicine

## 2015-01-29 ENCOUNTER — Encounter: Payer: Self-pay | Admitting: Internal Medicine

## 2015-01-29 VITALS — BP 124/72 | Temp 98.3°F | Ht 65.0 in | Wt 181.9 lb

## 2015-01-29 DIAGNOSIS — L989 Disorder of the skin and subcutaneous tissue, unspecified: Secondary | ICD-10-CM

## 2015-01-29 DIAGNOSIS — E876 Hypokalemia: Secondary | ICD-10-CM | POA: Diagnosis not present

## 2015-01-29 DIAGNOSIS — Z09 Encounter for follow-up examination after completed treatment for conditions other than malignant neoplasm: Secondary | ICD-10-CM

## 2015-01-29 DIAGNOSIS — I10 Essential (primary) hypertension: Secondary | ICD-10-CM

## 2015-01-29 DIAGNOSIS — R197 Diarrhea, unspecified: Secondary | ICD-10-CM | POA: Diagnosis not present

## 2015-01-29 NOTE — Patient Instructions (Signed)
Uncertain cause  Of your diarrhea .    Diuretic can add to  The low potassium    Stop the  Magnesium for now and hold the diuretic  Short term  Until sorted out.  Check bp readings  To make sure not rising .  Will try to get you in to  Gi earlier than October .  consideration of  Adding empiric metronidazole .   Contact us in about 7-10 days about  How the finger and diarrhea is doing.  Warm on the  Finger   And  Keep covered  If progressing may need to recheck poss skin infection.

## 2015-01-29 NOTE — Progress Notes (Signed)
Pre visit review using our clinic review tool, if applicable. No additional management support is needed unless otherwise documented below in the visit note.  Chief Complaint  Patient presents with  . Post Hospital Follow Up    low k, diarrhea  aflutter sp DCCVbump on finger    HPI: Patient comes in today as follow up from hospitalization 7 17  forhypokaemia with diarrhea and Aflutter  and dccv.  On b blocker and elquis . To see cardiology in the fall.  dairrhea has been going on about 2 months waxed and waned but then got worse when was admitted to the hospital. Was negative for C. difficile but did have a UTI was treated with Cipro.  Was better when  Got home and now back   After  Post prandial   Within  30 45 minutes  Runny  No blood  And tried  proibiotic and helped  And more formed.  Eating normal food .  No abd pain . However stools are back to the way they were before runny no blood postprandial question some weight loss no fever and chills. Was to follow-up with gastroenterology but was toldCant be seen Dr Arlyce Dice . until October coonscopy 2012 and was ok.  at that time she did have some problems with diarrhea also but then was better for a long time.   No recent travel of significance she does work in the barn but no other specific exposures no recent antibiotically except the Cipro in house.   She noted a pink area slightly sore on her right index finger 2 days ago and hasn't really changed she used ice on it think he was an injury or is staying no itching has arthritis no fever or Poss. ROS: See pertinent positives and negatives per HPI. no chest pain shortness of breath or cough. She has been taking a low dose of magnesium at night to help her with sleep something like to 250 mg.   Past Medical History  Diagnosis Date  . GERD (gastroesophageal reflux disease)     2000 taking otc prevacid   evey day.   . Hypertension      3 meds controlled  . Hyperlipidemia   . Kidney stone     . Asthma     as a child and then attack 1996  poss allergic to cat hair. ventilated x 2 . saw Dr [place] Callas then .   Marland Kitchen Atrial flutter     a. 12/2014 Echo: EF 60-65%, no rwma, mild MR;  b. S/P TEE/DCCV;  c. CHA2DS2VASc = 3-->Eliquis.    Family History  Problem Relation Age of Onset  . Breast cancer Mother   . Cancer Mother     bladder  . Uterine cancer Sister   . Colon cancer Neg Hx   . Alcohol abuse Father   . Heart attack Neg Hx   . Stroke Neg Hx   . Hypertension Neg Hx     Social History   Social History  . Marital Status: Single    Spouse Name: N/A  . Number of Children: N/A  . Years of Education: N/A   Social History Main Topics  . Smoking status: Former Smoker    Quit date: 06/21/1986  . Smokeless tobacco: Never Used  . Alcohol Use: No  . Drug Use: No  . Sexual Activity: Not Asked   Other Topics Concern  . 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    HS education  single   6 hours of sleep   Hh  Of 1  Pet dog an horse.    Remote tobacco  1-2 ppd stopped 1988    Etoh couple times a month rare    ocass  Diet soda.                 Outpatient Prescriptions Prior to Visit  Medication Sig Dispense Refill  . apixaban (ELIQUIS) 5 MG TABS tablet Take 1 tablet (5 mg total) by mouth 2 (two) times daily. 180 tablet 0  . bisoprolol (ZEBETA) 5 MG tablet Take 1 tablet (5 mg total) by mouth 2 (two) times daily. 180 tablet 0  . cholecalciferol (VITAMIN D) 1000 UNITS tablet Take 1,000 Units by mouth daily.      . cloNIDine (CATAPRES) 0.1 MG tablet Take 1 tablet (0.1 mg total) by mouth daily. 180 tablet 3  . cyclobenzaprine (FLEXERIL) 5 MG tablet Take 1 tablet (5 mg total) by mouth 3 (three) times daily as needed for muscle spasms. 30 tablet 2  . fluticasone (FLOVENT HFA) 110 MCG/ACT inhaler Inhale 1 puff into the lungs 2 (two) times  daily. 3 Inhaler 3  . hydrochlorothiazide (HYDRODIURIL) 25 MG tablet Take 25 mg by mouth daily.     Marland Kitchen ipratropium (ATROVENT HFA) 17 MCG/ACT inhaler Inhale 2 puffs into the lungs 2 (two) times daily.    . lansoprazole (PREVACID) 30 MG capsule Take 1 capsule (30 mg total) by mouth daily. 90 capsule 3  . montelukast (SINGULAIR) 10 MG tablet Take 1 tablet (10 mg total) by mouth every morning. 90 tablet 3  . Multiple Vitamins-Minerals (MULTIVITAMIN WITH MINERALS) tablet Take 1 tablet by mouth daily.      . quinapril (ACCUPRIL) 20 MG tablet Take 1 tablet (20 mg total) by mouth daily. 90 tablet 3  . rosuvastatin (CRESTOR) 10 MG tablet Take 1 tablet (10 mg total) by mouth daily. 1/2 daily (Patient taking differently: Take 5 mg by mouth daily. ) 90 tablet 3  . saccharomyces boulardii (FLORASTOR) 250 MG capsule Take 1 capsule (250 mg total) by mouth 2 (two) times daily. 60 capsule 0  . theophylline (THEODUR) 300 MG 12 hr tablet Take 300 mg by mouth daily.     . VENTOLIN HFA 108 (90 BASE) MCG/ACT inhaler Inhale 1-2 puffs into the lungs as needed for wheezing or shortness of breath.     . budesonide (PULMICORT) 0.25 MG/2ML nebulizer solution Take 2 mLs (0.25 mg total) by nebulization 2 (two) times daily. (Patient not taking: Reported on 01/29/2015) 60 mL 12   No facility-administered medications prior to visit.     EXAM:  BP 124/72 mmHg  Temp(Src) 98.3 F (36.8 C) (Oral)  Ht 5\' 5"  (1.651 m)  Wt 181 lb 14.4 oz (82.509 kg)  BMI 30.27 kg/m2  Body mass index is 30.27 kg/(m^2).  GENERAL: vitals reviewed and listed above, alert, oriented, appears well hydrated and in no acute distress HEENT: atraumatic, conjunctiva  clear, no obvious abnormalities on inspection of external nose and ears OP : no lesion edema or exudate  NECK: no obvious masses on inspection palpation  LUNGS: clear to auscultation bilaterally, no wheezes, rales or rhonchi, good air movement CV: HRRR, no clubbing cyanosis or  peripheral  edema nl cap  refill  Abdomen soft without organomegaly guarding or rebound MS: moves all extremities without noticeable focal  abnormality there are DJD changes of her fingers. Right index finger near the PIP is a 5 mm red spot with the central pustule-looking lesion that is subcutaneous without assisting for. No streaking good range of motion not particularly tender.  PSYCH: pleasant and cooperative, no obvious depression or anxiety Lab Results  Component Value Date   WBC 5.5 01/11/2015   HGB 11.6* 01/11/2015   HCT 35.3* 01/11/2015   PLT 271.0 01/11/2015   GLUCOSE 74 01/11/2015   CHOL 174 10/16/2014   TRIG 107.0 10/16/2014   HDL 57.30 10/16/2014   LDLDIRECT 159.2 10/07/2012   LDLCALC 95 10/16/2014   ALT 16 12/31/2014   AST 15 12/31/2014   NA 141 01/11/2015   K 3.7 01/11/2015   CL 100 01/11/2015   CREATININE 0.65 01/11/2015   BUN 14 01/11/2015   CO2 31 01/11/2015   TSH 0.922 12/30/2014   BP Readings from Last 3 Encounters:  01/29/15 124/72  01/11/15 110/70  01/04/15 126/78   Wt Readings from Last 3 Encounters:  01/29/15 181 lb 14.4 oz (82.509 kg)  01/11/15 183 lb (83.008 kg)  01/02/15 203 lb 14.4 oz (92.488 kg)     ASSESSMENT AND PLAN:  Discussed the following assessment and plan:  Diarrhea - 2 months  post prandial  no blood fever  interim aflutter uti rx cipro and low K   Essential hypertension  Skin lesion - ? early pustule  right index finger  nontender  no  fluctuance  or warmth    Hypokalemia - resolved  Gi  Visit is not planned for almost 2 months. I think she should be seen by GI before that time because of her diarrhea that contributed to her hospitalization and hypokalemia. She should stop the magnesium although it is a low dose. Check for alarm symptoms. Consider empiric treatment with metronidazole. She was checked for C. difficile and hospital and then put on anti-biotics for a UTI. This could've upset the balance of her bowel. She looks clinically stable  today. Uncertain what to make of the lesion on the right finger looks like it could be like a bite or localized skin infection local care close observation and follow-up if persistent progressive. Wouldn't use ice use warm or nothing keep covered.  -Patient advised to return or notify health care team  if symptoms worsen ,persist or new concerns arise.  Patient Instructions  Uncertain cause  Of your diarrhea .    Diuretic can add to  The low potassium    Stop the  Magnesium for now and hold the diuretic  Short term  Until sorted out.  Check bp readings  To make sure not rising .  Will try to get you in to  Gi earlier than October .  consideration of  Adding empiric metronidazole .   Contact us in about 7-10 days about  How the finger and diarrhea is doing.  Warm on the  Finger   And  Keep covered  If progressing may need to recheck poss skin infection.       Neta Mends. Panosh M.D.

## 2015-02-01 ENCOUNTER — Encounter: Payer: Self-pay | Admitting: Internal Medicine

## 2015-02-05 NOTE — Telephone Encounter (Signed)
Called and patient's new appointment is August 30th at 8:30am with PA Doug Sou with GI. Patient is aware.

## 2015-02-11 ENCOUNTER — Encounter: Payer: Self-pay | Admitting: Internal Medicine

## 2015-02-11 ENCOUNTER — Other Ambulatory Visit: Payer: Self-pay | Admitting: Internal Medicine

## 2015-02-12 ENCOUNTER — Encounter: Payer: Self-pay | Admitting: Internal Medicine

## 2015-02-12 ENCOUNTER — Ambulatory Visit (INDEPENDENT_AMBULATORY_CARE_PROVIDER_SITE_OTHER): Payer: Medicare Other | Admitting: Internal Medicine

## 2015-02-12 ENCOUNTER — Encounter: Payer: Self-pay | Admitting: Gastroenterology

## 2015-02-12 ENCOUNTER — Ambulatory Visit (INDEPENDENT_AMBULATORY_CARE_PROVIDER_SITE_OTHER): Payer: Medicare Other | Admitting: Gastroenterology

## 2015-02-12 VITALS — BP 110/72 | Temp 98.1°F | Ht 65.0 in | Wt 184.1 lb

## 2015-02-12 VITALS — BP 120/68 | HR 72 | Ht 65.0 in | Wt 183.4 lb

## 2015-02-12 DIAGNOSIS — L304 Erythema intertrigo: Secondary | ICD-10-CM | POA: Diagnosis not present

## 2015-02-12 DIAGNOSIS — Z7901 Long term (current) use of anticoagulants: Secondary | ICD-10-CM | POA: Diagnosis not present

## 2015-02-12 DIAGNOSIS — R197 Diarrhea, unspecified: Secondary | ICD-10-CM

## 2015-02-12 DIAGNOSIS — Z23 Encounter for immunization: Secondary | ICD-10-CM

## 2015-02-12 MED ORDER — TRIAMCINOLONE ACETONIDE 0.025 % EX CREA: 1.0000 "application " | TOPICAL_CREAM | Freq: Two times a day (BID) | CUTANEOUS | Status: DC

## 2015-02-12 MED ORDER — METRONIDAZOLE 500 MG PO TABS: 500.0000 mg | ORAL_TABLET | Freq: Two times a day (BID) | ORAL | Status: DC

## 2015-02-12 MED ORDER — KETOCONAZOLE 2 % EX CREA: 1.0000 "application " | TOPICAL_CREAM | Freq: Two times a day (BID) | CUTANEOUS | Status: DC

## 2015-02-12 NOTE — Progress Notes (Signed)
Pre visit review using our clinic review tool, if applicable. No additional management support is needed unless otherwise documented below in the visit note.  Chief Complaint  Patient presents with  . Rash    HPI: Patient Penny Barnett  comes in today for SDA for  new problem evaluation. She actually had some rash after getting out of the hospital and has waxed and waned. Has put many things on it including calamine low-dose cortisone Neosporin [place] spray. Is somewhat getting worse itches no fever. She has a remote history of a similar problem and was given triamcinolone 0.1%. She is re-sleep been put on metronidazole for the diarrhea see how she does. She has no vaginal symptoms. ROS: See pertinent positives and negatives per HPI.  Past Medical History  Diagnosis Date  . GERD (gastroesophageal reflux disease)     2000 taking otc prevacid   evey day.   . Hypertension      3 meds controlled  . Hyperlipidemia   . Kidney stone   . Asthma     as a child and then attack 1996  poss allergic to cat hair. ventilated x 2 . saw Dr [place] Callas then .   Marland Kitchen Atrial flutter     a. 12/2014 Echo: EF 60-65%, no rwma, mild MR;  b. S/P TEE/DCCV;  c. CHA2DS2VASc = 3-->Eliquis.    Family History  Problem Relation Age of Onset  . Breast cancer Mother   . Cancer Mother     bladder  . Uterine cancer Sister   . Colon cancer Neg Hx   . Alcohol abuse Father   . Heart attack Neg Hx   . Stroke Neg Hx   . Hypertension Neg Hx     Social History   Social History  . Marital Status: Single    Spouse Name: N/A  . Number of Children: N/A  . Years of Education: N/A   Occupational History  . CSR    Social History Main Topics  . Smoking status: Former Smoker    Quit date: 06/21/1986  . Smokeless tobacco: Never Used  . Alcohol Use: No  . Drug Use: No  . Sexual Activity: Not Asked   Other Topics Concern  . 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    HS education  single   6 hours of sleep   Hh  Of 1  Pet dog an horse.    Remote tobacco  1-2 ppd stopped 1988    Etoh couple times a month rare    ocass  Diet soda.                 Outpatient Prescriptions Prior to Visit  Medication Sig Dispense Refill  . apixaban (ELIQUIS) 5 MG TABS tablet Take 1 tablet (5 mg total) by mouth 2 (two) times daily. 180 tablet 0  . bisoprolol (ZEBETA) 5 MG tablet Take 1 tablet (5 mg total) by mouth 2 (two) times daily. 180 tablet 0  . cholecalciferol (VITAMIN D) 1000 UNITS tablet Take 1,000 Units by mouth daily.      . cloNIDine (CATAPRES) 0.1 MG tablet Take 1 tablet (0.1 mg total) by mouth daily. 180 tablet 3  . cyclobenzaprine (FLEXERIL) 5 MG tablet Take 1 tablet (  5 mg total) by mouth 3 (three) times daily as needed for muscle spasms. 30 tablet 2  . fluticasone (FLOVENT HFA) 110 MCG/ACT inhaler Inhale 1 puff into the lungs 2 (two) times daily. 3 Inhaler 3  . hydrochlorothiazide (HYDRODIURIL) 25 MG tablet Take 25 mg by mouth daily.     Marland Kitchen ipratropium (ATROVENT HFA) 17 MCG/ACT inhaler Inhale 2 puffs into the lungs 2 (two) times daily.    . lansoprazole (PREVACID) 30 MG capsule Take 1 capsule (30 mg total) by mouth daily. 90 capsule 3  . metroNIDAZOLE (FLAGYL) 500 MG tablet Take 1 tablet (500 mg total) by mouth 2 (two) times daily. 20 tablet 0  . montelukast (SINGULAIR) 10 MG tablet Take 1 tablet (10 mg total) by mouth every morning. 90 tablet 3  . Multiple Vitamins-Minerals (MULTIVITAMIN WITH MINERALS) tablet Take 1 tablet by mouth daily.      . quinapril (ACCUPRIL) 20 MG tablet Take 1 tablet (20 mg total) by mouth daily. 90 tablet 3  . rosuvastatin (CRESTOR) 10 MG tablet Take 1 tablet (10 mg total) by mouth daily. 1/2 daily (Patient taking differently: Take 5 mg by mouth daily. ) 90 tablet 3  . saccharomyces boulardii (FLORASTOR) 250 MG capsule Take 1 capsule  (250 mg total) by mouth 2 (two) times daily. 60 capsule 0  . theophylline (THEODUR) 300 MG 12 hr tablet Take 300 mg by mouth daily.     . VENTOLIN HFA 108 (90 BASE) MCG/ACT inhaler Inhale 1-2 puffs into the lungs as needed for wheezing or shortness of breath.     . budesonide (PULMICORT) 0.25 MG/2ML nebulizer solution Take 2 mLs (0.25 mg total) by nebulization 2 (two) times daily. (Patient not taking: Reported on 01/29/2015) 60 mL 12   No facility-administered medications prior to visit.     EXAM:  BP 110/72 mmHg  Temp(Src) 98.1 F (36.7 C) (Oral)  Ht  (1.651 m)  Wt 184 lb 1.6 oz (83.507 kg)  BMI 30.64 kg/m2  Body mass index is 30.64 kg/(m^2).  GENERAL: vitals reviewed and listed above, alert, oriented, appears well hydrated and in no acute distress HEENT: atraumatic, conjunctiva  clear, no obvious abnormalities on inspection of external nose and ears OP : no lesion edema or exudate  NECK: no obvious masses on inspection palpation  Inguinal area into the perineum there is intertriginous rash with a few satellite lesions more of a pink dusky color no discrete edges. No weeping is noted. No adenopathy. PSYCH: pleasant and cooperative, no obvious depression or anxiety  ASSESSMENT AND PLAN:  Discussed the following assessment and plan:  Intertrigo  Need for prophylactic vaccination and inoculation against influenza - Plan: Flu vaccine HIGH DOSE PF (Fluzone High dose)  -Patient advised to return or notify health care team  if symptoms worsen ,persist or new concerns arise.  Patient Instructions  This is intertrigo. Often yeast involved on skin   Add antifungal cream Twice a day for 1-2 weeks    Also low potency  coritsone for itching     Keep dry as possible   Contact us if not getting better spreading fever  Intertrigo Intertrigo is a skin condition that occurs in between folds of skin in places on the body that rub together a lot and do not get much ventilation. It is  caused by heat, moisture, friction, sweat retention, and lack of air circulation, which produces red, irritated patches and, sometimes, scaling or drainage. People who have diabetes, who are obese, or  who have treatment with antibiotics are at increased risk for intertrigo. The most common sites for intertrigo to occur include:  The groin.  The breasts.  The armpits.  Folds of abdominal skin.  Webbed spaces between the fingers or toes. Intertrigo may be aggravated by:  Sweat.  Feces.  Yeast or bacteria that are present near skin folds.  Urine.  Vaginal discharge. HOME CARE INSTRUCTIONS  The following steps can be taken to reduce friction and keep the affected area cool and dry:  Expose skin folds to the air.  Keep deep skin folds separated with cotton or linen cloth. Avoid tight fitting clothing that could cause chafing.  Wear open-toed shoes or sandals to help reduce moisture between the toes.  Apply absorbent powders to affected areas as directed by your caregiver.  Apply over-the-counter barrier pastes, such as zinc oxide, as directed by your caregiver.  If you develop a fungal infection in the affected area, your caregiver may have you use antifungal creams. SEEK MEDICAL CARE IF:   The rash is not improving after 1 week of treatment.  The rash is getting worse (more red, more swollen, more painful, or spreading).  You have a fever or chills. MAKE SURE YOU:   Understand these instructions.  Will watch your condition.  Will get help right away if you are not doing well or get worse. Document Released: 06/01/2005 Document Revised: 08/24/2011 Document Reviewed: 11/14/2009 [facility] Patient Information 2015 [place], [place]. This information is not intended to replace advice given to you by your health care provider. Make sure you discuss any questions you have with your health care provider.      Neta Mends. Panosh M.D.

## 2015-02-12 NOTE — Progress Notes (Signed)
02/12/2015 Penny Barnett [id number] [birth year]   HISTORY OF PRESENT ILLNESS:  This is a pleasant 64 year old female who is known to Dr. Arlyce Dice for colonoscopy in 02/2011 at which time the study was normal except for moderate diverticulosis; repeat recommended in 10 years from that time.  She presents to our office today with complaints of diarrhea that has been present for about the past 3-4 months.  Having diarrhea about 30-40 minutes after eating.  was watery initially but now loose.  Has a lot of urgency with it as well.  No blood.  Says that she had some issues with diarrhea in the past, but it went away for years, but now has been battling it again.  Was recently hospitalized and diagnosed with atrial flutter, which was cardioverted and she was placed on Eliquis in July.  During that hospitalization she has stool studies that were negative for Cdiff.  Also, O&P, GI pathogen panel, and stool culture were negative.  Fecal lactoferrin was positive.  Stool culture did show abundant yeast and reduced normal flora.  She was placed on florastor probiotic by her PCP, which she is still taking.  Was on magnesium, but that has been stopped and diarrhea still continues.   Past Medical History  Diagnosis Date  . GERD (gastroesophageal reflux disease)     2000 taking otc prevacid   evey day.   . Hypertension      3 meds controlled  . Hyperlipidemia   . Kidney stone   . Asthma     as a child and then attack 1996  poss allergic to cat hair. ventilated x 2 . saw Dr [place] Callas then .   Marland Kitchen Atrial flutter     a. 12/2014 Echo: EF 60-65%, no rwma, mild MR;  b. S/P TEE/DCCV;  c. CHA2DS2VASc = 3-->Eliquis.   Past Surgical History  Procedure Laterality Date  . Cataract extraction  10/14/10    both eyes  . Shoulder surgery      frozen shoulder rt 2006, lf 2000 blackman.  Rhae Hammock without cardioversion N/A 01/03/2015    Procedure: TRANSESOPHAGEAL ECHOCARDIOGRAM (TEE);  Surgeon: Pricilla Riffle, MD;  Location: [facility]  ENDOSCOPY;  Service: Cardiovascular;  Laterality: N/A;  . Cardioversion N/A 01/03/2015    Procedure: CARDIOVERSION;  Surgeon: Pricilla Riffle, MD;  Location: [facility] ENDOSCOPY;  Service: Cardiovascular;  Laterality: N/A;    reports that she quit smoking about 28 years ago. She has never used smokeless tobacco. She reports that she does not drink alcohol or use illicit drugs. family history includes Alcohol abuse in her father; Breast cancer in her mother; Cancer in her mother; Uterine cancer in her sister. There is no history of Colon cancer, Heart attack, Stroke, or Hypertension. Allergies  Allergen Reactions  . Penicillins Other (See Comments)    Childhood        Outpatient Encounter Prescriptions as of 02/12/2015  Medication Sig  . apixaban (ELIQUIS) 5 MG TABS tablet Take 1 tablet (5 mg total) by mouth 2 (two) times daily.  . bisoprolol (ZEBETA) 5 MG tablet Take 1 tablet (5 mg total) by mouth 2 (two) times daily.  . cholecalciferol (VITAMIN D) 1000 UNITS tablet Take 1,000 Units by mouth daily.    . cloNIDine (CATAPRES) 0.1 MG tablet Take 1 tablet (0.1 mg total) by mouth daily.  . cyclobenzaprine (FLEXERIL) 5 MG tablet Take 1 tablet (5 mg total) by mouth 3 (three) times daily as needed for muscle spasms.  Marland Kitchen  fluticasone (FLOVENT HFA) 110 MCG/ACT inhaler Inhale 1 puff into the lungs 2 (two) times daily.  . hydrochlorothiazide (HYDRODIURIL) 25 MG tablet Take 25 mg by mouth daily.   Marland Kitchen ipratropium (ATROVENT HFA) 17 MCG/ACT inhaler Inhale 2 puffs into the lungs 2 (two) times daily.  . lansoprazole (PREVACID) 30 MG capsule Take 1 capsule (30 mg total) by mouth daily.  . montelukast (SINGULAIR) 10 MG tablet Take 1 tablet (10 mg total) by mouth every morning.  . Multiple Vitamins-Minerals (MULTIVITAMIN WITH MINERALS) tablet Take 1 tablet by mouth daily.    . quinapril (ACCUPRIL) 20 MG tablet Take 1 tablet (20 mg total) by mouth daily.  . rosuvastatin (CRESTOR) 10 MG tablet Take 1 tablet (10 mg total) by  mouth daily. 1/2 daily (Patient taking differently: Take 5 mg by mouth daily. )  . saccharomyces boulardii (FLORASTOR) 250 MG capsule Take 1 capsule (250 mg total) by mouth 2 (two) times daily.  . theophylline (THEODUR) 300 MG 12 hr tablet Take 300 mg by mouth daily.   . VENTOLIN HFA 108 (90 BASE) MCG/ACT inhaler Inhale 1-2 puffs into the lungs as needed for wheezing or shortness of breath.   . [DISCONTINUED] budesonide (PULMICORT) 0.25 MG/2ML nebulizer solution Take 2 mLs (0.25 mg total) by nebulization 2 (two) times daily. (Patient not taking: Reported on 01/29/2015)   No facility-administered encounter medications on file as of 02/12/2015.     REVIEW OF SYSTEMS  : All other systems reviewed and negative except where noted in the History of Present Illness.   PHYSICAL EXAM: BP 120/68 mmHg  Pulse 72  Ht 5\' 5"  (1.651 m)  Wt 183 lb 6.4 oz (83.19 kg)  BMI 30.52 kg/m2 General: Well developed white female in no acute distress Head: Normocephalic and atraumatic Eyes:  Sclerae anicteric, conjunctiva pink. Ears: Normal auditory acuity Lungs: Clear throughout to auscultation Heart: Regular rate and rhythm Abdomen: Soft, non-distended.  Normal-slightly hyperactive bowel sounds Musculoskeletal: Symmetrical with no gross deformities  Skin: No lesions on visible extremities Extremities: No edema  Neurological: Alert oriented x 4, grossly non-focal Psychological:  Alert and cooperative. Normal mood and affect  ASSESSMENT AND PLAN: -Diarrhea:  About considered chronic at this point with 3-4 months of symptoms.  May need colonoscopy with random biopsies to rule out microscopic colitis, but this may be tricky since she was just cardioverted and placed on Eliquis in July.  Will start by treating with flagyl 500 mg BID for 10 days and she will call back in 2-3 weeks to give [place] an update on her symptoms.      CC:  Panosh, Neta Mends, MD

## 2015-02-12 NOTE — Patient Instructions (Signed)
This is intertrigo. Often yeast involved on skin   Add antifungal cream Twice a day for 1-2 weeks    Also low potency  coritsone for itching     Keep dry as possible   Contact us if not getting better spreading fever  Intertrigo Intertrigo is a skin condition that occurs in between folds of skin in places on the body that rub together a lot and do not get much ventilation. It is caused by heat, moisture, friction, sweat retention, and lack of air circulation, which produces red, irritated patches and, sometimes, scaling or drainage. People who have diabetes, who are obese, or who have treatment with antibiotics are at increased risk for intertrigo. The most common sites for intertrigo to occur include:  The groin.  The breasts.  The armpits.  Folds of abdominal skin.  Webbed spaces between the fingers or toes. Intertrigo may be aggravated by:  Sweat.  Feces.  Yeast or bacteria that are present near skin folds.  Urine.  Vaginal discharge. HOME CARE INSTRUCTIONS  The following steps can be taken to reduce friction and keep the affected area cool and dry:  Expose skin folds to the air.  Keep deep skin folds separated with cotton or linen cloth. Avoid tight fitting clothing that could cause chafing.  Wear open-toed shoes or sandals to help reduce moisture between the toes.  Apply absorbent powders to affected areas as directed by your caregiver.  Apply over-the-counter barrier pastes, such as zinc oxide, as directed by your caregiver.  If you develop a fungal infection in the affected area, your caregiver may have you use antifungal creams. SEEK MEDICAL CARE IF:   The rash is not improving after 1 week of treatment.  The rash is getting worse (more red, more swollen, more painful, or spreading).  You have a fever or chills. MAKE SURE YOU:   Understand these instructions.  Will watch your condition.  Will get help right away if you are not doing well or get  worse. Document Released: 06/01/2005 Document Revised: 08/24/2011 Document Reviewed: 11/14/2009 [facility] Patient Information 2015 [place], [place]. This information is not intended to replace advice given to you by your health care provider. Make sure you discuss any questions you have with your health care provider.

## 2015-02-12 NOTE — Patient Instructions (Signed)
We have sent in Flagyl to your pharmacy Call back in 2-3 weeks ask for [name] to give an update on how well your doing Continue Florastor

## 2015-02-15 ENCOUNTER — Ambulatory Visit (INDEPENDENT_AMBULATORY_CARE_PROVIDER_SITE_OTHER): Payer: Medicare Other | Admitting: Internal Medicine

## 2015-02-15 ENCOUNTER — Encounter: Payer: Self-pay | Admitting: Internal Medicine

## 2015-02-15 VITALS — BP 132/82 | Temp 98.0°F | Ht 65.0 in | Wt 185.1 lb

## 2015-02-15 DIAGNOSIS — R8299 Other abnormal findings in urine: Secondary | ICD-10-CM | POA: Diagnosis not present

## 2015-02-15 DIAGNOSIS — R3989 Other symptoms and signs involving the genitourinary system: Secondary | ICD-10-CM | POA: Diagnosis not present

## 2015-02-15 DIAGNOSIS — R300 Dysuria: Secondary | ICD-10-CM

## 2015-02-15 LAB — POCT URINALYSIS DIPSTICK
Bilirubin, UA: NEGATIVE
Blood, UA: NEGATIVE
GLUCOSE UA: NEGATIVE
KETONES UA: NEGATIVE
Nitrite, UA: NEGATIVE
Protein, UA: NEGATIVE
SPEC GRAV UA: 1.02
UROBILINOGEN UA: 0.2
pH, UA: 6

## 2015-02-15 MED ORDER — NITROFURANTOIN MONOHYD MACRO 100 MG PO CAPS: 100.0000 mg | ORAL_CAPSULE | Freq: Two times a day (BID) | ORAL | Status: DC

## 2015-02-15 NOTE — Patient Instructions (Signed)
Plan culture test of your urine but this will take a couple days to get the results back. Urine has only a trace of inflammation cells. Sometimes she can get discoloration of the urine from medications and it is not serious.  However if you develop fever pain with urinations and illness call the on-call service and may need to start the antibody that I printed out for you.

## 2015-02-15 NOTE — Progress Notes (Signed)
Pre visit review using our clinic review tool, if applicable. No additional management support is needed unless otherwise documented below in the visit note.  Chief Complaint  Patient presents with  . Abnormal Urine Color    HPI: Patient Penny Barnett  comes in today for SDA for  new problem evaluation. Onset yesterday with dark urine.  Not normal for her .     Gets this when gets uti  She had in July . No fever rash is getting better as well as diarrhea  On metronidazole.   Coming into   holiday weekend .  No flank pain  ROS: See pertinent positives and negatives per HPI. No fever chills     Past Medical History  Diagnosis Date  . GERD (gastroesophageal reflux disease)     2000 taking otc prevacid   evey day.   . Hypertension      3 meds controlled  . Hyperlipidemia   . Kidney stone   . Asthma     as a child and then attack 1996  poss allergic to cat hair. ventilated x 2 . saw Dr [place] Callas then .   Marland Kitchen Atrial flutter     a. 12/2014 Echo: EF 60-65%, no rwma, mild MR;  b. S/P TEE/DCCV;  c. CHA2DS2VASc = 3-->Eliquis.    Family History  Problem Relation Age of Onset  . Breast cancer Mother   . Cancer Mother     bladder  . Uterine cancer Sister   . Colon cancer Neg Hx   . Alcohol abuse Father   . Heart attack Neg Hx   . Stroke Neg Hx   . Hypertension Neg Hx     Social History   Social History  . Marital Status: Single    Spouse Name: N/A  . Number of Children: N/A  . Years of Education: N/A   Occupational History  . CSR    Social History Main Topics  . Smoking status: Former Smoker    Quit date: 06/21/1986  . Smokeless tobacco: Never Used  . Alcohol Use: No  . Drug Use: No  . Sexual Activity: Not Asked   Other Topics Concern  . 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    HS education  single   6 hours of sleep   Hh   Of 1  Pet dog an horse.    Remote tobacco  1-2 ppd stopped 1988    Etoh couple times a month rare    ocass  Diet soda.                 Outpatient Prescriptions Prior to Visit  Medication Sig Dispense Refill  . apixaban (ELIQUIS) 5 MG TABS tablet Take 1 tablet (5 mg total) by mouth 2 (two) times daily. 180 tablet 0  . bisoprolol (ZEBETA) 5 MG tablet Take 1 tablet (5 mg total) by mouth 2 (two) times daily. 180 tablet 0  . cholecalciferol (VITAMIN D) 1000 UNITS tablet Take 1,000 Units by mouth daily.      . cloNIDine (CATAPRES) 0.1 MG tablet Take 1 tablet (0.1 mg total) by mouth daily. 180 tablet 3  . cyclobenzaprine (FLEXERIL) 5 MG tablet Take 1 tablet (5 mg total) by mouth 3 (three) times  daily as needed for muscle spasms. 30 tablet 2  . fluticasone (FLOVENT HFA) 110 MCG/ACT inhaler Inhale 1 puff into the lungs 2 (two) times daily. 3 Inhaler 3  . hydrochlorothiazide (HYDRODIURIL) 25 MG tablet Take 25 mg by mouth daily.     Marland Kitchen ipratropium (ATROVENT HFA) 17 MCG/ACT inhaler Inhale 2 puffs into the lungs 2 (two) times daily.    Marland Kitchen ketoconazole (NIZORAL) 2 % cream Apply 1 application topically 2 (two) times daily. As directed 30 g 1  . lansoprazole (PREVACID) 30 MG capsule Take 1 capsule (30 mg total) by mouth daily. 90 capsule 3  . metroNIDAZOLE (FLAGYL) 500 MG tablet Take 1 tablet (500 mg total) by mouth 2 (two) times daily. 20 tablet 0  . montelukast (SINGULAIR) 10 MG tablet Take 1 tablet (10 mg total) by mouth every morning. 90 tablet 3  . Multiple Vitamins-Minerals (MULTIVITAMIN WITH MINERALS) tablet Take 1 tablet by mouth daily.      . quinapril (ACCUPRIL) 20 MG tablet Take 1 tablet (20 mg total) by mouth daily. 90 tablet 3  . rosuvastatin (CRESTOR) 10 MG tablet Take 1 tablet (10 mg total) by mouth daily. 1/2 daily (Patient taking differently: Take 5 mg by mouth daily. ) 90 tablet 3  . saccharomyces boulardii (FLORASTOR) 250 MG capsule Take 1 capsule (250 mg total) by mouth 2 (two) times  daily. 60 capsule 0  . theophylline (THEODUR) 300 MG 12 hr tablet Take 300 mg by mouth daily.     Marland Kitchen triamcinolone (KENALOG) 0.025 % cream Apply 1 application topically 2 (two) times daily. 30 g 0  . VENTOLIN HFA 108 (90 BASE) MCG/ACT inhaler INHALE 2 PUFFS INTO THE LUNGS EVERY 6 (SIX) HOURS AS NEEDED FOR WHEEZING OR SHORTNESS OF BREATH. 54 g 0   No facility-administered medications prior to visit.     EXAM:  BP 132/82 mmHg  Temp(Src) 98 F (36.7 C) (Oral)  Ht 5\' 5"  (1.651 m)  Wt 185 lb 1.6 oz (83.961 kg)  BMI 30.80 kg/m2  Body mass index is 30.8 kg/(m^2).  GENERAL: vitals reviewed and listed above, alert, oriented, appears well hydrated and in no acute distress HEENT: atraumatic, conjunctiva  clear, no obvious abnormalities on inspection of external nose and ears NECK: no obvious masses on inspection palpation  Abdomen:  Sof,t normal bowel sounds without hepatosplenomegaly, no guarding rebound or masses no CVA tenderness MS: moves all extremities without noticeable focal  abnormality PSYCH: pleasant and cooperative, no obvious depression or anxiety ua trc leuk   Past urine  July e coli pan sensitive  ASSESSMENT AND PLAN:  Discussed the following assessment and plan:  Dysuria - Plan: POC Urinalysis Dipstick, Culture, Urine, CANCELED: POC Urinalysis Dipstick  Urine discoloration - Plan: Culture, Urine, CANCELED: POC Urinalysis Dipstick  -Patient advised to return or notify health care team  if symptoms worsen ,persist or new concerns arise.  Patient Instructions  Plan culture test of your urine but this will take a couple days to get the results back. Urine has only a trace of inflammation cells. Sometimes she can get discoloration of the urine from medications and it is not serious.  However if you develop fever pain with urinations and illness call the on-call service and may need to start the antibody that I printed out for you.   Penny Barnett. Penny Barnett M.D.

## 2015-02-17 LAB — URINE CULTURE

## 2015-02-19 ENCOUNTER — Telehealth: Payer: Self-pay | Admitting: Internal Medicine

## 2015-02-19 MED ORDER — TRIAMCINOLONE ACETONIDE 0.025 % EX CREA: 1.0000 "application " | TOPICAL_CREAM | Freq: Two times a day (BID) | CUTANEOUS | Status: DC

## 2015-02-19 NOTE — Telephone Encounter (Signed)
Patient Name: Penny Barnett  DOB: 30-Jan-1951    Initial Comment Caller states, saw the Dr last Tues for an itchy rash, was given a cream, now it is coming in other places. She thinks it may be a side effect to new Rx she was given in July.    Nurse Assessment  Nurse: Sherilyn Cooter, RN, Thurmond Butts Date/Time Lamount Cohen Time): 02/19/2015 10:22:11 AM  Confirm and document reason for call. If symptomatic, describe symptoms. ---Caller states that she was prescribed 2 new meds in July. She was seen last week for an itchy rash and prescribed a cream for it. She now has places coming up in other locations. It is itchy. She is about out of the cream. The rash is "not as angry" as last week, but she still has it coming back. She denies fever. She rates her itching as moderate, it has awakened her from sleep. The rash is on the inside of her thighs at this time. She describes the rash as flat and pinkish, not like the one last week.  Has the patient traveled out of the country within the last 30 days? ---No  Does the patient require triage? ---Yes  Related visit to physician within the last 2 weeks? ---Yes  Does the PT have any chronic conditions? (i.e. diabetes, asthma, etc.) ---Yes  List chronic conditions. ---Asthma, HTN, Hypercholesterolemia     Guidelines    Guideline Title Affirmed Question Affirmed Notes  Rash or Redness - Localized [1] Severe localized itching AND [2] after 2 days of steroid cream and antihistamines Triamcinalone, and Ketoconazole. New and different rash began Saturday   Final Disposition User   See PCP When Office is Open (within 3 days) Sherilyn Cooter, RN, Thurmond Butts    Comments  Appointment made for tomorrow morning at 8:30am with Dr. Gershon Crane. She would also like another tube of the Triamcinolone Acetonide 0.025% to be called in today please. She is out of this one, but she still has the Ketoconazole. Uses [facility] on [address] [phone].   Disagree/Comply: Comply

## 2015-02-19 NOTE — Telephone Encounter (Signed)
Ok to refill the tmc and the nizoral.

## 2015-02-19 NOTE — Telephone Encounter (Signed)
Ok to refill medication 

## 2015-02-19 NOTE — Telephone Encounter (Signed)
Pt notified to keep appointment with Dr. Clent Ridges and refill sent to the pharmacy.

## 2015-02-20 ENCOUNTER — Encounter: Payer: Self-pay | Admitting: Family Medicine

## 2015-02-20 ENCOUNTER — Ambulatory Visit (INDEPENDENT_AMBULATORY_CARE_PROVIDER_SITE_OTHER): Payer: Medicare Other | Admitting: Family Medicine

## 2015-02-20 VITALS — BP 127/76 | HR 67 | Temp 98.1°F | Ht 65.0 in | Wt 183.0 lb

## 2015-02-20 DIAGNOSIS — L309 Dermatitis, unspecified: Secondary | ICD-10-CM

## 2015-02-20 MED ORDER — BETAMETHASONE DIPROPIONATE 0.05 % EX CREA: TOPICAL_CREAM | Freq: Two times a day (BID) | CUTANEOUS | Status: DC

## 2015-02-20 NOTE — Progress Notes (Signed)
Reviewed and agree with management.  If need be can do colonoscopy with random biopsies while on anticoagulated Barbette Hair. Arlyce Dice, M.D., [facility]

## 2015-02-20 NOTE — Progress Notes (Signed)
Pre visit review using our clinic review tool, if applicable. No additional management support is needed unless otherwise documented below in the visit note. 

## 2015-02-20 NOTE — Progress Notes (Signed)
   Subjective:    Patient ID: Penny Barnett, female    DOB: [birth year], 64 y.o.   MRN: [id number]  HPI Here for several weeks of an itchy rash that started in the groin areas, but which has now spread to the elbows, chest, and abdomen. She has used Triamcinolone cream with mixed results. She wonders if this could be anallergic reaction to one of 2 new medications she was started on in July, Eliquis and bisoprolol. Also she was seen last week for dark urine and it was felt she may be having a UTI. She was given a rx for Macrobid but she has not filled this yet. She denies any UTI symptoms today, and in fact the culture from last week did not isolate any bacteria.    Review of Systems  Constitutional: Negative.   Genitourinary: Negative.   Skin: Positive for rash.       Objective:   Physical Exam  Constitutional: She appears well-developed and well-nourished.  Abdominal: Soft. Bowel sounds are normal. She exhibits no distension and no mass. There is no tenderness. There is no rebound and no guarding.  Skin:  Patches of dry, inflamed, scaly skin in the above locations          Assessment & Plan:  This is eczema, and she will try Diprolene cream bid. I reassured her that this is not a reaction to her new medications. She does not have a UTI. So she will not fill the rx for Macrobid.

## 2015-03-08 ENCOUNTER — Telehealth: Payer: Self-pay | Admitting: Gastroenterology

## 2015-03-08 NOTE — Telephone Encounter (Signed)
Spoke with patient and she is calling to let Doug Sou, PA know that she is doing well. No problems now.

## 2015-03-20 ENCOUNTER — Ambulatory Visit: Payer: Medicare Other | Admitting: Gastroenterology

## 2015-04-22 ENCOUNTER — Encounter: Payer: Self-pay | Admitting: Internal Medicine

## 2015-04-22 ENCOUNTER — Ambulatory Visit (INDEPENDENT_AMBULATORY_CARE_PROVIDER_SITE_OTHER): Payer: Medicare Other | Admitting: Internal Medicine

## 2015-04-22 VITALS — BP 122/78 | HR 52 | Ht 65.0 in | Wt 186.0 lb

## 2015-04-22 DIAGNOSIS — I4892 Unspecified atrial flutter: Secondary | ICD-10-CM

## 2015-04-22 DIAGNOSIS — E785 Hyperlipidemia, unspecified: Secondary | ICD-10-CM | POA: Diagnosis not present

## 2015-04-22 DIAGNOSIS — I10 Essential (primary) hypertension: Secondary | ICD-10-CM

## 2015-04-22 MED ORDER — APIXABAN 5 MG PO TABS: 5.0000 mg | ORAL_TABLET | Freq: Two times a day (BID) | ORAL | Status: DC

## 2015-04-22 NOTE — Progress Notes (Signed)
Cardiology Office Note   Date:  04/22/2015   ID:  Penny Barnett, DOB September 25, 1950, MRN [id number]  PCP:  Lorretta Harp, MD  Cardiologist:   Dietrich Pates, MD   No chief complaint on file.     History of Present Illness: Penny Barnett is a 64 y.o. female with a history of atrial flutter  She was seen this summer and underwent TEE cardioversion.  Placed on Eliquis. Since seen she has done well  She hsa never had signif palpitations.  Presented this summer with fatigue  Since cardioversion she denies this  She is active  Denies CP  No dizzienss     Current Outpatient Prescriptions  Medication Sig Dispense Refill  . apixaban (ELIQUIS) 5 MG TABS tablet Take 1 tablet (5 mg total) by mouth 2 (two) times daily. 180 tablet 0  . bisoprolol (ZEBETA) 5 MG tablet Take 1 tablet (5 mg total) by mouth 2 (two) times daily. 180 tablet 0  . cholecalciferol (VITAMIN D) 1000 UNITS tablet Take 1,000 Units by mouth daily.      . cloNIDine (CATAPRES) 0.1 MG tablet Take 1 tablet (0.1 mg total) by mouth daily. 180 tablet 3  . cyclobenzaprine (FLEXERIL) 5 MG tablet Take 1 tablet (5 mg total) by mouth 3 (three) times daily as needed for muscle spasms. 30 tablet 2  . fluticasone (FLOVENT HFA) 110 MCG/ACT inhaler Inhale 1 puff into the lungs 2 (two) times daily. 3 Inhaler 3  . hydrochlorothiazide (HYDRODIURIL) 25 MG tablet Take 25 mg by mouth daily.     Marland Kitchen ipratropium (ATROVENT HFA) 17 MCG/ACT inhaler Inhale 2 puffs into the lungs 2 (two) times daily.    . lansoprazole (PREVACID) 30 MG capsule Take 1 capsule (30 mg total) by mouth daily. 90 capsule 3  . montelukast (SINGULAIR) 10 MG tablet Take 1 tablet (10 mg total) by mouth every morning. 90 tablet 3  . Multiple Vitamins-Minerals (MULTIVITAMIN WITH MINERALS) tablet Take 1 tablet by mouth daily.      . quinapril (ACCUPRIL) 20 MG tablet Take 1 tablet (20 mg total) by mouth daily. 90 tablet 3  . rosuvastatin (CRESTOR) 10 MG tablet Take 5 mg by mouth  daily.    Marland Kitchen saccharomyces boulardii (FLORASTOR) 250 MG capsule Take 1 capsule (250 mg total) by mouth 2 (two) times daily. 60 capsule 0  . theophylline (THEODUR) 300 MG 12 hr tablet Take 300 mg by mouth daily.     . VENTOLIN HFA 108 (90 BASE) MCG/ACT inhaler INHALE 2 PUFFS INTO THE LUNGS EVERY 6 (SIX) HOURS AS NEEDED FOR WHEEZING OR SHORTNESS OF BREATH. 54 g 0   No current facility-administered medications for this visit.    Allergies:   Penicillins   Past Medical History  Diagnosis Date  . GERD (gastroesophageal reflux disease)     2000 taking otc prevacid   evey day.   . Hypertension      3 meds controlled  . Hyperlipidemia   . Kidney stone   . Asthma     as a child and then attack 1996  poss allergic to cat hair. ventilated x 2 . saw Dr [place] Callas then .   Marland Kitchen Atrial flutter (HCC)     a. 12/2014 Echo: EF 60-65%, no rwma, mild MR;  b. S/P TEE/DCCV;  c. CHA2DS2VASc = 3-->Eliquis.    Past Surgical History  Procedure Laterality Date  . Cataract extraction  10/14/10    both eyes  . Shoulder surgery  frozen shoulder rt 2006, lf 2000 blackman.  Rhae Hammock. Tee without cardioversion N/A 01/03/2015    Procedure: TRANSESOPHAGEAL ECHOCARDIOGRAM (TEE);  Surgeon: Pricilla RifflePaula Wrenly Lauritsen V, MD;  [facility];  Service: Cardiovascular;  Laterality: N/A;  . Cardioversion N/A 01/03/2015    Procedure: CARDIOVERSION;  Surgeon: Pricilla RifflePaula Kimi Bordeau V, MD;  [facility];  Service: Cardiovascular;  Laterality: N/A;     Social History:  The patient  reports that she quit smoking about 28 years ago. She has never used smokeless tobacco. She reports that she does not drink alcohol or use illicit drugs.   Family History:  The patient's family history includes Alcohol abuse in her father; Breast cancer in her mother; Cancer in her mother; Uterine cancer in her sister. There is no history of Colon cancer, Heart attack, Stroke, or Hypertension.    ROS:  Please see the history of present illness. All other systems are  reviewed and  Negative to the above problem except as noted.    PHYSICAL EXAM: VS:  BP 122/78 mmHg  Pulse 52  Ht 5\' 5"  (1.651 m)  Wt 186 lb (84.369 kg)  BMI 30.95 kg/m2  SpO2 97%  GEN: Well nourished, well developed, in no acute distress HEENT: normal Neck: no JVD, carotid bruits, or masses Cardiac: RRR; no murmurs, rubs, or gallops,no edema  Respiratory:  clear to auscultation bilaterally, normal work of breathing GI: soft, nontender, nondistended, + BS  No hepatomegaly  MS: no deformity Moving all extremities   Skin: warm and dry, no rash Neuro:  Strength and sensation are intact Psych: euthymic mood, full affect   EKG:  EKG is not ordered today.   Lipid Panel    Component Value Date/Time   CHOL 174 10/16/2014 0902   TRIG 107.0 10/16/2014 0902   HDL 57.30 10/16/2014 0902   CHOLHDL 3 10/16/2014 0902   VLDL 21.4 10/16/2014 0902   LDLCALC 95 10/16/2014 0902   LDLDIRECT 159.2 10/07/2012 0813      Wt Readings from Last 3 Encounters:  04/22/15 186 lb (84.369 kg)  02/20/15 183 lb (83.008 kg)  02/15/15 185 lb 1.6 oz (83.961 kg)      ASSESSMENT AND PLAN:  1.  Atrial flutter  Pt denies fatigue like she had when found to be in atrial flutter. I would keep on anticoagulation and follow     2.  HL COntinue statin  3.  Achy hips.  Pt claims had on other statins  Now started when she started eliquis I am not sure related.  WOuld stop statin  Call in 2 wks with rsponse  If no change I will review with pharmacy likelihood that Eliquis is involved. Continue for now    3.  HTN  Adequate control  4.  Asthma  Moving air well     Signed, Dietrich PatesPaula Allison Deshotels, MD  04/22/2015 8:40 AM    [facility] [address], [place], [place]  [address] Phone: [phone]) [phone]; Fax: [phone]

## 2015-04-22 NOTE — Patient Instructions (Signed)
Your physician recommends that you continue on your current medications as directed. Please refer to the Current Medication list given to you today. --per Dr. Tenny Crawoss, hold crestor for a couple weeks to see if your achiness improves.  Please call office with update.--  Your physician wants you to follow-up in: 6 months with Dr. Tenny Crawoss.  You will receive a reminder letter in the mail two months in advance. If you don't receive a letter, please call our office to schedule the follow-up appointment.

## 2015-04-26 ENCOUNTER — Other Ambulatory Visit: Payer: Self-pay | Admitting: Internal Medicine

## 2015-05-03 ENCOUNTER — Other Ambulatory Visit: Payer: Self-pay | Admitting: Internal Medicine

## 2015-05-07 NOTE — Telephone Encounter (Signed)
Spoke to pt, told her Rx refill for Ventolin inhaler sent to pharmacy. Message from Dr. Fabian SharpPanosh that if you are having to use weekly or more often need to schedule follow up to discuss asthma management. Pt verbalized understanding.

## 2015-05-07 NOTE — Telephone Encounter (Signed)
Can refill x 2   . If having to use weekly then plan fu visit to discuss asthma management

## 2015-09-09 ENCOUNTER — Encounter: Payer: Self-pay | Admitting: Internal Medicine

## 2015-09-09 DIAGNOSIS — M25569 Pain in unspecified knee: Secondary | ICD-10-CM

## 2015-09-11 NOTE — Telephone Encounter (Signed)
reviewed note Tell her  I agree about getting an ortho appt .  Please refer ,  And let her know.

## 2015-09-30 ENCOUNTER — Encounter: Payer: Self-pay | Admitting: Internal Medicine

## 2015-10-02 ENCOUNTER — Other Ambulatory Visit: Payer: Self-pay

## 2015-10-02 DIAGNOSIS — Z1231 Encounter for screening mammogram for malignant neoplasm of breast: Secondary | ICD-10-CM

## 2015-10-21 ENCOUNTER — Ambulatory Visit
Admission: RE | Admit: 2015-10-21 | Discharge: 2015-10-21 | Disposition: A | Discharge status: Home / Self Care | Payer: Medicare Other | Source: Ambulatory Visit

## 2015-10-21 DIAGNOSIS — Z1231 Encounter for screening mammogram for malignant neoplasm of breast: Secondary | ICD-10-CM

## 2015-10-24 NOTE — Progress Notes (Signed)
Chief Complaint  Patient presents with  . Medicare Wellness    HPI: Penny Barnett 65 y.o. comes in today for Preventive Medicare wellness visit . And also Chronic disease management Resp:  On theodur, singulair flovent running out and atrovent hfa,   usig rescue 1-2 x per day night when she goes to br and sometime in day  Uncertain triggers or if needed a smuch BP  low 98 over 60 at tiems  Asks about that some fatigue no syncope. ses dr Harrington Challenger for the af and placed on eliquis and zebeta last year when in hospital       on accupril hctz but ony taking every other day because uncertain she needs it  And clonidine off and on about 3 x perweek  once a day causes drowsiness.  These were some meds she had been given previously  GI gets hb if goes off prevacid  LIPIDS:  Only taking crestor 5 qod instead of qd   Health Maintenance  Topic Date Due  . ZOSTAVAX  12/06/2008  . PNA vac Low Risk Adult (2 of 2 - PPSV23) 10/23/2015  . INFLUENZA VACCINE  01/14/2016  . MAMMOGRAM  10/20/2017  . TETANUS/TDAP  06/15/2018  . COLONOSCOPY  03/12/2021  . DEXA SCAN  Completed  . Hepatitis C Screening  Completed   Health Maintenance Review LIFESTYLE:  TAD no Sugar beverages: not a lot  Sleep:  6-7      MEDICARE DOCUMENT QUESTIONS  TO SCAN     Hearing: ok  Vision:  No limitations at present . Last eye check UTD  Safety:  Has smoke detector and wears seat belts.  No firearms. No excess sun exposure. Sees dentist regularly.  Falls: n  Advance directive :  Reviewed  Doesn't have one   Memory: Felt to be good  , no concern from her or her family.  Depression: No anhedonia unusual crying or depressive symptoms  Nutrition: Eats well balanced diet; adequate calcium and vitamin D. No swallowing chewing problems.  Injury: no major injuries in the last six months.  Other healthcare providers:  Reviewed today .  Social:  Lives alone . Cares for horses  2 dogs   Preventive parameters:  up-to-date  Reviewed   ADLS:   There are no problems or need for assistance  driving, feeding, obtaining food, dressing, toileting and bathing, managing money using phone. She is independent.   ROS:  GEN/ HEENT: No fever, significant weight changes sweats headaches vision problems hearing changes, CV/ PULM; No chest pain shortness of breath cough, syncope,edema  change in exercise tolerance.see above hx  GI /GU: No adominal pain, vomiting, change in bowel habits. No blood in the stool. No significant GU symptoms. SKIN/HEME: ,no acute skin rashes suspicious lesions or bleeding. No lymphadenopathy, nodules, masses.  NEURO/ PSYCH:  No neurologic signs such as weakness numbness. No depression anxiety. IMM/ Allergy: No unusual infections.  Allergy .   REST of 12 system review negative except as per HPI   Past Medical History  Diagnosis Date  . GERD (gastroesophageal reflux disease)     2000 taking otc prevacid   evey day.   . Hypertension      3 meds controlled  . Hyperlipidemia   . Kidney stone   . Asthma     as a child and then attack 1996  poss allergic to cat hair. ventilated x 2 . saw Dr Donneta Romberg then .   Marland Kitchen Atrial flutter ([place])  a. 12/2014 Echo: EF 60-65%, no rwma, mild MR;  b. S/P TEE/DCCV;  c. CHA2DS2VASc = 3-->Eliquis.    Family History  Problem Relation Age of Onset  . Breast cancer Mother   . Cancer Mother     bladder  . Uterine cancer Sister   . Colon cancer Neg Hx   . Heart attack Neg Hx   . Stroke Neg Hx   . Hypertension Neg Hx   . Alcohol abuse Father     Social History   Social History  . Marital Status: Single    Spouse Name: N/A  . Number of Children: N/A  . Years of Education: N/A   Occupational History  . CSR    Social History Main Topics  . Smoking status: Former Smoker    Quit date: 06/21/1986  . Smokeless tobacco: Never Used  . Alcohol Use: No  . Drug Use: No  . Sexual Activity: Not Asked   Other Topics Concern  . 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    HS education  single   6 hours of sleep   Hh  Of 1  Pet dog an horse.    Remote tobacco  1-2 ppd stopped 1988    Etoh couple times a month rare    ocass  Diet soda.                 Outpatient Encounter Prescriptions as of 10/25/2015  Medication Sig  . apixaban (ELIQUIS) 5 MG TABS tablet Take 1 tablet (5 mg total) by mouth 2 (two) times daily.  . bisoprolol (ZEBETA) 5 MG tablet TAKE 1 TABLET (5 MG TOTAL) BY MOUTH 2 (TWO) TIMES DAILY.  . cholecalciferol (VITAMIN D) 1000 UNITS tablet Take 1,000 Units by mouth daily.    . cyclobenzaprine (FLEXERIL) 5 MG tablet Take 1 tablet (5 mg total) by mouth 3 (three) times daily as needed for muscle spasms.  . fluticasone (FLOVENT HFA) 110 MCG/ACT inhaler Inhale 1 puff into the lungs 2 (two) times daily.  . hydrochlorothiazide (HYDRODIURIL) 25 MG tablet Take 25 mg by mouth daily. Told to decrease to 1/2 once a day at this visit   . ipratropium (ATROVENT HFA) 17 MCG/ACT inhaler Inhale 2 puffs into the lungs 2 (two) times daily.  . lansoprazole (PREVACID) 30 MG capsule Take 1 capsule (30 mg total) by mouth daily.  . montelukast (SINGULAIR) 10 MG tablet Take 1 tablet (10 mg total) by mouth every morning.  . Multiple Vitamins-Minerals (MULTIVITAMIN WITH MINERALS) tablet Take 1 tablet by mouth daily.    . quinapril (ACCUPRIL) 20 MG tablet Take 1 tablet (20 mg total) by mouth daily.  . rosuvastatin (CRESTOR) 10 MG tablet Take 5 mg by mouth daily. Taking every other day to avoid side effects   . saccharomyces boulardii (FLORASTOR) 250 MG capsule Take 1 capsule (250 mg total) by mouth 2 (two) times daily.  . theophylline (THEODUR) 300 MG 12 hr tablet Take 300 mg by mouth daily.   . VENTOLIN HFA 108 (90 BASE) MCG/ACT inhaler INHALE 2 PUFFS INTO THE LUNGS EVERY 6 (SIX) HOURS AS NEEDED FOR WHEEZING OR  SHORTNESS OF BREATH.  . [DISCONTINUED] cloNIDine (CATAPRES) 0.1 MG tablet Take 1 tablet (0.1 mg total) by mouth  daily.  . [DISCONTINUED] fluticasone (FLOVENT HFA) 110 MCG/ACT inhaler Inhale 1 puff into the lungs 2 (two) times daily.   No facility-administered encounter medications on file as of 10/25/2015.    EXAM:  BP 124/72 mmHg  Pulse 55  Temp(Src) 98 F (36.7 C) (Oral)  Ht 5' 4" (1.626 m)  Wt 188 lb 4 oz (85.39 kg)  BMI 32.30 kg/m2  SpO2 98%  Body mass index is 32.3 kg/(m^2).  Physical Exam: Vital signs reviewed MWN:UUVO is a well-developed well-nourished alert cooperative   who appears stated age in no acute distress.  HEENT: normocephalic atraumatic , Eyes: PERRL EOM's full, conjunctiva clear, Nares: paten,t no deformity discharge or tenderness., Ears: no deformity EAC's clear TMs with normal landmarks. Mouth: clear OP, no lesions, edema.  Moist mucous membranes. Dentition in adequate repair. NECK: supple without masses, thyromegaly or bruits. CHEST/PULM:  Clear to auscultation and percussion breath sounds equal no wheeze , rales or rhonchi. No chest wall deformities or tenderness. CV: PMI is nondisplaced, S1 S2 no gallops, murmurs, rubs.rate 60  Peripheral pulses arewithout delay.No JVD . Breast: normal by inspection . No dimpling, discharge, masses, tenderness or discharge . ABDOMEN: Bowel sounds normal nontender  No guard or rebound, no hepato splenomegal no CVA tenderness.   Extremtities:  No clubbing cyanosis or edema, no acute joint swelling or redness no focal atrophy NEURO:  Oriented x3, cranial nerves 3-12 appear to be intact, no obvious focal weakness,gait within normal limits no abnormal reflexes or asymmetrical SKIN: No acute rashes normal turgor, color, no bruising or petechiae. PSYCH: Oriented, good eye contact, no obvious depression anxiety, cognition and judgment appear normal. LN: no cervical axillary inguinal adenopathy No noted deficits in memory, attention,  and speech.    BP Readings from Last 3 Encounters:  10/25/15 124/72  04/22/15 122/78  02/20/15 127/76    ASSESSMENT AND PLAN:  Discussed the following assessment and plan:  Medicare annual wellness visit, subsequent  Essential hypertension - Plan: Basic metabolic panel, CBC with Differential/Platelet, Hepatic function panel, Lipid panel, TSH  Hyperlipidemia - Plan: Basic metabolic panel, CBC with Differential/Platelet, Hepatic function panel, Lipid panel, TSH  Medication management - Plan: Basic metabolic panel, CBC with Differential/Platelet, Hepatic function panel, Lipid panel, TSH  Asthma, moderate persistent, uncomplicated - Plan: Basic metabolic panel, CBC with Differential/Platelet, Hepatic function panel, Lipid panel, TSH, Pulmonary function test  Gastroesophageal reflux disease, esophagitis presence not specified - Plan: Basic metabolic panel, CBC with Differential/Platelet, Hepatic function panel, Lipid panel, TSH  Need for prophylactic vaccination against Streptococcus pneumoniae (pneumococcus) - Plan: Pneumococcal polysaccharide vaccine 23-valent greater than or equal to 2yo subcutaneous/IM  Chronic anticoagulation - Plan: Basic metabolic panel, CBC with Differential/Platelet, Hepatic function panel, Lipid panel, TSH Advance didrectives ho given  reviewed today Fraser Din asks about ia with b blocker and clonidineshe read on internet  Disc  Not that concerning as she  Should stop the clonidine anyway.  Patient Care Team: Burnis Medin, MD as PCP - General (Internal Medicine) Inda Castle, MD (Gastroenterology) Allyn Kenner, MD (Dermatology)  Patient Instructions  Because of your low BP readings .   Stop the clonidine     And then take the hctz 1/2 pill per day ( not 1 every other day)   If  Your bp readings are concen send in message my chart or contact   us or Dr Harrington Challenger office for advice.  Will refill flovent and if  A less expensive alternative in your plan let us  know   This.  You will be notified about  Getting pulmonary lung function tests  And then we can decide on   Possible  Consult with  pulmonary if continued  Increase need for albuterol .   Health Maintenance, Female Adopting a healthy lifestyle and getting preventive care can go a long way to promote health and wellness. Talk with your health care provider about what schedule of regular examinations is right for you. This is a good chance for you to check in with your provider about disease prevention and staying healthy. In between checkups, there are plenty of things you can do on your own. Experts have done a lot of research about which lifestyle changes and preventive measures are most likely to keep you healthy. Ask your health care provider for more information. WEIGHT AND DIET  Eat a healthy diet  Be sure to include plenty of vegetables, fruits, low-fat dairy products, and lean protein.  Do not eat a lot of foods high in solid fats, added sugars, or salt.  Get regular exercise. This is one of the most important things you can do for your health.  Most adults should exercise for at least 150 minutes each week. The exercise should increase your heart rate and make you sweat (moderate-intensity exercise).  Most adults should also do strengthening exercises at least twice a week. This is in addition to the moderate-intensity exercise.  Maintain a healthy weight  Body mass index (BMI) is a measurement that can be used to identify possible weight problems. It estimates body fat based on height and weight. Your health care provider can help determine your BMI and help you achieve or maintain a healthy weight.  For females 40 years of age and older:   A BMI below 18.5 is considered underweight.  A BMI of 18.5 to 24.9 is normal.  A BMI of 25 to 29.9 is considered overweight.  A BMI of 30 and above is considered obese.  Watch levels of cholesterol and blood lipids  You should start having  your blood tested for lipids and cholesterol at 67 years of age, then have this test every 5 years.  You may need to have your cholesterol levels checked more often if:  Your lipid or cholesterol levels are high.  You are older than 67 years of age.  You are at high risk for heart disease.  CANCER SCREENING   Lung Cancer  Lung cancer screening is recommended for adults 71-49 years old who are at high risk for lung cancer because of a history of smoking.  A yearly low-dose CT scan of the lungs is recommended for people who:  Currently smoke.  Have quit within the past 15 years.  Have at least a 30-pack-year history of smoking. A pack year is smoking an average of one pack of cigarettes a day for 1 year.  Yearly screening should continue until it has been 15 years since you quit.  Yearly screening should stop if you develop a health problem that would prevent you from having lung cancer treatment.  Breast Cancer  Practice breast self-awareness. This means understanding how your breasts normally appear and feel.  It also means doing regular breast self-exams. Let your health care provider know about any changes, no matter how small.  If you are in your 20s or 30s, you should have a clinical breast exam (CBE) by a health care provider every 1-3 years as part of a regular health exam.  If  you are 40 or older, have a CBE every year. Also consider having a breast X-ray (mammogram) every year.  If you have a family history of breast cancer, talk to your health care provider about genetic screening.  If you are at high risk for breast cancer, talk to your health care provider about having an MRI and a mammogram every year.  Breast cancer gene (BRCA) assessment is recommended for women who have family members with BRCA-related cancers. BRCA-related cancers include:  Breast.  Ovarian.  Tubal.  Peritoneal cancers.  Results of the assessment will determine the need for genetic  counseling and BRCA1 and BRCA2 testing. Cervical Cancer Your health care provider may recommend that you be screened regularly for cancer of the pelvic organs (ovaries, uterus, and vagina). This screening involves a pelvic examination, including checking for microscopic changes to the surface of your cervix (Pap test). You may be encouraged to have this screening done every 3 years, beginning at age 13.  For women ages 32-65, health care providers may recommend pelvic exams and Pap testing every 3 years, or they may recommend the Pap and pelvic exam, combined with testing for human papilloma virus (HPV), every 5 years. Some types of HPV increase your risk of cervical cancer. Testing for HPV may also be done on women of any age with unclear Pap test results.  Other health care providers may not recommend any screening for nonpregnant women who are considered low risk for pelvic cancer and who do not have symptoms. Ask your health care provider if a screening pelvic exam is right for you.  If you have had past treatment for cervical cancer or a condition that could lead to cancer, you need Pap tests and screening for cancer for at least 20 years after your treatment. If Pap tests have been discontinued, your risk factors (such as having a new sexual partner) need to be reassessed to determine if screening should resume. Some women have medical problems that increase the chance of getting cervical cancer. In these cases, your health care provider may recommend more frequent screening and Pap tests. Colorectal Cancer  This type of cancer can be detected and often prevented.  Routine colorectal cancer screening usually begins at 67 years of age and continues through 67 years of age.  Your health care provider may recommend screening at an earlier age if you have risk factors for colon cancer.  Your health care provider may also recommend using home test kits to check for hidden blood in the stool.  A  small camera at the end of a tube can be used to examine your colon directly (sigmoidoscopy or colonoscopy). This is done to check for the earliest forms of colorectal cancer.  Routine screening usually begins at age 75.  Direct examination of the colon should be repeated every 5-10 years through 67 years of age. However, you may need to be screened more often if early forms of precancerous polyps or small growths are found. Skin Cancer  Check your skin from head to toe regularly.  Tell your health care provider about any new moles or changes in moles, especially if there is a change in a mole's shape or color.  Also tell your health care provider if you have a mole that is larger than the size of a pencil eraser.  Always use sunscreen. Apply sunscreen liberally and repeatedly throughout the day.  Protect yourself by wearing long sleeves, pants, a wide-brimmed hat, and sunglasses whenever you are  outside. HEART DISEASE, DIABETES, AND HIGH BLOOD PRESSURE   High blood pressure causes heart disease and increases the risk of stroke. High blood pressure is more likely to develop in:  People who have blood pressure in the high end of the normal range (130-139/85-89 mm Hg).  People who are overweight or obese.  People who are African American.  If you are 15-59 years of age, have your blood pressure checked every 3-5 years. If you are 11 years of age or older, have your blood pressure checked every year. You should have your blood pressure measured twice--once when you are at a hospital or clinic, and once when you are not at a hospital or clinic. Record the average of the two measurements. To check your blood pressure when you are not at a hospital or clinic, you can use:  An automated blood pressure machine at a pharmacy.  A home blood pressure monitor.  If you are between 57 years and 75 years old, ask your health care provider if you should take aspirin to prevent strokes.  Have  regular diabetes screenings. This involves taking a blood sample to check your fasting blood sugar level.  If you are at a normal weight and have a low risk for diabetes, have this test once every three years after 67 years of age.  If you are overweight and have a high risk for diabetes, consider being tested at a younger age or more often. PREVENTING INFECTION  Hepatitis B  If you have a higher risk for hepatitis B, you should be screened for this virus. You are considered at high risk for hepatitis B if:  You were born in a country where hepatitis B is common. Ask your health care provider which countries are considered high risk.  Your parents were born in a high-risk country, and you have not been immunized against hepatitis B (hepatitis B vaccine).  You have HIV or AIDS.  You use needles to inject street drugs.  You live with someone who has hepatitis B.  You have had sex with someone who has hepatitis B.  You get hemodialysis treatment.  You take certain medicines for conditions, including cancer, organ transplantation, and autoimmune conditions. Hepatitis C  Blood testing is recommended for:  Everyone born from [birth year] through 1967.  Anyone with known risk factors for hepatitis C. Sexually transmitted infections (STIs)  You should be screened for sexually transmitted infections (STIs) including gonorrhea and chlamydia if:  You are sexually active and are younger than 67 years of age.  You are older than 67 years of age and your health care provider tells you that you are at risk for this type of infection.  Your sexual activity has changed since you were last screened and you are at an increased risk for chlamydia or gonorrhea. Ask your health care provider if you are at risk.  If you do not have HIV, but are at risk, it may be recommended that you take a prescription medicine daily to prevent HIV infection. This is called pre-exposure prophylaxis (PrEP). You are  considered at risk if:  You are sexually active and do not regularly use condoms or know the HIV status of your partner(s).  You take drugs by injection.  You are sexually active with a partner who has HIV. Talk with your health care provider about whether you are at high risk of being infected with HIV. If you choose to begin PrEP, you should first be tested for HIV.  You should then be tested every 3 months for as long as you are taking PrEP.  PREGNANCY   If you are premenopausal and you may become pregnant, ask your health care provider about preconception counseling.  If you may become pregnant, take 400 to 800 micrograms (mcg) of folic acid every day.  If you want to prevent pregnancy, talk to your health care provider about birth control (contraception). OSTEOPOROSIS AND MENOPAUSE   Osteoporosis is a disease in which the bones lose minerals and strength with aging. This can result in serious bone fractures. Your risk for osteoporosis can be identified using a bone density scan.  If you are 35 years of age or older, or if you are at risk for osteoporosis and fractures, ask your health care provider if you should be screened.  Ask your health care provider whether you should take a calcium or vitamin D supplement to lower your risk for osteoporosis.  Menopause may have certain physical symptoms and risks.  Hormone replacement therapy may reduce some of these symptoms and risks. Talk to your health care provider about whether hormone replacement therapy is right for you.  HOME CARE INSTRUCTIONS   Schedule regular health, dental, and eye exams.  Stay current with your immunizations.   Do not use any tobacco products including cigarettes, chewing tobacco, or electronic cigarettes.  If you are pregnant, do not drink alcohol.  If you are breastfeeding, limit how much and how often you drink alcohol.  Limit alcohol intake to no more than 1 drink per day for nonpregnant women. One  drink equals 12 ounces of beer, 5 ounces of wine, or 1 ounces of hard liquor.  Do not use street drugs.  Do not share needles.  Ask your health care provider for help if you need support or information about quitting drugs.  Tell your health care provider if you often feel depressed.  Tell your health care provider if you have ever been abused or do not feel safe at home.   This information is not intended to replace advice given to you by your health care provider. Make sure you discuss any questions you have with your health care provider.   Document Released: 12/15/2010 Document Revised: 06/22/2014 Document Reviewed: 05/03/2013 Elsevier Interactive Patient Education 2016 [place]. Decklyn Hornik M.D.

## 2015-10-25 ENCOUNTER — Ambulatory Visit (INDEPENDENT_AMBULATORY_CARE_PROVIDER_SITE_OTHER): Payer: Medicare Other | Admitting: Internal Medicine

## 2015-10-25 ENCOUNTER — Encounter: Payer: Self-pay | Admitting: Internal Medicine

## 2015-10-25 VITALS — BP 124/72 | HR 55 | Temp 98.0°F | Ht 64.0 in | Wt 188.2 lb

## 2015-10-25 DIAGNOSIS — I10 Essential (primary) hypertension: Secondary | ICD-10-CM

## 2015-10-25 DIAGNOSIS — E785 Hyperlipidemia, unspecified: Secondary | ICD-10-CM

## 2015-10-25 DIAGNOSIS — Z0000 Encounter for general adult medical examination without abnormal findings: Secondary | ICD-10-CM | POA: Diagnosis not present

## 2015-10-25 DIAGNOSIS — Z23 Encounter for immunization: Secondary | ICD-10-CM

## 2015-10-25 DIAGNOSIS — K219 Gastro-esophageal reflux disease without esophagitis: Secondary | ICD-10-CM

## 2015-10-25 DIAGNOSIS — Z79899 Other long term (current) drug therapy: Secondary | ICD-10-CM | POA: Diagnosis not present

## 2015-10-25 DIAGNOSIS — Z7901 Long term (current) use of anticoagulants: Secondary | ICD-10-CM

## 2015-10-25 DIAGNOSIS — J4540 Moderate persistent asthma, uncomplicated: Secondary | ICD-10-CM | POA: Diagnosis not present

## 2015-10-25 LAB — CBC WITH DIFFERENTIAL/PLATELET
BASOS PCT: 0.5 % (ref 0.0–3.0)
Basophils Absolute: 0 10*3/uL (ref 0.0–0.1)
EOS PCT: 5 % (ref 0.0–5.0)
Eosinophils Absolute: 0.3 10*3/uL (ref 0.0–0.7)
HCT: 35.6 % — ABNORMAL LOW (ref 36.0–46.0)
Hemoglobin: 11.8 g/dL — ABNORMAL LOW (ref 12.0–15.0)
LYMPHS ABS: 1.4 10*3/uL (ref 0.7–4.0)
Lymphocytes Relative: 20.8 % (ref 12.0–46.0)
MCHC: 33.2 g/dL (ref 30.0–36.0)
MCV: 79.9 fl (ref 78.0–100.0)
MONO ABS: 0.5 10*3/uL (ref 0.1–1.0)
Monocytes Relative: 7.5 % (ref 3.0–12.0)
NEUTROS ABS: 4.5 10*3/uL (ref 1.4–7.7)
Neutrophils Relative %: 66.2 % (ref 43.0–77.0)
Platelets: 209 10*3/uL (ref 150.0–400.0)
RBC: 4.46 Mil/uL (ref 3.87–5.11)
RDW: 15.6 % — AB (ref 11.5–15.5)
WBC: 6.7 10*3/uL (ref 4.0–10.5)

## 2015-10-25 LAB — BASIC METABOLIC PANEL
BUN: 21 mg/dL (ref 6–23)
CHLORIDE: 99 meq/L (ref 96–112)
CO2: 30 meq/L (ref 19–32)
Calcium: 9.6 mg/dL (ref 8.4–10.5)
Creatinine, Ser: 0.69 mg/dL (ref 0.40–1.20)
GFR: 90.23 mL/min (ref >60.00)
GLUCOSE: 89 mg/dL (ref 70–99)
POTASSIUM: 3.2 meq/L — AB (ref 3.5–5.1)
Sodium: 139 mEq/L (ref 135–145)

## 2015-10-25 LAB — HEPATIC FUNCTION PANEL
ALT: 26 U/L (ref 0–35)
AST: 22 U/L (ref 0–37)
Albumin: 4.3 g/dL (ref 3.5–5.2)
Alkaline Phosphatase: 71 U/L (ref 39–117)
BILIRUBIN DIRECT: 0 mg/dL (ref 0.0–0.3)
Total Bilirubin: 0.4 mg/dL (ref 0.2–1.2)
Total Protein: 7.2 g/dL (ref 6.0–8.3)

## 2015-10-25 LAB — LIPID PANEL
Cholesterol: 177 mg/dL (ref 0–200)
HDL: 50.8 mg/dL (ref >39.00)
LDL Cholesterol: 97 mg/dL (ref 0–99)
NonHDL: 126.42
TRIGLYCERIDES: 147 mg/dL (ref 0.0–149.0)
Total CHOL/HDL Ratio: 3
VLDL: 29.4 mg/dL (ref 0.0–40.0)

## 2015-10-25 LAB — TSH: TSH: 1.22 u[IU]/mL (ref 0.35–4.50)

## 2015-10-25 MED ORDER — FLUTICASONE PROPIONATE HFA 110 MCG/ACT IN AERO: 1.0000 | INHALATION_SPRAY | Freq: Two times a day (BID) | RESPIRATORY_TRACT | Status: DC

## 2015-10-25 NOTE — Patient Instructions (Addendum)
Because of your low BP readings .   Stop the clonidine     And then take the hctz 1/2 pill per day ( not 1 every other day)   If  Your bp readings are concen send in message my chart or contact   us or Dr Harrington Challenger office for advice.  Will refill flovent and if  A less expensive alternative in your plan let us know  This.  You will be notified about  Getting pulmonary lung function tests  And then we can decide on   Possible  Consult with  pulmonary if continued  Increase need for albuterol .   Health Maintenance, Female Adopting a healthy lifestyle and getting preventive care can go a long way to promote health and wellness. Talk with your health care provider about what schedule of regular examinations is right for you. This is a good chance for you to check in with your provider about disease prevention and staying healthy. In between checkups, there are plenty of things you can do on your own. Experts have done a lot of research about which lifestyle changes and preventive measures are most likely to keep you healthy. Ask your health care provider for more information. WEIGHT AND DIET  Eat a healthy diet  Be sure to include plenty of vegetables, fruits, low-fat dairy products, and lean protein.  Do not eat a lot of foods high in solid fats, added sugars, or salt.  Get regular exercise. This is one of the most important things you can do for your health.  Most adults should exercise for at least 150 minutes each week. The exercise should increase your heart rate and make you sweat (moderate-intensity exercise).  Most adults should also do strengthening exercises at least twice a week. This is in addition to the moderate-intensity exercise.  Maintain a healthy weight  Body mass index (BMI) is a measurement that can be used to identify possible weight problems. It estimates body fat based on height and weight. Your health care provider can help determine your BMI and help you achieve or  maintain a healthy weight.  For females 24 years of age and older:   A BMI below 18.5 is considered underweight.  A BMI of 18.5 to 24.9 is normal.  A BMI of 25 to 29.9 is considered overweight.  A BMI of 30 and above is considered obese.  Watch levels of cholesterol and blood lipids  You should start having your blood tested for lipids and cholesterol at 67 years of age, then have this test every 5 years.  You may need to have your cholesterol levels checked more often if:  Your lipid or cholesterol levels are high.  You are older than 67 years of age.  You are at high risk for heart disease.  CANCER SCREENING   Lung Cancer  Lung cancer screening is recommended for adults 57-34 years old who are at high risk for lung cancer because of a history of smoking.  A yearly low-dose CT scan of the lungs is recommended for people who:  Currently smoke.  Have quit within the past 15 years.  Have at least a 30-pack-year history of smoking. A pack year is smoking an average of one pack of cigarettes a day for 1 year.  Yearly screening should continue until it has been 15 years since you quit.  Yearly screening should stop if you develop a health problem that would prevent you from having lung cancer treatment.  Breast Cancer  Practice breast self-awareness. This means understanding how your breasts normally appear and feel.  It also means doing regular breast self-exams. Let your health care provider know about any changes, no matter how small.  If you are in your 20s or 30s, you should have a clinical breast exam (CBE) by a health care provider every 1-3 years as part of a regular health exam.  If you are 69 or older, have a CBE every year. Also consider having a breast X-ray (mammogram) every year.  If you have a family history of breast cancer, talk to your health care provider about genetic screening.  If you are at high risk for breast cancer, talk to your health care  provider about having an MRI and a mammogram every year.  Breast cancer gene (BRCA) assessment is recommended for women who have family members with BRCA-related cancers. BRCA-related cancers include:  Breast.  Ovarian.  Tubal.  Peritoneal cancers.  Results of the assessment will determine the need for genetic counseling and BRCA1 and BRCA2 testing. Cervical Cancer Your health care provider may recommend that you be screened regularly for cancer of the pelvic organs (ovaries, uterus, and vagina). This screening involves a pelvic examination, including checking for microscopic changes to the surface of your cervix (Pap test). You may be encouraged to have this screening done every 3 years, beginning at age 73.  For women ages 29-65, health care providers may recommend pelvic exams and Pap testing every 3 years, or they may recommend the Pap and pelvic exam, combined with testing for human papilloma virus (HPV), every 5 years. Some types of HPV increase your risk of cervical cancer. Testing for HPV may also be done on women of any age with unclear Pap test results.  Other health care providers may not recommend any screening for nonpregnant women who are considered low risk for pelvic cancer and who do not have symptoms. Ask your health care provider if a screening pelvic exam is right for you.  If you have had past treatment for cervical cancer or a condition that could lead to cancer, you need Pap tests and screening for cancer for at least 20 years after your treatment. If Pap tests have been discontinued, your risk factors (such as having a new sexual partner) need to be reassessed to determine if screening should resume. Some women have medical problems that increase the chance of getting cervical cancer. In these cases, your health care provider may recommend more frequent screening and Pap tests. Colorectal Cancer  This type of cancer can be detected and often prevented.  Routine  colorectal cancer screening usually begins at 67 years of age and continues through 67 years of age.  Your health care provider may recommend screening at an earlier age if you have risk factors for colon cancer.  Your health care provider may also recommend using home test kits to check for hidden blood in the stool.  A small camera at the end of a tube can be used to examine your colon directly (sigmoidoscopy or colonoscopy). This is done to check for the earliest forms of colorectal cancer.  Routine screening usually begins at age 37.  Direct examination of the colon should be repeated every 5-10 years through 67 years of age. However, you may need to be screened more often if early forms of precancerous polyps or small growths are found. Skin Cancer  Check your skin from head to toe regularly.  Tell your health care  provider about any new moles or changes in moles, especially if there is a change in a mole's shape or color.  Also tell your health care provider if you have a mole that is larger than the size of a pencil eraser.  Always use sunscreen. Apply sunscreen liberally and repeatedly throughout the day.  Protect yourself by wearing long sleeves, pants, a wide-brimmed hat, and sunglasses whenever you are outside. HEART DISEASE, DIABETES, AND HIGH BLOOD PRESSURE   High blood pressure causes heart disease and increases the risk of stroke. High blood pressure is more likely to develop in:  People who have blood pressure in the high end of the normal range (130-139/85-89 mm Hg).  People who are overweight or obese.  People who are African American.  If you are 73-14 years of age, have your blood pressure checked every 3-5 years. If you are 72 years of age or older, have your blood pressure checked every year. You should have your blood pressure measured twice--once when you are at a hospital or clinic, and once when you are not at a hospital or clinic. Record the average of the  two measurements. To check your blood pressure when you are not at a hospital or clinic, you can use:  An automated blood pressure machine at a pharmacy.  A home blood pressure monitor.  If you are between 20 years and 60 years old, ask your health care provider if you should take aspirin to prevent strokes.  Have regular diabetes screenings. This involves taking a blood sample to check your fasting blood sugar level.  If you are at a normal weight and have a low risk for diabetes, have this test once every three years after 67 years of age.  If you are overweight and have a high risk for diabetes, consider being tested at a younger age or more often. PREVENTING INFECTION  Hepatitis B  If you have a higher risk for hepatitis B, you should be screened for this virus. You are considered at high risk for hepatitis B if:  You were born in a country where hepatitis B is common. Ask your health care provider which countries are considered high risk.  Your parents were born in a high-risk country, and you have not been immunized against hepatitis B (hepatitis B vaccine).  You have HIV or AIDS.  You use needles to inject street drugs.  You live with someone who has hepatitis B.  You have had sex with someone who has hepatitis B.  You get hemodialysis treatment.  You take certain medicines for conditions, including cancer, organ transplantation, and autoimmune conditions. Hepatitis C  Blood testing is recommended for:  Everyone born from [birth year] through 1967.  Anyone with known risk factors for hepatitis C. Sexually transmitted infections (STIs)  You should be screened for sexually transmitted infections (STIs) including gonorrhea and chlamydia if:  You are sexually active and are younger than 67 years of age.  You are older than 67 years of age and your health care provider tells you that you are at risk for this type of infection.  Your sexual activity has changed since you were  last screened and you are at an increased risk for chlamydia or gonorrhea. Ask your health care provider if you are at risk.  If you do not have HIV, but are at risk, it may be recommended that you take a prescription medicine daily to prevent HIV infection. This is called pre-exposure prophylaxis (PrEP). You are  considered at risk if:  You are sexually active and do not regularly use condoms or know the HIV status of your partner(s).  You take drugs by injection.  You are sexually active with a partner who has HIV. Talk with your health care provider about whether you are at high risk of being infected with HIV. If you choose to begin PrEP, you should first be tested for HIV. You should then be tested every 3 months for as long as you are taking PrEP.  PREGNANCY   If you are premenopausal and you may become pregnant, ask your health care provider about preconception counseling.  If you may become pregnant, take 400 to 800 micrograms (mcg) of folic acid every day.  If you want to prevent pregnancy, talk to your health care provider about birth control (contraception). OSTEOPOROSIS AND MENOPAUSE   Osteoporosis is a disease in which the bones lose minerals and strength with aging. This can result in serious bone fractures. Your risk for osteoporosis can be identified using a bone density scan.  If you are 41 years of age or older, or if you are at risk for osteoporosis and fractures, ask your health care provider if you should be screened.  Ask your health care provider whether you should take a calcium or vitamin D supplement to lower your risk for osteoporosis.  Menopause may have certain physical symptoms and risks.  Hormone replacement therapy may reduce some of these symptoms and risks. Talk to your health care provider about whether hormone replacement therapy is right for you.  HOME CARE INSTRUCTIONS   Schedule regular health, dental, and eye exams.  Stay current with your  immunizations.   Do not use any tobacco products including cigarettes, chewing tobacco, or electronic cigarettes.  If you are pregnant, do not drink alcohol.  If you are breastfeeding, limit how much and how often you drink alcohol.  Limit alcohol intake to no more than 1 drink per day for nonpregnant women. One drink equals 12 ounces of beer, 5 ounces of wine, or 1 ounces of hard liquor.  Do not use street drugs.  Do not share needles.  Ask your health care provider for help if you need support or information about quitting drugs.  Tell your health care provider if you often feel depressed.  Tell your health care provider if you have ever been abused or do not feel safe at home.   This information is not intended to replace advice given to you by your health care provider. Make sure you discuss any questions you have with your health care provider.   Document Released: 12/15/2010 Document Revised: 06/22/2014 Document Reviewed: 05/03/2013 Elsevier Interactive Patient Education [employer].

## 2015-10-25 NOTE — Assessment & Plan Note (Signed)
Only taking 5 mg qod crestor

## 2015-10-25 NOTE — Assessment & Plan Note (Signed)
[place] bre[place]  Get pfts consider pulm consult is break through

## 2015-10-25 NOTE — Assessment & Plan Note (Signed)
  Because of low BP readings .   Stop the clonidine     And then take the hctz 1/2 pill per day ( not 1 every other day)   If  Your bp readings are concen send in message my chart or contact   [place] or Dr Tenny Crawoss office for advice.

## 2015-10-25 NOTE — Progress Notes (Signed)
Pre visit review using our clinic review tool, if applicable. No additional management support is needed unless otherwise documented below in the visit note. 

## 2015-10-28 ENCOUNTER — Ambulatory Visit (INDEPENDENT_AMBULATORY_CARE_PROVIDER_SITE_OTHER): Payer: Medicare Other | Admitting: Internal Medicine

## 2015-10-28 ENCOUNTER — Other Ambulatory Visit: Payer: Self-pay | Admitting: Family Medicine

## 2015-10-28 ENCOUNTER — Encounter: Payer: Self-pay | Admitting: Internal Medicine

## 2015-10-28 VITALS — BP 102/65 | HR 56 | Ht 64.0 in | Wt 185.4 lb

## 2015-10-28 DIAGNOSIS — D649 Anemia, unspecified: Secondary | ICD-10-CM

## 2015-10-28 DIAGNOSIS — I10 Essential (primary) hypertension: Secondary | ICD-10-CM | POA: Diagnosis not present

## 2015-10-28 DIAGNOSIS — E876 Hypokalemia: Secondary | ICD-10-CM

## 2015-10-28 MED ORDER — POTASSIUM CHLORIDE CRYS ER 20 MEQ PO TBCR: 20.0000 meq | EXTENDED_RELEASE_TABLET | Freq: Every day | ORAL | Status: DC

## 2015-10-28 NOTE — Patient Instructions (Signed)
Your physician recommends that you continue on your current medications as directed. Please refer to the Current Medication list given to you today. Your physician wants you to follow-up in: January, 2018 WITH DR. Tenny CrawOSS.  You will receive a reminder letter in the mail two months in advance. If you don't receive a letter, please call our office to schedule the follow-up appointment.

## 2015-10-28 NOTE — Progress Notes (Signed)
Cardiology Office Note   Date:  10/28/2015   ID:  Penny Barnett, MRN [id number]  PCP:  Lorretta Harp, MD  Cardiologist:   Dietrich Pates, MD   F/U of Atrial flutter      History of Present Illness: Penny Barnett is a 65 y.o. female with a history of atrial flutter  I saw her in NOvember  Had had TEE cardioversion last summer   Since then she has had no signif palpitations   [name] is OK  She was seen by Coral Ceo  Complained of weak feeling  Clonidine was dropped from regimen   Feeling better   More energy     Outpatient Prescriptions Prior to Visit  Medication Sig Dispense Refill  . apixaban (ELIQUIS) 5 MG TABS tablet Take 1 tablet (5 mg total) by mouth 2 (two) times daily. 180 tablet 3  . bisoprolol (ZEBETA) 5 MG tablet TAKE 1 TABLET (5 MG TOTAL) BY MOUTH 2 (TWO) TIMES DAILY. 180 tablet 3  . cholecalciferol (VITAMIN D) 1000 UNITS tablet Take 1,000 Units by mouth daily.      . cyclobenzaprine (FLEXERIL) 5 MG tablet Take 1 tablet (5 mg total) by mouth 3 (three) times daily as needed for muscle spasms. 30 tablet 2  . fluticasone (FLOVENT HFA) 110 MCG/ACT inhaler Inhale 1 puff into the lungs 2 (two) times daily. 3 Inhaler 3  . hydrochlorothiazide (HYDRODIURIL) 25 MG tablet Take 12.5 mg by mouth daily. Told to decrease to 1/2 once a day at this visit    . ipratropium (ATROVENT HFA) 17 MCG/ACT inhaler Inhale 2 puffs into the lungs 2 (two) times daily.    . lansoprazole (PREVACID) 30 MG capsule Take 1 capsule (30 mg total) by mouth daily. 90 capsule 3  . montelukast (SINGULAIR) 10 MG tablet Take 1 tablet (10 mg total) by mouth every morning. 90 tablet 3  . Multiple Vitamins-Minerals (MULTIVITAMIN WITH MINERALS) tablet Take 1 tablet by mouth daily.      . quinapril (ACCUPRIL) 20 MG tablet Take 1 tablet (20 mg total) by mouth daily. 90 tablet 3  . rosuvastatin (CRESTOR) 10 MG tablet Take 5 mg by mouth daily. Taking every other day to avoid side effects     .  saccharomyces boulardii (FLORASTOR) 250 MG capsule Take 1 capsule (250 mg total) by mouth 2 (two) times daily. 60 capsule 0  . theophylline (THEODUR) 300 MG 12 hr tablet Take 300 mg by mouth daily.     . VENTOLIN HFA 108 (90 BASE) MCG/ACT inhaler INHALE 2 PUFFS INTO THE LUNGS EVERY 6 (SIX) HOURS AS NEEDED FOR WHEEZING OR SHORTNESS OF BREATH. 54 g 1   No facility-administered medications prior to visit.     Allergies:   Penicillins   Past Medical History  Diagnosis Date  . GERD (gastroesophageal reflux disease)     2000 taking otc prevacid   evey day.   . Hypertension      3 meds controlled  . Hyperlipidemia   . Kidney stone   . Asthma     as a child and then attack 1996  poss allergic to cat hair. ventilated x 2 . saw Dr [place] Callas then .   Marland Kitchen Atrial flutter (HCC)     a. 12/2014 Echo: EF 60-65%, no rwma, mild MR;  b. S/P TEE/DCCV;  c. CHA2DS2VASc = 3-->Eliquis.    Past Surgical History  Procedure Laterality Date  . Cataract extraction  10/14/10    both eyes  .  Shoulder surgery      frozen shoulder rt 2006, lf 2000 blackman.  Rhae Hammock. Tee without cardioversion N/A 01/03/2015    Procedure: TRANSESOPHAGEAL ECHOCARDIOGRAM (TEE);  Surgeon: Pricilla RifflePaula Dayn Barich V, MD;  [facility];  Service: Cardiovascular;  Laterality: N/A;  . Cardioversion N/A 01/03/2015    Procedure: CARDIOVERSION;  Surgeon: Pricilla RifflePaula Ciarra Braddy V, MD;  [facility];  Service: Cardiovascular;  Laterality: N/A;     Social History:  The patient  reports that she quit smoking about 29 years ago. She has never used smokeless tobacco. She reports that she does not drink alcohol or use illicit drugs.   Family History:  The patient's family history includes Alcohol abuse in her father; Breast cancer in her mother; Cancer in her mother; Uterine cancer in her sister. There is no history of Colon cancer, Heart attack, Stroke, or Hypertension.    ROS:  Please see the history of present illness. All other systems are reviewed and   Negative to the above problem except as noted.    PHYSICAL EXAM: VS:  BP 102/65 mmHg  Pulse 56  Ht 5\' 4"  (1.626 m)  Wt 185 lb 6.4 oz (84.097 kg)  BMI 31.81 kg/m2  GEN: Well nourished, well developed, in no acute distress HEENT: normal Neck: no JVD, carotid bruits, or masses Cardiac: RRR; no murmurs, rubs, or gallops,no edema  Respiratory:  clear to auscultation bilaterally, normal work of breathing GI: soft, nontender, nondistended, + BS  No hepatomegaly  MS: no deformity Moving all extremities   Skin: warm and dry, no rash Neuro:  Strength and sensation are intact Psych: euthymic mood, full affect   EKG:  EKG is not ordered today.   Lipid Panel    Component Value Date/Time   CHOL 177 10/25/2015 0947   TRIG 147.0 10/25/2015 0947   HDL 50.80 10/25/2015 0947   CHOLHDL 3 10/25/2015 0947   VLDL 29.4 10/25/2015 0947   LDLCALC 97 10/25/2015 0947   LDLDIRECT 159.2 10/07/2012 0813      Wt Readings from Last 3 Encounters:  10/28/15 185 lb 6.4 oz (84.097 kg)  10/25/15 188 lb 4 oz (85.39 kg)  04/22/15 186 lb (84.369 kg)      ASSESSMENT AND PLAN:   1  Atrial flutter  No symtpoms  Never had palp   Will check cost of xarelto and pradaxa 2  HTN  Feeling better off of clonidine  Follow BP  It is 102 today   May need to drop back furhter    3  Lipids are good  4.  Hypokalemia  Will get K checked per Coral CeoW [place]        Signed, Dietrich PatesPaula Axzel [place], MD  10/28/2015 10:46 AM    [facility] [address], [place], [place]  [address] Phone: [phone]; Fax: [phone]

## 2015-10-29 ENCOUNTER — Encounter: Payer: Self-pay | Admitting: Gastroenterology

## 2015-11-04 ENCOUNTER — Encounter: Payer: Self-pay | Admitting: Family Medicine

## 2015-11-29 ENCOUNTER — Other Ambulatory Visit (INDEPENDENT_AMBULATORY_CARE_PROVIDER_SITE_OTHER): Payer: Medicare Other

## 2015-11-29 DIAGNOSIS — E876 Hypokalemia: Secondary | ICD-10-CM

## 2015-11-29 DIAGNOSIS — D649 Anemia, unspecified: Secondary | ICD-10-CM | POA: Diagnosis not present

## 2015-11-29 LAB — BASIC METABOLIC PANEL
BUN: 23 mg/dL (ref 6–23)
CALCIUM: 9.5 mg/dL (ref 8.4–10.5)
CO2: 26 mEq/L (ref 19–32)
CREATININE: 0.98 mg/dL (ref 0.40–1.20)
Chloride: 103 mEq/L (ref 96–112)
GFR: 60.17 mL/min (ref >60.00)
Glucose, Bld: 72 mg/dL (ref 70–99)
Potassium: 4 mEq/L (ref 3.5–5.1)
SODIUM: 138 meq/L (ref 135–145)

## 2015-11-29 LAB — CBC WITH DIFFERENTIAL/PLATELET
BASOS ABS: 0 10*3/uL (ref 0.0–0.1)
Basophils Relative: 0.6 % (ref 0.0–3.0)
EOS ABS: 0.2 10*3/uL (ref 0.0–0.7)
Eosinophils Relative: 2.2 % (ref 0.0–5.0)
HCT: 34.5 % — ABNORMAL LOW (ref 36.0–46.0)
HEMOGLOBIN: 11.5 g/dL — AB (ref 12.0–15.0)
LYMPHS PCT: 19.5 % (ref 12.0–46.0)
Lymphs Abs: 1.4 10*3/uL (ref 0.7–4.0)
MCHC: 33.4 g/dL (ref 30.0–36.0)
MCV: 79.2 fl (ref 78.0–100.0)
MONOS PCT: 7.7 % (ref 3.0–12.0)
Monocytes Absolute: 0.6 10*3/uL (ref 0.1–1.0)
NEUTROS ABS: 5 10*3/uL (ref 1.4–7.7)
NEUTROS PCT: 70 % (ref 43.0–77.0)
Platelets: 228 10*3/uL (ref 150.0–400.0)
RBC: 4.35 Mil/uL (ref 3.87–5.11)
RDW: 15.2 % (ref 11.5–15.5)
WBC: 7.2 10*3/uL (ref 4.0–10.5)

## 2015-11-29 LAB — MAGNESIUM: Magnesium: 1.9 mg/dL (ref 1.5–2.5)

## 2015-11-29 LAB — POC URINALSYSI DIPSTICK (AUTOMATED)
BILIRUBIN UA: NEGATIVE
Glucose, UA: NEGATIVE
NITRITE UA: NEGATIVE
PH UA: 5
PROTEIN UA: NEGATIVE
Spec Grav, UA: 1.02
Urobilinogen, UA: 0.2

## 2015-11-29 LAB — FERRITIN: FERRITIN: 10.3 ng/mL (ref 10.0–291.0)

## 2015-11-29 LAB — IBC PANEL
Iron: 71 ug/dL (ref 42–145)
Saturation Ratios: 15 % — ABNORMAL LOW (ref 20.0–50.0)
Transferrin: 338 mg/dL (ref 212.0–360.0)

## 2015-12-05 ENCOUNTER — Encounter: Payer: Self-pay | Admitting: Internal Medicine

## 2015-12-09 ENCOUNTER — Other Ambulatory Visit: Payer: Self-pay | Admitting: Family Medicine

## 2015-12-09 DIAGNOSIS — R8290 Unspecified abnormal findings in urine: Secondary | ICD-10-CM

## 2015-12-09 DIAGNOSIS — Z1211 Encounter for screening for malignant neoplasm of colon: Secondary | ICD-10-CM

## 2015-12-16 ENCOUNTER — Other Ambulatory Visit: Payer: Self-pay | Admitting: Internal Medicine

## 2015-12-18 NOTE — Telephone Encounter (Signed)
Sent in

## 2015-12-24 ENCOUNTER — Encounter: Payer: Self-pay | Admitting: Internal Medicine

## 2015-12-26 NOTE — Telephone Encounter (Signed)
You can wait until the diarrhea is better  Hopefully soon

## 2015-12-31 ENCOUNTER — Other Ambulatory Visit (INDEPENDENT_AMBULATORY_CARE_PROVIDER_SITE_OTHER): Payer: Medicare Other

## 2015-12-31 DIAGNOSIS — R8290 Unspecified abnormal findings in urine: Secondary | ICD-10-CM | POA: Diagnosis not present

## 2015-12-31 LAB — URINALYSIS, ROUTINE W REFLEX MICROSCOPIC
Bilirubin Urine: NEGATIVE
KETONES UR: NEGATIVE
NITRITE: NEGATIVE
Specific Gravity, Urine: 1.02 (ref 1.000–1.030)
Total Protein, Urine: NEGATIVE
URINE GLUCOSE: NEGATIVE
Urobilinogen, UA: 0.2 (ref 0.0–1.0)
pH: 5.5 (ref 5.0–8.0)

## 2016-01-04 ENCOUNTER — Encounter: Payer: Self-pay | Admitting: Internal Medicine

## 2016-01-07 NOTE — Telephone Encounter (Signed)
Please see result note  Sorry late reply  Needs clean catch collection

## 2016-01-09 ENCOUNTER — Other Ambulatory Visit: Payer: Self-pay | Admitting: Family Medicine

## 2016-01-09 DIAGNOSIS — R8290 Unspecified abnormal findings in urine: Secondary | ICD-10-CM

## 2016-01-14 ENCOUNTER — Other Ambulatory Visit: Payer: Self-pay | Admitting: Family Medicine

## 2016-01-14 ENCOUNTER — Other Ambulatory Visit (INDEPENDENT_AMBULATORY_CARE_PROVIDER_SITE_OTHER): Payer: Medicare Other

## 2016-01-14 DIAGNOSIS — R8290 Unspecified abnormal findings in urine: Secondary | ICD-10-CM | POA: Diagnosis not present

## 2016-01-14 LAB — URINALYSIS, MICROSCOPIC ONLY

## 2016-01-14 LAB — POC URINALSYSI DIPSTICK (AUTOMATED)
Bilirubin, UA: NEGATIVE
GLUCOSE UA: NEGATIVE
Ketones, UA: NEGATIVE
Leukocytes, UA: NEGATIVE
NITRITE UA: NEGATIVE
PROTEIN UA: NEGATIVE
Spec Grav, UA: 1.02
UROBILINOGEN UA: 0.2
pH, UA: 5.5

## 2016-01-14 NOTE — Progress Notes (Signed)
Pre visit review using our clinic review tool, if applicable. No additional management support is needed unless otherwise documented below in the visit note.  Chief Complaint  Patient presents with  . Diarrhea  . Nausea    HPI: Penny Barnett 65 y.o.  Comes  in for sda appt   Because of ongoing recurrent  diarrhea .  diarreha" back"  JUne  When went to [place] for weeks vacation   And For got to take   Probiotics   .   Not working even though  Back for a month and still happens.   no other   Exposures .  Well water other  Flagyl worked .  After a few days.  And so colon not done looking for microscopic colitis   Sx are Post pranadial  stooling and urgency ,  Runny  Not water .   No blood . About   5 - 6 per day .   Not at night  But urgency issues  No weight loss or vomiting .  No pain and fever.   no antibiotic   ROS: See pertinent positives and negatives per HPI. See prev visit diarrhea last fall with gi rx with probiotics and flagyl.  Just sent in stool tests for blood check also  No new meds   Past Medical History:  Diagnosis Date  . Asthma    as a child and then attack 1996  poss allergic to cat hair. ventilated x 2 . saw Dr [place] Callas then .   Marland Kitchen Atrial flutter (HCC)    a. 12/2014 Echo: EF 60-65%, no rwma, mild MR;  b. S/P TEE/DCCV;  c. CHA2DS2VASc = 3-->Eliquis.  Marland Kitchen GERD (gastroesophageal reflux disease)    2000 taking otc prevacid   evey day.   . Hyperlipidemia   . Hypertension     3 meds controlled  . Kidney stone     Family History  Problem Relation Age of Onset  . Breast cancer Mother   . Cancer Mother     bladder  . Uterine cancer Sister   . Colon cancer Neg Hx   . Heart attack Neg Hx   . Stroke Neg Hx   . Hypertension Neg Hx   . Alcohol abuse Father     Social History   Social History  . Marital status: Single    Spouse name: N/A  . Number of children: N/A  . Years of education: N/A   Occupational History  . CSR    Social History Main Topics    . Smoking status: Former Smoker    Quit date: 06/21/1986  . Smokeless tobacco: Never Used  . Alcohol use No  . Drug use: No  . Sexual activity: Not Asked   Other Topics Concern  . 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    HS education  single   6 hours of sleep   Hh  Of 1  Pet dog an horse.    Remote tobacco  1-2 ppd stopped 1988    Etoh couple times a month rare    ocass  Diet soda.                 Outpatient Medications Prior to Visit  Medication  Sig Dispense Refill  . apixaban (ELIQUIS) 5 MG TABS tablet Take 1 tablet (5 mg total) by mouth 2 (two) times daily. 180 tablet 3  . bisoprolol (ZEBETA) 5 MG tablet TAKE 1 TABLET (5 MG TOTAL) BY MOUTH 2 (TWO) TIMES DAILY. 180 tablet 3  . cholecalciferol (VITAMIN D) 1000 UNITS tablet Take 1,000 Units by mouth daily.      . cyclobenzaprine (FLEXERIL) 5 MG tablet Take 1 tablet (5 mg total) by mouth 3 (three) times daily as needed for muscle spasms. 30 tablet 2  . fluticasone (FLOVENT HFA) 110 MCG/ACT inhaler Inhale 1 puff into the lungs 2 (two) times daily. 3 Inhaler 3  . hydrochlorothiazide (HYDRODIURIL) 25 MG tablet TAKE 1 TABLET EVERY DAY 90 tablet 3  . ipratropium (ATROVENT HFA) 17 MCG/ACT inhaler Inhale 2 puffs into the lungs 2 (two) times daily.    . lansoprazole (PREVACID) 30 MG capsule Take 1 capsule (30 mg total) by mouth daily. 90 capsule 3  . montelukast (SINGULAIR) 10 MG tablet TAKE 1 TABLET (10 MG TOTAL) BY MOUTH EVERY MORNING. 90 tablet 3  . Multiple Vitamins-Minerals (MULTIVITAMIN WITH MINERALS) tablet Take 1 tablet by mouth daily.      . potassium chloride SA (K-DUR,KLOR-CON) 20 MEQ tablet Take 1 tablet (20 mEq total) by mouth daily. 30 tablet 3  . quinapril (ACCUPRIL) 20 MG tablet TAKE 1 TABLET EVERY DAY 90 tablet 3  . rosuvastatin (CRESTOR) 10 MG tablet Take 5 mg by mouth daily.  Taking every other day to avoid side effects     . saccharomyces boulardii (FLORASTOR) 250 MG capsule Take 1 capsule (250 mg total) by mouth 2 (two) times daily. 60 capsule 0  . theophylline (THEODUR) 300 MG 12 hr tablet Take 300 mg by mouth daily.     . VENTOLIN HFA 108 (90 Base) MCG/ACT inhaler INHALE 2 PUFFS INTO THE LUNGS EVERY 6 HOURS AS NEEDED FOR WHEEZING OR SHORTNESS OF BREATH. 54 g 1   No facility-administered medications prior to visit.      EXAM:  BP 120/82 (BP Location: Right Arm, Patient Position: Sitting, Cuff Size: Normal)   Temp 98 F (36.7 C) (Oral)   Ht 5\' 4"  (1.626 m)   Wt 184 lb 9.6 oz (83.7 kg)   BMI 31.69 kg/m   Body mass index is 31.69 kg/m.  GENERAL: vitals reviewed and listed above, alert, oriented, appears well hydrated and in no acute distress HEENT: atraumatic, conjunctiva  clear, no obvious abnormalities on inspection of external nose and ears  NECK: no obvious masses on inspection palpation  LUNGS: clear to auscultation bilaterally, no wheezes, rales or rhonchi, good air movement CV: HRRR, no clubbing cyanosis or  peripheral edema nl cap refill  Abdomen:  Sof,t normal bowel sounds without hepatosplenomegaly, no guarding rebound or masses no CVA tenderness Skin: normal capillary refill ,turgor , color: No acute rashes ,petechiae or bruising MS: moves all extremities without noticeable focal  abnormality PSYCH: pleasant and cooperative, no obvious depression or anxiety  Urinalysis    Component Value Date/Time   COLORURINE YELLOW 12/31/2015 0906   APPEARANCEUR Sl Cloudy (A) 12/31/2015 0906   LABSPEC 1.020 12/31/2015 0906   PHURINE 5.5 12/31/2015 0906   GLUCOSEU NEGATIVE 12/31/2015 0906   HGBUR SMALL (A) 12/31/2015 0906   BILIRUBINUR n 01/14/2016 1028   KETONESUR NEGATIVE 12/31/2015 0906   PROTEINUR n 01/14/2016 1028   PROTEINUR 100 (A) 12/30/2014 1900   UROBILINOGEN 0.2 01/14/2016 1028   UROBILINOGEN 0.2 12/31/2015 0906  NITRITE n 01/14/2016  1028   NITRITE NEGATIVE 12/31/2015 0906   LEUKOCYTESUR Negative 01/14/2016 1028     Neg  Micro x hyaline casts  ASSESSMENT AND PLAN:  Discussed the following assessment and plan:  Diarrhea, unspecified type - post prandial hx of response tio flagyl pt feels the same no acute findings or alarm sx  no blood re rx falgyl and if cont plan see GI  Chronic anticoagulation ua  cx stool pending for blood  Fu depending on  Results   After all better may need to repeat   Cbc? do celiac   May need to see gi if that is  Not resolved  -Patient advised to return or notify health care team  if symptoms worsen ,persist or new concerns arise.  Patient Instructions  Stay on your probiotic   Add metronidazole as discussed .  If not resolving then we will get you back with Gi evaluation.       Neta Mends. Panosh M.D.  ASSESSMENT AND PLAN: -Diarrhea:  About considered chronic at this point with 3-4 months of symptoms.  May need colonoscopy with random biopsies to rule out microscopic colitis, but this may be tricky since she was just cardioverted and placed on Eliquis in July.  Will start by treating with flagyl 500 mg BID for 10 days and she will call back in 2-3 weeks to give [place] an update on her symptoms.      CC:  Panosh, Neta Mends, MD       Instructions   We have sent in Flagyl to your pharmacy Call back in 2-3 weeks ask for [name] to give an update on how well your doing Continue Florastor      After Visit Summary (Printed 02/12/2015)

## 2016-01-15 ENCOUNTER — Ambulatory Visit (INDEPENDENT_AMBULATORY_CARE_PROVIDER_SITE_OTHER): Payer: Medicare Other | Admitting: Internal Medicine

## 2016-01-15 ENCOUNTER — Encounter: Payer: Self-pay | Admitting: Internal Medicine

## 2016-01-15 VITALS — BP 120/82 | Temp 98.0°F | Ht 64.0 in | Wt 184.6 lb

## 2016-01-15 DIAGNOSIS — Z7901 Long term (current) use of anticoagulants: Secondary | ICD-10-CM

## 2016-01-15 DIAGNOSIS — R197 Diarrhea, unspecified: Secondary | ICD-10-CM | POA: Diagnosis not present

## 2016-01-15 MED ORDER — METRONIDAZOLE 500 MG PO TABS: 500.0000 mg | ORAL_TABLET | Freq: Two times a day (BID) | ORAL | 0 refills | Status: DC

## 2016-01-15 NOTE — Patient Instructions (Addendum)
Stay on your probiotic   Add metronidazole as discussed .  If not resolving then we will get you back with Gi evaluation.

## 2016-01-16 LAB — URINE CULTURE: Organism ID, Bacteria: 10000

## 2016-01-20 ENCOUNTER — Other Ambulatory Visit (INDEPENDENT_AMBULATORY_CARE_PROVIDER_SITE_OTHER): Payer: Medicare Other

## 2016-01-20 DIAGNOSIS — Z1211 Encounter for screening for malignant neoplasm of colon: Secondary | ICD-10-CM

## 2016-01-20 LAB — POC HEMOCCULT BLD/STL (HOME/3-CARD/SCREEN)
Card #2 Fecal Occult Blod, POC: NEGATIVE
FECAL OCCULT BLD: NEGATIVE
FECAL OCCULT BLD: NEGATIVE

## 2016-01-27 ENCOUNTER — Encounter: Payer: Self-pay | Admitting: Internal Medicine

## 2016-01-27 DIAGNOSIS — R197 Diarrhea, unspecified: Secondary | ICD-10-CM

## 2016-02-04 ENCOUNTER — Encounter: Payer: Self-pay | Admitting: Internal Medicine

## 2016-02-06 NOTE — Telephone Encounter (Signed)
Please get her a GI appt   As planned .and  Help facilitate  Didn't know this message was sitting there   Dx diarrhea,change in bowel habits , Mild anemia  , on anticoagulation

## 2016-02-10 ENCOUNTER — Telehealth: Payer: Self-pay | Admitting: Gastroenterology

## 2016-02-10 NOTE — Telephone Encounter (Signed)
The pt has diarrhea without bleeding or pain, she has not tried imodium.  She has an appt with Dr Myrtie Neitheranis 03/19/16.  Pt was advised to try imodium 2 in the morning when she wakes up and again later in the day if needed.  She was added to the wait list and will call back if she worsens

## 2016-02-20 ENCOUNTER — Other Ambulatory Visit: Payer: Self-pay | Admitting: Internal Medicine

## 2016-02-25 ENCOUNTER — Encounter: Payer: Self-pay | Admitting: Gastroenterology

## 2016-02-27 ENCOUNTER — Encounter: Payer: Self-pay | Admitting: Internal Medicine

## 2016-02-27 ENCOUNTER — Other Ambulatory Visit: Payer: Self-pay | Admitting: *Deleted

## 2016-02-27 MED ORDER — APIXABAN 5 MG PO TABS: 5.0000 mg | ORAL_TABLET | Freq: Two times a day (BID) | ORAL | 2 refills | Status: DC

## 2016-02-27 MED ORDER — BISOPROLOL FUMARATE 5 MG PO TABS: ORAL_TABLET | ORAL | 2 refills | Status: DC

## 2016-03-19 ENCOUNTER — Encounter: Payer: Self-pay | Admitting: Gastroenterology

## 2016-03-19 ENCOUNTER — Ambulatory Visit (INDEPENDENT_AMBULATORY_CARE_PROVIDER_SITE_OTHER): Payer: Medicare Other | Admitting: Gastroenterology

## 2016-03-19 VITALS — BP 128/78 | HR 52 | Ht 64.37 in | Wt 184.0 lb

## 2016-03-19 DIAGNOSIS — R197 Diarrhea, unspecified: Secondary | ICD-10-CM

## 2016-03-19 DIAGNOSIS — I4892 Unspecified atrial flutter: Secondary | ICD-10-CM | POA: Diagnosis not present

## 2016-03-19 NOTE — Progress Notes (Signed)
[name]   Chief Complaint: diarrhea  Subjective  History: This is a 65 year old woman seen for diarrhea. She was here in August 2016 and saw our PA for the same thing, I reviewed that note in detail. It looks like she received some Flagyl and reportedly the diarrhea resolved. She was well until about 3 or 4 months ago, when it recurred the same as before. She has no abdominal pain, but just has 3 or 4 loose stools per day. He denies nocturnal diarrhea, episodes of fecal soilage or rectal bleeding.Penny Barnett reports that she was given a trial of Flagyl about 2 months ago without symptom improvement. She has seen no fatty or floating stools, she has no personal or family history of pancreatic disease and she is a former smoker.  ROS: Cardiovascular:  no chest pain Respiratory: no dyspnea No weight loss The patient's Past Medical, Family and Social History were reviewed and are on file in the EMR. PPI for years I reviewed 5/17 cardiology note  Objective:  Med list reviewed  Vital signs in last 24 hrs: Vitals:   03/19/16 1039  BP: 128/78  Pulse: (!) 52    Physical Exam    HEENT: sclera anicteric, oral mucosa moist without lesions  Neck: supple, no thyromegaly, JVD or lymphadenopathy  Cardiac: RRR without murmurs, S1S2 heard, no peripheral edema  Pulm: clear to auscultation bilaterally, normal RR and effort noted  Abdomen: soft, no tenderness, with active bowel sounds. No guarding or palpable hepatosplenomegaly.  Skin; warm and dry, no jaundice or rash  No stool tests have been done recently  @ASSESSMENTPLANBEGIN @ Assessment: Encounter Diagnoses  Name Primary?  . Diarrhea, unspecified type Yes  . Atrial flutter, unspecified type (HCC)    Difficult to tell if side effect of her long-standing Prevacid, underlying overt or microscopic colitis, less likely pancreatic insufficiency without risk factors or weight loss.  Plan: Check stool for C diff,  lactoferrin, elastase. Trial off PPI for one week If no better, colonoscopy to Bx for microscopic colitis. (would need to be off xarelto day prior).  She is concerned about the risk, but it appears to be low if she skips the dose of Xarelto the day before and day of procedure with plans to resume it that evening. Total time 25 minutes, over half spent in counseling and coordination of care.   Penny Barnett

## 2016-03-19 NOTE — Patient Instructions (Signed)
If you are age 67 or older, your body mass index should be between 23-30. Your Body mass index is 31.22 kg/m. If this is out of the aforementioned range listed, please consider follow up with your Primary Care Provider.  If you are age 67 or younger, your body mass index should be between 19-25. Your Body mass index is 31.22 kg/m. If this is out of the aformentioned range listed, please consider follow up with your Primary Care Provider.   Your physician has requested that you go to the basement for the lab work before leaving today.  Thank you for choosing [facility]  Dr Amada JupiterHenry Danis III

## 2016-03-23 ENCOUNTER — Other Ambulatory Visit: Payer: Medicare Other

## 2016-03-23 ENCOUNTER — Other Ambulatory Visit: Payer: Self-pay | Admitting: Gastroenterology

## 2016-03-23 DIAGNOSIS — R197 Diarrhea, unspecified: Secondary | ICD-10-CM

## 2016-03-23 DIAGNOSIS — I4892 Unspecified atrial flutter: Secondary | ICD-10-CM

## 2016-03-24 LAB — FECAL LACTOFERRIN, QUANT: Lactoferrin: DETECTED

## 2016-03-25 LAB — C. DIFFICILE GDH AND TOXIN A/B
C. DIFF TOXIN A/B: NOT DETECTED
C. DIFFICILE GDH: NOT DETECTED

## 2016-03-25 LAB — OVA AND PARASITE EXAMINATION: OP: NONE SEEN

## 2016-03-27 ENCOUNTER — Telehealth: Payer: Self-pay

## 2016-03-27 NOTE — Telephone Encounter (Signed)
03/27/2016   RE: Beau FannyDeidre A Mogg DOB: May 23, 1951 MRN: [id number]   Dear Tenny Crawoss,   We have scheduled the above patient for an endoscopic procedure with Dr. Myrtie Neitheranis. Our records show that she is on anticoagulation therapy.   Please advise as to how long the patient may come off her therapy of Eliquis prior to the procedure.    Please route the completed form to Elon Spanneroni Todd, CMA  Sincerely,    Darcey NoraSheri Jones

## 2016-03-29 NOTE — Telephone Encounter (Signed)
OK to stop Eliquis 5 days prior

## 2016-03-30 NOTE — Telephone Encounter (Signed)
Left voicemail for patient to call back. 

## 2016-03-31 ENCOUNTER — Encounter: Payer: Self-pay | Admitting: Gastroenterology

## 2016-03-31 LAB — PANCREATIC ELASTASE, FECAL

## 2016-04-01 NOTE — Telephone Encounter (Signed)
I have spoken with patient to schedule colonoscopy and previsit. Dr Tenny Crawoss has okayed patient holding Eliquis 5 days prior to test. Dr Myrtie Neitheranis, would you like patient to hold Eliquis the full 5 days that Dr Lawana Paioss okayed or can she hold it 48 hours prior as per our typical protocol?

## 2016-04-03 NOTE — Telephone Encounter (Signed)
I have spoken to patient to clarify that she should hold her Eliquis 2 days prior to procedure. She verbalizes understanding.

## 2016-04-03 NOTE — Telephone Encounter (Signed)
Thanks for checking.  Only 2 days prior is necessary

## 2016-04-22 ENCOUNTER — Ambulatory Visit: Payer: Medicare Other | Admitting: *Deleted

## 2016-04-22 VITALS — Ht 64.5 in | Wt 180.4 lb

## 2016-04-22 DIAGNOSIS — R197 Diarrhea, unspecified: Secondary | ICD-10-CM

## 2016-04-22 MED ORDER — SUPREP BOWEL PREP KIT 17.5-3.13-1.6 GM/177ML PO SOLN: 1.0000 | Freq: Once | ORAL | 0 refills | Status: AC

## 2016-04-22 NOTE — Progress Notes (Signed)
Patient denies any allergies to egg or soy products. Patient denies complications with anesthesia/sedation.  Patient denies oxygen use at home and denies diet medications. Emmi instructions for colonoscopy  explained but patient refused.     Patient is on Eliquis.  Per Dr Myrtie Neitheranis, patient to hold 2 days prior to procedure.

## 2016-04-27 ENCOUNTER — Ambulatory Visit (AMBULATORY_SURGERY_CENTER): Payer: Medicare Other | Admitting: Gastroenterology

## 2016-04-27 ENCOUNTER — Encounter: Payer: Self-pay | Admitting: Gastroenterology

## 2016-04-27 VITALS — BP 125/75 | HR 66 | Temp 97.3°F | Resp 22 | Ht 64.0 in | Wt 180.0 lb

## 2016-04-27 DIAGNOSIS — I429 Cardiomyopathy, unspecified: Secondary | ICD-10-CM | POA: Diagnosis not present

## 2016-04-27 DIAGNOSIS — K52839 Microscopic colitis, unspecified: Secondary | ICD-10-CM

## 2016-04-27 DIAGNOSIS — R197 Diarrhea, unspecified: Secondary | ICD-10-CM | POA: Diagnosis not present

## 2016-04-27 DIAGNOSIS — I10 Essential (primary) hypertension: Secondary | ICD-10-CM | POA: Diagnosis not present

## 2016-04-27 DIAGNOSIS — K219 Gastro-esophageal reflux disease without esophagitis: Secondary | ICD-10-CM | POA: Diagnosis not present

## 2016-04-27 DIAGNOSIS — K5730 Diverticulosis of large intestine without perforation or abscess without bleeding: Secondary | ICD-10-CM | POA: Diagnosis not present

## 2016-04-27 DIAGNOSIS — I4892 Unspecified atrial flutter: Secondary | ICD-10-CM | POA: Diagnosis not present

## 2016-04-27 DIAGNOSIS — K635 Polyp of colon: Secondary | ICD-10-CM | POA: Diagnosis not present

## 2016-04-27 DIAGNOSIS — K52831 Collagenous colitis: Secondary | ICD-10-CM | POA: Diagnosis not present

## 2016-04-27 MED ORDER — SODIUM CHLORIDE 0.9 % IV SOLN: 500.0000 mL | INTRAVENOUS | Status: DC

## 2016-04-27 NOTE — Progress Notes (Signed)
Called to room to assist during endoscopic procedure.  Patient ID and intended procedure confirmed with present staff. Received instructions for my participation in the procedure from the performing physician.  

## 2016-04-27 NOTE — Op Note (Signed)
[facility] Patient Name: Penny MarkerDeidre Barnett Procedure Date: 04/27/2016 2:07 PM MRN: [id number] Endoscopist: Sherilyn CooterHenry L. Myrtie Neitheranis , MD Age: [age] Referring MD:  Date of Birth: 1950/11/22 Gender: Female Account #: [id number] Procedure:                Colonoscopy Indications:              Chronic diarrhea Medicines:                Monitored Anesthesia Care Procedure:                Pre-Anesthesia Assessment:                           - Prior to the procedure, a History and Physical                            was performed, and patient medications and                            allergies were reviewed. The patient's tolerance of                            previous anesthesia was also reviewed. The risks                            and benefits of the procedure and the sedation                            options and risks were discussed with the patient.                            All questions were answered, and informed consent                            was obtained. Prior Anticoagulants: The patient has                            taken Eliquis (apixaban), last dose was 2 days                            prior to procedure. ASA Grade Assessment: III - A                            patient with severe systemic disease. After                            reviewing the risks and benefits, the patient was                            deemed in satisfactory condition to undergo the                            procedure.  After obtaining informed consent, the colonoscope                            was passed under direct vision. Throughout the                            procedure, the patient's blood pressure, pulse, and                            oxygen saturations were monitored continuously. The                            Model CF-HQ190L [id number]) scope was introduced                            through the anus and advanced to the the terminal    ileum. The colonoscopy was performed without                            difficulty. The patient tolerated the procedure                            well. The quality of the bowel preparation was                            excellent. The terminal ileum, ileocecal valve,                            appendiceal orifice, and rectum were photographed.                            The quality of the bowel preparation was evaluated                            using the BBPS [facility] Bowel Preparation Scale)                            with scores of: Right Colon = 3, Transverse Colon =                            3 and Left Colon = 3 (entire mucosa seen well with                            no residual staining, small fragments of stool or                            opaque liquid). The total BBPS score equals 9. The                            bowel preparation used was SUPREP. Scope In: 2:34:48 PM Scope Out: 2:43:51 PM Scope Withdrawal Time: 0 hours 7 minutes 20 seconds  Total Procedure Duration: 0 hours 9 minutes 3 seconds  Findings:  The perianal and digital rectal examinations were                            normal.                           The terminal ileum appeared normal.                           Normal mucosa was found in the entire colon.                            Biopsies for histology were taken with a cold                            forceps from the right colon and left colon for                            evaluation of microscopic colitis.                           Diverticula were found in the left colon.                           Internal hemorrhoids were found during anoscopy.                            The hemorrhoids were small and Grade I (internal                            hemorrhoids that do not prolapse).                           The exam was otherwise without abnormality on                            direct and retroflexion views. Complications:            No  immediate complications. Estimated Blood Loss:     Estimated blood loss: none. Impression:               - Normal mucosa in the entire examined colon.                            Biopsied.                           - Diverticulosis in the left colon.                           - Internal hemorrhoids.                           - The examination was otherwise normal on direct                            and retroflexion  views. Recommendation:           - Patient has a contact number available for                            emergencies. The signs and symptoms of potential                            delayed complications were discussed with the                            patient. Return to normal activities tomorrow.                            Written discharge instructions were provided to the                            patient.                           - Resume previous diet.                           - Continue present medications.                           - Resume Eliquis (apixaban) at prior dose tomorrow.                           - Await pathology results.                           - Repeat colonoscopy in 10 years for screening                            purposes. Savi Lastinger L. Myrtie Neitheranis, MD 04/27/2016 2:48:12 PM This report has been signed electronically.

## 2016-04-27 NOTE — Patient Instructions (Signed)
YOU HAD AN ENDOSCOPIC PROCEDURE TODAY AT THE [facility]:   Refer to the procedure report that was given to you for any specific questions about what was found during the examination.  If the procedure report does not answer your questions, please call your gastroenterologist to clarify.  If you requested that your care partner not be given the details of your procedure findings, then the procedure report has been included in a sealed envelope for you to review at your convenience later.  YOU SHOULD EXPECT: Some feelings of bloating in the abdomen. Passage of more gas than usual.  Walking can help get rid of the air that was put into your GI tract during the procedure and reduce the bloating. If you had a lower endoscopy (such as a colonoscopy or flexible sigmoidoscopy) you may notice spotting of blood in your stool or on the toilet paper. If you underwent a bowel prep for your procedure, you may not have a normal bowel movement for a few days.  Please Note:  You might notice some irritation and congestion in your nose or some drainage.  This is from the oxygen used during your procedure.  There is no need for concern and it should clear up in a day or so.  SYMPTOMS TO REPORT IMMEDIATELY:   Following lower endoscopy (colonoscopy or flexible sigmoidoscopy):  Excessive amounts of blood in the stool  Significant tenderness or worsening of abdominal pains  Swelling of the abdomen that is new, acute  Fever of 100F or higher  For urgent or emergent issues, a gastroenterologist can be reached at any hour by calling [phone].  DIET:  We do recommend a small meal at first, but then you may proceed to your regular diet.  Drink plenty of fluids but you should avoid alcoholic beverages for 24 hours.  ACTIVITY:  You should plan to take it easy for the rest of today and you should NOT DRIVE or use heavy machinery until tomorrow (because of the sedation medicines used during the test).     FOLLOW UP: Our staff will call the number listed on your records the next business day following your procedure to check on you and address any questions or concerns that you may have regarding the information given to you following your procedure. If we do not reach you, we will leave a message.  However, if you are feeling well and you are not experiencing any problems, there is no need to return our call.  We will assume that you have returned to your regular daily activities without incident.  If any biopsies were taken you will be contacted by phone or by letter within the next 1-3 weeks.  Please call us at [phone] if you have not heard about the biopsies in 3 weeks.   SIGNATURES/CONFIDENTIALITY: You and/or your care partner have signed paperwork which will be entered into your electronic medical record.  These signatures attest to the fact that that the information above on your After Visit Summary has been reviewed and is understood.  Full responsibility of the confidentiality of this discharge information lies with you and/or your care-partner.  RESUME ELIQUIS TOMORROW  Please read over handout about diverticulosis  Await pathology

## 2016-04-28 ENCOUNTER — Telehealth: Payer: Self-pay

## 2016-04-28 NOTE — Telephone Encounter (Signed)
  Follow up Call-  Call back number 04/27/2016  Post procedure Call Back phone  # [phone]  Permission to leave phone message Yes  Some recent data might be hidden     Patient questions:  Do you have a fever, pain , or abdominal swelling? No. Pain Score  0 *  Have you tolerated food without any problems? Yes.    Have you been able to return to your normal activities? Yes.    Do you have any questions about your discharge instructions: Diet   No. Medications  No. Follow up visit  No.  Do you have questions or concerns about your Care? No.  Actions: * If pain score is 4 or above: No action needed, pain <4.

## 2016-05-04 ENCOUNTER — Other Ambulatory Visit: Payer: Self-pay

## 2016-05-04 MED ORDER — BUDESONIDE 3 MG PO CPEP: 9.0000 mg | ORAL_CAPSULE | Freq: Every day | ORAL | 1 refills | Status: DC

## 2016-06-24 ENCOUNTER — Ambulatory Visit (INDEPENDENT_AMBULATORY_CARE_PROVIDER_SITE_OTHER): Payer: Medicare Other | Admitting: Gastroenterology

## 2016-06-24 ENCOUNTER — Encounter: Payer: Self-pay | Admitting: Gastroenterology

## 2016-06-24 VITALS — BP 112/70 | HR 64 | Ht 64.0 in | Wt 183.6 lb

## 2016-06-24 DIAGNOSIS — R197 Diarrhea, unspecified: Secondary | ICD-10-CM

## 2016-06-24 DIAGNOSIS — K52831 Collagenous colitis: Secondary | ICD-10-CM | POA: Diagnosis not present

## 2016-06-24 MED ORDER — DIPHENOXYLATE-ATROPINE 2.5-0.025 MG PO TABS: 1.0000 | ORAL_TABLET | Freq: Four times a day (QID) | ORAL | 3 refills | Status: DC | PRN

## 2016-06-24 NOTE — Patient Instructions (Signed)
Please follow up as needed.  If you are age 165 or older, your body mass index should be between 23-30. Your Body mass index is 31.51 kg/m. If this is out of the aforementioned range listed, please consider follow up with your Primary Care Provider.  If you are age 68 or younger, your body mass index should be between 19-25. Your Body mass index is 31.51 kg/m. If this is out of the aformentioned range listed, please consider follow up with your Primary Care Provider.   We have sent the following medications to your pharmacy for you to pick up at your convenience: Lomotil  Thank you for choosing [facility]  Dr Amada JupiterHenry Danis III

## 2016-06-24 NOTE — Progress Notes (Signed)
[facility] Progress Note  Chief Complaint: Microscopic colitis  Subjective  History:  Penny Barnett follows up after her recent colonoscopy. Biopsy showed collagenous colitis, which accounts for her diarrhea over the last couple of years. We talked about her Prevacid as a possible culprit, even though the underlying cause of this condition is unknown. She still needs to take it at least every other day for chronic heartburn that does not improve with ranitidine. She does not have nocturnal diarrhea, at most she has 2 or 3 semi-formed to loose stools per day, Sunday she has formed stool. Imodium has not helped in the past. She denies weight loss, and her appetite is good.  ROS: Cardiovascular:  no chest pain Respiratory: no dyspnea  The patient's Past Medical, Family and Social History were reviewed and are on file in the EMR.  Objective:  Med list reviewed  Vital signs in last 24 hrs: Vitals:   06/24/16 0925  BP: 112/70  Pulse: 64    Physical Exam    HEENT: sclera anicteric, oral mucosa moist without lesions  Neck: supple, no thyromegaly, JVD or lymphadenopathy  Cardiac: RRR without murmurs, S1S2 heard, no peripheral edema  Pulm: clear to auscultation bilaterally, normal RR and effort noted  Abdomen: soft, no tenderness, with active bowel sounds. No guarding or palpable hepatosplenomegaly.  Skin; warm and dry, no jaundice or rash  Recent Labs:  Pathology report shows collagenous colitis    @ASSESSMENTPLANBEGIN @ Assessment: Encounter Diagnoses  Name Primary?  . Diarrhea, unspecified type Yes  . Collagenous colitis     We discussed this condition and the available treatments. I initially tried her on budesonide, but it caused insomnia.  Plan: Lomotil as needed If that is insufficient help, a trial of Asacol could be used. I have generally found that to be less helpful then budesonide, especially with collagenous colitis because there is less of an inflammatory  component and there is in lymphocytic colitis.  I will see her as needed.  Total time 15 minutes, over half spent in counseling and coordination of care.   Penny PitterHenry L Danis Barnett

## 2016-07-06 ENCOUNTER — Ambulatory Visit (INDEPENDENT_AMBULATORY_CARE_PROVIDER_SITE_OTHER): Payer: Medicare Other | Admitting: Internal Medicine

## 2016-07-06 ENCOUNTER — Encounter: Payer: Self-pay | Admitting: Internal Medicine

## 2016-07-06 VITALS — BP 128/82 | HR 69 | Ht 64.0 in | Wt 184.0 lb

## 2016-07-06 DIAGNOSIS — I483 Typical atrial flutter: Secondary | ICD-10-CM | POA: Diagnosis not present

## 2016-07-06 NOTE — Progress Notes (Signed)
Cardiology Office Note   Date:  07/06/2016   ID:  JAYSHA LASURE, DOB 1951/02/04, MRN [id number]  PCP:  Lorretta Harp, MD  Cardiologist:   Dietrich Pates, MD   F/U of PA flutter and HTN      History of Present Illness: Penny Barnett is a 66 y.o. female with a history of atrial flutter    S/p TEE Cardiovrsioin in 2016    No dizziness  No palpitations  No SOB   Patient has 7 horses    Works in barn   No real change in activity levels       Current Meds  Medication Sig  . apixaban (ELIQUIS) 5 MG TABS tablet Take 1 tablet (5 mg total) by mouth 2 (two) times daily.  . bisoprolol (ZEBETA) 5 MG tablet TAKE 1 TABLET (5 MG TOTAL) BY MOUTH 2 (TWO) TIMES DAILY.  . cholecalciferol (VITAMIN D) 1000 UNITS tablet Take 1,000 Units by mouth daily.    . cyclobenzaprine (FLEXERIL) 5 MG tablet Take 1 tablet (5 mg total) by mouth 3 (three) times daily as needed for muscle spasms.  . diphenoxylate-atropine (LOMOTIL) 2.5-0.025 MG tablet Take 1 tablet by mouth 4 (four) times daily as needed for diarrhea or loose stools.  . fluticasone (FLOVENT HFA) 110 MCG/ACT inhaler Inhale 1 puff into the lungs 2 (two) times daily.  . hydrochlorothiazide (HYDRODIURIL) 25 MG tablet TAKE 1 TABLET EVERY DAY  . ipratropium (ATROVENT HFA) 17 MCG/ACT inhaler Inhale 2 puffs into the lungs 2 (two) times daily.  . IRON PO Take by mouth.  . lansoprazole (PREVACID) 15 MG capsule Take 15 mg by mouth daily at 12 noon.  . montelukast (SINGULAIR) 10 MG tablet TAKE 1 TABLET (10 MG TOTAL) BY MOUTH EVERY MORNING.  . Multiple Vitamins-Minerals (MULTIVITAMIN WITH MINERALS) tablet Take 1 tablet by mouth daily.    . potassium chloride SA (K-DUR,KLOR-CON) 20 MEQ tablet Take 1 tablet (20 mEq total) by mouth daily.  . rosuvastatin (CRESTOR) 10 MG tablet Take 5 mg by mouth daily. Taking every other day to avoid side effects   . saccharomyces boulardii (FLORASTOR) 250 MG capsule Take 1 capsule (250 mg total) by mouth 2 (two) times  daily.  . theophylline (THEODUR) 300 MG 12 hr tablet Take 300 mg by mouth daily.   . VENTOLIN HFA 108 (90 Base) MCG/ACT inhaler INHALE 2 PUFFS INTO THE LUNGS EVERY 6 HOURS AS NEEDED FOR WHEEZING OR SHORTNESS OF BREATH.   Current Facility-Administered Medications for the 07/06/16 encounter (Office Visit) with Pricilla Riffle, MD  Medication  . 0.9 %  sodium chloride infusion     Allergies:   Penicillins   Past Medical History:  Diagnosis Date  . Allergy   . Anemia   . Arthritis    hands  . Asthma    as a child and then attack 1996  poss allergic to cat hair. ventilated x 2 . saw Dr [place] Callas then .   Marland Kitchen Atrial flutter (HCC)    a. 12/2014 Echo: EF 60-65%, no rwma, mild MR;  b. S/P TEE/DCCV;  c. CHA2DS2VASc = 3-->Eliquis.  . Cataract    Had surgery to remove  . GERD (gastroesophageal reflux disease)    2000 taking otc prevacid   evey day.   . Hyperlipidemia   . Hypertension     3 meds controlled  . Kidney stone    passed stone - no surgery required    Past Surgical History:  Procedure Laterality Date  .  CARDIOVERSION N/A 01/03/2015   Procedure: CARDIOVERSION;  Surgeon: Pricilla RifflePaula Dajah Fischman V, MD;  [facility];  Service: Cardiovascular;  Laterality: N/A;  . CATARACT EXTRACTION Bilateral 10/14/2010   both eyes  . COLONOSCOPY  02/2011   Arlyce DiceKaplan  . SHOULDER SURGERY Bilateral    x 2 - frozen shoulder rt 2006, 2000 left   . TEE WITHOUT CARDIOVERSION N/A 01/03/2015   Procedure: TRANSESOPHAGEAL ECHOCARDIOGRAM (TEE);  Surgeon: Pricilla RifflePaula Kaitlin Alcindor V, MD;  [facility];  Service: Cardiovascular;  Laterality: N/A;  . TONSILLECTOMY       Social History:  The patient  reports that she quit smoking about 30 years ago. Her smoking use included Cigarettes. She has a 20.00 pack-year smoking history. She has never used smokeless tobacco. She reports that she does not drink alcohol or use drugs.   Family History:  The patient's family history includes Alcohol abuse in her father; Bladder Cancer in  her mother; Breast cancer in her mother; Uterine cancer in her sister.    ROS:  Please see the history of present illness. All other systems are reviewed and  Negative to the above problem except as noted.    PHYSICAL EXAM: VS:  BP 128/82   Pulse 69   Ht 5\' 4"  (1.626 m)   Wt 184 lb (83.5 kg)   BMI 31.58 kg/m   GEN: Well nourished, well developed, in no acute distress  HEENT: normal  Neck: no JVD, carotid bruits, or masses Cardiac: RRR; no murmurs, rubs, or gallops,no edema  Respiratory:  clear to auscultation bilaterally, normal work of breathing GI: soft, nontender, nondistended, + BS  No hepatomegaly  MS: no deformity Moving all extremities   Skin: warm and dry, no rash Neuro:  Strength and sensation are intact Psych: euthymic mood, full affect   EKG:  EKG is not ordered today.   Lipid Panel    Component Value Date/Time   CHOL 177 10/25/2015 0947   TRIG 147.0 10/25/2015 0947   HDL 50.80 10/25/2015 0947   CHOLHDL 3 10/25/2015 0947   VLDL 29.4 10/25/2015 0947   LDLCALC 97 10/25/2015 0947   LDLDIRECT 159.2 10/07/2012 0813      Wt Readings from Last 3 Encounters:  07/06/16 184 lb (83.5 kg)  06/24/16 183 lb 9.6 oz (83.3 kg)  04/27/16 180 lb (81.6 kg)      ASSESSMENT AND PLAN:  1   Atrial flutter  No recurrence clinically  Keep on Eliquis   2  HL  Lpids are good  Keep on Crestor    3  HTN  BP is well controlled  Keep on current regimen  Get BMET and CBC today  F/U in clinic in November    Current medicines are reviewed at length with the patient today.  The patient does not have concerns regarding medicines.  Signed, Dietrich PatesPaula Yerik Zeringue, MD  07/06/2016 8:13 AM    [facility] [address], [place], [place]  [address] Phone: [phone]; Fax: [phone]

## 2016-07-06 NOTE — Patient Instructions (Signed)
Your physician recommends that you continue on your current medications as directed. Please refer to the Current Medication list given to you today.  Your physician recommends that you return for lab work (BMET, CBC).  Your physician wants you to follow-up in: November, 2018 with Dr. Tenny Crawoss.  You will receive a reminder letter in the mail two months in advance. If you don't receive a letter, please call our office to schedule the follow-up appointment.

## 2016-07-07 LAB — CBC
HEMATOCRIT: 38.1 % (ref 34.0–46.6)
HEMOGLOBIN: 12.6 g/dL (ref 11.1–15.9)
MCH: 28.4 pg (ref 26.6–33.0)
MCHC: 33.1 g/dL (ref 31.5–35.7)
MCV: 86 fL (ref 79–97)
Platelets: 209 10*3/uL (ref 150–379)
RBC: 4.44 x10E6/uL (ref 3.77–5.28)
RDW: 15 % (ref 12.3–15.4)
WBC: 5.8 10*3/uL (ref 3.4–10.8)

## 2016-07-07 LAB — BASIC METABOLIC PANEL
BUN/Creatinine Ratio: 25 (ref 12–28)
BUN: 14 mg/dL (ref 8–27)
CALCIUM: 9.3 mg/dL (ref 8.7–10.3)
CO2: 26 mmol/L (ref 18–29)
CREATININE: 0.57 mg/dL (ref 0.57–1.00)
Chloride: 102 mmol/L (ref 96–106)
GFR calc Af Amer: 111 mL/min/{1.73_m2} (ref >59)
GFR calc non Af Amer: 96 mL/min/{1.73_m2} (ref >59)
GLUCOSE: 110 mg/dL — AB (ref 65–99)
POTASSIUM: 3.6 mmol/L (ref 3.5–5.2)
SODIUM: 143 mmol/L (ref 134–144)

## 2016-07-17 NOTE — Progress Notes (Signed)
Pre visit review using our clinic review tool, if applicable. No additional management support is needed unless otherwise documented below in the visit note.  Chief Complaint  Patient presents with  . Productive Cough    X10-14 days.  Penny Barnett    HPI: Penny Barnett 66 y.o.  sda  appt   Ongoing ur sx  See above   No sinus  Pain   Cough yellow green    Worse last week.   And some better today .   Non fever vomiting  SOB  Not particulary nocturnal an dnosb and pain  Want to make sure she doesn't hav e PNA.  ROS: See pertinent positives and negatives per HPI.  diarrhea awas  Lymphocytic colitis pred keps her up so didn't take all  Past Medical History:  Diagnosis Date  . Allergy   . Anemia   . Arthritis    hands  . Asthma    as a child and then attack 1996  poss allergic to cat hair. ventilated x 2 . saw Dr [place] Callas then .   Marland Kitchen Atrial flutter (HCC)    a. 12/2014 Echo: EF 60-65%, no rwma, mild MR;  b. S/P TEE/DCCV;  c. CHA2DS2VASc = 3-->Eliquis.  . Cataract    Had surgery to remove  . GERD (gastroesophageal reflux disease)    2000 taking otc prevacid   evey day.   . Hyperlipidemia   . Hypertension     3 meds controlled  . Kidney stone    passed stone - no surgery required    Family History  Problem Relation Age of Onset  . Breast cancer Mother   . Bladder Cancer Mother   . Uterine cancer Sister   . Alcohol abuse Father   . Colon cancer Neg Hx   . Heart attack Neg Hx   . Stroke Neg Hx   . Hypertension Neg Hx   . Esophageal cancer Neg Hx   . Rectal cancer Neg Hx   . Stomach cancer Neg Hx     Social History   Social History  . Marital status: Single    Spouse name: N/A  . Number of children: N/A  . Years of education: N/A   Occupational History  . CSR    Social History Main Topics  . Smoking status: Former Smoker    Packs/day: 1.00    Years: 20.00    Types: Cigarettes    Quit date: 06/21/1986  . Smokeless tobacco: Never Used  . Alcohol use No  . Drug  use: No  . Sexual activity: Not Asked   Other Topics Concern  . 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    HS education  single   6 hours of sleep   Hh  Of 1  Pet dog and a horse.    Remote tobacco  1-2 ppd stopped 1988    Etoh couple times a month rare    ocass  Diet soda.                 Outpatient Medications Prior to Visit  Medication Sig Dispense Refill  . apixaban (ELIQUIS) 5 MG TABS tablet Take 1 tablet (5 mg total) by mouth 2 (two) times daily. 180 tablet  2  . bisoprolol (ZEBETA) 5 MG tablet TAKE 1 TABLET (5 MG TOTAL) BY MOUTH 2 (TWO) TIMES DAILY. 180 tablet 2  . cholecalciferol (VITAMIN D) 1000 UNITS tablet Take 1,000 Units by mouth daily.      . cyclobenzaprine (FLEXERIL) 5 MG tablet Take 1 tablet (5 mg total) by mouth 3 (three) times daily as needed for muscle spasms. 30 tablet 2  . fluticasone (FLOVENT HFA) 110 MCG/ACT inhaler Inhale 1 puff into the lungs 2 (two) times daily. 3 Inhaler 3  . hydrochlorothiazide (HYDRODIURIL) 25 MG tablet TAKE 1 TABLET EVERY DAY 90 tablet 3  . ipratropium (ATROVENT HFA) 17 MCG/ACT inhaler Inhale 2 puffs into the lungs 2 (two) times daily.    . IRON PO Take by mouth.    . lansoprazole (PREVACID) 15 MG capsule Take 15 mg by mouth daily at 12 noon.    . montelukast (SINGULAIR) 10 MG tablet TAKE 1 TABLET (10 MG TOTAL) BY MOUTH EVERY MORNING. 90 tablet 3  . Multiple Vitamins-Minerals (MULTIVITAMIN WITH MINERALS) tablet Take 1 tablet by mouth daily.      . potassium chloride SA (K-DUR,KLOR-CON) 20 MEQ tablet Take 1 tablet (20 mEq total) by mouth daily. 30 tablet 3  . rosuvastatin (CRESTOR) 10 MG tablet Take 5 mg by mouth daily. Taking every other day to avoid side effects     . saccharomyces boulardii (FLORASTOR) 250 MG capsule Take 1 capsule (250 mg total) by mouth 2 (two) times daily. 60 capsule 0  .  theophylline (THEODUR) 300 MG 12 hr tablet Take 300 mg by mouth daily.     . VENTOLIN HFA 108 (90 Base) MCG/ACT inhaler INHALE 2 PUFFS INTO THE LUNGS EVERY 6 HOURS AS NEEDED FOR WHEEZING OR SHORTNESS OF BREATH. 54 g 1  . diphenoxylate-atropine (LOMOTIL) 2.5-0.025 MG tablet Take 1 tablet by mouth 4 (four) times daily as needed for diarrhea or loose stools. 45 tablet 3   Facility-Administered Medications Prior to Visit  Medication Dose Route Frequency Provider Last Rate Last Dose  . 0.9 %  sodium chloride infusion  500 mL Intravenous Continuous Charlie PitterHenry L Danis III, MD         EXAM:  BP 116/82 (BP Location: Left Arm, Patient Position: Sitting, Cuff Size: Normal)   Pulse 60   Temp 98.9 F (37.2 C) (Oral)   Ht 5\' 4"  (1.626 m)   Wt 184 lb (83.5 kg)   SpO2 98%   BMI 31.58 kg/m   Body mass index is 31.58 kg/m. WDWN in NAD  quiet respirations; mildly congested  somewhat hoarse. Non toxic . HEENT: Normocephalic ;atraumatic , Eyes;  PERRL, EOMs  Full, lids and conjunctiva clear,,Ears: no deformities, canals nl, TM landmarks normal, Nose: no deformity or discharge but congested;face minimally tender Mouth : OP clear without lesion or edema . Neck: Supple without adenopathy or masses or bruits Chest:  Clear to A without wheezes rales or rhonchi CV:  S1-S2 no gallops or murmurs peripheral perfusion is normal Skin :nl perfusion and no acute rashes    ASSESSMENT AND PLAN:  Discussed the following assessment and plan:  Persistent cough - Plan: DG Chest 2 View  Collagenous colitis Sound like viral resp infection andn has hx of asthma   No wheezing today   Close observation   X ray this week fof alarm sx   Asthma inhaler prevention  -Patient advised to return or notify health care team  if symptoms worsen ,persist or new concerns arise.  Patient Instructions  I think this is a viral respiratory infection   That should  Get better on its own . I don't see evidence of pneumonia today however if  not continuing to improve or getting relapsing symptoms or if you get a fever and chills you need to contact us about her status.  However if not getting better at the 2-3 mark You can get an x-ray test they are at the [facility] radiology lab department I will put order in today.  Cough medicines do not sure they are for comfort hot tea and honey may work just as well. Let us know if you want a prescription cough medicine such as Tessalon Perles her hydrocodone.      Neta Mends. Windsor Goeken M.D.

## 2016-07-20 ENCOUNTER — Ambulatory Visit (INDEPENDENT_AMBULATORY_CARE_PROVIDER_SITE_OTHER): Payer: Medicare Other | Admitting: Internal Medicine

## 2016-07-20 ENCOUNTER — Encounter: Payer: Self-pay | Admitting: Internal Medicine

## 2016-07-20 VITALS — BP 116/82 | HR 60 | Temp 98.9°F | Ht 64.0 in | Wt 184.0 lb

## 2016-07-20 DIAGNOSIS — R053 Chronic cough: Secondary | ICD-10-CM

## 2016-07-20 DIAGNOSIS — K52831 Collagenous colitis: Secondary | ICD-10-CM

## 2016-07-20 DIAGNOSIS — R05 Cough: Secondary | ICD-10-CM | POA: Diagnosis not present

## 2016-07-20 NOTE — Patient Instructions (Addendum)
I think this is a viral respiratory infection   That should  Get better on its own . I don't see evidence of pneumonia today however if not continuing to improve or getting relapsing symptoms or if you get a fever and chills you need to contact us about her status.  However if not getting better at the 2-3 mark You can get an x-ray test they are at [facility] I will put order in today.  Cough medicines do not sure they are for comfort hot tea and honey may work just as well. Let us know if you want a prescription cough medicine such as Tessalon Perles her hydrocodone.

## 2016-07-25 DIAGNOSIS — K52831 Collagenous colitis: Secondary | ICD-10-CM | POA: Insufficient documentation

## 2016-07-28 ENCOUNTER — Encounter: Payer: Self-pay | Admitting: Internal Medicine

## 2016-07-30 NOTE — Telephone Encounter (Signed)
[facility]  [place]  [address], [place], [place] [address] [phone]   Let us know if you need a referral

## 2016-09-07 ENCOUNTER — Other Ambulatory Visit: Payer: Self-pay | Admitting: Internal Medicine

## 2016-09-07 DIAGNOSIS — Z1231 Encounter for screening mammogram for malignant neoplasm of breast: Secondary | ICD-10-CM

## 2016-10-21 ENCOUNTER — Ambulatory Visit: Payer: Medicare Other

## 2016-10-22 ENCOUNTER — Ambulatory Visit
Admission: RE | Admit: 2016-10-22 | Discharge: 2016-10-22 | Disposition: A | Discharge status: Home / Self Care | Payer: Medicare Other | Source: Ambulatory Visit | Attending: Internal Medicine | Admitting: Internal Medicine

## 2016-10-22 DIAGNOSIS — Z1231 Encounter for screening mammogram for malignant neoplasm of breast: Secondary | ICD-10-CM

## 2016-10-25 ENCOUNTER — Other Ambulatory Visit: Payer: Self-pay | Admitting: Internal Medicine

## 2016-11-09 ENCOUNTER — Other Ambulatory Visit: Payer: Self-pay | Admitting: Internal Medicine

## 2016-11-17 NOTE — Progress Notes (Signed)
Chief Complaint  Patient presents with  . Annual Exam  . Medication Management  . Asthma  . Hyperlipidemia    HPI: Penny Barnett 66 y.o. come in for Chronic disease management  Yearly check  resp  Never had pfts  Doing ok  On zebeta  As needed inhaler  In am flovent atroven and ocass albuterol dialy .  HT ok  Off quinapril  On anticoag for atrial flutter   Gi  Better than before   micrposcopiic   Colitis    On crestor for HLD Ask for refill flexeril ocass use  m spasm  LIFESTYLE:  Exercise:  Dog walking  Horses  Tobacco/ETS: no Alcohol:  no Sugar beverages: Sleep: 6-7  Drug use: no hh of 1 1 dog  Work : 20 or so     ROS: See pertinent positives and negatives per HPI. No cp sob now  On meds fora sthma inc theodur   Past Medical History:  Diagnosis Date  . Allergy   . Anemia   . Arthritis    hands  . Asthma    as a child and then attack 1996  poss allergic to cat hair. ventilated x 2 . saw Dr [place] Callas then .   Marland Kitchen Atrial flutter (HCC)    a. 12/2014 Echo: EF 60-65%, no rwma, mild MR;  b. S/P TEE/DCCV;  c. CHA2DS2VASc = 3-->Eliquis.  . Cataract    Had surgery to remove  . GERD (gastroesophageal reflux disease)    2000 taking otc prevacid   evey day.   . Hyperlipidemia   . Hypertension     3 meds controlled  . Kidney stone    passed stone - no surgery required    Family History  Problem Relation Age of Onset  . Breast cancer Mother   . Bladder Cancer Mother   . Uterine cancer Sister   . Alcohol abuse Father   . Breast cancer Maternal Grandmother   . Colon cancer Neg Hx   . Heart attack Neg Hx   . Stroke Neg Hx   . Hypertension Neg Hx   . Esophageal cancer Neg Hx   . Rectal cancer Neg Hx   . Stomach cancer Neg Hx     Social History   Social History  . Marital status: Single    Spouse name: N/A  . Number of children: N/A  . Years of education: N/A   Occupational History  . CSR    Social History Main Topics  . Smoking status: Former Smoker   Packs/day: 1.00    Years: 20.00    Types: Cigarettes    Quit date: 06/21/1986  . Smokeless tobacco: Never Used  . Alcohol use No  . Drug use: No  . Sexual activity: Not Asked   Other Topics Concern  . 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    HS education  single   6 hours of sleep   Hh  Of 1  Pet dog and a horse.    Remote tobacco  1-2 ppd stopped 1988    Etoh couple times a month rare    ocass  Diet soda.  Outpatient Medications Prior to Visit  Medication Sig Dispense Refill  . bisoprolol (ZEBETA) 5 MG tablet TAKE 1 TABLET (5 MG TOTAL) BY MOUTH 2 (TWO) TIMES DAILY. 180 tablet 2  . cholecalciferol (VITAMIN D) 1000 UNITS tablet Take 1,000 Units by mouth daily.      Marland Kitchen ELIQUIS 5 MG TABS tablet TAKE 1 TABLET (5 MG TOTAL) BY MOUTH 2 (TWO) TIMES DAILY. 180 tablet 3  . FLOVENT HFA 110 MCG/ACT inhaler INHALE 1 PUFF TWICE DAILY 24 g 3  . hydrochlorothiazide (HYDRODIURIL) 25 MG tablet TAKE 1 TABLET EVERY DAY 90 tablet 3  . ipratropium (ATROVENT HFA) 17 MCG/ACT inhaler Inhale 2 puffs into the lungs 2 (two) times daily.    . IRON PO Take by mouth.    . lansoprazole (PREVACID) 15 MG capsule Take 15 mg by mouth daily at 12 noon.    . montelukast (SINGULAIR) 10 MG tablet TAKE 1 TABLET (10 MG TOTAL) BY MOUTH EVERY MORNING. 90 tablet 3  . Multiple Vitamins-Minerals (MULTIVITAMIN WITH MINERALS) tablet Take 1 tablet by mouth daily.      . potassium chloride SA (K-DUR,KLOR-CON) 20 MEQ tablet Take 1 tablet (20 mEq total) by mouth daily. 30 tablet 3  . rosuvastatin (CRESTOR) 10 MG tablet Take 5 mg by mouth daily. Taking every other day to avoid side effects     . theophylline (THEODUR) 300 MG 12 hr tablet Take 300 mg by mouth daily.     . VENTOLIN HFA 108 (90 Base) MCG/ACT inhaler INHALE 2 PUFFS INTO THE LUNGS EVERY 6 HOURS AS NEEDED FOR  WHEEZING OR SHORTNESS OF BREATH. 54 g 0  . cyclobenzaprine (FLEXERIL) 5 MG tablet Take 1 tablet (5 mg total) by mouth 3 (three) times daily as needed for muscle spasms. 30 tablet 2  . saccharomyces boulardii (FLORASTOR) 250 MG capsule Take 1 capsule (250 mg total) by mouth 2 (two) times daily. (Patient not taking: Reported on 11/18/2016) 60 capsule 0   Facility-Administered Medications Prior to Visit  Medication Dose Route Frequency Provider Last Rate Last Dose  . 0.9 %  sodium chloride infusion  500 mL Intravenous Continuous Danis, [place]arr [place] III, MD         EXAM:  BP 122/80 (BP Location: Right Arm, Patient Position: Sitting, Cuff Size: Normal)   Pulse (!) 57   Temp 97.9 F (36.6 C) (Oral)   Ht 5' 4.5" (1.638 m)   Wt 176 lb 14.4 oz (80.2 kg)   BMI 29.90 kg/m   Body mass index is 29.9 kg/m.  GENERAL: vitals reviewed and listed above, alert, oriented, appears well hydrated and in no acute distress HEENT: atraumatic, conjunctiva  clear, no obvious abnormalities on inspection of external nose and ears tm clearOP : no lesion edema or exudate  NECK: no obvious masses on inspection palpation  LUNGS: clear to auscultation bilaterally, no wheezes, rales or rhonchi, good air movement CV: HRRR, no clubbing cyanosis or  peripheral edema nl cap refill  Abdomen:  Sof,t normal bowel sounds without hepatosplenomegaly, no guarding rebound or masses no CVA tenderness MS: moves all extremities without noticeable focal  abnormality PSYCH: pleasant and cooperative, no obvious depression or anxiety Lab Results  Component Value Date   WBC 5.8 07/06/2016   HGB 12.6 07/06/2016   HCT 38.1 07/06/2016   PLT 209 07/06/2016   GLUCOSE 110 (H) 07/06/2016   CHOL 174 11/18/2016   TRIG 191.0 (H) 11/18/2016   HDL 59.00 11/18/2016   LDLDIRECT 159.2 10/07/2012   LDLCALC  77 11/18/2016   ALT 19 11/18/2016   AST 17 11/18/2016   NA 143 07/06/2016   K 3.6 07/06/2016   CL 102 07/06/2016   CREATININE 0.57 07/06/2016    BUN 14 07/06/2016   CO2 26 07/06/2016   TSH 1.22 10/25/2015   BP Readings from Last 3 Encounters:  11/18/16 122/80  07/20/16 116/82  07/06/16 128/82    ASSESSMENT AND PLAN:  Discussed the following assessment and plan:  Asthma, unspecified asthma severity, unspecified whether complicated, unspecified whether persistent - Plan: Lipid panel, Hepatic function panel, Pulmonary function test  Hyperlipidemia, unspecified hyperlipidemia type - Plan: Lipid panel, Hepatic function panel  Medication management - Plan: Lipid panel, Hepatic function panel, Pulmonary function test  Estrogen deficiency - Plan: DG Bone Density  Collagenous colitis  Chronic anticoagulation Bone density due order placed last one 2012 -1.0  t score -Patient advised to return or notify health care team  if  new concerns arise.  Patient Instructions  I still advise  To get PFTs  As planned before .  Get appt for dexa scan   Will send in flexeril rx  Caution with use     If all ok and depending on results   Yearly  Ov with me     Neta MendsWanda K. Pualani Barnett M.D.

## 2016-11-18 ENCOUNTER — Ambulatory Visit (INDEPENDENT_AMBULATORY_CARE_PROVIDER_SITE_OTHER): Payer: Medicare Other | Admitting: Internal Medicine

## 2016-11-18 ENCOUNTER — Encounter: Payer: Self-pay | Admitting: Internal Medicine

## 2016-11-18 VITALS — BP 122/80 | HR 57 | Temp 97.9°F | Ht 64.5 in | Wt 176.9 lb

## 2016-11-18 DIAGNOSIS — E785 Hyperlipidemia, unspecified: Secondary | ICD-10-CM

## 2016-11-18 DIAGNOSIS — K52831 Collagenous colitis: Secondary | ICD-10-CM | POA: Diagnosis not present

## 2016-11-18 DIAGNOSIS — E2839 Other primary ovarian failure: Secondary | ICD-10-CM

## 2016-11-18 DIAGNOSIS — Z79899 Other long term (current) drug therapy: Secondary | ICD-10-CM

## 2016-11-18 DIAGNOSIS — Z7901 Long term (current) use of anticoagulants: Secondary | ICD-10-CM

## 2016-11-18 DIAGNOSIS — J45909 Unspecified asthma, uncomplicated: Secondary | ICD-10-CM

## 2016-11-18 LAB — HEPATIC FUNCTION PANEL
ALBUMIN: 4.7 g/dL (ref 3.5–5.2)
ALT: 19 U/L (ref 0–35)
AST: 17 U/L (ref 0–37)
Alkaline Phosphatase: 72 U/L (ref 39–117)
Bilirubin, Direct: 0.1 mg/dL (ref 0.0–0.3)
TOTAL PROTEIN: 7.3 g/dL (ref 6.0–8.3)
Total Bilirubin: 0.6 mg/dL (ref 0.2–1.2)

## 2016-11-18 LAB — LIPID PANEL
CHOLESTEROL: 174 mg/dL (ref 0–200)
HDL: 59 mg/dL (ref >39.00)
LDL CALC: 77 mg/dL (ref 0–99)
NonHDL: 114.87
TRIGLYCERIDES: 191 mg/dL — AB (ref 0.0–149.0)
Total CHOL/HDL Ratio: 3
VLDL: 38.2 mg/dL (ref 0.0–40.0)

## 2016-11-18 MED ORDER — CYCLOBENZAPRINE HCL 5 MG PO TABS: 5.0000 mg | ORAL_TABLET | Freq: Three times a day (TID) | ORAL | 1 refills | Status: DC | PRN

## 2016-11-18 NOTE — Patient Instructions (Addendum)
I still advise  To get PFTs  As planned before .  Get appt for dexa scan   Will send in flexeril rx  Caution with use     If all ok and depending on results   Yearly  Ov with me

## 2016-11-26 ENCOUNTER — Encounter: Payer: Self-pay | Admitting: Internal Medicine

## 2016-12-14 NOTE — Progress Notes (Addendum)
Subjective:   Penny Barnett is a 66 y.o. female who presents for Medicare Annual (Subsequent) preventive examination.  Review of Systems:  No ROS.  Medicare Wellness Visit. Additional risk factors are reflected in the social history.  Cardiac Risk Factors include: advanced age (>73men, >10 women);hypertension;dyslipidemia;obesity (BMI >30kg/m2)   Sleep patterns: 6-7 hrs/night. Gets up once to use restroom. Sometimes gets up feeling rested. Sometimes still feels tired. Home Safety/Smoke Alarms: Feels safe in home. Smoke alarms in place.  Living environment; residence and Firearm Safety: Lives alone in one story.  Seat Belt Safety/Bike Helmet: Wears seat belt.   Counseling:   Dental- Yearly. Dr Val Eagle office.   Female:   Pap-      Last was May 2014. Pt believes that she did not need another for 5 years.  Mammo-    10/22/2016 Normal.   Dexa scan-   06/24/2010 Normal.  Order was placed at last visit. Pt states that she will schedule it.     CCS- 04/27/2016 10 year recall.     Objective:     Vitals: BP 110/72 (BP Location: Left Arm, Patient Position: Sitting, Cuff Size: Normal)   Pulse (!) 55   Resp 16   Ht 5\' 5"  (1.651 m)   Wt 175 lb 11.2 oz (79.7 kg)   SpO2 97%   BMI 29.24 kg/m   Body mass index is 29.24 kg/m.   Tobacco History  Smoking Status  . Former Smoker  . Packs/day: 1.00  . Years: 20.00  . Types: Cigarettes  . Quit date: 06/21/1986  Smokeless Tobacco  . Never Used     Counseling given: Not Answered   Past Medical History:  Diagnosis Date  . Allergy   . Anemia   . Arthritis    hands  . Asthma    as a child and then attack 1996  poss allergic to cat hair. ventilated x 2 . saw Dr [place] Callas then .   Marland Kitchen Atrial flutter (HCC)    a. 12/2014 Echo: EF 60-65%, no rwma, mild MR;  b. S/P TEE/DCCV;  c. CHA2DS2VASc = 3-->Eliquis.  . Cataract    Had surgery to remove  . GERD (gastroesophageal reflux disease)    2000 taking otc prevacid   evey day.   .  Hyperlipidemia   . Hypertension     3 meds controlled  . Kidney stone    passed stone - no surgery required   Past Surgical History:  Procedure Laterality Date  . CARDIOVERSION N/A 01/03/2015   Procedure: CARDIOVERSION;  Surgeon: Pricilla Riffle, MD;  Location: [facility] ENDOSCOPY;  Service: Cardiovascular;  Laterality: N/A;  . CATARACT EXTRACTION Bilateral 10/14/2010   both eyes  . COLONOSCOPY  02/2011   Arlyce Dice  . SHOULDER SURGERY Bilateral    x 2 - frozen shoulder rt 2006, 2000 left   . TEE WITHOUT CARDIOVERSION N/A 01/03/2015   Procedure: TRANSESOPHAGEAL ECHOCARDIOGRAM (TEE);  Surgeon: Pricilla Riffle, MD;  Location: [facility] ENDOSCOPY;  Service: Cardiovascular;  Laterality: N/A;  . TONSILLECTOMY     Family History  Problem Relation Age of Onset  . Breast cancer Mother   . Bladder Cancer Mother   . Uterine cancer Sister   . Alcohol abuse Father   . Breast cancer Maternal Grandmother   . Colon cancer Neg Hx   . Heart attack Neg Hx   . Stroke Neg Hx   . Hypertension Neg Hx   . Esophageal cancer Neg Hx   . Rectal  cancer Neg Hx   . Stomach cancer Neg Hx    History  Sexual Activity  . Sexual activity: Not on file    Outpatient Encounter Prescriptions as of 12/15/2016  Medication Sig  . bisoprolol (ZEBETA) 5 MG tablet TAKE 1 TABLET (5 MG TOTAL) BY MOUTH 2 (TWO) TIMES DAILY.  . cholecalciferol (VITAMIN D) 1000 UNITS tablet Take 1,000 Units by mouth daily.    . cyclobenzaprine (FLEXERIL) 5 MG tablet Take 1 tablet (5 mg total) by mouth 3 (three) times daily as needed for muscle spasms.  Marland Kitchen ELIQUIS 5 MG TABS tablet TAKE 1 TABLET (5 MG TOTAL) BY MOUTH 2 (TWO) TIMES DAILY.  Marland Kitchen FLOVENT HFA 110 MCG/ACT inhaler INHALE 1 PUFF TWICE DAILY  . hydrochlorothiazide (HYDRODIURIL) 25 MG tablet TAKE 1 TABLET EVERY DAY  . ipratropium (ATROVENT HFA) 17 MCG/ACT inhaler Inhale 2 puffs into the lungs 2 (two) times daily.  . IRON PO Take by mouth.  . lansoprazole (PREVACID) 15 MG capsule Take 15 mg by mouth daily at  12 noon.  . montelukast (SINGULAIR) 10 MG tablet TAKE 1 TABLET (10 MG TOTAL) BY MOUTH EVERY MORNING.  . Multiple Vitamins-Minerals (MULTIVITAMIN WITH MINERALS) tablet Take 1 tablet by mouth daily.    . potassium chloride SA (K-DUR,KLOR-CON) 20 MEQ tablet Take 1 tablet (20 mEq total) by mouth daily.  . rosuvastatin (CRESTOR) 10 MG tablet Take 5 mg by mouth daily. Taking every other day to avoid side effects   . theophylline (THEODUR) 300 MG 12 hr tablet Take 300 mg by mouth daily.   . VENTOLIN HFA 108 (90 Base) MCG/ACT inhaler INHALE 2 PUFFS INTO THE LUNGS EVERY 6 HOURS AS NEEDED FOR WHEEZING OR SHORTNESS OF BREATH.  . [DISCONTINUED] saccharomyces boulardii (FLORASTOR) 250 MG capsule Take 1 capsule (250 mg total) by mouth 2 (two) times daily. (Patient not taking: Reported on 11/18/2016)   Facility-Administered Encounter Medications as of 12/15/2016  Medication  . 0.9 %  sodium chloride infusion    Activities of Daily Living In your present state of health, do you have any difficulty performing the following activities: 12/15/2016  Hearing? N  Vision? N  Difficulty concentrating or making decisions? N  Walking or climbing stairs? N  Dressing or bathing? N  Doing errands, shopping? N  Preparing Food and eating ? N  Using the Toilet? N  In the past six months, have you accidently leaked urine? N  Do you have problems with loss of bowel control? N  Managing your Medications? N  Managing your Finances? N  Housekeeping or managing your Housekeeping? N  Some recent data might be hidden    Patient Care Team: Panosh, Neta Mends, MD as PCP - General (Internal Medicine) Nita Sells, MD (Dermatology) Pricilla Riffle, MD as Consulting Physician (Cardiology) Myrtie Neither Andreas Blower, MD as Consulting Physician (Gastroenterology)    Assessment:          Exercise Activities and Dietary recommendations Current Exercise Habits: Home exercise routine, Type of exercise: Other - see comments ([place] and  spends mornings and afternoon caring for her 7 horses and 3 dogs. ), Frequency (Times/Week): 7, Intensity: Mild   Diet (meal preparation, eat out, water intake, caffeinated beverages, dairy products, fruits and vegetables): 3 meals/day. Cooks 85% of meals at home. Drinks 2-3 glasses of water/day, more if it is hot. Pt states she feels like she eats a healthy diet. Snacks on popcorn. Drinks at least 1 diet coke or diet root beer/day.  Breakfast: Bagel and cream cheese. No coffee.  Lunch: Sandwich or cottage cheese and tomato or blueberries. Dinner: Same as lunch of hamburger.  Pt is starting up Nutrisystem again for the next two weeks. Then pt is travelling to see her mother for a month and then she will start Nutrisystem back. Pt lost 10 lbs on Nutrisystem last year.   Goals    . Lose 25 lbs.          Goal is 5 lb increments.       Fall Risk Fall Risk  12/15/2016 11/18/2016 10/25/2015 10/23/2014 10/16/2013  Falls in the past year? No No No No -  Risk for fall due to : - - - - History of fall(s)   Depression Screen PHQ 2/9 Scores 12/15/2016 11/18/2016 10/25/2015 10/23/2014  PHQ - 2 Score 0 0 0 0     Cognitive Function   Ad8 score reviewed for issues:  Issues making decisions:no  Less interest in hobbies / activities:no  Repeats questions, stories (family complaining):no  Trouble using ordinary gadgets (microwave, computer, phone):no  Forgets the month or year: no  Mismanaging finances: no  Remembering appts:no  Daily problems with thinking and/or memory:no Ad8 score is=0      Immunization History  Administered Date(s) Administered  . Influenza Split 04/13/2011  . Influenza, High Dose Seasonal PF 02/12/2015  . Pneumococcal Conjugate-13 10/16/2013, 10/23/2014  . Pneumococcal Polysaccharide-23 10/25/2015   Screening Tests Health Maintenance  Topic Date Due  . INFLUENZA VACCINE  01/13/2017  . TETANUS/TDAP  06/15/2018  . MAMMOGRAM  10/23/2018  . COLONOSCOPY  04/27/2026  . DEXA  SCAN  Completed  . Hepatitis C Screening  Completed  . PNA vac Low Risk Adult  Completed      Plan:   Follow up with PCP as directed.  Pt would like to know how soon she should get her pap smear. Preventive care suggests every 3 years but she thought she was told every 5 years.   Pt will schedule Bone Density exam that was ordered at her last visit.  I have personally reviewed and noted the following in the patient's chart:   . Medical and social history . Use of alcohol, tobacco or illicit drugs  . Current medications and supplements . Functional ability and status . Nutritional status . Physical activity . Advanced directives . List of other physicians . Vitals . Screenings to include cognitive, depression, and falls . Referrals and appointments  In addition, I have reviewed and discussed with patient certain preventive protocols, quality metrics, and best practice recommendations. A written personalized care plan for preventive services as well as general preventive health recommendations were provided to patient.     Richelle Itoassandra Albright, RN  12/15/2016    Above noted reviewed and agree. Lorretta HarpPANOSH,WANDA KOTVAN, MD

## 2016-12-14 NOTE — Progress Notes (Signed)
Pre visit review using our clinic review tool, if applicable. No additional management support is needed unless otherwise documented below in the visit note. 

## 2016-12-15 ENCOUNTER — Ambulatory Visit (INDEPENDENT_AMBULATORY_CARE_PROVIDER_SITE_OTHER): Payer: Medicare Other

## 2016-12-15 VITALS — BP 110/72 | HR 55 | Resp 16 | Ht 65.0 in | Wt 175.7 lb

## 2016-12-15 DIAGNOSIS — Z0000 Encounter for general adult medical examination without abnormal findings: Secondary | ICD-10-CM | POA: Diagnosis not present

## 2016-12-15 NOTE — Patient Instructions (Addendum)
Bring a copy of your advance directives to your next office visit. Schedule bone density exam.   Preventive Care 65 Years and Older, Female Preventive care refers to lifestyle choices and visits with your health care provider that can promote health and wellness. What does preventive care include?  A yearly physical exam. This is also called an annual well check.  Dental exams once or twice a year.  Routine eye exams. Ask your health care provider how often you should have your eyes checked.  Personal lifestyle choices, including: ? Daily care of your teeth and gums. ? Regular physical activity. ? Eating a healthy diet. ? Avoiding tobacco and drug use. ? Limiting alcohol use. ? Practicing safe sex. ? Taking low-dose aspirin every day. ? Taking vitamin and mineral supplements as recommended by your health care provider. What happens during an annual well check? The services and screenings done by your health care provider during your annual well check will depend on your age, overall health, lifestyle risk factors, and family history of disease. Counseling Your health care provider may ask you questions about your:  Alcohol use.  Tobacco use.  Drug use.  Emotional well-being.  Home and relationship well-being.  Sexual activity.  Eating habits.  History of falls.  Memory and ability to understand (cognition).  Work and work Statistician.  Reproductive health.  Screening You may have the following tests or measurements:  Height, weight, and BMI.  Blood pressure.  Lipid and cholesterol levels. These may be checked every 5 years, or more frequently if you are over 28 years old.  Skin check.  Lung cancer screening. You may have this screening every year starting at age 43 if you have a 30-pack-year history of smoking and currently smoke or have quit within the past 15 years.  Fecal occult blood test (FOBT) of the stool. You may have this test every year starting  at age 55.  Flexible sigmoidoscopy or colonoscopy. You may have a sigmoidoscopy every 5 years or a colonoscopy every 10 years starting at age 32.  Hepatitis C blood test.  Hepatitis B blood test.  Sexually transmitted disease (STD) testing.  Diabetes screening. This is done by checking your blood sugar (glucose) after you have not eaten for a while (fasting). You may have this done every 1-3 years.  Bone density scan. This is done to screen for osteoporosis. You may have this done starting at age 71.  Mammogram. This may be done every 1-2 years. Talk to your health care provider about how often you should have regular mammograms.  Talk with your health care provider about your test results, treatment options, and if necessary, the need for more tests. Vaccines Your health care provider may recommend certain vaccines, such as:  Influenza vaccine. This is recommended every year.  Tetanus, diphtheria, and acellular pertussis (Tdap, Td) vaccine. You may need a Td booster every 10 years.  Varicella vaccine. You may need this if you have not been vaccinated.  Zoster vaccine. You may need this after age 80.  Measles, mumps, and rubella (MMR) vaccine. You may need at least one dose of MMR if you were born in 1957 or later. You may also need a second dose.  Pneumococcal 13-valent conjugate (PCV13) vaccine. One dose is recommended after age 20.  Pneumococcal polysaccharide (PPSV23) vaccine. One dose is recommended after age 27.  Meningococcal vaccine. You may need this if you have certain conditions.  Hepatitis A vaccine. You may need this if you  have certain conditions or if you travel or work in places where you may be exposed to hepatitis A.  Hepatitis B vaccine. You may need this if you have certain conditions or if you travel or work in places where you may be exposed to hepatitis B.  Haemophilus influenzae type b (Hib) vaccine. You may need this if you have certain  conditions.  Talk to your health care provider about which screenings and vaccines you need and how often you need them. This information is not intended to replace advice given to you by your health care provider. Make sure you discuss any questions you have with your health care provider. Document Released: 06/28/2015 Document Revised: 02/19/2016 Document Reviewed: 04/02/2015 Elsevier Interactive Patient Education  2017 [employer].

## 2017-02-09 ENCOUNTER — Encounter: Payer: Self-pay | Admitting: Internal Medicine

## 2017-02-11 ENCOUNTER — Ambulatory Visit (INDEPENDENT_AMBULATORY_CARE_PROVIDER_SITE_OTHER): Payer: Medicare Other | Admitting: Internal Medicine

## 2017-02-11 DIAGNOSIS — J45909 Unspecified asthma, uncomplicated: Secondary | ICD-10-CM

## 2017-02-11 DIAGNOSIS — Z79899 Other long term (current) drug therapy: Secondary | ICD-10-CM | POA: Diagnosis not present

## 2017-02-11 LAB — PULMONARY FUNCTION TEST
DL/VA % pred: 97 %
DL/VA: 4.81 ml/min/mmHg/L
DLCO cor % pred: 81 %
DLCO cor: 20.77 ml/min/mmHg
DLCO unc % pred: 86 %
DLCO unc: 22.21 ml/min/mmHg
FEF 25-75 Post: 2.12 L/s
FEF 25-75 Pre: 1.87 L/s
FEF2575-%Change-Post: 13 %
FEF2575-%Pred-Post: 103 %
FEF2575-%Pred-Pre: 91 %
FEV1-%Change-Post: 1 %
FEV1-%Pred-Post: 87 %
FEV1-%Pred-Pre: 85 %
FEV1-Post: 2.11 L
FEV1-Pre: 2.08 L
FEV1FVC-%Change-Post: 1 %
FEV1FVC-%Pred-Pre: 105 %
FEV6-%Change-Post: 0 %
FEV6-%Pred-Post: 84 %
FEV6-%Pred-Pre: 84 %
FEV6-Post: 2.58 L
FEV6-Pre: 2.58 L
FEV6FVC-%Pred-Post: 104 %
FEV6FVC-%Pred-Pre: 104 %
FVC-%Change-Post: 0 %
FVC-%Pred-Post: 80 %
FVC-%Pred-Pre: 81 %
FVC-Post: 2.58 L
FVC-Pre: 2.58 L
Post FEV1/FVC ratio: 82 %
Post FEV6/FVC ratio: 100 %
Pre FEV1/FVC ratio: 80 %
Pre FEV6/FVC Ratio: 100 %
RV % pred: 119 %
RV: 2.64 L
TLC % pred: 101 %
TLC: 5.27 L

## 2017-02-11 NOTE — Progress Notes (Signed)
PFT done today. 

## 2017-02-12 ENCOUNTER — Other Ambulatory Visit: Payer: Self-pay | Admitting: Internal Medicine

## 2017-02-16 ENCOUNTER — Ambulatory Visit (INDEPENDENT_AMBULATORY_CARE_PROVIDER_SITE_OTHER)
Admission: RE | Admit: 2017-02-16 | Discharge: 2017-02-16 | Disposition: A | Discharge status: Home / Self Care | Payer: Medicare Other | Source: Ambulatory Visit | Attending: Internal Medicine | Admitting: Internal Medicine

## 2017-02-16 DIAGNOSIS — E2839 Other primary ovarian failure: Secondary | ICD-10-CM | POA: Diagnosis not present

## 2017-02-23 ENCOUNTER — Encounter: Payer: Self-pay | Admitting: Internal Medicine

## 2017-02-25 NOTE — Telephone Encounter (Signed)
Please inform patient  I do not see a final report yet  preliminary of the back appears normal If you haven't heard about final report in another week contact us again.

## 2017-03-05 ENCOUNTER — Encounter: Payer: Self-pay | Admitting: Internal Medicine

## 2017-03-29 ENCOUNTER — Ambulatory Visit (INDEPENDENT_AMBULATORY_CARE_PROVIDER_SITE_OTHER): Payer: Medicare Other | Admitting: *Deleted

## 2017-03-29 DIAGNOSIS — Z23 Encounter for immunization: Secondary | ICD-10-CM

## 2017-06-24 ENCOUNTER — Ambulatory Visit (INDEPENDENT_AMBULATORY_CARE_PROVIDER_SITE_OTHER): Payer: Medicare Other | Admitting: Internal Medicine

## 2017-06-24 ENCOUNTER — Encounter: Payer: Self-pay | Admitting: Internal Medicine

## 2017-06-24 VITALS — BP 120/78 | HR 54 | Ht 65.0 in | Wt 170.8 lb

## 2017-06-24 DIAGNOSIS — I483 Typical atrial flutter: Secondary | ICD-10-CM | POA: Diagnosis not present

## 2017-06-24 DIAGNOSIS — I10 Essential (primary) hypertension: Secondary | ICD-10-CM | POA: Diagnosis not present

## 2017-06-24 DIAGNOSIS — E782 Mixed hyperlipidemia: Secondary | ICD-10-CM

## 2017-06-24 LAB — CBC
HEMATOCRIT: 39 % (ref 34.0–46.6)
Hemoglobin: 13 g/dL (ref 11.1–15.9)
MCH: 28.1 pg (ref 26.6–33.0)
MCHC: 33.3 g/dL (ref 31.5–35.7)
MCV: 84 fL (ref 79–97)
Platelets: 210 10*3/uL (ref 150–379)
RBC: 4.62 x10E6/uL (ref 3.77–5.28)
RDW: 14.2 % (ref 12.3–15.4)
WBC: 5.7 10*3/uL (ref 3.4–10.8)

## 2017-06-24 LAB — BASIC METABOLIC PANEL
BUN / CREAT RATIO: 29 — AB (ref 12–28)
BUN: 16 mg/dL (ref 8–27)
CALCIUM: 9.6 mg/dL (ref 8.7–10.3)
CO2: 28 mmol/L (ref 20–29)
Chloride: 99 mmol/L (ref 96–106)
Creatinine, Ser: 0.56 mg/dL — ABNORMAL LOW (ref 0.57–1.00)
GFR, EST AFRICAN AMERICAN: 111 mL/min/{1.73_m2} (ref >59)
GFR, EST NON AFRICAN AMERICAN: 96 mL/min/{1.73_m2} (ref >59)
Glucose: 97 mg/dL (ref 65–99)
Potassium: 3.2 mmol/L — ABNORMAL LOW (ref 3.5–5.2)
Sodium: 144 mmol/L (ref 134–144)

## 2017-06-24 MED ORDER — BISOPROLOL FUMARATE 5 MG PO TABS: 5.0000 mg | ORAL_TABLET | Freq: Every day | ORAL | 3 refills | Status: DC

## 2017-06-24 NOTE — Progress Notes (Signed)
Cardiology Office Note   Date:  06/24/2017   ID:  Penny Barnett, DOB [birth year], MRN [id number]  PCP:  Madelin Headings, MD  Cardiologist:   Dietrich Pates, MD   F/U of PA flutter and HTN      History of Present Illness: Penny Barnett is a 67 y.o. female with a history of atrial flutter    S/p TEE Cardiovrsioin in 2016   I saw the pt in Jan 2018    Lipids in June 2018 LDL 77  HDL 59  Trig 191  Pt denies palpitations   She notes occasional dizzinesswith quick changes in position    Breathig is OK  Energy OK     Current Meds  Medication Sig  . bisoprolol (ZEBETA) 5 MG tablet TAKE 1 TABLET (5 MG TOTAL) BY MOUTH 2 (TWO) TIMES DAILY.  . cholecalciferol (VITAMIN D) 1000 UNITS tablet Take 1,000 Units by mouth daily.    . cyclobenzaprine (FLEXERIL) 5 MG tablet Take 1 tablet (5 mg total) by mouth 3 (three) times daily as needed for muscle spasms.  Marland Kitchen ELIQUIS 5 MG TABS tablet TAKE 1 TABLET (5 MG TOTAL) BY MOUTH 2 (TWO) TIMES DAILY.  Marland Kitchen FLOVENT HFA 110 MCG/ACT inhaler INHALE 1 PUFF TWICE DAILY  . hydrochlorothiazide (HYDRODIURIL) 25 MG tablet TAKE 1 TABLET EVERY DAY  . ipratropium (ATROVENT HFA) 17 MCG/ACT inhaler Inhale 2 puffs into the lungs 2 (two) times daily.  . IRON PO Take by mouth.  . lansoprazole (PREVACID) 15 MG capsule Take 15 mg by mouth daily at 12 noon.  . montelukast (SINGULAIR) 10 MG tablet TAKE 1 TABLET (10 MG TOTAL) BY MOUTH EVERY MORNING.  . Multiple Vitamins-Minerals (MULTIVITAMIN WITH MINERALS) tablet Take 1 tablet by mouth daily.    . potassium chloride SA (K-DUR,KLOR-CON) 20 MEQ tablet Take 1 tablet (20 mEq total) by mouth daily.  . rosuvastatin (CRESTOR) 10 MG tablet Take 5 mg by mouth daily. Taking every other day to avoid side effects   . theophylline (THEODUR) 300 MG 12 hr tablet Take 300 mg by mouth daily.   . VENTOLIN HFA 108 (90 Base) MCG/ACT inhaler INHALE 2 PUFFS INTO THE LUNGS EVERY 6 HOURS AS NEEDED FOR WHEEZING OR SHORTNESS OF BREATH.   Current  Facility-Administered Medications for the 06/24/17 encounter (Office Visit) with Pricilla Riffle, MD  Medication  . 0.9 %  sodium chloride infusion     Allergies:   Penicillins   Past Medical History:  Diagnosis Date  . Allergy   . Anemia   . Arthritis    hands  . Asthma    as a child and then attack 1996  poss allergic to cat hair. ventilated x 2 . saw Dr [place] Callas then .   Marland Kitchen Atrial flutter (HCC)    a. 12/2014 Echo: EF 60-65%, no rwma, mild MR;  b. S/P TEE/DCCV;  c. CHA2DS2VASc = 3-->Eliquis.  . Cataract    Had surgery to remove  . GERD (gastroesophageal reflux disease)    2000 taking otc prevacid   evey day.   . Hyperlipidemia   . Hypertension     3 meds controlled  . Kidney stone    passed stone - no surgery required    Past Surgical History:  Procedure Laterality Date  . CARDIOVERSION N/A 01/03/2015   Procedure: CARDIOVERSION;  Surgeon: Pricilla Riffle, MD;  Location[facility] ENDOSCOPY;  Service: Cardiovascular;  Laterality: N/A;  . CATARACT EXTRACTION Bilateral 10/14/2010   both eyes  .  COLONOSCOPY  02/2011   Arlyce DiceKaplan  . SHOULDER SURGERY Bilateral    x 2 - frozen shoulder rt 2006, 2000 left   . TEE WITHOUT CARDIOVERSION N/A 01/03/2015   Procedure: TRANSESOPHAGEAL ECHOCARDIOGRAM (TEE);  Surgeon: Pricilla RifflePaula Fairley Copher V, MD;  [facility];  Service: Cardiovascular;  Laterality: N/A;  . TONSILLECTOMY       Social History:  The patient  reports that she quit smoking about 31 years ago. Her smoking use included cigarettes. She has a 20.00 pack-year smoking history. she has never used smokeless tobacco. She reports that she does not drink alcohol or use drugs.   Family History:  The patient's family history includes Alcohol abuse in her father; Bladder Cancer in her mother; Breast cancer in her maternal grandmother and mother; Uterine cancer in her sister.    ROS:  Please see the history of present illness. All other systems are reviewed and  Negative to the above problem except as  noted.    PHYSICAL EXAM: VS:  BP 120/78   Pulse (!) 54   Ht 5\' 5"  (1.651 m)   Wt 170 lb 12.8 oz (77.5 kg)   BMI 28.42 kg/m   GEN: Well nourished, well developed, in no acute distress  HEENT: normal  Neck: JVP normal  , carotid bruits, or masses Cardiac: RRR; no murmurs, rubs, or gallops,no edema  Respiratory:  clear to auscultation bilaterally, normal work of breathing GI: soft, nontender, nondistended, + BS  No hepatomegaly  MS: no deformity Moving all extremities   Skin: warm and dry, no rash Neuro:  Strength and sensation are intact Psych: euthymic mood, full affect   EKG:  EKG is ordered today. Possible ectopic atrial rhythm  55 bpm     Lipid Panel    Component Value Date/Time   CHOL 174 11/18/2016 1130   TRIG 191.0 (H) 11/18/2016 1130   HDL 59.00 11/18/2016 1130   CHOLHDL 3 11/18/2016 1130   VLDL 38.2 11/18/2016 1130   LDLCALC 77 11/18/2016 1130   LDLDIRECT 159.2 10/07/2012 0813      Wt Readings from Last 3 Encounters:  06/24/17 170 lb 12.8 oz (77.5 kg)  12/15/16 175 lb 11.2 oz (79.7 kg)  11/18/16 176 lb 14.4 oz (80.2 kg)      ASSESSMENT AND PLAN:  1   Atrial flutter  No recurrence clinically  Keep on Eliquis  HR is low  Can try to cut back on bisoprolol to daily  Follow BP, HR and symtpoms     2  HL  Lpids are good  Keep on Crestor  LDL 77  3  HTN  BP is well controlled  Follow with changes as noted   Get BMET and CBC today  F/U in clinic in 12 months   Current medicines are reviewed at length with the patient today.  The patient does not have concerns regarding medicines.  Signed, Dietrich PatesPaula Draken Farrior, MD  06/24/2017 8:48 AM    [facility] [address], [place], [place]  [address] Phone: [phone]; Fax: [phone]

## 2017-06-24 NOTE — Patient Instructions (Signed)
Your physician has recommended you make the following change in your medication:   1.) decrease bisoprolol to 5mg  --once a day Keep track of blood pressure.  You can use MyChart or call to update Dr. Tenny Crawoss on blood pressure and heart rate  Your physician recommends that you return for lab work today (cbc, bmet)  Your physician wants you to follow-up in: 1 year with Dr. Tenny Crawoss.  You will receive a reminder letter in the mail two months in advance. If you don't receive a letter, please call our office to schedule the follow-up appointment.

## 2017-06-25 ENCOUNTER — Telehealth: Payer: Self-pay | Admitting: *Deleted

## 2017-06-25 DIAGNOSIS — I10 Essential (primary) hypertension: Secondary | ICD-10-CM

## 2017-06-25 MED ORDER — TRIAMTERENE-HCTZ 37.5-25 MG PO TABS: 0.5000 | ORAL_TABLET | Freq: Every day | ORAL | 3 refills | Status: DC

## 2017-06-25 NOTE — Telephone Encounter (Signed)
-----   Message from Dietrich PatesPaula Ross V, MD sent at 06/24/2017 11:24 PM EST ----- CBC is normal Potassium is low  3.2   I would switch her from HCTZ to Maxzide  1/2 or 37.5/25 Continue other meds Check BMET in 7 to 10 days Take extra potassium x 1

## 2017-06-25 NOTE — Telephone Encounter (Signed)
Spoke with patient. She will stop hctz and pick up and start Maxzide 1/2 of 37.5/25 tomorrow. She will take one extra potassium today. BMET scheduled for 07/02/17.

## 2017-07-02 ENCOUNTER — Other Ambulatory Visit: Payer: Medicare Other | Admitting: *Deleted

## 2017-07-02 DIAGNOSIS — I10 Essential (primary) hypertension: Secondary | ICD-10-CM | POA: Diagnosis not present

## 2017-07-02 LAB — BASIC METABOLIC PANEL
BUN / CREAT RATIO: 34 — AB (ref 12–28)
BUN: 23 mg/dL (ref 8–27)
CO2: 23 mmol/L (ref 20–29)
CREATININE: 0.67 mg/dL (ref 0.57–1.00)
Calcium: 9.8 mg/dL (ref 8.7–10.3)
Chloride: 99 mmol/L (ref 96–106)
GFR calc Af Amer: 104 mL/min/{1.73_m2} (ref >59)
GFR calc non Af Amer: 91 mL/min/{1.73_m2} (ref >59)
GLUCOSE: 95 mg/dL (ref 65–99)
Potassium: 3.8 mmol/L (ref 3.5–5.2)
SODIUM: 140 mmol/L (ref 134–144)

## 2017-08-12 ENCOUNTER — Other Ambulatory Visit: Payer: Self-pay | Admitting: Internal Medicine

## 2017-09-20 ENCOUNTER — Other Ambulatory Visit: Payer: Self-pay | Admitting: Internal Medicine

## 2017-09-20 DIAGNOSIS — Z1231 Encounter for screening mammogram for malignant neoplasm of breast: Secondary | ICD-10-CM

## 2017-10-26 ENCOUNTER — Ambulatory Visit
Admission: RE | Admit: 2017-10-26 | Discharge: 2017-10-26 | Disposition: A | Discharge status: Home / Self Care | Payer: Medicare Other | Source: Ambulatory Visit | Attending: Internal Medicine | Admitting: Internal Medicine

## 2017-10-26 DIAGNOSIS — Z1231 Encounter for screening mammogram for malignant neoplasm of breast: Secondary | ICD-10-CM | POA: Diagnosis not present

## 2017-11-17 ENCOUNTER — Other Ambulatory Visit: Payer: Self-pay | Admitting: Internal Medicine

## 2017-11-18 NOTE — Telephone Encounter (Signed)
Pt last saw Dr Tenny Crawoss 06/24/17, last labs 07/02/17 Creat 0.67, age 67, weight 77.5kg, based on specified criteria pt is on appropriate dosage of Eliquis 5mg  BID.  Will refill rx.

## 2017-11-24 ENCOUNTER — Ambulatory Visit (INDEPENDENT_AMBULATORY_CARE_PROVIDER_SITE_OTHER): Payer: Medicare Other | Admitting: Internal Medicine

## 2017-11-24 ENCOUNTER — Encounter: Payer: Self-pay | Admitting: Internal Medicine

## 2017-11-24 VITALS — BP 107/69 | HR 54 | Temp 97.9°F | Ht 65.0 in | Wt 172.0 lb

## 2017-11-24 DIAGNOSIS — M19042 Primary osteoarthritis, left hand: Secondary | ICD-10-CM | POA: Diagnosis not present

## 2017-11-24 DIAGNOSIS — J45909 Unspecified asthma, uncomplicated: Secondary | ICD-10-CM | POA: Diagnosis not present

## 2017-11-24 DIAGNOSIS — Z7901 Long term (current) use of anticoagulants: Secondary | ICD-10-CM | POA: Diagnosis not present

## 2017-11-24 DIAGNOSIS — M19041 Primary osteoarthritis, right hand: Secondary | ICD-10-CM

## 2017-11-24 DIAGNOSIS — Z79899 Other long term (current) drug therapy: Secondary | ICD-10-CM

## 2017-11-24 DIAGNOSIS — E785 Hyperlipidemia, unspecified: Secondary | ICD-10-CM | POA: Diagnosis not present

## 2017-11-24 LAB — HEPATIC FUNCTION PANEL
ALT: 20 U/L (ref 0–35)
AST: 17 U/L (ref 0–37)
Albumin: 4.4 g/dL (ref 3.5–5.2)
Alkaline Phosphatase: 64 U/L (ref 39–117)
Bilirubin, Direct: 0.1 mg/dL (ref 0.0–0.3)
Total Bilirubin: 0.5 mg/dL (ref 0.2–1.2)
Total Protein: 6.9 g/dL (ref 6.0–8.3)

## 2017-11-24 LAB — LIPID PANEL
Cholesterol: 164 mg/dL (ref 0–200)
HDL: 58.4 mg/dL
LDL Cholesterol: 81 mg/dL (ref 0–99)
NonHDL: 105.79
Total CHOL/HDL Ratio: 3
Triglycerides: 126 mg/dL (ref 0.0–149.0)
VLDL: 25.2 mg/dL (ref 0.0–40.0)

## 2017-11-24 LAB — BASIC METABOLIC PANEL WITH GFR
BUN: 16 mg/dL (ref 6–23)
CO2: 29 meq/L (ref 19–32)
Calcium: 9.5 mg/dL (ref 8.4–10.5)
Chloride: 102 meq/L (ref 96–112)
Creatinine, Ser: 0.58 mg/dL (ref 0.40–1.20)
GFR: 109.57 mL/min
Glucose, Bld: 86 mg/dL (ref 70–99)
Potassium: 3.7 meq/L (ref 3.5–5.1)
Sodium: 141 meq/L (ref 135–145)

## 2017-11-24 LAB — CBC WITH DIFFERENTIAL/PLATELET
Basophils Absolute: 0 10*3/uL (ref 0.0–0.1)
Basophils Relative: 0.6 % (ref 0.0–3.0)
Eosinophils Absolute: 0.1 10*3/uL (ref 0.0–0.7)
Eosinophils Relative: 1.4 % (ref 0.0–5.0)
HCT: 39.6 % (ref 36.0–46.0)
Hemoglobin: 13.4 g/dL (ref 12.0–15.0)
Lymphocytes Relative: 20.1 % (ref 12.0–46.0)
Lymphs Abs: 1.2 10*3/uL (ref 0.7–4.0)
MCHC: 33.8 g/dL (ref 30.0–36.0)
MCV: 85.8 fl (ref 78.0–100.0)
Monocytes Absolute: 0.4 10*3/uL (ref 0.1–1.0)
Monocytes Relative: 7.6 % (ref 3.0–12.0)
Neutro Abs: 4.1 10*3/uL (ref 1.4–7.7)
Neutrophils Relative %: 70.3 % (ref 43.0–77.0)
Platelets: 201 10*3/uL (ref 150.0–400.0)
RBC: 4.62 Mil/uL (ref 3.87–5.11)
RDW: 13.8 % (ref 11.5–15.5)
WBC: 5.8 10*3/uL (ref 4.0–10.5)

## 2017-11-24 LAB — TSH: TSH: 0.66 u[IU]/mL (ref 0.35–4.50)

## 2017-11-24 LAB — URIC ACID: Uric Acid, Serum: 5.3 mg/dL (ref 2.4–7.0)

## 2017-11-24 LAB — C-REACTIVE PROTEIN: CRP: 0.6 mg/dL (ref 0.5–20.0)

## 2017-11-24 NOTE — Patient Instructions (Signed)
Can try  weaning and going  off the theodur    All together as long as taking your  Control inhalers.   Can get x ray of  Hand fingers when convenient   To help delineate  Type of arthritis .   Will notify you  of labs when available. shingrix vaccine when   Convenient at your pharmacy .   Yearly viist    But if hand  Problems getting worse we could have you see a hand specialist .

## 2017-11-24 NOTE — Progress Notes (Signed)
Chief Complaint  Patient presents with  . Medication Management  . Follow-up    HPI: Kalle A Un 67 y.o. comes in today for yearly exam and med eval .Since last visit.  Pulmonary: asthma   Has tried 1/2 dose  theodur and no bad effect  On reg inhalers  cv ": bp a flutter meds  HLD:  No se of meds  HT flutter decreasing bisoprolol  No falling   Or depression  ate cottage  Cheese today but  r index finger and less left deformity ?is thi arthritis ? Some pain but no severe  No redness  No fam hx of same  Anticoagulation no concerns  To visit mom is [place]  Soon driving   Health Maintenance  Topic Date Due  . INFLUENZA VACCINE  01/13/2018  . TETANUS/TDAP  06/15/2018  . MAMMOGRAM  10/27/2019  . COLONOSCOPY  04/27/2026  . DEXA SCAN  Completed  . Hepatitis C Screening  Completed  . PNA vac Low Risk Adult  Completed   Health Maintenance Review LIFESTYLE:  Exercise:   Walks and  Work on farm Tobacco/ETS: no Alcohol:  no Sugar beverages: Sleep: 7-8  Drug use: no HH:  1  Sold horse  Dogs 2  ROS:  GEN/ HEENT: No fever, significant weight changes sweats headaches vision problems hearing changes, CV/ PULM; No chest pain shortness of breath cough, syncope,edema  change in exercise tolerance. GI /GU: No adominal pain, vomiting, change in bowel habits. No blood in the stool. No significant GU symptoms. SKIN/HEME: ,no acute skin rashes suspicious lesions or bleeding. No lymphadenopathy, nodules, masses.  NEURO/ PSYCH:  No neurologic signs such as weakness numbness. No depression anxiety. IMM/ Allergy: No unusual infections.  Allergy .   REST of 12 system review negative except as per HPI   Past Medical History:  Diagnosis Date  . Allergy   . Anemia   . Arthritis    hands  . Asthma    as a child and then attack 1996  poss allergic to cat hair. ventilated x 2 . saw Dr [place] Callas then .   Marland Kitchen Atrial flutter (HCC)    a. 12/2014 Echo: EF 60-65%, no rwma, mild MR;  b. S/P  TEE/DCCV;  c. CHA2DS2VASc = 3-->Eliquis.  . Cataract    Had surgery to remove  . GERD (gastroesophageal reflux disease)    2000 taking otc prevacid   evey day.   . Hyperlipidemia   . Hypertension     3 meds controlled  . Kidney stone    passed stone - no surgery required    Family History  Problem Relation Age of Onset  . Breast cancer Mother   . Bladder Cancer Mother   . Uterine cancer Sister   . Alcohol abuse Father   . Breast cancer Maternal Grandmother   . Colon cancer Neg Hx   . Heart attack Neg Hx   . Stroke Neg Hx   . Hypertension Neg Hx   . Esophageal cancer Neg Hx   . Rectal cancer Neg Hx   . Stomach cancer Neg Hx     Social History   Socioeconomic History  . Marital status: Single    Spouse name: Not on file  . Number of children: Not on file  . Years of education: Not on file  . Highest education level: Not on file  Occupational History  . Occupation: CSR  Social Needs  . Financial resource strain: Not on file  .  Food insecurity:    Worry: Not on file    Inability: Not on file  . Transportation needs:    Medical: Not on file    Non-medical: Not on file  Tobacco Use  . Smoking status: Former Smoker    Packs/day: 1.00    Years: 20.00    Pack years: 20.00    Types: Cigarettes    Last attempt to quit: 06/21/1986    Years since quitting: 31.4  . Smokeless tobacco: Never Used  Substance and Sexual Activity  . Alcohol use: No    Alcohol/week: 0.0 oz  . Drug use: No  . Sexual activity: Not on file  Lifestyle  . Physical activity:    Days per week: Not on file    Minutes per session: Not on file  . Stress: Not on file  Relationships  . Social connections:    Talks on phone: Not on file    Gets together: Not on file    Attends religious service: Not on file    Active member of club or organization: Not on file    Attends meetings of clubs or organizations: Not on file    Relationship status: Not on file  Other Topics Concern  . 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    HS education  single   6 hours of sleep   Hh  Of 1  Pet dog and a horse.    Remote tobacco  1-2 ppd stopped 1988    Etoh couple times a month rare    ocass  Diet soda.              Outpatient Encounter Medications as of 11/24/2017  Medication Sig  . bisoprolol (ZEBETA) 5 MG tablet Take 1 tablet (5 mg total) by mouth daily.  . cholecalciferol (VITAMIN D) 1000 UNITS tablet Take 1,000 Units by mouth daily.    . cyclobenzaprine (FLEXERIL) 5 MG tablet Take 1 tablet (5 mg total) by mouth 3 (three) times daily as needed for muscle spasms.  Marland Kitchen ELIQUIS 5 MG TABS tablet TAKE 1 TABLET  TWO TIMES DAILY.  Marland Kitchen FLOVENT HFA 110 MCG/ACT inhaler INHALE 1 PUFF TWICE DAILY  . ipratropium (ATROVENT HFA) 17 MCG/ACT inhaler Inhale 2 puffs into the lungs 2 (two) times daily.  . IRON PO Take by mouth.  . lansoprazole (PREVACID) 15 MG capsule Take 15 mg by mouth daily at 12 noon.  . montelukast (SINGULAIR) 10 MG tablet TAKE 1 TABLET (10 MG TOTAL) BY MOUTH EVERY MORNING.  . Multiple Vitamins-Minerals (MULTIVITAMIN WITH MINERALS) tablet Take 1 tablet by mouth daily.    . potassium chloride SA (K-DUR,KLOR-CON) 20 MEQ tablet Take 1 tablet (20 mEq total) by mouth daily.  . rosuvastatin (CRESTOR) 10 MG tablet Take 5 mg by mouth daily.   . theophylline (THEODUR) 300 MG 12 hr tablet Take 300 mg by mouth daily.   Marland Kitchen triamterene-hydrochlorothiazide (MAXZIDE-25) 37.5-25 MG tablet Take 0.5 tablets by mouth daily.  . VENTOLIN HFA 108 (90 Base) MCG/ACT inhaler INHALE 2 PUFFS INTO THE LUNGS EVERY 6 HOURS AS NEEDED FOR WHEEZING OR SHORTNESS OF BREATH.  . [DISCONTINUED] triamterene-hydrochlorothiazide (MAXZIDE-25) 37.5-25 MG tablet Take 0.5 tablets by mouth daily. (Patient not taking: Reported on 11/24/2017)  Facility-Administered Encounter Medications as  of 11/24/2017  Medication  . 0.9 %  sodium chloride infusion    EXAM:  BP 107/69   Pulse (!) 54   Temp 97.9 F (36.6 C)   Ht 5\' 5"  (1.651 m)   Wt 172 lb (78 kg)   BMI 28.62 kg/m   Body mass index is 28.62 kg/m.  Physical Exam: Vital signs reviewed EAV:[id number]:This is a well-developed well-nourished alert cooperative   who appears stated age in no acute distress.  HEENT: normocephalic atraumatic , Eyes: PERRL EOM's full, conjunctiva clear, Nares: paten,t no deformity discharge or tenderness., Ears: no deformity EAC's clear TMs with normal landmarks. Mouth: clear OP, no lesions, edema.  Moist mucous membranes. Dentition in adequate repair. NECK: supple without masses, thyromegaly or bruits. CHEST/PULM:  Clear to auscultation and percussion breath sounds equal no wheeze , rales or rhonchi. No chest wall deformities or tenderness.Breast: normal by inspection . No dimpling, discharge, masses, tenderness or discharge . CV: PMI is nondisplaced, S1 S2 no gallops, murmurs, rubs. Peripheral pulses arewithout delay.No JVD .  ABDOMEN: Bowel sounds normal nontender  No guard or rebound, no hepato splenomegal no CVA tenderness.   Extremtities:  No clubbing cyanosis or edema,righ index is fusiform swelling without redness  Some dec rom some deformity   Left in dex left so but other normal  NEURO:  Oriented x3, cranial nerves 3-12 appear to be intact, no obvious focal weakness,gait within normal limits no abnormal reflexes or asymmetrical SKIN: No acute rashes normal turgor, color, no bruising or petechiae. PSYCH: Oriented, good eye contact, no obvious depression anxiety, cognition and judgment appear normal. LN: no cervical axillary i adenopathy No noted deficits in memory, attention, and speech.   Lab Results  Component Value Date   WBC 5.8 11/24/2017   HGB 13.4 11/24/2017   HCT 39.6 11/24/2017   PLT 201.0 11/24/2017   GLUCOSE 86 11/24/2017   CHOL 164 11/24/2017   TRIG 126.0 11/24/2017   HDL  58.40 11/24/2017   LDLDIRECT 159.2 10/07/2012   LDLCALC 81 11/24/2017   ALT 20 11/24/2017   AST 17 11/24/2017   NA 141 11/24/2017   K 3.7 11/24/2017   CL 102 11/24/2017   CREATININE 0.58 11/24/2017   BUN 16 11/24/2017   CO2 29 11/24/2017   TSH 0.66 11/24/2017    ASSESSMENT AND PLAN:  Discussed the following assessment and plan:  Arthritis of finger of both hands - Plan: Basic metabolic panel, CBC with Differential/Platelet, Hepatic function panel, Lipid panel, TSH, Uric Acid, C-reactive protein, DG Hand Complete Right  Asthma, unspecified asthma severity, unspecified whether complicated, unspecified whether persistent - Plan: Basic metabolic panel, CBC with Differential/Platelet, Hepatic function panel, Lipid panel, TSH, Uric Acid, C-reactive protein  Hyperlipidemia, unspecified hyperlipidemia type - Plan: Basic metabolic panel, CBC with Differential/Platelet, Hepatic function panel, Lipid panel, TSH, Uric Acid, C-reactive protein  Medication management - Plan: Basic metabolic panel, CBC with Differential/Platelet, Hepatic function panel, Lipid panel, TSH, Uric Acid, C-reactive protein  Chronic anticoagulation - Plan: Basic metabolic panel, CBC with Differential/Platelet, Hepatic function panel, Lipid panel, TSH, Uric Acid, C-reactive protein Wellness with susan  In July  Lab monitoring try weaning the theodur and stopping if ok  Get x ray finger  But poss oa localized .  Expectant management.  Patient Care Team: Panosh, Neta MendsWanda K, MD as PCP - General (Internal Medicine) Nita SellsHall, John, MD (Dermatology) Pricilla Riffleoss, Paula V, MD as Consulting Physician (Cardiology) Myrtie Neitheranis, Andreas BlowerHenry L III, MD as Consulting Physician (  Gastroenterology)  Patient Instructions  Can try  weaning and going  off the theodur    All together as long as taking your  Control inhalers.   Can get x ray of  Hand fingers when convenient   To help delineate  Type of arthritis .   Will notify you  of labs when  available. shingrix vaccine when   Convenient at your pharmacy .   Yearly viist    But if hand  Problems getting worse we could have you see a hand specialist .     Neta Mends. Panosh M.D.

## 2017-12-17 ENCOUNTER — Ambulatory Visit: Payer: Medicare Other

## 2017-12-27 ENCOUNTER — Telehealth: Payer: Self-pay

## 2017-12-27 NOTE — Telephone Encounter (Signed)
Call to the patient to reschedule her apt on Wed the 17th at 3pm to another time due to a provider apt. To call back on my direct line [phone]  Shauck RN

## 2017-12-28 NOTE — Progress Notes (Addendum)
Subjective:   Penny Barnett is a 67 y.o. female who presents for Medicare Annual (Subsequent) preventive examination.  Reports health as fine  Seen by Dr. 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 ), Frequency (Times/Week): 7, Intensity: Mild   Diet (meal preparation, eat out, water intake, caffeinated beverages, dairy products, fruits and vegetables): 3 meals/day. Cooks 85% of meals at home. Drinks 2-3 glasses of water/day, more if it is hot. Pt states she feels like she eats a healthy diet. Snacks on popcorn.  Breakfast: Bagel and cream cheese. No coffee.  Lunch: Sandwich or cottage cheese and tomato or blueberries. Dinner: Same as lunch of hamburger.   Pt is starting up Nutrisystem again for the next two weeks. Then pt is travelling to see her mother for a month and then she will start Nutrisystem back. Pt lost 10 lbs on Nutrisystem last year.  Lost weight; will go back to nutri system    Sleep patterns: 6-7 hrs/night. Gets up once to use restroom. Sometimes gets up feeling rested. Sometimes still feels tired.  There are no preventive care reminders to display for this patient.   Pap 10/2012  Mammogram 10/26/2017  Dexa; 02/16/2017 -0.7- had not changed any   CCS 04/2016  Cardiac Risk Factors include: advanced age (>98men, >15 women);dyslipidemia;family history of premature cardiovascular disease;hypertension     Objective:     Vitals: BP 118/72   Pulse 62   Ht 5\' 4"  (1.626 m)   Wt 168 lb (76.2 kg)   SpO2 97%   BMI 28.84 kg/m   Body mass index is 28.84 kg/m.  Advanced Directives 12/29/2017 12/15/2016 04/22/2016  12/30/2014 12/30/2014  Does Patient Have a Medical Advance Directive? Yes No No No No  Would patient like information on creating a medical advance directive? - No - Patient declined - No - patient declined information No - patient declined information    Tobacco Social History   Tobacco Use  Smoking Status Former Smoker  . Packs/day: 1.00  . Years: 20.00  . Pack years: 20.00  . Types: Cigarettes  . Last attempt to quit: 06/21/1986  . Years since quitting: 31.5  Smokeless Tobacco Never Used     Counseling given: Yes   Clinical Intake:      Past Medical History:  Diagnosis Date  . Allergy   . Anemia   . Arthritis    hands  . Asthma    as a child and then attack 1996  poss allergic to cat hair. ventilated x 2 . saw Dr Benton Callas then .   Marland Kitchen Atrial flutter (HCC)    a. 12/2014 Echo: EF 60-65%, no rwma, mild MR;  b. S/P TEE/DCCV;  c. CHA2DS2VASc = 3-->Eliquis.  . Cataract    Had surgery to remove  . GERD (gastroesophageal reflux disease)    2000 taking otc prevacid   evey day.   . Hyperlipidemia   . Hypertension     3 meds controlled  . Kidney stone    passed stone - no surgery required   Past Surgical History:  Procedure Laterality  Date  . CARDIOVERSION N/A 01/03/2015   Procedure: CARDIOVERSION;  Surgeon: Pricilla Riffle, MD;  Location: [facility] ENDOSCOPY;  Service: Cardiovascular;  Laterality: N/A;  . CATARACT EXTRACTION Bilateral 10/14/2010   both eyes  . COLONOSCOPY  02/2011   Arlyce Dice  . SHOULDER SURGERY Bilateral    x 2 - frozen shoulder rt 2006, 2000 left   . TEE WITHOUT CARDIOVERSION N/A 01/03/2015   Procedure: TRANSESOPHAGEAL ECHOCARDIOGRAM (TEE);  Surgeon: Pricilla Riffle, MD;  Location: [facility] ENDOSCOPY;  Service: Cardiovascular;  Laterality: N/A;  . TONSILLECTOMY     Family History  Problem Relation Age of Onset  . Breast cancer Mother   . Bladder Cancer Mother   . Uterine cancer Sister   . Alcohol abuse Father   . Breast cancer Maternal Grandmother   . Colon cancer Neg Hx    . Heart attack Neg Hx   . Stroke Neg Hx   . Hypertension Neg Hx   . Esophageal cancer Neg Hx   . Rectal cancer Neg Hx   . Stomach cancer Neg Hx    Social History   Socioeconomic History  . Marital status: Single    Spouse name: Not on file  . Number of children: Not on file  . Years of education: Not on file  . Highest education level: Not on file  Occupational History  . Occupation: CSR  Social Needs  . Financial resource strain: Not on file  . Food insecurity:    Worry: Not on file    Inability: Not on file  . Transportation needs:    Medical: Not on file    Non-medical: Not on file  Tobacco Use  . Smoking status: Former Smoker    Packs/day: 1.00    Years: 20.00    Pack years: 20.00    Types: Cigarettes    Last attempt to quit: 06/21/1986    Years since quitting: 31.5  . Smokeless tobacco: Never Used  Substance and Sexual Activity  . Alcohol use: No    Alcohol/week: 0.0 oz  . Drug use: No  . Sexual activity: Not on file  Lifestyle  . Physical activity:    Days per week: Not on file    Minutes per session: Not on file  . Stress: Not on file  Relationships  . Social connections:    Talks on phone: Not on file    Gets together: Not on file    Attends religious service: Not on file    Active member of club or organization: Not on file    Attends meetings of clubs or organizations: Not on file    Relationship status: Not on file  Other Topics Concern  . 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    HS education  single   6 hours of sleep   Hh  Of 1  Pet dog and a horse.    Remote tobacco  1-2 ppd stopped 1988    Etoh couple times a month rare    ocass  Diet soda.              Outpatient Encounter Medications as of 12/29/2017  Medication Sig  . bisoprolol (ZEBETA) 5 MG tablet Take 1 tablet (5 mg total) by mouth  daily.  . cholecalciferol (VITAMIN D) 1000 UNITS tablet Take 1,000 Units by mouth daily.    . cyclobenzaprine (FLEXERIL) 5 MG tablet Take 1 tablet (5 mg total) by mouth 3 (three) times daily as needed for muscle spasms.  Marland Kitchen ELIQUIS 5 MG TABS tablet TAKE 1 TABLET  TWO TIMES DAILY.  Marland Kitchen FLOVENT HFA 110 MCG/ACT inhaler INHALE 1 PUFF TWICE DAILY  . ipratropium (ATROVENT HFA) 17 MCG/ACT inhaler Inhale 2 puffs into the lungs 2 (two) times daily.  . IRON PO Take by mouth.  . lansoprazole (PREVACID) 15 MG capsule Take 15 mg by mouth daily at 12 noon.  . montelukast (SINGULAIR) 10 MG tablet TAKE 1 TABLET (10 MG TOTAL) BY MOUTH EVERY MORNING.  . Multiple Vitamins-Minerals (MULTIVITAMIN WITH MINERALS) tablet Take 1 tablet by mouth daily.    . potassium chloride SA (K-DUR,KLOR-CON) 20 MEQ tablet Take 1 tablet (20 mEq total) by mouth daily.  . rosuvastatin (CRESTOR) 10 MG tablet Take 5 mg by mouth daily.   . theophylline (THEODUR) 300 MG 12 hr tablet Take 300 mg by mouth daily.   Marland Kitchen triamterene-hydrochlorothiazide (MAXZIDE-25) 37.5-25 MG tablet Take 0.5 tablets by mouth daily.  . VENTOLIN HFA 108 (90 Base) MCG/ACT inhaler INHALE 2 PUFFS INTO THE LUNGS EVERY 6 HOURS AS NEEDED FOR WHEEZING OR SHORTNESS OF BREATH.   Facility-Administered Encounter Medications as of 12/29/2017  Medication  . 0.9 %  sodium chloride infusion    Activities of Daily Living In your present state of health, do you have any difficulty performing the following activities: 12/29/2017  Hearing? N  Vision? N  Difficulty concentrating or making decisions? N  Walking or climbing stairs? N  Comment knee will hurt at times  Dressing or bathing? N  Doing errands, shopping? N  Preparing Food and eating ? N  Using the Toilet? N  In the past six months, have you accidently leaked urine? N  Do you have problems with loss of bowel control? N  Managing your Medications? N  Managing your Finances? N  Housekeeping or managing your  Housekeeping? N  Some recent data might be hidden    Patient Care Team: Panosh, Neta Mends, MD as PCP - General (Internal Medicine) Nita Sells, MD (Dermatology) Pricilla Riffle, MD as Consulting Physician (Cardiology) Myrtie Neither Andreas Blower, MD as Consulting Physician (Gastroenterology)    Assessment:   This is a routine wellness examination for Dynasia.  Exercise Activities and Dietary recommendations Current Exercise Habits: Home exercise routine, Time (Minutes): 60, Frequency (Times/Week): 5, Weekly Exercise (Minutes/Week): 300, Intensity: Moderate(walks a lot )  Goals    . Patient Stated     Will maintain this year  Would like to lose a few more weight Would like to get down below 150; Does not eat bread, potatoes etc Diet mayo        Fall Risk Fall Risk  12/29/2017 12/15/2016 11/18/2016 10/25/2015 10/23/2014  Falls in the past year? No No No No No  Risk for fall due to : - - - - -     Depression Screen PHQ 2/9 Scores 12/29/2017 12/15/2016 11/18/2016 10/25/2015  PHQ - 2 Score 0 0 0 0     Cognitive Function MMSE - Mini Mental State Exam 12/29/2017  Not completed: (No Data)     Ad8 score reviewed for issues:  Issues making decisions:  Less interest in hobbies / activities:  Repeats questions, stories (family complaining):  Trouble using ordinary gadgets (microwave, computer, phone):  Forgets the  month or year:   Mismanaging finances:   Remembering appts:  Daily problems with thinking and/or memory: Ad8 score is=0        Immunization History  Administered Date(s) Administered  . Influenza Split 04/13/2011  . Influenza, High Dose Seasonal PF 02/12/2015  . Influenza,inj,Quad PF,6+ Mos 03/29/2017  . Pneumococcal Conjugate-13 10/16/2013, 10/23/2014  . Pneumococcal Polysaccharide-23 10/25/2015      Screening Tests Health Maintenance  Topic Date Due  . INFLUENZA VACCINE  01/13/2018  . TETANUS/TDAP  06/15/2018  . MAMMOGRAM  10/27/2019  . COLONOSCOPY  04/27/2026    . DEXA SCAN  Completed  . Hepatitis C Screening  Completed  . PNA vac Low Risk Adult  Completed        Plan:      PCP Notes   Health Maintenance Pap 10/2012- generally not done after 65 unless there are risks  Mammogram 10/26/2017  Dexa; 02/16/2017 -0.7- had not changed any   CCS 04/2016 repeat in 10 years   Educated regarding shingrix   Abnormal Screens  none  Referrals  none  Patient concerns; None   Nurse Concerns; Will schedule this visit with Dr. Fabian SharpPanosh next year   Next PCP apt TBS       I have personally reviewed and noted the following in the patient's chart:   . Medical and social history . Use of alcohol, tobacco or illicit drugs  . Current medications and supplements . Functional ability and status . Nutritional status . Physical activity . Advanced directives . List of other physicians . Hospitalizations, surgeries, and ER visits in previous 12 months . Vitals . Screenings to include cognitive, depression, and falls . Referrals and appointments  In addition, I have reviewed and discussed with patient certain preventive protocols, quality metrics, and best practice recommendations. A written personalized care plan for preventive services as well as general preventive health recommendations were provided to patient.     Joslyne Marshburn, RN  12/29/2017   Above noted reviewed and agree. Berniece AndreasWanda Panosh, MD

## 2017-12-29 ENCOUNTER — Ambulatory Visit (INDEPENDENT_AMBULATORY_CARE_PROVIDER_SITE_OTHER): Payer: Medicare Other

## 2017-12-29 VITALS — BP 118/72 | HR 62 | Ht 64.0 in | Wt 168.0 lb

## 2017-12-29 DIAGNOSIS — Z0000 Encounter for general adult medical examination without abnormal findings: Secondary | ICD-10-CM | POA: Diagnosis not present

## 2017-12-29 NOTE — Patient Instructions (Addendum)
Penny Barnett , Thank you for taking time to come for your Medicare Wellness Visit. I appreciate your ongoing commitment to your health goals. Please review the following plan we discussed and let me know if I can assist you in the future.   Would recommend an eye apt every other year   Next year if it works out, schedule your apt with Dr. Fabian Sharp and Darl Pikes at the same day.  ( did discuss the shingrix at length and payment of such, as this may put her in the doughnut; may split does calendar year )   These are the goals we discussed: Goals    . Patient Stated     Will maintain this year  Would like to lose a few more weight Would like to get down below 150; Does not eat bread, potatoes etc Diet mayo        This is a list of the screening recommended for you and due dates:  Health Maintenance  Topic Date Due  . Flu Shot  01/13/2018  . Tetanus Vaccine  06/15/2018  . Mammogram  10/27/2019  . Colon Cancer Screening  04/27/2026  . DEXA scan (bone density measurement)  Completed  .  Hepatitis C: One time screening is recommended by Center for Disease Control  (CDC) for  adults born from [birth year] through 1967.   Completed  . Pneumonia vaccines  Completed    Summary: Preventive Care for Adults  A healthy lifestyle and preventive care can promote health and wellness. Preventive health guidelines for adults include the following key practices.  . A routine yearly physical is a good way to check with your health care provider about your health and preventive screening. It is a chance to share any concerns and updates on your health and to receive a thorough exam.  . Visit your dentist for a routine exam and preventive care every 6 months. Brush your teeth twice a day and floss once a day. Good oral hygiene prevents tooth decay and gum disease.  . The frequency of eye exams is based on your age, health, family medical history, use  of contact lenses, and other factors. Follow your health  care provider's ecommendations for frequency of eye exams.  . Eat a healthy diet. Foods like vegetables, fruits, whole grains, low-fat dairy products, and lean protein foods contain the nutrients you need without too many calories. Decrease your intake of foods high in solid fats, added sugars, and salt. Eat the right amount of calories for you. Get information about a proper diet from your health care provider, if necessary.  . Regular physical exercise is one of the most important things you can do for your health. Most adults should get at least 150 minutes of moderate-intensity exercise (any activity that increases your heart rate and causes you to sweat) each week. In addition, most adults need muscle-strengthening exercises on 2 or more days a week.  Silver Sneakers may be a benefit available to you. To determine eligibility, you may visit the website: www.silversneakers.com or contact program at 913-882-6351 Mon-Fri between 8AM-8PM.   . Maintain a healthy weight. The body mass index (BMI) is a screening tool to identify possible weight problems. It provides an estimate of body fat based on height and weight. Your health care provider can find your BMI and can help you achieve or maintain a healthy weight.   For adults 20 years and older: ? A BMI below 18.5 is considered underweight. ? A BMI of  18.5 to 24.9 is normal. ? A BMI of 27 to 28 is considered normal by the Institutes of Health  ? A BMI of 30 and above is considered obese.   . Maintain normal blood lipids and cholesterol levels by exercising and minimizing your intake of saturated fat. Eat a balanced diet with plenty of fruit and vegetables. Blood tests for lipids and cholesterol should begin at age 62 and be repeated every 5 years. If your lipid or cholesterol levels are high, you are over 50, or you are at high risk for heart disease, you may need your cholesterol levels checked more frequently. Ongoing high lipid and  cholesterol levels should be treated with medicines if diet and exercise are not working.  . If you smoke, find out from your health care provider how to quit. If you do not use tobacco, please do not start.  . If you choose to drink alcohol, please do not consume more than one drink for women and 2 for men.  One drink is considered to be 12 ounces (355 mL) of beer, 5 ounces (148 mL) of wine, or 1.5 ounces (44 mL) of liquor. Moderation of alcohol intake to this level decreases your risk of breast cancer and liver damage.   . If you are 54-51 years old, ask your health care provider if you should take aspirin to prevent strokes.  . Use sunscreen. Apply sunscreen liberally and repeatedly throughout the day. You should seek shade when your shadow is shorter than you. Protect yourself by wearing long sleeves, pants, a wide-brimmed hat, and sunglasses year round, whenever you are outdoors.  . Once a month, do a whole body skin exam, using a mirror to look at the skin on your back. Tell your health care provider of new moles, moles that have irregular borders, moles that are larger than a pencil eraser, or moles that have changed in shape or color.  Last, if you have completed an Advanced Directive; please bring a copy and review with your physician and then we will scan to the medical record      Fall Prevention in the Home Falls can cause injuries and can affect people from all age groups. There are many simple things that you can do to make your home safe and to help prevent falls. What can I do on the outside of my home?  Regularly repair the edges of walkways and driveways and fix any cracks.  Remove high doorway thresholds.  Trim any shrubbery on the main path into your home.  Use bright outdoor lighting.  Clear walkways of debris and clutter, including tools and rocks.  Regularly check that handrails are securely fastened and in good repair. Both sides of any steps should  have handrails.  Install guardrails along the edges of any raised decks or porches.  Have leaves, snow, and ice cleared regularly.  Use sand or salt on walkways during winter months.  In the garage, clean up any spills right away, including grease or oil spills. What can I do in the bathroom?  Use night lights.  Install grab bars by the toilet and in the tub and shower. Do not use towel bars as grab bars.  Use non-skid mats or decals on the floor of the tub or shower.  If you need to sit down while you are in the shower, use a plastic, non-slip stool.  Keep the floor dry. Immediately clean up any water that spills on the floor.  Remove  soap buildup in the tub or shower on a regular basis.  Attach bath mats securely with double-sided non-slip rug tape.  Remove throw rugs and other tripping hazards from the floor. What can I do in the bedroom?  Use night lights.  Make sure that a bedside light is easy to reach.  Do not use oversized bedding that drapes onto the floor.  Have a firm chair that has side arms to use for getting dressed.  Remove throw rugs and other tripping hazards from the floor. What can I do in the kitchen?  Clean up any spills right away.  Avoid walking on wet floors.  Place frequently used items in easy-to-reach places.  If you need to reach for something above you, use a sturdy step stool that has a grab bar.  Keep electrical cables out of the way.  Do not use floor polish or wax that makes floors slippery. If you have to use wax, make sure that it is non-skid floor wax.  Remove throw rugs and other tripping hazards from the floor. What can I do in the stairways?  Do not leave any items on the stairs.  Make sure that there are handrails on both sides of the stairs. Fix handrails that are broken or loose. Make sure that handrails are as long as the stairways.  Check any carpeting to make sure that it is firmly attached to the stairs. Fix any  carpet that is loose or worn.  Avoid having throw rugs at the top or bottom of stairways, or secure the rugs with carpet tape to prevent them from moving.  Make sure that you have a light switch at the top of the stairs and the bottom of the stairs. If you do not have them, have them installed. What are some other fall prevention tips?  Wear closed-toe shoes that fit well and support your feet. Wear shoes that have rubber soles or low heels.  When you use a stepladder, make sure that it is completely opened and that the sides are firmly locked. Have someone hold the ladder while you are using it. Do not climb a closed stepladder.  Add color or contrast paint or tape to grab bars and handrails in your home. Place contrasting color strips on the first and last steps.  Use mobility aids as needed, such as canes, walkers, scooters, and crutches.  Turn on lights if it is dark. Replace any light bulbs that burn out.  Set up furniture so that there are clear paths. Keep the furniture in the same spot.  Fix any uneven floor surfaces.  Choose a carpet design that does not hide the edge of steps of a stairway.  Be aware of any and all pets.  Review your medicines with your healthcare provider. Some medicines can cause dizziness or changes in blood pressure, which increase your risk of falling. Talk with your health care provider about other ways that you can decrease your risk of falls. This may include working with a physical therapist or trainer to improve your strength, balance, and endurance. This information is not intended to replace advice given to you by your health care provider. Make sure you discuss any questions you have with your health care provider. Document Released: 05/22/2002 Document Revised: 10/29/2015 Document Reviewed: 07/06/2014 Elsevier Interactive Patient Education  [employer]2018 Elsevier Inc.

## 2018-02-01 NOTE — Telephone Encounter (Signed)
Please advise Dr Panosh, thanks.   

## 2018-02-02 NOTE — Telephone Encounter (Signed)
I don't do letters for support  Animals  Out of my scope of care .

## 2018-02-25 ENCOUNTER — Telehealth: Payer: Self-pay | Admitting: Internal Medicine

## 2018-02-25 NOTE — Telephone Encounter (Signed)
New message:      Patient c/o Palpitations:  High priority if patient c/o lightheadedness, shortness of breath, or chest pain  1) How long have you had palpitations/irregular HR/ Afib? Are you having the symptoms now? afib  2) Are you currently experiencing lightheadedness, SOB or CP? no  3) Do you have a history of afib (atrial fibrillation) or irregular heart rhythm? afib  4) Have you checked your BP or HR? (document readings if available): 110-115 last night,currently 80; 109/69 now  5) Are you experiencing any other symptoms? Pt states she was having some afib issues last night but it has settled itself this morning.

## 2018-02-25 NOTE — Telephone Encounter (Signed)
Last night could tell that something wasn't right.  Couldn't sleep.  Took BP/HR HR last night 90s-120s.  BP 109/69, 91/67.  No CP, no SOB 109/67, 90s today.  Normal HR 60s-70s. Has felt good up until last night. Taking eliquis. No new medicines. Eating/drinking like normal.  No new stressors.  Wants to try to get HR to slow down.  Otherwise she is not symptomatic. Advised I will review with Dr. Tenny Crawoss and will call her back. Bisoprolol was decreased to daily at ov in Jan 2019.

## 2018-03-01 NOTE — Telephone Encounter (Signed)
Heat rate will go up and down   Can take an additional 1/2 of bisoprolol if racing.   BP may not allow her to take extra on a daily basis It is almost 1 year since seen.  Should be set up for return

## 2018-03-02 MED ORDER — BISOPROLOL FUMARATE 5 MG PO TABS: 5.0000 mg | ORAL_TABLET | Freq: Every day | ORAL | 1 refills | Status: DC

## 2018-03-02 NOTE — Telephone Encounter (Signed)
Spoke with the patient, she has been doing much better over the past few days. Advised that she could take an extra 1/2 dose of bisoprolol if racing and advised not to do daily due to BP. She confirmed she would call back to schedule an appointment with Dr. Tenny Crawoss in the future. She had no further questions.

## 2018-03-13 ENCOUNTER — Other Ambulatory Visit: Payer: Self-pay | Admitting: Internal Medicine

## 2018-03-14 NOTE — Telephone Encounter (Signed)
Eliquis 5mg  refill request received; pt is 67 yrs old, wt-78kg, Crea-0.58 on 11/24/17, last seen by Dr. Tenny Craw on 06/24/17; will send in refill to requested paharmacy.

## 2018-03-29 ENCOUNTER — Ambulatory Visit: Payer: Medicare Other

## 2018-04-01 ENCOUNTER — Other Ambulatory Visit: Payer: Self-pay | Admitting: Internal Medicine

## 2018-04-05 ENCOUNTER — Ambulatory Visit (INDEPENDENT_AMBULATORY_CARE_PROVIDER_SITE_OTHER): Payer: Medicare Other | Admitting: *Deleted

## 2018-04-05 DIAGNOSIS — Z23 Encounter for immunization: Secondary | ICD-10-CM

## 2018-05-09 ENCOUNTER — Other Ambulatory Visit: Payer: Self-pay | Admitting: Internal Medicine

## 2018-06-01 ENCOUNTER — Other Ambulatory Visit: Payer: Self-pay | Admitting: Internal Medicine

## 2018-06-16 ENCOUNTER — Encounter: Payer: Self-pay | Admitting: Internal Medicine

## 2018-07-11 ENCOUNTER — Ambulatory Visit (INDEPENDENT_AMBULATORY_CARE_PROVIDER_SITE_OTHER): Payer: Medicare Other | Admitting: Internal Medicine

## 2018-07-11 ENCOUNTER — Encounter: Payer: Self-pay | Admitting: Internal Medicine

## 2018-07-11 VITALS — BP 110/60 | HR 56 | Ht 64.0 in | Wt 175.8 lb

## 2018-07-11 DIAGNOSIS — I4892 Unspecified atrial flutter: Secondary | ICD-10-CM | POA: Diagnosis not present

## 2018-07-11 DIAGNOSIS — I10 Essential (primary) hypertension: Secondary | ICD-10-CM

## 2018-07-11 NOTE — Patient Instructions (Signed)
Medication Instructions:  No change If you need a refill on your cardiac medications before your next appointment, please call your pharmacy.   Lab work: none If you have labs (blood work) drawn today and your tests are completely normal, you will receive your results only by: . MyChart Message (if you have MyChart) OR . A paper copy in the mail If you have any lab test that is abnormal or we need to change your treatment, we will call you to review the results.  Testing/Procedures: none  Follow-Up: At [facility], you and your health needs are our priority.  As part of our continuing mission to provide you with exceptional heart care, we have created designated Provider Care Teams.  These Care Teams include your primary Cardiologist (physician) and Advanced Practice Providers (APPs -  Physician Assistants and Nurse Practitioners) who all work together to provide you with the care you need, when you need it. You will need a follow up appointment in:  12 months.  Please call our office 2 months in advance to schedule this appointment.  You may see Paula Ross, MD or one of the following Advanced Practice Providers on your designated Care Team: Scott Weaver, PA-C Vin Bhagat, PA-C . Janine Hammond, NP  Any Other Special Instructions Will Be Listed Below (If Applicable).    

## 2018-07-11 NOTE — Progress Notes (Signed)
Cardiology Office Note   Date:  07/11/2018   ID:  Penny Barnett, DOB 23-Dec-1950, MRN [id number]  PCP:  Madelin Headings, MD  Cardiologist:   Dietrich Pates, MD   F/U of PA flutter and HTN      History of Present Illness: Penny Barnett is a 68 y.o. female with a history of atrial flutter    S/p TEE /cardioversion in 2016   I saw the pt in Jan 2019    Lipids in June 2018 LDL 77  HDL 59  Trig 191  The pt called in in September   BP was 109, 91/,   HR 90s to 120s   Told to take 1/2 b blocker if returned   Took one   Did nto happen again  SInce then she has done well   No CP  Breathing is Ok  No dizziness   Occasional skip but nothing long Current Meds  Medication Sig  . bisoprolol (ZEBETA) 5 MG tablet Take 1 tablet (5 mg total) by mouth daily. Please keep upcoming appt in January with Dr. Tenny Craw for future refills. Thank you  . cholecalciferol (VITAMIN D) 1000 UNITS tablet Take 1,000 Units by mouth daily.    . cyclobenzaprine (FLEXERIL) 5 MG tablet Take 1 tablet (5 mg total) by mouth 3 (three) times daily as needed for muscle spasms.  Marland Kitchen ELIQUIS 5 MG TABS tablet TAKE 1 TABLET  TWO TIMES DAILY.  Marland Kitchen FLOVENT HFA 110 MCG/ACT inhaler INHALE 1 PUFF TWICE DAILY  . ipratropium (ATROVENT HFA) 17 MCG/ACT inhaler Inhale 2 puffs into the lungs 2 (two) times daily.  . IRON PO Take by mouth.  . lansoprazole (PREVACID) 15 MG capsule Take 15 mg by mouth daily at 12 noon.  . montelukast (SINGULAIR) 10 MG tablet TAKE 1 TABLET EVERY MORNING  . Multiple Vitamins-Minerals (MULTIVITAMIN WITH MINERALS) tablet Take 1 tablet by mouth daily.    . potassium chloride SA (K-DUR,KLOR-CON) 20 MEQ tablet Take 1 tablet (20 mEq total) by mouth daily.  . rosuvastatin (CRESTOR) 10 MG tablet Take 5 mg by mouth daily.   . theophylline (THEODUR) 300 MG 12 hr tablet Take 300 mg by mouth daily.   Marland Kitchen triamterene-hydrochlorothiazide (MAXZIDE-25) 37.5-25 MG tablet Take 0.5 tablets by mouth daily. Please keep upcoming appt in  January with Dr. Tenny Craw for future refills. Thank you  . VENTOLIN HFA 108 (90 Base) MCG/ACT inhaler INHALE 2 PUFFS INTO THE LUNGS EVERY 6 HOURS AS NEEDED FOR WHEEZING OR SHORTNESS OF BREATH.   Current Facility-Administered Medications for the 07/11/18 encounter (Office Visit) with Pricilla Riffle, MD  Medication  . 0.9 %  sodium chloride infusion     Allergies:   Penicillins   Past Medical History:  Diagnosis Date  . Allergy   . Anemia   . Arthritis    hands  . Asthma    as a child and then attack 1996  poss allergic to cat hair. ventilated x 2 . saw Dr Sebree Callas then .   Marland Kitchen Atrial flutter (HCC)    a. 12/2014 Echo: EF 60-65%, no rwma, mild MR;  b. S/P TEE/DCCV;  c. CHA2DS2VASc = 3-->Eliquis.  . Cataract    Had surgery to remove  . GERD (gastroesophageal reflux disease)    2000 taking otc prevacid   evey day.   . Hyperlipidemia   . Hypertension     3 meds controlled  . Kidney stone    passed stone - no surgery required  Past Surgical History:  Procedure Laterality Date  . CARDIOVERSION N/A 01/03/2015   Procedure: CARDIOVERSION;  Surgeon: Pricilla Riffle, MD;  [facility] END[facility];  Service: Cardiovascular;  Laterality: N/A;  . CATARACT EXTRACTION Bilateral 10/14/2010   both eyes  . COLONOSCOPY  02/2011   Arlyce Dice  . SHOULDER SURGERY Bilateral    x 2 - frozen shoulder rt 2006, 2000 left   . TEE WITHOUT CARDIOVERSION N/A 01/03/2015   Procedure: TRANSESOPHAGEAL ECHOCARDIOGRAM (TEE);  Surgeon: Pricilla Riffle, MD;  Location: [facility] ENDOSCOPY;  Service: Cardiovascular;  Laterality: N/A;  . TONSILLECTOMY       Social History:  The patient  reports that she quit smoking about 32 years ago. Her smoking use included cigarettes. She has a 20.00 pack-year smoking history. She has never used smokeless tobacco. She reports that she does not drink alcohol or use drugs.   Family History:  The patient's family history includes Alcohol abuse in her father; Bladder Cancer in her mother; Breast cancer in  her maternal grandmother and mother; Uterine cancer in her sister.    ROS:  Please see the history of present illness. All other systems are reviewed and  Negative to the above problem except as noted.    PHYSICAL EXAM: VS:  BP 110/60   Pulse (!) 56   Ht 5\' 4"  (1.626 m)   Wt 175 lb 12.8 oz (79.7 kg)   BMI 30.18 kg/m   GEN: Well nourished, well developed, in no acute distress  HEENT: normal  Neck: JVP normal   No carotid bruits, or masses Cardiac: RRR; no murmurs, rubs, or gallops,no edema  Respiratory:  clear to auscultation bilaterally, normal work of breathing GI: soft, nontender, nondistended, + BS  No hepatomegaly  MS: no deformity Moving all extremities   Skin: warm and dry, no rash Neuro:  Strength and sensation are intact Psych: euthymic mood, full affect   EKG:  EKG is ordered today.   Sinus bradycardia  56 bpm     Lipid Panel    Component Value Date/Time   CHOL 164 11/24/2017 1123   TRIG 126.0 11/24/2017 1123   HDL 58.40 11/24/2017 1123   CHOLHDL 3 11/24/2017 1123   VLDL 25.2 11/24/2017 1123   LDLCALC 81 11/24/2017 1123   LDLDIRECT 159.2 10/07/2012 0813      Wt Readings from Last 3 Encounters:  07/11/18 175 lb 12.8 oz (79.7 kg)  12/29/17 168 lb (76.2 kg)  11/24/17 172 lb (78 kg)      ASSESSMENT AND PLAN:  1   Atrial flutter  No cllinicall recurrence   Keep on same meds  2  HL  Lpids are good  Keep on Crestor  LDL 81  3  HTN  BP is well controlled  COntinue   Follows with W Panosh in June   I will see her in 1 year    Current medicines are reviewed at length with the patient today.  The patient does not have concerns regarding medicines.  Signed, Dietrich Pates, MD  07/11/2018 11:35 AM    [facility] [address] [place], [place], [place]  [address] Phone: [phone]; Fax: [phone]

## 2018-07-25 MED ORDER — BISOPROLOL FUMARATE 5 MG PO TABS: 5.0000 mg | ORAL_TABLET | Freq: Every day | ORAL | 3 refills | Status: DC

## 2018-07-25 MED ORDER — TRIAMTERENE-HCTZ 37.5-25 MG PO TABS: 0.5000 | ORAL_TABLET | Freq: Every day | ORAL | 3 refills | Status: DC

## 2018-07-25 MED ORDER — APIXABAN 5 MG PO TABS: 5.0000 mg | ORAL_TABLET | Freq: Two times a day (BID) | ORAL | 3 refills | Status: DC

## 2018-07-25 NOTE — Telephone Encounter (Signed)
Patient sent mychart message requesting prescription for refills be mailed to her so she can submit to [facility]. Prescriptions for bisoprolol, maxzide and apixaban printed and placed in outgoing mail to her.

## 2018-07-27 ENCOUNTER — Other Ambulatory Visit: Payer: Self-pay

## 2018-07-27 MED ORDER — IPRATROPIUM BROMIDE HFA 17 MCG/ACT IN AERS: 2.0000 | INHALATION_SPRAY | Freq: Two times a day (BID) | RESPIRATORY_TRACT | 1 refills | Status: DC

## 2018-07-27 MED ORDER — MONTELUKAST SODIUM 10 MG PO TABS: 10.0000 mg | ORAL_TABLET | Freq: Every morning | ORAL | 2 refills | Status: DC

## 2018-07-27 MED ORDER — FLUTICASONE PROPIONATE HFA 110 MCG/ACT IN AERO: 1.0000 | INHALATION_SPRAY | Freq: Two times a day (BID) | RESPIRATORY_TRACT | 3 refills | Status: DC

## 2018-09-12 ENCOUNTER — Other Ambulatory Visit: Payer: Self-pay | Admitting: Internal Medicine

## 2018-09-12 DIAGNOSIS — Z1231 Encounter for screening mammogram for malignant neoplasm of breast: Secondary | ICD-10-CM

## 2018-11-04 ENCOUNTER — Ambulatory Visit
Admission: RE | Admit: 2018-11-04 | Discharge: 2018-11-04 | Disposition: A | Discharge status: Home / Self Care | Payer: Medicare Other | Source: Ambulatory Visit | Attending: Internal Medicine | Admitting: Internal Medicine

## 2018-11-04 ENCOUNTER — Other Ambulatory Visit: Payer: Self-pay

## 2018-11-04 DIAGNOSIS — Z1231 Encounter for screening mammogram for malignant neoplasm of breast: Secondary | ICD-10-CM | POA: Diagnosis not present

## 2018-11-22 ENCOUNTER — Other Ambulatory Visit: Payer: Self-pay | Admitting: Internal Medicine

## 2018-11-25 NOTE — Progress Notes (Signed)
Chief Complaint  Patient presents with  . Annual Exam    No concerns today   . Medication Management  . Hypertension  . Hyperlipidemia  . Asthma    HPI: Penny Barnett 68 y.o. comes in today for yearly   Visit Chronic disease management  visit .Since last visit. Doing ok   HT  Saw dr Penny Barnett   Past hx of atrial flutter    No changes in meds  Allergy asthma:  Stable on nmeds  Taking  Inhalers daily and as needed. 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 Penny Barnett  06/15/2018  . INFLUENZA VACCINE  01/14/2019  . MAMMOGRAM  11/03/2020  . COLONOSCOPY  04/27/2026  . DEXA SCAN  Completed  . Hepatitis C Screening  Completed  . 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    Hearing:  ok  Vision:  No limitations at present . Last eye check UTD  Safety:  Has smoke detector and wears seat belts.  . No excess sun exposure. Sees dentist regularly.  Falls: no  Memory: Felt to be good  , no concern from her or her family.  Depression: No anhedonia unusual crying or depressive symptoms  Nutrition: Eats well balanced diet; adequate calcium and vitamin D. No swallowing chewing problems.  Injury: no major injuries in the last six months.  Other healthcare providers:  Reviewed today .  Preventive parameters: Reviewed   ADLS:   There are no problems or need for assistance  driving, feeding, obtaining food, dressing, toileting and bathing, managing money using phone. She is independent. Works    ROS:  GEN/ HEENT: No fever, significant weight changes sweats headaches vision problems hearing changes, CV/ PULM; No chest pain shortness of breath cough, syncope,edema  change in exercise tolerance. GI /GU: No adominal  pain, vomiting, change in bowel habits. No blood in the stool. No significant GU symptoms. SKIN/HEME: ,no acute skin rashes suspicious lesions or bleeding. No lymphadenopathy, nodules, masses.  NEURO/ PSYCH:  No neurologic signs such as weakness numbness. No depression anxiety. IMM/ Allergy: No unusual infections.  Allergy .   REST of 12 system review negative except as per HPI   Past Medical History:  Diagnosis Date  . Allergy   . Anemia   . Arthritis    hands  . Asthma    as a child and then attack 1996  poss allergic to cat hair. ventilated x 2 . saw Dr Penny Barnett then .   Marland Kitchen. Atrial flutter (HCC)    a. 12/2014 Echo: EF 60-65%, no rwma, mild MR;  b. S/P TEE/DCCV;  c. CHA2DS2VASc = 3-->Eliquis.  . Cataract    Had surgery to remove  . GERD (gastroesophageal reflux disease)    2000 taking otc prevacid   evey day.   . Hyperlipidemia   . Hypertension     3 meds controlled  . Kidney stone    passed stone - no surgery required    Family History  Problem Relation Age of Onset  . Breast cancer Mother   . Bladder Cancer Mother   . Uterine cancer Sister   . Alcohol abuse  Father   . Breast cancer Maternal Grandmother   . Colon cancer Neg Hx   . Heart attack Neg Hx   . Stroke Neg Hx   . Hypertension Neg Hx   . Esophageal cancer Neg Hx   . Rectal cancer Neg Hx   . Stomach cancer Neg Hx     Social History   Socioeconomic History  . Marital status: Single    Spouse name: Not on file  . Number of children: Not on file  . Years of education: Not on file  . Highest education level: Not on file  Occupational History  . Occupation: CSR  Social Needs  . Financial resource strain: Not on file  . Food insecurity    Worry: Not on file    Inability: Not on file  . Transportation needs    Medical: Not on file    Non-medical: Not on file  Tobacco Use  . Smoking status: Former Smoker    Packs/day: 1.00    Years: 20.00    Pack years: 20.00    Types: Cigarettes    Quit date: 06/21/1986     Years since quitting: 32.4  . Smokeless tobacco: Never Used  Substance and Sexual Activity  . Alcohol use: No    Alcohol/week: 0.0 standard drinks  . Drug use: No  . Sexual activity: Not on file  Lifestyle  . Physical activity    Days per week: Not on file    Minutes per session: Not on file  . Stress: Not on file  Relationships  . Social Herbalist on phone: Not on file    Gets together: Not on file    Attends religious service: Not on file    Active member of club or organization: Not on file    Attends meetings of clubs or organizations: Not on file    Relationship status: Not on file  Other Topics Concern  . 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    HS education  single   6 hours of sleep   Hh  Of 1  Pet dog and a horse.    Remote tobacco  1-2 ppd stopped 1988    Etoh couple times a month rare    ocass  Diet soda.              Outpatient Encounter Medications as of 11/28/2018  Medication Sig  . apixaban (ELIQUIS) 5 MG TABS tablet Take 1 tablet (5 mg total) by mouth 2 (two) times daily.  . ATROVENT HFA 17 MCG/ACT inhaler USE 2 INHALATIONS ORALLY   TWICE DAILY  . bisoprolol (ZEBETA) 5 MG tablet Take 1 tablet (5 mg total) by mouth daily.  . cholecalciferol (VITAMIN D) 1000 UNITS tablet Take 1,000 Units by mouth daily.    . fluticasone (FLOVENT HFA) 110 MCG/ACT inhaler Inhale 1 puff into the lungs 2 (two) times daily.  . IRON PO Take by mouth.  . lansoprazole (PREVACID) 15 MG capsule Take 15 mg by mouth daily at 12 noon.  . montelukast (SINGULAIR) 10 MG tablet Take 1 tablet (10 mg total) by mouth every morning.  . Multiple Vitamins-Minerals (MULTIVITAMIN WITH MINERALS) tablet Take 1 tablet by mouth daily.    Marland Kitchen  potassium chloride SA (K-DUR,KLOR-CON) 20 MEQ tablet Take 1 tablet (20 mEq total) by mouth daily.  .  rosuvastatin (CRESTOR) 10 MG tablet Take 5 mg by mouth daily.   . theophylline (THEODUR) 300 MG 12 hr tablet Take 300 mg by mouth daily.   Marland Kitchen. triamterene-hydrochlorothiazide (MAXZIDE-25) 37.5-25 MG tablet Take 0.5 tablets by mouth daily.  . VENTOLIN HFA 108 (90 Base) MCG/ACT inhaler INHALE 2 PUFFS INTO THE LUNGS EVERY 6 HOURS AS NEEDED FOR WHEEZING OR SHORTNESS OF BREATH.  . [DISCONTINUED] cyclobenzaprine (FLEXERIL) 5 MG tablet Take 1 tablet (5 mg total) by mouth 3 (three) times daily as needed for muscle spasms.   Facility-Administered Encounter Medications as of 11/28/2018  Medication  . 0.9 %  sodium chloride infusion    EXAM:  BP 126/82 (BP Location: Right Arm, Patient Position: Sitting, Cuff Size: Large)   Pulse (!) 55   Temp (!) 97.4 F (36.3 C) (Temporal)   Ht 5' 4.75" (1.645 m)   Wt 178 lb 9.6 oz (81 kg)   SpO2 98%   BMI 29.95 kg/m   Body mass index is 29.95 kg/m. Wt Readings from Last 3 Encounters:  11/28/18 178 lb 9.6 oz (81 kg)  07/11/18 175 lb 12.8 oz (79.7 kg)  12/29/17 168 lb (76.2 kg)    Physical Exam: Vital signs reviewed ZOX:WRUEGEN:This is a well-developed well-nourished alert cooperative   who appears stated age in no acute distress.  HEENT: normocephalic atraumatic , Eyes: PERRL EOM's full, conjunctiva clear, Nares: paten,t no deformity discharge or tenderness., Ears: no deformity EAC's clear TMs with normal landmarks. Mouth: deferred   Masking  NECK: supple without masses, thyromegaly or bruits. CHEST/PULM:  Clear to auscultation and percussion breath sounds equal no wheeze , rales or rhonchi. No chest wall deformities or tenderness. miild  Kyphosis  CV: PMI is nondisplaced, S1 S2 no gallops, murmurs, rubs. Peripheral pulses are full without delay.No JVD .  ABDOMEN: Bowel sounds normal nontender  No guard or rebound, no hepato splenomegal no CVA tenderness.   Extremtities:  No clubbing cyanosis or edema, no acute joint swelling or redness no focal atrophy  Vv noted   NEURO:  Oriented x3, cranial nerves 3-12 appear to be intact, no obvious focal weakness,gait within normal limits no abnormal reflexes or asymmetrical SKIN: No acute rashes normal turgor, color, no bruising or petechiae. PSYCH: Oriented, good eye contact, no obvious depression anxiety, cognition and judgment appear normal. LN: no cervical axillary  adenopathy No noted deficits in memory, attention, and speech.   Lab Results  Component Value Date   WBC 5.8 11/24/2017   HGB 13.4 11/24/2017   HCT 39.6 11/24/2017   PLT 201.0 11/24/2017   GLUCOSE 86 11/24/2017   CHOL 164 11/24/2017   TRIG 126.0 11/24/2017   HDL 58.40 11/24/2017   LDLDIRECT 159.2 10/07/2012   LDLCALC 81 11/24/2017   ALT 20 11/24/2017   AST 17 11/24/2017   NA 141 11/24/2017   K 3.7 11/24/2017   CL 102 11/24/2017   CREATININE 0.58 11/24/2017   BUN 16 11/24/2017   CO2 29 11/24/2017   TSH 0.66 11/24/2017    ASSESSMENT AND PLAN:  Discussed the following assessment and plan:   ICD-10-CM   1. Asthma, unspecified asthma severity, unspecified whether complicated, unspecified whether persistent  J45.909 Basic metabolic panel    CBC with Differential/Platelet    Hepatic function panel    Lipid panel    TSH  2. Medication management  Z79.899 Basic metabolic panel  CBC with Differential/Platelet    Hepatic function panel    Lipid panel    TSH  3. Hyperlipidemia, unspecified hyperlipidemia type  E78.5 Basic metabolic panel    CBC with Differential/Platelet    Hepatic function panel    Lipid panel    TSH  4. Chronic anticoagulation  Z79.01 Basic metabolic panel    CBC with Differential/Platelet    Hepatic function panel    Lipid panel    TSH  5. Essential hypertension  I10 Basic metabolic panel    CBC with Differential/Platelet    Hepatic function panel    Lipid panel    TSH  6. History of kidney stones  Z87.442 Basic metabolic panel    CBC with Differential/Platelet    Hepatic function panel    Lipid  panel    TSH   Stable clinically   Due for td but  Not covered by medicare  in office without  Dx   Can check with pharmacy  Plan monitoring labs   And  Fu yearly or earlier if  Indicated  Last dexa 2018 and "normal"  Patient Care Team: Panosh, Neta MendsWanda K, MD as PCP - General (Internal Medicine) Pricilla Riffleoss, Paula V, MD as PCP - Cardiology (Cardiology) Nita SellsHall, John, MD (Dermatology) Myrtie Neitheranis, Andreas BlowerHenry L III, MD as Consulting Physician (Gastroenterology)  Patient Instructions  Glad you are doing well.  Will notify you  of labs when available.   If all ok then yearly  Exam  And keep appt for AWV in future.     Neta MendsWanda K. Panosh M.D.

## 2018-11-28 ENCOUNTER — Other Ambulatory Visit: Payer: Self-pay

## 2018-11-28 ENCOUNTER — Ambulatory Visit (INDEPENDENT_AMBULATORY_CARE_PROVIDER_SITE_OTHER): Payer: Medicare Other | Admitting: Internal Medicine

## 2018-11-28 ENCOUNTER — Encounter: Payer: Self-pay | Admitting: Internal Medicine

## 2018-11-28 VITALS — BP 126/82 | HR 55 | Temp 97.4°F | Ht 64.75 in | Wt 178.6 lb

## 2018-11-28 DIAGNOSIS — I10 Essential (primary) hypertension: Secondary | ICD-10-CM | POA: Diagnosis not present

## 2018-11-28 DIAGNOSIS — Z7901 Long term (current) use of anticoagulants: Secondary | ICD-10-CM | POA: Diagnosis not present

## 2018-11-28 DIAGNOSIS — Z87442 Personal history of urinary calculi: Secondary | ICD-10-CM | POA: Diagnosis not present

## 2018-11-28 DIAGNOSIS — J45909 Unspecified asthma, uncomplicated: Secondary | ICD-10-CM

## 2018-11-28 DIAGNOSIS — E785 Hyperlipidemia, unspecified: Secondary | ICD-10-CM | POA: Diagnosis not present

## 2018-11-28 DIAGNOSIS — Z79899 Other long term (current) drug therapy: Secondary | ICD-10-CM | POA: Diagnosis not present

## 2018-11-28 LAB — CBC WITH DIFFERENTIAL/PLATELET
Basophils Absolute: 0.1 10*3/uL (ref 0.0–0.1)
Basophils Relative: 0.8 % (ref 0.0–3.0)
Eosinophils Absolute: 0.1 10*3/uL (ref 0.0–0.7)
Eosinophils Relative: 1.4 % (ref 0.0–5.0)
HCT: 39.2 % (ref 36.0–46.0)
Hemoglobin: 13 g/dL (ref 12.0–15.0)
Lymphocytes Relative: 19.6 % (ref 12.0–46.0)
Lymphs Abs: 1.3 10*3/uL (ref 0.7–4.0)
MCHC: 33.2 g/dL (ref 30.0–36.0)
MCV: 86.6 fl (ref 78.0–100.0)
Monocytes Absolute: 0.5 10*3/uL (ref 0.1–1.0)
Monocytes Relative: 7.7 % (ref 3.0–12.0)
Neutro Abs: 4.6 10*3/uL (ref 1.4–7.7)
Neutrophils Relative %: 70.5 % (ref 43.0–77.0)
Platelets: 205 10*3/uL (ref 150.0–400.0)
RBC: 4.53 Mil/uL (ref 3.87–5.11)
RDW: 14.2 % (ref 11.5–15.5)
WBC: 6.5 10*3/uL (ref 4.0–10.5)

## 2018-11-28 LAB — HEPATIC FUNCTION PANEL
ALT: 22 U/L (ref 0–35)
AST: 17 U/L (ref 0–37)
Albumin: 4.2 g/dL (ref 3.5–5.2)
Alkaline Phosphatase: 72 U/L (ref 39–117)
Bilirubin, Direct: 0.1 mg/dL (ref 0.0–0.3)
Total Bilirubin: 0.4 mg/dL (ref 0.2–1.2)
Total Protein: 6.7 g/dL (ref 6.0–8.3)

## 2018-11-28 LAB — LIPID PANEL
Cholesterol: 219 mg/dL — ABNORMAL HIGH (ref 0–200)
HDL: 59.1 mg/dL (ref >39.00)
NonHDL: 160.27
Total CHOL/HDL Ratio: 4
Triglycerides: 235 mg/dL — ABNORMAL HIGH (ref 0.0–149.0)
VLDL: 47 mg/dL — ABNORMAL HIGH (ref 0.0–40.0)

## 2018-11-28 LAB — BASIC METABOLIC PANEL
BUN: 19 mg/dL (ref 6–23)
CO2: 30 mEq/L (ref 19–32)
Calcium: 9.3 mg/dL (ref 8.4–10.5)
Chloride: 99 mEq/L (ref 96–112)
Creatinine, Ser: 0.6 mg/dL (ref 0.40–1.20)
GFR: 98.84 mL/min (ref >60.00)
Glucose, Bld: 93 mg/dL (ref 70–99)
Potassium: 2.9 mEq/L — ABNORMAL LOW (ref 3.5–5.1)
Sodium: 140 mEq/L (ref 135–145)

## 2018-11-28 LAB — TSH: TSH: 0.64 u[IU]/mL (ref 0.35–4.50)

## 2018-11-28 LAB — LDL CHOLESTEROL, DIRECT: Direct LDL: 129 mg/dL

## 2018-11-28 NOTE — Patient Instructions (Signed)
Glad you are doing well.  Will notify you  of labs when available.   If all ok then yearly  Exam  And keep appt for AWV in future.

## 2018-12-01 ENCOUNTER — Other Ambulatory Visit: Payer: Self-pay

## 2018-12-01 DIAGNOSIS — E876 Hypokalemia: Secondary | ICD-10-CM

## 2018-12-01 MED ORDER — POTASSIUM CHLORIDE CRYS ER 20 MEQ PO TBCR: 20.0000 meq | EXTENDED_RELEASE_TABLET | Freq: Every day | ORAL | 1 refills | Status: DC

## 2018-12-07 MED ORDER — POTASSIUM CHLORIDE CRYS ER 20 MEQ PO TBCR: 20.0000 meq | EXTENDED_RELEASE_TABLET | Freq: Every day | ORAL | 1 refills | Status: DC

## 2019-01-03 ENCOUNTER — Ambulatory Visit: Payer: Medicare Other

## 2019-01-25 ENCOUNTER — Other Ambulatory Visit: Payer: Self-pay | Admitting: Internal Medicine

## 2019-03-02 ENCOUNTER — Other Ambulatory Visit: Payer: Self-pay | Admitting: Internal Medicine

## 2019-03-11 ENCOUNTER — Other Ambulatory Visit: Payer: Self-pay

## 2019-03-11 ENCOUNTER — Ambulatory Visit (INDEPENDENT_AMBULATORY_CARE_PROVIDER_SITE_OTHER): Payer: Medicare Other

## 2019-03-11 DIAGNOSIS — Z23 Encounter for immunization: Secondary | ICD-10-CM | POA: Diagnosis not present

## 2019-03-24 ENCOUNTER — Other Ambulatory Visit: Payer: Self-pay | Admitting: Internal Medicine

## 2019-04-21 ENCOUNTER — Telehealth (INDEPENDENT_AMBULATORY_CARE_PROVIDER_SITE_OTHER): Payer: Medicare Other | Admitting: Internal Medicine

## 2019-04-21 ENCOUNTER — Encounter: Payer: Self-pay | Admitting: Internal Medicine

## 2019-04-21 ENCOUNTER — Other Ambulatory Visit: Payer: Self-pay

## 2019-04-21 DIAGNOSIS — Z79899 Other long term (current) drug therapy: Secondary | ICD-10-CM

## 2019-04-21 DIAGNOSIS — E876 Hypokalemia: Secondary | ICD-10-CM

## 2019-04-21 DIAGNOSIS — M62838 Other muscle spasm: Secondary | ICD-10-CM | POA: Diagnosis not present

## 2019-04-21 DIAGNOSIS — I10 Essential (primary) hypertension: Secondary | ICD-10-CM

## 2019-04-21 MED ORDER — CYCLOBENZAPRINE HCL 5 MG PO TABS
5.0000 mg | ORAL_TABLET | Freq: Three times a day (TID) | ORAL | 0 refills | Status: DC | PRN
Start: 2019-04-21 — End: 2021-02-04

## 2019-04-21 NOTE — Progress Notes (Signed)
Virtual Visit via Video Note  I connected with@ on 04/21/19 at  1:30 PM EST by a video enabled telemedicine application and verified that I am speaking with the correct person using two identifiers. Location patient: home Location provider:work office Persons participating in the virtual visit: patient, provider  WIth national recommendations  regarding COVID 19 pandemic   video visit is advised over in office visit for this patient.  Patient aware  of the limitations of evaluation and management by telemedicine and  availability of in person appointments. and agreed to proceed.   HPI: Penny Barnett presents for video visit Having a crick in neck and asks about possuse of low dose flexeril  She has used in past  She had slept on her sister's couch when her power went out at her place and her neck was in a funny position and has been having muscle spasm neck crick on the left for since then comes and goes.  In the past half of the Flexeril has been helpful.  She has to chop wood to keep going and has helped her in that situation No fever trauma weakness numbness and not concerned about the cause.    In regard to alst potassium level she is taking the higher dose of potassium 20 mEq but never got a follow-up of her potassium level from June when it was 2.8.  Denies any serious muscle spasm or weakness.  ROS: See pertinent positives and negatives per HPI.  Past Medical History:  Diagnosis Date  . Allergy   . Anemia   . Arthritis    hands  . Asthma    as a child and then attack 1996  poss allergic to cat hair. ventilated x 2 . saw Dr  CallasSharma then .   Marland Kitchen. Atrial flutter (HCC)    a. 12/2014 Echo: EF 60-65%, no rwma, mild MR;  b. S/P TEE/DCCV;  c. CHA2DS2VASc = 3-->Eliquis.  . Cataract    Had surgery to remove  . GERD (gastroesophageal reflux disease)    2000 taking otc prevacid   evey day.   . Hyperlipidemia   . Hypertension     3 meds controlled  . Kidney stone    passed stone -  no surgery required    Past Surgical History:  Procedure Laterality Date  . CARDIOVERSION N/A 01/03/2015   Procedure: CARDIOVERSION;  Surgeon: Pricilla RifflePaula Ross V, MD;  [facility];  Service: Cardiovascular;  Laterality: N/A;  . CATARACT EXTRACTION Bilateral 10/14/2010   both eyes  . COLONOSCOPY  02/2011   Arlyce DiceKaplan  . SHOULDER SURGERY Bilateral    x 2 - frozen shoulder rt 2006, 2000 left   . TEE WITHOUT CARDIOVERSION N/A 01/03/2015   Procedure: TRANSESOPHAGEAL ECHOCARDIOGRAM (TEE);  Surgeon: Pricilla RifflePaula Ross V, MD;  [facility];  Service: Cardiovascular;  Laterality: N/A;  . TONSILLECTOMY      Family History  Problem Relation Age of Onset  . Breast cancer Mother   . Bladder Cancer Mother   . Uterine cancer Sister   . Alcohol abuse Father   . Breast cancer Maternal Grandmother   . Colon cancer Neg Hx   . Heart attack Neg Hx   . Stroke Neg Hx   . Hypertension Neg Hx   . Esophageal cancer Neg Hx   . Rectal cancer Neg Hx   . Stomach cancer Neg Hx     Social History   Tobacco Use  . Smoking status: Former Smoker    Packs/day:  1.00    Years: 20.00    Pack years: 20.00    Types: Cigarettes    Quit date: 06/21/1986    Years since quitting: 32.8  . Smokeless tobacco: Never Used  Substance Use Topics  . Alcohol use: No    Alcohol/week: 0.0 standard drinks  . Drug use: No      Current Outpatient Medications:  .  apixaban (ELIQUIS) 5 MG TABS tablet, Take 1 tablet (5 mg total) by mouth 2 (two) times daily., Disp: 180 tablet, Rfl: 3 .  ATROVENT HFA 17 MCG/ACT inhaler, USE 2 INHALATIONS ORALLY   TWICE DAILY, Disp: 12.9 g, Rfl: 1 .  bisoprolol (ZEBETA) 5 MG tablet, Take 1 tablet (5 mg total) by mouth daily. Please make yearly appt with Dr. Tenny Craw for January before anymore refills. 1st attempt, Disp: 90 tablet, Rfl: 0 .  cholecalciferol (VITAMIN D) 1000 UNITS tablet, Take 1,000 Units by mouth daily.  , Disp: , Rfl:  .  cyclobenzaprine (FLEXERIL) 5 MG tablet, Take 1 tablet  (5 mg total) by mouth 3 (three) times daily as needed for muscle spasms., Disp: 15 tablet, Rfl: 0 .  fluticasone (FLOVENT HFA) 110 MCG/ACT inhaler, Inhale 1 puff into the lungs 2 (two) times daily., Disp: 24 g, Rfl: 3 .  IRON PO, Take by mouth., Disp: , Rfl:  .  KLOR-CON M20 20 MEQ tablet, TAKE 1 TABLET DAILY, Disp: 30 tablet, Rfl: 1 .  lansoprazole (PREVACID) 15 MG capsule, Take 15 mg by mouth daily at 12 noon., Disp: , Rfl:  .  montelukast (SINGULAIR) 10 MG tablet, TAKE 1 TABLET EVERY MORNING, Disp: 90 tablet, Rfl: 2 .  Multiple Vitamins-Minerals (MULTIVITAMIN WITH MINERALS) tablet, Take 1 tablet by mouth daily.  , Disp: , Rfl:  .  potassium chloride SA (K-DUR,KLOR-CON) 20 MEQ tablet, Take 1 tablet (20 mEq total) by mouth daily., Disp: 30 tablet, Rfl: 3 .  rosuvastatin (CRESTOR) 10 MG tablet, Take 5 mg by mouth daily. , Disp: , Rfl:  .  theophylline (THEODUR) 300 MG 12 hr tablet, Take 300 mg by mouth daily. , Disp: , Rfl:  .  triamterene-hydrochlorothiazide (MAXZIDE-25) 37.5-25 MG tablet, Take 0.5 tablets by mouth daily. Please make yearly appt with Dr. Tenny Craw for January before anymore refills. 1st attempt, Disp: 45 tablet, Rfl: 0 .  VENTOLIN HFA 108 (90 Base) MCG/ACT inhaler, INHALE 2 PUFFS INTO THE LUNGS EVERY 6 HOURS AS NEEDED FOR WHEEZING OR SHORTNESS OF BREATH., Disp: 54 g, Rfl: 0  Current Facility-Administered Medications:  .  0.9 %  sodium chloride infusion, 500 mL, Intravenous, Continuous, Danis, Starr Lake III, MD  EXAM: BP Readings from Last 3 Encounters:  11/28/18 126/82  07/11/18 110/60  12/29/17 118/72    VITALS per patient if applicable:  GENERAL: alert, oriented, appears well and in no acute distress  HEENT: atraumatic, conjunttiva clear, no obvious abnormalities on inspection of external nose and ears  NECK: normal movements of the head and neck slightly stiff on the left lateral neck points to the trapezius area but normal speech nonfocal good movement of her  extremity.  LUNGS: on inspection no signs of respiratory distress, breathing rate appears normal, no obvious gross SOB, gasping or wheezing CV: no obvious cyanosis MS: moves all visible extremities without noticeable abnormality  PSYCH/NEURO: pleasant and cooperative, no obvious depression or anxiety, speech and thought processing grossly intact Lab Results  Component Value Date   WBC 6.5 11/28/2018   HGB 13.0 11/28/2018   HCT 39.2 11/28/2018  PLT 205.0 11/28/2018   GLUCOSE 93 11/28/2018   CHOL 219 (H) 11/28/2018   TRIG 235.0 (H) 11/28/2018   HDL 59.10 11/28/2018   LDLDIRECT 129.0 11/28/2018   LDLCALC 81 11/24/2017   ALT 22 11/28/2018   AST 17 11/28/2018   NA 140 11/28/2018   K 2.9 (L) 11/28/2018   CL 99 11/28/2018   CREATININE 0.60 11/28/2018   BUN 19 11/28/2018   CO2 30 11/28/2018   TSH 0.64 11/28/2018    ASSESSMENT AND PLAN:  Discussed the following assessment and plan:    ICD-10-CM   1. Muscle spasms of neck  M62.838   2. Medication management  Z79.899   3. Low serum potassium  E87.6   4. Essential hypertension  I10     Counseled.  CN ask risks with Flexeril we will send in a small amount of 5 mg to use with caution patient aware can also use topical Biofreeze or topical Voltaren.  She is active physically.  Told her to make lab appointment for her follow-up potassium magnesium level orders are in the system.  She will do this.  Expectant management and discussion of plan and treatment with opportunity to ask questions and all were answered. The patient agreed with the plan and demonstrated an understanding of the instructions. Advised to call back or seek an in-person evaluation if worsening  or having  further concerns .   Shanon Ace, MD

## 2019-04-26 ENCOUNTER — Other Ambulatory Visit (INDEPENDENT_AMBULATORY_CARE_PROVIDER_SITE_OTHER): Payer: Medicare Other

## 2019-04-26 ENCOUNTER — Other Ambulatory Visit: Payer: Self-pay

## 2019-04-26 DIAGNOSIS — E876 Hypokalemia: Secondary | ICD-10-CM | POA: Diagnosis not present

## 2019-04-26 LAB — MAGNESIUM: Magnesium: 1.9 mg/dL (ref 1.5–2.5)

## 2019-04-26 LAB — POTASSIUM: Potassium: 4 mEq/L (ref 3.5–5.1)

## 2019-05-19 ENCOUNTER — Other Ambulatory Visit: Payer: Self-pay | Admitting: Internal Medicine

## 2019-05-19 NOTE — Telephone Encounter (Signed)
Prescription refill request for Eliquis received.  Last office visit: 07/11/2018, Ross Scr: 0.60, 11/28/2018 Age: 68 y.o. Weight: 81 kg  Prescription refill sent.

## 2019-07-10 MED ORDER — POTASSIUM CHLORIDE CRYS ER 20 MEQ PO TBCR: 20.0000 meq | EXTENDED_RELEASE_TABLET | Freq: Every day | ORAL | 1 refills | Status: DC

## 2019-07-10 MED ORDER — ATROVENT HFA 17 MCG/ACT IN AERS: INHALATION_SPRAY | RESPIRATORY_TRACT | 1 refills | Status: DC

## 2019-07-13 MED ORDER — ALBUTEROL SULFATE HFA 108 (90 BASE) MCG/ACT IN AERS: INHALATION_SPRAY | RESPIRATORY_TRACT | 0 refills | Status: DC

## 2019-07-13 NOTE — Addendum Note (Signed)
Addended by: Raiford Simmonds R on: 07/13/2019 07:55 AM   Modules accepted: Orders

## 2019-07-18 ENCOUNTER — Other Ambulatory Visit: Payer: Self-pay

## 2019-07-18 ENCOUNTER — Telehealth: Payer: Self-pay

## 2019-07-18 ENCOUNTER — Telehealth (INDEPENDENT_AMBULATORY_CARE_PROVIDER_SITE_OTHER): Payer: Medicare HMO | Admitting: Cardiology

## 2019-07-18 ENCOUNTER — Encounter: Payer: Self-pay | Admitting: Cardiology

## 2019-07-18 VITALS — BP 131/79 | HR 63 | Ht 64.5 in | Wt 175.0 lb

## 2019-07-18 DIAGNOSIS — I4892 Unspecified atrial flutter: Secondary | ICD-10-CM

## 2019-07-18 DIAGNOSIS — I10 Essential (primary) hypertension: Secondary | ICD-10-CM | POA: Diagnosis not present

## 2019-07-18 DIAGNOSIS — E785 Hyperlipidemia, unspecified: Secondary | ICD-10-CM

## 2019-07-18 MED ORDER — APIXABAN 5 MG PO TABS: 5.0000 mg | ORAL_TABLET | Freq: Two times a day (BID) | ORAL | 1 refills | Status: DC

## 2019-07-18 MED ORDER — BISOPROLOL FUMARATE 5 MG PO TABS: 5.0000 mg | ORAL_TABLET | Freq: Every day | ORAL | 3 refills | Status: DC

## 2019-07-18 NOTE — Telephone Encounter (Signed)
Spoke to pt who gave consent for [name] to have video conference. Penny Barnett

## 2019-07-18 NOTE — Addendum Note (Signed)
Addended by: Vernard Gambles on: 07/18/2019 09:18 AM   Modules accepted: Orders

## 2019-07-18 NOTE — Patient Instructions (Addendum)
Medication Instructions:   Your physician recommends that you continue on your current medications as directed. Please refer to the Current Medication list given to you today.  *If you need a refill on your cardiac medications before your next appointment, please call your pharmacy*  Lab Work: None ordered   If you have labs (blood work) drawn today and your tests are completely normal, you will receive your results only by: Marland Kitchen MyChart Message (if you have MyChart) OR . A paper copy in the mail If you have any lab test that is abnormal or we need to change your treatment, we will call you to review the results.  Testing/Procedures: None ordered   Follow-Up: At [facility], you and your health needs are our priority.  As part of our continuing mission to provide you with exceptional heart care, we have created designated Provider Care Teams.  These Care Teams include your primary Cardiologist (physician) and Advanced Practice Providers (APPs -  Physician Assistants and Nurse Practitioners) who all work together to provide you with the care you need, when you need it.  Your next appointment:   1 year(s)  The format for your next appointment:   In Person  Provider:   You may see Dietrich Pates, MD or one of the following Advanced Practice Providers on your designated Care Team:    Tereso Newcomer, PA-C  [place]in [place], [place]  Berton Bon, NP   Other Instructions  Lifestyle Modifications to Prevent and Treat Heart Disease -Recommend heart healthy/Mediterranean diet, with whole grains, fruits, vegetables, fish, lean meats, nuts, olive oil and avocado oil.  -Limit salt intake to less than 2000 mg per day.  -Recommend moderate walking, starting slowly with a few minutes and working up to 3-5 times/week for 30-50 minutes each session. Aim for at least 150 minutes.week. Goal should be pace of 3 miles/hours, or walking 1.5 miles in 30 minutes -Recommend avoidance of tobacco products. Avoid  excess alcohol. -Keep blood pressure well controlled, ideally less than 130/80.

## 2019-07-18 NOTE — Progress Notes (Signed)
Virtual Visit via Video Note   This visit type was conducted due to national recommendations for restrictions regarding the COVID-19 Pandemic (e.g. social distancing) in an effort to limit this patient's exposure and mitigate transmission in our community.  Due to her co-morbid illnesses, this patient is at least at moderate risk for complications without adequate follow up.  This format is felt to be most appropriate for this patient at this time.  All issues noted in this document were discussed and addressed.  A limited physical exam was performed with this format.  Please refer to the patient's chart for her consent to telehealth for [facility].   Date:  07/18/2019   ID:  Penny Barnett, DOB 1951/05/03, MRN [id number]  Patient Location: Home Provider Location: Home  PCP:  Madelin Headings, MD  Cardiologist:  Dietrich Pates, MD  Electrophysiologist:  None   Evaluation Performed:  Follow-Up Visit  Chief Complaint: Follow-up for atrial flutter and hypertension  History of Present Illness:    Penny Barnett is a 69 y.o. female with a past medical history significant for hypertension, atrial flutter, asthma.  She had TEE/cardioversion in 2016.  She has taken extra half dose of bisoprolol as needed for breakthrough tachyarrhythmia.  Blood pressure was not strong enough to support extra dosing on a daily basis.  This is worked well for her.  The patient is seen today for annual follow-up. She takes care of 7 horses, walking about 1-2 miles every morning. She goes to the [facility] twice a week for weights and cardio. She denies chest pain/pressure, shortness of breath, orthopnea, PND, edema, palpitations, lightheadedness or syncope. She has no recent perceived recurrences of aflutter. She continues to take Eliquis with no bleeding issues.   Home BP 110-130/70's  The patient does not have symptoms concerning for COVID-19 infection (fever, chills, cough, or new shortness of breath).    Past  Medical History:  Diagnosis Date  . Allergy   . Anemia   . Arthritis    hands  . Asthma    as a child and then attack 1996  poss allergic to cat hair. ventilated x 2 . saw Dr [place] Callas then .   Marland Kitchen Atrial flutter (HCC)    a. 12/2014 Echo: EF 60-65%, no rwma, mild MR;  b. S/P TEE/DCCV;  c. CHA2DS2VASc = 3-->Eliquis.  . Cataract    Had surgery to remove  . GERD (gastroesophageal reflux disease)    2000 taking otc prevacid   evey day.   . Hyperlipidemia   . Hypertension     3 meds controlled  . Kidney stone    passed stone - no surgery required   Past Surgical History:  Procedure Laterality Date  . CARDIOVERSION N/A 01/03/2015   Procedure: CARDIOVERSION;  Surgeon: Pricilla Riffle, MD;  Location: [facility] ENDOSCOPY;  Service: Cardiovascular;  Laterality: N/A;  . CATARACT EXTRACTION Bilateral 10/14/2010   both eyes  . COLONOSCOPY  02/2011   Arlyce Dice  . SHOULDER SURGERY Bilateral    x 2 - frozen shoulder rt 2006, 2000 left   . TEE WITHOUT CARDIOVERSION N/A 01/03/2015   Procedure: TRANSESOPHAGEAL ECHOCARDIOGRAM (TEE);  Surgeon: Pricilla Riffle, MD;  Location: [facility] ENDOSCOPY;  Service: Cardiovascular;  Laterality: N/A;  . TONSILLECTOMY       Current Meds  Medication Sig  . albuterol (VENTOLIN HFA) 108 (90 Base) MCG/ACT inhaler INHALE 2 PUFFS INTO THE LUNGS EVERY 6 HOURS AS NEEDED FOR WHEEZING OR SHORTNESS OF BREATH.  Marland Kitchen  bisoprolol (ZEBETA) 5 MG tablet Take 1 tablet (5 mg total) by mouth daily. Please make yearly appt with Dr. Tenny Craw for January before anymore refills. 1st attempt  . cholecalciferol (VITAMIN D) 1000 UNITS tablet Take 1,000 Units by mouth daily.    . cyclobenzaprine (FLEXERIL) 5 MG tablet Take 1 tablet (5 mg total) by mouth 3 (three) times daily as needed for muscle spasms.  Marland Kitchen ELIQUIS 5 MG TABS tablet TAKE 1 TABLET TWICE A DAY  . FLOVENT HFA 110 MCG/ACT inhaler USE 1 INHALATION ORALLY    TWICE DAILY  . ipratropium (ATROVENT HFA) 17 MCG/ACT inhaler USE 2 INHALATIONS ORALLY   TWICE DAILY  .  IRON PO Take by mouth daily.   . lansoprazole (PREVACID) 15 MG capsule Take 15 mg by mouth daily at 12 noon.  . montelukast (SINGULAIR) 10 MG tablet TAKE 1 TABLET EVERY MORNING  . Multiple Vitamins-Minerals (MULTIVITAMIN WITH MINERALS) tablet Take 1 tablet by mouth daily.    . potassium chloride SA (KLOR-CON) 20 MEQ tablet Take 1 tablet (20 mEq total) by mouth daily.  . rosuvastatin (CRESTOR) 10 MG tablet Take 5 mg by mouth daily.   Marland Kitchen triamterene-hydrochlorothiazide (MAXZIDE-25) 37.5-25 MG tablet Take 0.5 tablets by mouth daily. Please make yearly appt with Dr. Tenny Craw for January before anymore refills. 1st attempt   Current Facility-Administered Medications for the 07/18/19 encounter (Telemedicine) with Berton Bon, NP  Medication  . 0.9 %  sodium chloride infusion     Allergies:   Penicillins   Social History   Tobacco Use  . Smoking status: Former Smoker    Packs/day: 1.00    Years: 20.00    Pack years: 20.00    Types: Cigarettes    Quit date: 06/21/1986    Years since quitting: 33.0  . Smokeless tobacco: Never Used  Substance Use Topics  . Alcohol use: No    Alcohol/week: 0.0 standard drinks  . Drug use: No     Family Hx: The patient's family history includes Alcohol abuse in her father; Bladder Cancer in her mother; Breast cancer in her maternal grandmother and mother; Uterine cancer in her sister. There is no history of Colon cancer, Heart attack, Stroke, Hypertension, Esophageal cancer, Rectal cancer, or Stomach cancer.  ROS:   Please see the history of present illness.     All other systems reviewed and are negative.   Prior CV studies:   The following studies were reviewed today:  Echocardiogram 01/01/2015 Study Conclusions: - Left ventricle: The cavity size was normal. Wall thickness was  normal. Systolic function was normal. The estimated ejection  fraction was in the range of 60% to 65%. Wall motion was normal;  there were no regional wall motion  abnormalities.  - Mitral valve: There was mild regurgitation.  - Left atrium: The atrium was moderately dilated.  - Pulmonary arteries: Systolic pressure was moderately increased.  PA peak pressure: 48 mm Hg (S).   Impressions: - Normal LV function; mild MR; moderate LAE; mild TR with  moderately elevated pulmonary pressure.   Labs/Other Tests and Data Reviewed:    EKG:  An ECG dated 06/16/2018 was personally reviewed today and demonstrated:  sinus bradycardia 56 bpm  Recent Labs: 11/28/2018: ALT 22; BUN 19; Creatinine, Ser 0.60; Hemoglobin 13.0; Platelets 205.0; Sodium 140; TSH 0.64 04/26/2019: Magnesium 1.9; Potassium 4.0   Recent Lipid Panel Lab Results  Component Value Date/Time   CHOL 219 (H) 11/28/2018 10:48 AM   TRIG 235.0 (H) 11/28/2018 10:48 AM  HDL 59.10 11/28/2018 10:48 AM   CHOLHDL 4 11/28/2018 10:48 AM   LDLCALC 81 11/24/2017 11:23 AM   LDLDIRECT 129.0 11/28/2018 10:48 AM    Wt Readings from Last 3 Encounters:  07/18/19 175 lb (79.4 kg)  11/28/18 178 lb 9.6 oz (81 kg)  07/11/18 175 lb 12.8 oz (79.7 kg)     Objective:    Vital Signs:  BP 131/79   Pulse 63   Ht 5' 4.5" (1.638 m)   Wt 175 lb (79.4 kg)   BMI 29.57 kg/m    VITAL SIGNS:  reviewed GEN:  no acute distress EYES:  sclerae anicteric, EOMI - Extraocular Movements Intact RESPIRATORY:  normal respiratory effort, symmetric expansion NEURO:  alert and oriented x 3, no obvious focal deficit PSYCH:  normal affect  ASSESSMENT & PLAN:    Atrial flutter -Well controlled on beta-blocker. Has had no recent episodes.  -On Eliquis for stroke risk reduction.  No unusual bleeding. -Will provide refills.   Hypertension -On bisoprolol and triamterene-hydrochlorothiazide. -BP well controlled. -continue current therapy.   Hyperlipidemia -On rosuvastatin 10 mg daily -Lipid panel 11/28/2018: TC 219, HDL 59, direct LDL 129, triglycerides 235 -She has no prior history of MI or stroke. 129 is not terribly  high but I would like to see it closer to the 81 that it was the prior year.  -She has been cutting her crestor in half to make it last longer, for 5 mg. I discussed that rosuvastatin is likely at the lower tier on insurance and she should look into how much it costs. We discussed diet and exercise. She will try to take her prescribed dose in order to improve lipid levels.   COVID-19 Education: The signs and symptoms of COVID-19 were discussed with the patient and how to seek care for testing (follow up with PCP or arrange E-visit).  The importance of social distancing was discussed today.  Time:   Today, I have spent 9 minutes with the patient with telehealth technology discussing the above problems.     Medication Adjustments/Labs and Tests Ordered: Current medicines are reviewed at length with the patient today.  Concerns regarding medicines are outlined above.   Tests Ordered: No orders of the defined types were placed in this encounter.   Medication Changes: No orders of the defined types were placed in this encounter.   Follow Up:  Either In Person or Virtual in 1 year(s)  Signed, Daune Perch, NP  07/18/2019 9:09 AM    [place]

## 2019-07-18 NOTE — Telephone Encounter (Signed)
53m 81kg Scr 0.6 (11/28/18) Lovw/hammond 07/18/19

## 2019-09-04 MED ORDER — FLOVENT HFA 110 MCG/ACT IN AERO: INHALATION_SPRAY | RESPIRATORY_TRACT | 0 refills | Status: DC

## 2019-09-04 MED ORDER — MONTELUKAST SODIUM 10 MG PO TABS: 10.0000 mg | ORAL_TABLET | Freq: Every morning | ORAL | 0 refills | Status: DC

## 2019-09-04 MED ORDER — ALBUTEROL SULFATE HFA 108 (90 BASE) MCG/ACT IN AERS: INHALATION_SPRAY | RESPIRATORY_TRACT | 0 refills | Status: DC

## 2019-09-04 MED ORDER — ATROVENT HFA 17 MCG/ACT IN AERS: INHALATION_SPRAY | RESPIRATORY_TRACT | 0 refills | Status: DC

## 2019-09-04 MED ORDER — TRIAMTERENE-HCTZ 37.5-25 MG PO TABS: 0.5000 | ORAL_TABLET | Freq: Every day | ORAL | 3 refills | Status: DC

## 2019-09-18 ENCOUNTER — Other Ambulatory Visit: Payer: Self-pay

## 2019-09-18 MED ORDER — ALBUTEROL SULFATE HFA 108 (90 BASE) MCG/ACT IN AERS: INHALATION_SPRAY | RESPIRATORY_TRACT | 0 refills | Status: DC

## 2019-09-18 MED ORDER — FLOVENT HFA 110 MCG/ACT IN AERO: INHALATION_SPRAY | RESPIRATORY_TRACT | 0 refills | Status: DC

## 2019-09-18 MED ORDER — MONTELUKAST SODIUM 10 MG PO TABS: 10.0000 mg | ORAL_TABLET | Freq: Every morning | ORAL | 0 refills | Status: DC

## 2019-10-02 ENCOUNTER — Other Ambulatory Visit: Payer: Self-pay | Admitting: Internal Medicine

## 2019-10-02 DIAGNOSIS — Z1231 Encounter for screening mammogram for malignant neoplasm of breast: Secondary | ICD-10-CM

## 2019-11-08 ENCOUNTER — Ambulatory Visit
Admission: RE | Admit: 2019-11-08 | Discharge: 2019-11-08 | Disposition: A | Discharge status: Home / Self Care | Payer: Medicare HMO | Source: Ambulatory Visit | Attending: Internal Medicine | Admitting: Internal Medicine

## 2019-11-08 ENCOUNTER — Other Ambulatory Visit: Payer: Self-pay

## 2019-11-08 DIAGNOSIS — Z1231 Encounter for screening mammogram for malignant neoplasm of breast: Secondary | ICD-10-CM

## 2019-12-16 ENCOUNTER — Other Ambulatory Visit: Payer: Self-pay | Admitting: Internal Medicine

## 2020-01-03 ENCOUNTER — Other Ambulatory Visit: Payer: Self-pay | Admitting: Internal Medicine

## 2020-01-22 NOTE — Progress Notes (Signed)
Chief Complaint  Patient presents with  . Medicare Wellness    Doing well  . Annual Exam    HPI: Penny Barnett 69 y.o. comes in today for Preventive Medicare exam/ wellness visit .Since last visit. Doing ok  No new dx  Allergy resp is stable CV stable no bleeding syncope   bp ok   Declining  covid vaccine so far  Hesitant about side effects  ( knows people who had  Significant se)  Cardiology  Health Maintenance  Topic Date Due  . COVID-19 Vaccine (1) Never done  . TETANUS/TDAP  06/15/2018  . INFLUENZA VACCINE  01/14/2020  . MAMMOGRAM  11/07/2021  . COLONOSCOPY  04/27/2026  . DEXA SCAN  Completed  . Hepatitis C Screening  Completed  . 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  21  Hours  Per wee     Hearing:   Ok   Some decrease   Vision:  No limitations at present . Last eye check UTD  Safety:  Has smoke detector and wears seat belts.  No firearms. No excess sun exposure. Sees dentist regularly.  Falls: ne.  Memory: Felt to be good  , no concern from her or her family.  Depression: No anhedonia unusual crying or depressive symptoms  Nutrition: Eats well balanced diet; adequate calcium and vitamin D. No swallowing chewing problems.  Injury: no major injuries in the last six months.  Other healthcare providers:  Reviewed today .  Preventive parameters:  Reviewed   ADLS:   There are no problems or need for assistance  driving, feeding, obtaining food, dressing, toileting and bathing, managing money using phone. She is independent.    ROS:  GEN/ HEENT: No fever, significant weight changes sweats headaches vision problems hearing changes, CV/ PULM; No chest pain shortness of breath cough, syncope,edema  change in exercise tolerance. GI /GU: No adominal pain,  vomiting, change in bowel habits. No blood in the stool. No significant GU symptoms. SKIN/HEME: ,no acute skin rashes suspicious lesions or bleeding. No lymphadenopathy, nodules, masses.  NEURO/ PSYCH:  No neurologic signs such as weakness numbness. No depression anxiety. IMM/ Allergy: No unusual infections.  Allergy .   REST of 12 system review negative except as per HPI   Past Medical History:  Diagnosis Date  . Allergy   . Anemia   . Arthritis    hands  . Asthma    as a child and then attack 1996  poss allergic to cat hair. ventilated x 2 . saw Dr Vernon Callas then .   Marland Kitchen Atrial flutter (HCC)    a. 12/2014 Echo: EF 60-65%, no rwma, mild MR;  b. S/P TEE/DCCV;  c. CHA2DS2VASc = 3-->Eliquis.  . Cataract    Had surgery to remove  . GERD (gastroesophageal reflux disease)    2000 taking otc prevacid   evey day.   . Hyperlipidemia   . Hypertension     3 meds controlled  . Kidney stone    passed stone - no surgery required    Family History  Problem Relation Age of Onset  . Breast cancer Mother   . Bladder Cancer Mother   . Uterine cancer Sister   . Alcohol abuse Father   . Breast cancer Maternal Grandmother   .  Colon cancer Neg Hx   . Heart attack Neg Hx   . Stroke Neg Hx   . Hypertension Neg Hx   . Esophageal cancer Neg Hx   . Rectal cancer Neg Hx   . Stomach cancer Neg Hx     Social History   Socioeconomic History  . Marital status: Single    Spouse name: Not on file  . Number of children: Not on file  . Years of education: Not on file  . Highest education level: Not on file  Occupational History  . Occupation: CSR  Tobacco Use  . Smoking status: Former Smoker    Packs/day: 1.00    Years: 20.00    Pack years: 20.00    Types: Cigarettes    Quit date: 06/21/1986    Years since quitting: 33.6  . Smokeless tobacco: Never Used  Vaping Use  . Vaping Use: Never used  Substance and Sexual Activity  . Alcohol use: No    Alcohol/week: 0.0 standard drinks  . Drug use: No    . Sexual activity: Not on file  Other Topics Concern  . 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    HS education  single   6 hours of sleep   Hh  Of 1  Pet dog and a horse.    Remote tobacco  1-2 ppd stopped 1988    Etoh couple times a month rare    ocass  Diet soda.             Social Determinants of Health   Financial Resource Strain:   . Difficulty of Paying Living Expenses:   Food Insecurity:   . Worried About Programme researcher, broadcasting/film/video in the Last Year:   . Barista in the Last Year:   Transportation Needs:   . Freight forwarder (Medical):   Marland Kitchen Lack of Transportation (Non-Medical):   Physical Activity:   . Days of Exercise per Week:   . Minutes of Exercise per Session:   Stress:   . Feeling of Stress :   Social Connections:   . Frequency of Communication with Friends and Family:   . Frequency of Social Gatherings with Friends and Family:   . Attends Religious Services:   . Active Member of Clubs or Organizations:   . Attends Banker Meetings:   Marland Kitchen Marital Status:     Outpatient Encounter Medications as of 01/23/2020  Medication Sig  . albuterol (VENTOLIN HFA) 108 (90 Base) MCG/ACT inhaler INHALE 2 PUFFS INTO THE LUNGS EVERY 6 HOURS AS NEEDED FOR WHEEZING OR SHORTNESS OF BREATH.  Marland Kitchen apixaban (ELIQUIS) 5 MG TABS tablet Take 1 tablet (5 mg total) by mouth 2 (two) times daily.  . bisoprolol (ZEBETA) 5 MG tablet Take 1 tablet (5 mg total) by mouth daily.  . cholecalciferol (VITAMIN D) 1000 UNITS tablet Take 1,000 Units by mouth daily.    . cyclobenzaprine (FLEXERIL) 5 MG tablet Take 1 tablet (5 mg total) by mouth 3 (three) times daily as needed for muscle spasms.  . fluticasone (FLOVENT HFA) 110 MCG/ACT inhaler TAKE 1 PUFF BY MOUTH TWICE A DAY  . ipratropium (ATROVENT HFA) 17 MCG/ACT inhaler USE 2 INHALATIONS  ORALLY   TWICE DAILY  .  IRON PO Take by mouth daily.   Marland Kitchen. KLOR-CON M20 20 MEQ tablet TAKE 1 TABLET BY MOUTH EVERY DAY  . lansoprazole (PREVACID) 15 MG capsule Take 15 mg by mouth daily at 12 noon.  . montelukast (SINGULAIR) 10 MG tablet TAKE 1 TABLET BY MOUTH EVERY DAY IN THE MORNING  . Multiple Vitamins-Minerals (MULTIVITAMIN WITH MINERALS) tablet Take 1 tablet by mouth daily.    . rosuvastatin (CRESTOR) 10 MG tablet Take 5 mg by mouth daily.   Marland Kitchen. triamterene-hydrochlorothiazide (MAXZIDE-25) 37.5-25 MG tablet Take 0.5 tablets by mouth daily.   Facility-Administered Encounter Medications as of 01/23/2020  Medication  . 0.9 %  sodium chloride infusion    EXAM:  BP 118/78   Pulse 69   Temp 97.6 F (36.4 C) (Oral)   Ht 5\' 4"  (1.626 m)   Wt 180 lb 12.8 oz (82 kg)   SpO2 96%   BMI 31.03 kg/m   Body mass index is 31.03 kg/m.  Physical Exam: Vital signs reviewed ZOX:WRUEGEN:This is a well-developed well-nourished alert cooperative   who appears stated age in no acute distress.  HEENT: normocephalic atraumatic , Eyes: PERRL EOM's full, conjunctiva clear, Nares: paten,t no deformity discharge or tenderness., Ears: no deformity EAC's clear TMs with normal landmarks. Mouth: masked   Moist mucous membranes. Dentition in adequate repair. NECK: supple without masses, thyromegaly or bruits. CHEST/PULM:  Clear to auscultation and percussion breath sounds equal no wheeze , rales or rhonchi. No chest wall deformities or tenderness. CV: PMI is nondisplaced, S1 S2 no gallops, murmurs, rubs. Peripheral pulses are full without delay.No JVD .  ABDOMEN: Bowel sounds normal nontender  No guard or rebound, no hepato splenomegal no CVA tenderness.   Extremtities:  No clubbing cyanosis or edema, no acute joint swelling or redness   Finger  Swelling arthritis changes   Pip area   NEURO:  Oriented x3, cranial nerves 3-12 appear to be intact, no obvious focal weakness,gait within normal limits no abnormal reflexes or  asymmetrical SKIN: No acute rashes normal turgor, color, no bruising or petechiae. PSYCH: Oriented, good eye contact, no obvious depression anxiety, cognition and judgment appear normal. LN: no cervical axillary inguinal adenopathy No noted deficits in memory, attention, and speech.   Lab Results  Component Value Date   WBC 6.5 11/28/2018   HGB 13.0 11/28/2018   HCT 39.2 11/28/2018   PLT 205.0 11/28/2018   GLUCOSE 93 11/28/2018   CHOL 219 (H) 11/28/2018   TRIG 235.0 (H) 11/28/2018   HDL 59.10 11/28/2018   LDLDIRECT 129.0 11/28/2018   LDLCALC 81 11/24/2017   ALT 22 11/28/2018   AST 17 11/28/2018   NA 140 11/28/2018   K 4.0 04/26/2019   CL 99 11/28/2018   CREATININE 0.60 11/28/2018   BUN 19 11/28/2018   CO2 30 11/28/2018   TSH 0.64 11/28/2018   Ate break fast  Early 5 am cottage cheese  ASSESSMENT AND PLAN:  Discussed the following assessment and plan:  Visit for preventive health examination  Essential hypertension - Plan: Basic metabolic panel, CBC with Differential/Platelet, Hemoglobin A1c, Hepatic function panel, Lipid panel, TSH, TSH, Lipid panel, Hepatic function panel, Hemoglobin A1c, CBC with Differential/Platelet, Basic metabolic panel  Medication management - Plan: Basic metabolic panel, CBC with Differential/Platelet, Hemoglobin A1c, Hepatic function panel, Lipid panel, TSH, TSH, Lipid panel, Hepatic function panel, Hemoglobin A1c, CBC with Differential/Platelet, Basic metabolic panel  Hyperlipidemia, unspecified hyperlipidemia type - Plan: Basic metabolic panel, CBC with Differential/Platelet, Hemoglobin A1c, Hepatic function panel,  Lipid panel, TSH, TSH, Lipid panel, Hepatic function panel, Hemoglobin A1c, CBC with Differential/Platelet, Basic metabolic panel  Chronic anticoagulation - hx of  af afl  - Plan: Basic metabolic panel, CBC with Differential/Platelet, Hemoglobin A1c, Hepatic function panel, Lipid panel, TSH, TSH, Lipid panel, Hepatic function panel,  Hemoglobin A1c, CBC with Differential/Platelet, Basic metabolic panel  Asthma, unspecified asthma severity, unspecified whether complicated, unspecified whether persistent - quiescent - Plan: Basic metabolic panel, CBC with Differential/Platelet, Hemoglobin A1c, Hepatic function panel, Lipid panel, TSH, TSH, Lipid panel, Hepatic function panel, Hemoglobin A1c, CBC with Differential/Platelet, Basic metabolic panel Disc  Benefit more than risk of covid vaccine  Counseled      Patient Care Team: Chidi Shirer, Neta Mends, MD as PCP - General (Internal Medicine) Pricilla Riffle, MD as PCP - Cardiology (Cardiology) Nita Sells, MD (Dermatology) Myrtie Neither Andreas Blower, MD as Consulting Physician (Gastroenterology)  Patient Instructions  Glad you are doing well.   Will notify you  of labs when available. And will share with cardiology .    Health Maintenance, Female Adopting a healthy lifestyle and getting preventive care are important in promoting health and wellness. Ask your health care provider about:  The right schedule for you to have regular tests and exams.  Things you can do on your own to prevent diseases and keep yourself healthy. What should I know about diet, weight, and exercise? Eat a healthy diet   Eat a diet that includes plenty of vegetables, fruits, low-fat dairy products, and lean protein.  Do not eat a lot of foods that are high in solid fats, added sugars, or sodium. Maintain a healthy weight Body mass index (BMI) is used to identify weight problems. It estimates body fat based on height and weight. Your health care provider can help determine your BMI and help you achieve or maintain a healthy weight. Get regular exercise Get regular exercise. This is one of the most important things you can do for your health. Most adults should:  Exercise for at least 150 minutes each week. The exercise should increase your heart rate and make you sweat (moderate-intensity exercise).  Do  strengthening exercises at least twice a week. This is in addition to the moderate-intensity exercise.  Spend less time sitting. Even light physical activity can be beneficial. Watch cholesterol and blood lipids Have your blood tested for lipids and cholesterol at 71 years of age, then have this test every 5 years. Have your cholesterol levels checked more often if:  Your lipid or cholesterol levels are high.  You are older than 71 years of age.  You are at high risk for heart disease. What should I know about cancer screening? Depending on your health history and family history, you may need to have cancer screening at various ages. This may include screening for:  Breast cancer.  Cervical cancer.  Colorectal cancer.  Skin cancer.  Lung cancer. What should I know about heart disease, diabetes, and high blood pressure? Blood pressure and heart disease  High blood pressure causes heart disease and increases the risk of stroke. This is more likely to develop in people who have high blood pressure readings, are of African descent, or are overweight.  Have your blood pressure checked: ? Every 3-5 years if you are 42-11 years of age. ? Every year if you are 53 years old or older. Diabetes Have regular diabetes screenings. This checks your fasting blood sugar level. Have the screening done:  Once every three years after  age 36 if you are at a normal weight and have a low risk for diabetes.  More often and at a younger age if you are overweight or have a high risk for diabetes. What should I know about preventing infection? Hepatitis B If you have a higher risk for hepatitis B, you should be screened for this virus. Talk with your health care provider to find out if you are at risk for hepatitis B infection. Hepatitis C Testing is recommended for:  Everyone born from [birth year] through 1967.  Anyone with known risk factors for hepatitis C. Sexually transmitted infections  (STIs)  Get screened for STIs, including gonorrhea and chlamydia, if: ? You are sexually active and are younger than 71 years of age. ? You are older than 71 years of age and your health care provider tells you that you are at risk for this type of infection. ? Your sexual activity has changed since you were last screened, and you are at increased risk for chlamydia or gonorrhea. Ask your health care provider if you are at risk.  Ask your health care provider about whether you are at high risk for HIV. Your health care provider may recommend a prescription medicine to help prevent HIV infection. If you choose to take medicine to prevent HIV, you should first get tested for HIV. You should then be tested every 3 months for as long as you are taking the medicine. Pregnancy  If you are about to stop having your period (premenopausal) and you may become pregnant, seek counseling before you get pregnant.  Take 400 to 800 micrograms (mcg) of folic acid every day if you become pregnant.  Ask for birth control (contraception) if you want to prevent pregnancy. Osteoporosis and menopause Osteoporosis is a disease in which the bones lose minerals and strength with aging. This can result in bone fractures. If you are 57 years old or older, or if you are at risk for osteoporosis and fractures, ask your health care provider if you should:  Be screened for bone loss.  Take a calcium or vitamin D supplement to lower your risk of fractures.  Be given hormone replacement therapy (HRT) to treat symptoms of menopause. Follow these instructions at home: Lifestyle  Do not use any products that contain nicotine or tobacco, such as cigarettes, e-cigarettes, and chewing tobacco. If you need help quitting, ask your health care provider.  Do not use street drugs.  Do not share needles.  Ask your health care provider for help if you need support or information about quitting drugs. Alcohol use  Do not drink  alcohol if: ? Your health care provider tells you not to drink. ? You are pregnant, may be pregnant, or are planning to become pregnant.  If you drink alcohol: ? Limit how much you use to 0-1 drink a day. ? Limit intake if you are breastfeeding.  Be aware of how much alcohol is in your drink. In the [place] one drink equals one 12 oz bottle of beer (355 mL), one 5 oz glass of wine (148 mL), or one 1 oz glass of hard liquor (44 mL). General instructions  Schedule regular health, dental, and eye exams.  Stay current with your vaccines.  Tell your health care provider if: ? You often feel depressed. ? You have ever been abused or do not feel safe at home. Summary  Adopting a healthy lifestyle and getting preventive care are important in promoting health and wellness.  Follow your  health care provider's instructions about healthy diet, exercising, and getting tested or screened for diseases.  Follow your health care provider's instructions on monitoring your cholesterol and blood pressure. This information is not intended to replace advice given to you by your health care provider. Make sure you discuss any questions you have with your health care provider. Document Revised: 05/25/2018 Document Reviewed: 05/25/2018 Elsevier Patient Education  2020 [employer].    [place] K. Tiffiny Worthy M.D.

## 2020-01-23 ENCOUNTER — Ambulatory Visit (INDEPENDENT_AMBULATORY_CARE_PROVIDER_SITE_OTHER): Payer: Medicare HMO | Admitting: Internal Medicine

## 2020-01-23 ENCOUNTER — Encounter: Payer: Self-pay | Admitting: Internal Medicine

## 2020-01-23 ENCOUNTER — Other Ambulatory Visit: Payer: Self-pay

## 2020-01-23 VITALS — BP 118/78 | HR 69 | Temp 97.6°F | Ht 64.0 in | Wt 180.8 lb

## 2020-01-23 DIAGNOSIS — Z7901 Long term (current) use of anticoagulants: Secondary | ICD-10-CM | POA: Diagnosis not present

## 2020-01-23 DIAGNOSIS — Z79899 Other long term (current) drug therapy: Secondary | ICD-10-CM

## 2020-01-23 DIAGNOSIS — I10 Essential (primary) hypertension: Secondary | ICD-10-CM

## 2020-01-23 DIAGNOSIS — Z0000 Encounter for general adult medical examination without abnormal findings: Secondary | ICD-10-CM

## 2020-01-23 DIAGNOSIS — J45909 Unspecified asthma, uncomplicated: Secondary | ICD-10-CM | POA: Diagnosis not present

## 2020-01-23 DIAGNOSIS — E785 Hyperlipidemia, unspecified: Secondary | ICD-10-CM | POA: Diagnosis not present

## 2020-01-23 NOTE — Patient Instructions (Signed)
Glad you are doing well.   Will notify you  of labs when available. And will share with cardiology .    Health Maintenance, Female Adopting a healthy lifestyle and getting preventive care are important in promoting health and wellness. Ask your health care provider about:  The right schedule for you to have regular tests and exams.  Things you can do on your own to prevent diseases and keep yourself healthy. What should I know about diet, weight, and exercise? Eat a healthy diet   Eat a diet that includes plenty of vegetables, fruits, low-fat dairy products, and lean protein.  Do not eat a lot of foods that are high in solid fats, added sugars, or sodium. Maintain a healthy weight Body mass index (BMI) is used to identify weight problems. It estimates body fat based on height and weight. Your health care provider can help determine your BMI and help you achieve or maintain a healthy weight. Get regular exercise Get regular exercise. This is one of the most important things you can do for your health. Most adults should:  Exercise for at least 150 minutes each week. The exercise should increase your heart rate and make you sweat (moderate-intensity exercise).  Do strengthening exercises at least twice a week. This is in addition to the moderate-intensity exercise.  Spend less time sitting. Even light physical activity can be beneficial. Watch cholesterol and blood lipids Have your blood tested for lipids and cholesterol at 71 years of age, then have this test every 5 years. Have your cholesterol levels checked more often if:  Your lipid or cholesterol levels are high.  You are older than 71 years of age.  You are at high risk for heart disease. What should I know about cancer screening? Depending on your health history and family history, you may need to have cancer screening at various ages. This may include screening for:  Breast cancer.  Cervical cancer.  Colorectal  cancer.  Skin cancer.  Lung cancer. What should I know about heart disease, diabetes, and high blood pressure? Blood pressure and heart disease  High blood pressure causes heart disease and increases the risk of stroke. This is more likely to develop in people who have high blood pressure readings, are of African descent, or are overweight.  Have your blood pressure checked: ? Every 3-5 years if you are 22-61 years of age. ? Every year if you are 70 years old or older. Diabetes Have regular diabetes screenings. This checks your fasting blood sugar level. Have the screening done:  Once every three years after age 89 if you are at a normal weight and have a low risk for diabetes.  More often and at a younger age if you are overweight or have a high risk for diabetes. What should I know about preventing infection? Hepatitis B If you have a higher risk for hepatitis B, you should be screened for this virus. Talk with your health care provider to find out if you are at risk for hepatitis B infection. Hepatitis C Testing is recommended for:  Everyone born from [birth year] through 1965.  Anyone with known risk factors for hepatitis C. Sexually transmitted infections (STIs)  Get screened for STIs, including gonorrhea and chlamydia, if: ? You are sexually active and are younger than 71 years of age. ? You are older than 71 years of age and your health care provider tells you that you are at risk for this type of infection. ? Your sexual  activity has changed since you were last screened, and you are at increased risk for chlamydia or gonorrhea. Ask your health care provider if you are at risk.  Ask your health care provider about whether you are at high risk for HIV. Your health care provider may recommend a prescription medicine to help prevent HIV infection. If you choose to take medicine to prevent HIV, you should first get tested for HIV. You should then be tested every 3 months for as long as  you are taking the medicine. Pregnancy  If you are about to stop having your period (premenopausal) and you may become pregnant, seek counseling before you get pregnant.  Take 400 to 800 micrograms (mcg) of folic acid every day if you become pregnant.  Ask for birth control (contraception) if you want to prevent pregnancy. Osteoporosis and menopause Osteoporosis is a disease in which the bones lose minerals and strength with aging. This can result in bone fractures. If you are 32 years old or older, or if you are at risk for osteoporosis and fractures, ask your health care provider if you should:  Be screened for bone loss.  Take a calcium or vitamin D supplement to lower your risk of fractures.  Be given hormone replacement therapy (HRT) to treat symptoms of menopause. Follow these instructions at home: Lifestyle  Do not use any products that contain nicotine or tobacco, such as cigarettes, e-cigarettes, and chewing tobacco. If you need help quitting, ask your health care provider.  Do not use street drugs.  Do not share needles.  Ask your health care provider for help if you need support or information about quitting drugs. Alcohol use  Do not drink alcohol if: ? Your health care provider tells you not to drink. ? You are pregnant, may be pregnant, or are planning to become pregnant.  If you drink alcohol: ? Limit how much you use to 0-1 drink a day. ? Limit intake if you are breastfeeding.  Be aware of how much alcohol is in your drink. In the [place] one drink equals one 12 oz bottle of beer (355 mL), one 5 oz glass of wine (148 mL), or one 1 oz glass of hard liquor (44 mL). General instructions  Schedule regular health, dental, and eye exams.  Stay current with your vaccines.  Tell your health care provider if: ? You often feel depressed. ? You have ever been abused or do not feel safe at home. Summary  Adopting a healthy lifestyle and getting preventive care are  important in promoting health and wellness.  Follow your health care provider's instructions about healthy diet, exercising, and getting tested or screened for diseases.  Follow your health care provider's instructions on monitoring your cholesterol and blood pressure. This information is not intended to replace advice given to you by your health care provider. Make sure you discuss any questions you have with your health care provider. Document Revised: 05/25/2018 Document Reviewed: 05/25/2018 Elsevier Patient Education  2020 [employer].

## 2020-01-24 LAB — CBC WITH DIFFERENTIAL/PLATELET
Absolute Monocytes: 525 cells/uL (ref 200–950)
Basophils Absolute: 21 cells/uL (ref 0–200)
Basophils Relative: 0.4 %
Eosinophils Absolute: 0 cells/uL — ABNORMAL LOW (ref 15–500)
Eosinophils Relative: 0 %
HCT: 42.9 % (ref 35.0–45.0)
Hemoglobin: 14.1 g/dL (ref 11.7–15.5)
Lymphs Abs: 1140 cells/uL (ref 850–3900)
MCH: 27.6 pg (ref 27.0–33.0)
MCHC: 32.9 g/dL (ref 32.0–36.0)
MCV: 84.1 fL (ref 80.0–100.0)
MPV: 9.9 fL (ref 7.5–12.5)
Monocytes Relative: 9.9 %
Neutro Abs: 3615 cells/uL (ref 1500–7800)
Neutrophils Relative %: 68.2 %
Platelets: 217 10*3/uL (ref 140–400)
RBC: 5.1 10*6/uL (ref 3.80–5.10)
RDW: 14.3 % (ref 11.0–15.0)
Total Lymphocyte: 21.5 %
WBC: 5.3 10*3/uL (ref 3.8–10.8)

## 2020-01-24 LAB — HEMOGLOBIN A1C
Hgb A1c MFr Bld: 5.9 % of total Hgb — ABNORMAL HIGH (ref <5.7)
Mean Plasma Glucose: 123 (calc)
eAG (mmol/L): 6.8 (calc)

## 2020-01-24 LAB — HEPATIC FUNCTION PANEL
AG Ratio: 1.6 (calc) (ref 1.0–2.5)
ALT: 32 U/L — ABNORMAL HIGH (ref 6–29)
AST: 21 U/L (ref 10–35)
Albumin: 4.5 g/dL (ref 3.6–5.1)
Alkaline phosphatase (APISO): 89 U/L (ref 37–153)
Bilirubin, Direct: 0.1 mg/dL (ref 0.0–0.2)
Globulin: 2.9 g/dL (calc) (ref 1.9–3.7)
Indirect Bilirubin: 0.3 mg/dL (calc) (ref 0.2–1.2)
Total Bilirubin: 0.4 mg/dL (ref 0.2–1.2)
Total Protein: 7.4 g/dL (ref 6.1–8.1)

## 2020-01-24 LAB — LIPID PANEL
Cholesterol: 283 mg/dL — ABNORMAL HIGH (ref <200)
HDL: 57 mg/dL (ref >=50)
LDL Cholesterol (Calc): 170 mg/dL (calc) — ABNORMAL HIGH
Non-HDL Cholesterol (Calc): 226 mg/dL (calc) — ABNORMAL HIGH (ref <130)
Total CHOL/HDL Ratio: 5 (calc) — ABNORMAL HIGH (ref <5.0)
Triglycerides: 330 mg/dL — ABNORMAL HIGH (ref <150)

## 2020-01-24 LAB — TSH: TSH: 1.18 mIU/L (ref 0.40–4.50)

## 2020-01-24 LAB — BASIC METABOLIC PANEL
BUN: 17 mg/dL (ref 7–25)
CO2: 24 mmol/L (ref 20–32)
Calcium: 9.5 mg/dL (ref 8.6–10.4)
Chloride: 101 mmol/L (ref 98–110)
Creat: 0.76 mg/dL (ref 0.60–0.93)
Glucose, Bld: 114 mg/dL — ABNORMAL HIGH (ref 65–99)
Potassium: 3.6 mmol/L (ref 3.5–5.3)
Sodium: 138 mmol/L (ref 135–146)

## 2020-01-26 NOTE — Progress Notes (Signed)
Cholesterol quite high ( even though this was not fasting sample)  minor liver abnormality , no anemia ,  blood sugar  no diabetes.  Thyroid normal . Forwarding to cardiology  Intensify lifestyle interventions.  Repeat fasting lipid and lfts in 4 months    And consider  adding cholesterol meds  or as per   cardiology opinion.

## 2020-01-30 ENCOUNTER — Other Ambulatory Visit: Payer: Self-pay

## 2020-01-30 DIAGNOSIS — E7800 Pure hypercholesterolemia, unspecified: Secondary | ICD-10-CM

## 2020-01-30 DIAGNOSIS — R7401 Elevation of levels of liver transaminase levels: Secondary | ICD-10-CM

## 2020-03-18 ENCOUNTER — Other Ambulatory Visit: Payer: Self-pay | Admitting: Internal Medicine

## 2020-03-21 ENCOUNTER — Other Ambulatory Visit: Payer: Self-pay | Admitting: *Deleted

## 2020-03-21 ENCOUNTER — Telehealth: Payer: Self-pay | Admitting: Internal Medicine

## 2020-03-21 MED ORDER — APIXABAN 5 MG PO TABS: 5.0000 mg | ORAL_TABLET | Freq: Two times a day (BID) | ORAL | 1 refills | Status: DC

## 2020-03-21 NOTE — Progress Notes (Signed)
  Chronic Care Management   Outreach Note  03/21/2020 Name: Penny Barnett MRN: [id number] DOB: [birth year]  Referred by: Madelin Headings, MD Reason for referral : No chief complaint on file.   An unsuccessful telephone outreach was attempted today. The patient was referred to the pharmacist for assistance with care management and care coordination.   Follow Up Plan:   Carley Perdue [employer] Scheduler

## 2020-03-21 NOTE — Telephone Encounter (Signed)
Prescription refill request for Eliquis received.  Last office visit: Penny Barnett 07/18/2019 Scr: 0.76, 01/23/2020 Age: 69  Weight: 82 kg   Prescription refill sent.

## 2020-03-27 ENCOUNTER — Telehealth: Payer: Self-pay | Admitting: Internal Medicine

## 2020-03-27 NOTE — Progress Notes (Signed)
  Chronic Care Management   Outreach Note  03/27/2020 Name: Penny Barnett MRN: [id number] DOB: 21-Feb-1951  Referred by: Madelin Headings, MD Reason for referral : No chief complaint on file.   A second unsuccessful telephone outreach was attempted today. The patient was referred to pharmacist for assistance with care management and care coordination.  Follow Up Plan:   Carley Perdue [employer] Scheduler

## 2020-04-02 ENCOUNTER — Other Ambulatory Visit: Payer: Self-pay | Admitting: Internal Medicine

## 2020-04-17 ENCOUNTER — Telehealth: Payer: Self-pay | Admitting: Internal Medicine

## 2020-04-17 ENCOUNTER — Other Ambulatory Visit: Payer: Self-pay | Admitting: Internal Medicine

## 2020-04-17 NOTE — Progress Notes (Signed)
  Chronic Care Management   Outreach Note  04/17/2020 Name: Penny Barnett MRN: [id number] DOB: 21-Feb-1951  Referred by: Madelin Headings, MD Reason for referral : No chief complaint on file.   Third unsuccessful telephone outreach was attempted today. The patient was referred to the pharmacist for assistance with care management and care coordination.   Follow Up Plan:   Carley Perdue [employer] Scheduler

## 2020-05-10 ENCOUNTER — Telehealth: Payer: Self-pay | Admitting: Family Medicine

## 2020-05-10 NOTE — Telephone Encounter (Signed)
Call from answering service- pt took a home test and was positive for covid 19.  She feel fine except for congestion No SOB, oxygen level 93-94 at home Advised her to call the covid 19 infusion hotline and provided phone number.  She will likely qualify for infusion based on her age

## 2020-05-11 ENCOUNTER — Other Ambulatory Visit: Payer: Self-pay | Admitting: Unknown Physician Specialty

## 2020-05-11 ENCOUNTER — Telehealth: Payer: Self-pay | Admitting: Unknown Physician Specialty

## 2020-05-11 ENCOUNTER — Encounter: Payer: Self-pay | Admitting: Unknown Physician Specialty

## 2020-05-11 DIAGNOSIS — U071 COVID-19: Secondary | ICD-10-CM

## 2020-05-11 DIAGNOSIS — I4892 Unspecified atrial flutter: Secondary | ICD-10-CM

## 2020-05-11 DIAGNOSIS — I10 Essential (primary) hypertension: Secondary | ICD-10-CM

## 2020-05-11 DIAGNOSIS — I429 Cardiomyopathy, unspecified: Secondary | ICD-10-CM

## 2020-05-11 NOTE — Telephone Encounter (Signed)
I connected by phone with Penny Barnett on 05/11/2020 at 10:23 AM to discuss the potential use of a new treatment for mild to moderate COVID-19 viral infection in non-hospitalized patients.  This patient is a 69 y.o. female that meets the FDA criteria for Emergency Use Authorization of COVID monoclonal antibody casirivimab/imdevimab, bamlanivimab/eteseviamb, or sotrovimab.  Has a (+) direct SARS-CoV-2 viral test result  Has mild or moderate COVID-19   Is NOT hospitalized due to COVID-19  Is within 10 days of symptom onset  Has at least one of the high risk factor(s) for progression to severe COVID-19 and/or hospitalization as defined in EUA.  Specific high risk criteria : Older age (>/= 71 yo)   I have spoken and communicated the following to the patient or parent/caregiver regarding COVID monoclonal antibody treatment:  1. FDA has authorized the emergency use for the treatment of mild to moderate COVID-19 in adults and pediatric patients with positive results of direct SARS-CoV-2 viral testing who are 52 years of age and older weighing at least 40 kg, and who are at high risk for progressing to severe COVID-19 and/or hospitalization.  2. The significant known and potential risks and benefits of COVID monoclonal antibody, and the extent to which such potential risks and benefits are unknown.  3. Information on available alternative treatments and the risks and benefits of those alternatives, including clinical trials.  4. Patients treated with COVID monoclonal antibody should continue to self-isolate and use infection control measures (e.g., wear mask, isolate, social distance, avoid sharing personal items, clean and disinfect "high touch" surfaces, and frequent handwashing) according to CDC guidelines.   5. The patient or parent/caregiver has the option to accept or refuse COVID monoclonal antibody treatment.  After reviewing this information with the patient, the patient has agreed  to receive one of the available covid 19 monoclonal antibodies and will be provided an appropriate fact sheet prior to infusion. Gabriel Cirri, NP 05/11/2020 10:23 AM Sx onset 11/22

## 2020-05-13 ENCOUNTER — Ambulatory Visit (HOSPITAL_COMMUNITY)
Admission: RE | Admit: 2020-05-13 | Discharge: 2020-05-13 | Disposition: A | Discharge status: Home / Self Care | Payer: Medicare HMO | Source: Ambulatory Visit | Attending: Pulmonary Disease | Admitting: Pulmonary Disease

## 2020-05-13 ENCOUNTER — Other Ambulatory Visit (HOSPITAL_COMMUNITY): Payer: Self-pay

## 2020-05-13 DIAGNOSIS — Z7901 Long term (current) use of anticoagulants: Secondary | ICD-10-CM | POA: Diagnosis not present

## 2020-05-13 DIAGNOSIS — U071 COVID-19: Secondary | ICD-10-CM

## 2020-05-13 DIAGNOSIS — I429 Cardiomyopathy, unspecified: Secondary | ICD-10-CM | POA: Insufficient documentation

## 2020-05-13 DIAGNOSIS — M19041 Primary osteoarthritis, right hand: Secondary | ICD-10-CM | POA: Diagnosis not present

## 2020-05-13 DIAGNOSIS — I4892 Unspecified atrial flutter: Secondary | ICD-10-CM | POA: Insufficient documentation

## 2020-05-13 DIAGNOSIS — J45909 Unspecified asthma, uncomplicated: Secondary | ICD-10-CM | POA: Diagnosis not present

## 2020-05-13 DIAGNOSIS — Z880 Allergy status to penicillin: Secondary | ICD-10-CM | POA: Diagnosis not present

## 2020-05-13 DIAGNOSIS — Z9842 Cataract extraction status, left eye: Secondary | ICD-10-CM | POA: Diagnosis not present

## 2020-05-13 DIAGNOSIS — S27399A Other injuries of lung, unspecified, initial encounter: Secondary | ICD-10-CM | POA: Diagnosis not present

## 2020-05-13 DIAGNOSIS — I10 Essential (primary) hypertension: Secondary | ICD-10-CM | POA: Insufficient documentation

## 2020-05-13 DIAGNOSIS — Z79899 Other long term (current) drug therapy: Secondary | ICD-10-CM | POA: Diagnosis not present

## 2020-05-13 DIAGNOSIS — E785 Hyperlipidemia, unspecified: Secondary | ICD-10-CM | POA: Diagnosis not present

## 2020-05-13 DIAGNOSIS — I480 Paroxysmal atrial fibrillation: Secondary | ICD-10-CM | POA: Diagnosis not present

## 2020-05-13 DIAGNOSIS — K219 Gastro-esophageal reflux disease without esophagitis: Secondary | ICD-10-CM | POA: Diagnosis not present

## 2020-05-13 DIAGNOSIS — R0602 Shortness of breath: Secondary | ICD-10-CM | POA: Diagnosis not present

## 2020-05-13 DIAGNOSIS — E860 Dehydration: Secondary | ICD-10-CM | POA: Diagnosis not present

## 2020-05-13 DIAGNOSIS — J9601 Acute respiratory failure with hypoxia: Secondary | ICD-10-CM | POA: Diagnosis not present

## 2020-05-13 DIAGNOSIS — J1282 Pneumonia due to coronavirus disease 2019: Secondary | ICD-10-CM | POA: Diagnosis not present

## 2020-05-13 DIAGNOSIS — Z87442 Personal history of urinary calculi: Secondary | ICD-10-CM | POA: Diagnosis not present

## 2020-05-13 DIAGNOSIS — Z23 Encounter for immunization: Secondary | ICD-10-CM | POA: Diagnosis not present

## 2020-05-13 DIAGNOSIS — M19042 Primary osteoarthritis, left hand: Secondary | ICD-10-CM | POA: Diagnosis not present

## 2020-05-13 DIAGNOSIS — Z87891 Personal history of nicotine dependence: Secondary | ICD-10-CM | POA: Diagnosis not present

## 2020-05-13 DIAGNOSIS — X58XXXA Exposure to other specified factors, initial encounter: Secondary | ICD-10-CM | POA: Diagnosis not present

## 2020-05-13 DIAGNOSIS — Z9841 Cataract extraction status, right eye: Secondary | ICD-10-CM | POA: Diagnosis not present

## 2020-05-13 MED ORDER — DIPHENHYDRAMINE HCL 50 MG/ML IJ SOLN: 50.0000 mg | Freq: Once | INTRAMUSCULAR | Status: DC | PRN

## 2020-05-13 MED ORDER — SOTROVIMAB 500 MG/8ML IV SOLN
500.0000 mg | Freq: Once | INTRAVENOUS | Status: AC
  Administered 2020-05-13: 500 mg via INTRAVENOUS

## 2020-05-13 MED ORDER — SODIUM CHLORIDE 0.9 % IV SOLN: INTRAVENOUS | Status: AC

## 2020-05-13 MED ORDER — FAMOTIDINE IN NACL 20-0.9 MG/50ML-% IV SOLN: 20.0000 mg | Freq: Once | INTRAVENOUS | Status: DC | PRN

## 2020-05-13 MED ORDER — SODIUM CHLORIDE 0.9 % IV SOLN: INTRAVENOUS | Status: DC | PRN

## 2020-05-13 MED ORDER — METHYLPREDNISOLONE SODIUM SUCC 125 MG IJ SOLR: 125.0000 mg | Freq: Once | INTRAMUSCULAR | Status: DC | PRN

## 2020-05-13 MED ORDER — ALBUTEROL SULFATE HFA 108 (90 BASE) MCG/ACT IN AERS: 2.0000 | INHALATION_SPRAY | Freq: Once | RESPIRATORY_TRACT | Status: DC | PRN

## 2020-05-13 MED ORDER — SODIUM CHLORIDE 0.9 % IV BOLUS: 1000.0000 mL | Freq: Once | INTRAVENOUS | Status: DC

## 2020-05-13 MED ORDER — EPINEPHRINE 0.3 MG/0.3ML IJ SOAJ: 0.3000 mg | Freq: Once | INTRAMUSCULAR | Status: DC | PRN

## 2020-05-13 MED ORDER — SODIUM CHLORIDE 0.9 % BOLUS PEDS: 1000.0000 mL | Freq: Once | INTRAVENOUS | Status: DC

## 2020-05-13 NOTE — Progress Notes (Signed)
Diagnosis: COVID-19  Physician: Dr. Patrick Wright  Procedure: Covid Infusion Clinic Med: Sotrovimab infusion - Provided patient with sotrovimab fact sheet for patients, parents, and caregivers prior to infusion.   Complications: No immediate complications noted  Discharge: Discharged home    

## 2020-05-13 NOTE — Progress Notes (Signed)
Received call from patient's nurse in infusion clinic that she is dehydrated and needs fluids.  Patient on Triamterene-HCTZ has h/o HTN and cardiomyopathy is noted on her problem list.  Considering these, I wrote for 500 mL NS over 1 hour.  I communicated that with nurse as well.    Lillard Anes, NP

## 2020-05-13 NOTE — Telephone Encounter (Signed)
THis message from nov 25   Just came to me today    I see that  You have planned MCABY infusion today which is my advice to 0proceed and  Had Op visit already  \ If  You need help with follow up then  Plan  Virtual visit

## 2020-05-13 NOTE — Progress Notes (Signed)
Patient reviewed Fact Sheet for Patients, Parents, and Caregivers for Emergency Use Authorization (EUA) of Sotrovimab for the Treatment of Coronavirus. Patient also reviewed and is agreeable to the estimated cost of treatment. Patient is agreeable to proceed.   

## 2020-05-14 NOTE — Discharge Instructions (Signed)

## 2020-05-16 ENCOUNTER — Inpatient Hospital Stay (HOSPITAL_COMMUNITY)
Admission: EM | Admit: 2020-05-16 | Discharge: 2020-05-20 | DRG: 177 | Disposition: A | Discharge status: Home / Self Care | Payer: Medicare HMO | Attending: Internal Medicine | Admitting: Internal Medicine

## 2020-05-16 ENCOUNTER — Emergency Department (HOSPITAL_COMMUNITY): Payer: Medicare HMO

## 2020-05-16 ENCOUNTER — Other Ambulatory Visit: Payer: Self-pay

## 2020-05-16 ENCOUNTER — Encounter (HOSPITAL_COMMUNITY): Payer: Self-pay | Admitting: Emergency Medicine

## 2020-05-16 DIAGNOSIS — E785 Hyperlipidemia, unspecified: Secondary | ICD-10-CM | POA: Diagnosis present

## 2020-05-16 DIAGNOSIS — Z79899 Other long term (current) drug therapy: Secondary | ICD-10-CM

## 2020-05-16 DIAGNOSIS — J9601 Acute respiratory failure with hypoxia: Secondary | ICD-10-CM | POA: Diagnosis present

## 2020-05-16 DIAGNOSIS — K219 Gastro-esophageal reflux disease without esophagitis: Secondary | ICD-10-CM | POA: Diagnosis present

## 2020-05-16 DIAGNOSIS — I480 Paroxysmal atrial fibrillation: Secondary | ICD-10-CM | POA: Diagnosis present

## 2020-05-16 DIAGNOSIS — S27399A Other injuries of lung, unspecified, initial encounter: Secondary | ICD-10-CM | POA: Diagnosis present

## 2020-05-16 DIAGNOSIS — J1282 Pneumonia due to coronavirus disease 2019: Secondary | ICD-10-CM | POA: Diagnosis not present

## 2020-05-16 DIAGNOSIS — J45909 Unspecified asthma, uncomplicated: Secondary | ICD-10-CM | POA: Diagnosis present

## 2020-05-16 DIAGNOSIS — M19042 Primary osteoarthritis, left hand: Secondary | ICD-10-CM | POA: Diagnosis present

## 2020-05-16 DIAGNOSIS — Z87891 Personal history of nicotine dependence: Secondary | ICD-10-CM | POA: Diagnosis not present

## 2020-05-16 DIAGNOSIS — Z9842 Cataract extraction status, left eye: Secondary | ICD-10-CM | POA: Diagnosis not present

## 2020-05-16 DIAGNOSIS — I10 Essential (primary) hypertension: Secondary | ICD-10-CM | POA: Diagnosis present

## 2020-05-16 DIAGNOSIS — Z880 Allergy status to penicillin: Secondary | ICD-10-CM

## 2020-05-16 DIAGNOSIS — X58XXXA Exposure to other specified factors, initial encounter: Secondary | ICD-10-CM | POA: Diagnosis present

## 2020-05-16 DIAGNOSIS — M19041 Primary osteoarthritis, right hand: Secondary | ICD-10-CM | POA: Diagnosis present

## 2020-05-16 DIAGNOSIS — Z87442 Personal history of urinary calculi: Secondary | ICD-10-CM | POA: Diagnosis not present

## 2020-05-16 DIAGNOSIS — Z9841 Cataract extraction status, right eye: Secondary | ICD-10-CM

## 2020-05-16 DIAGNOSIS — E860 Dehydration: Secondary | ICD-10-CM | POA: Diagnosis present

## 2020-05-16 DIAGNOSIS — U071 COVID-19: Secondary | ICD-10-CM | POA: Diagnosis not present

## 2020-05-16 DIAGNOSIS — R0602 Shortness of breath: Secondary | ICD-10-CM | POA: Diagnosis present

## 2020-05-16 DIAGNOSIS — Z7901 Long term (current) use of anticoagulants: Secondary | ICD-10-CM | POA: Diagnosis not present

## 2020-05-16 DIAGNOSIS — Z23 Encounter for immunization: Secondary | ICD-10-CM

## 2020-05-16 DIAGNOSIS — I429 Cardiomyopathy, unspecified: Secondary | ICD-10-CM | POA: Diagnosis present

## 2020-05-16 LAB — D-DIMER, QUANTITATIVE: D-Dimer, Quant: 0.9 ug/mL-FEU — ABNORMAL HIGH (ref 0.00–0.50)

## 2020-05-16 LAB — COMPREHENSIVE METABOLIC PANEL
ALT: 17 U/L (ref 0–44)
AST: 26 U/L (ref 15–41)
Albumin: 3 g/dL — ABNORMAL LOW (ref 3.5–5.0)
Alkaline Phosphatase: 54 U/L (ref 38–126)
Anion gap: 16 — ABNORMAL HIGH (ref 5–15)
BUN: 9 mg/dL (ref 8–23)
CO2: 24 mmol/L (ref 22–32)
Calcium: 8.9 mg/dL (ref 8.9–10.3)
Chloride: 97 mmol/L — ABNORMAL LOW (ref 98–111)
Creatinine, Ser: 0.56 mg/dL (ref 0.44–1.00)
GFR, Estimated: >60 mL/min (ref >60)
Glucose, Bld: 99 mg/dL (ref 70–99)
Potassium: 3.3 mmol/L — ABNORMAL LOW (ref 3.5–5.1)
Sodium: 137 mmol/L (ref 135–145)
Total Bilirubin: 1 mg/dL (ref 0.3–1.2)
Total Protein: 6.9 g/dL (ref 6.5–8.1)

## 2020-05-16 LAB — CBC WITH DIFFERENTIAL/PLATELET
Abs Immature Granulocytes: 0 10*3/uL (ref 0.00–0.07)
Basophils Absolute: 0 10*3/uL (ref 0.0–0.1)
Basophils Relative: 0 %
Eosinophils Absolute: 0 10*3/uL (ref 0.0–0.5)
Eosinophils Relative: 0 %
HCT: 38.4 % (ref 36.0–46.0)
Hemoglobin: 12.7 g/dL (ref 12.0–15.0)
Lymphocytes Relative: 11 %
Lymphs Abs: 0.5 10*3/uL — ABNORMAL LOW (ref 0.7–4.0)
MCH: 27.8 pg (ref 26.0–34.0)
MCHC: 33.1 g/dL (ref 30.0–36.0)
MCV: 84 fL (ref 80.0–100.0)
Metamyelocytes Relative: 1 %
Monocytes Absolute: 0 10*3/uL — ABNORMAL LOW (ref 0.1–1.0)
Monocytes Relative: 1 %
Neutro Abs: 4 10*3/uL (ref 1.7–7.7)
Neutrophils Relative %: 87 %
Platelets: 266 10*3/uL (ref 150–400)
RBC: 4.57 MIL/uL (ref 3.87–5.11)
RDW: 13.8 % (ref 11.5–15.5)
WBC: 4.6 10*3/uL (ref 4.0–10.5)
nRBC: 0 % (ref 0.0–0.2)
nRBC: 0 /100 WBC

## 2020-05-16 LAB — LACTIC ACID, PLASMA
Lactic Acid, Venous: 1.8 mmol/L (ref 0.5–1.9)
Lactic Acid, Venous: 2.2 mmol/L (ref 0.5–1.9)

## 2020-05-16 LAB — C-REACTIVE PROTEIN: CRP: 12.5 mg/dL — ABNORMAL HIGH (ref <1.0)

## 2020-05-16 LAB — CBG MONITORING, ED: Glucose-Capillary: 90 mg/dL (ref 70–99)

## 2020-05-16 LAB — FIBRINOGEN: Fibrinogen: 754 mg/dL — ABNORMAL HIGH (ref 210–475)

## 2020-05-16 LAB — FERRITIN: Ferritin: 643 ng/mL — ABNORMAL HIGH (ref 11–307)

## 2020-05-16 LAB — BRAIN NATRIURETIC PEPTIDE: B Natriuretic Peptide: 528.6 pg/mL — ABNORMAL HIGH (ref 0.0–100.0)

## 2020-05-16 LAB — PROCALCITONIN: Procalcitonin: <0.1 ng/mL

## 2020-05-16 LAB — TROPONIN I (HIGH SENSITIVITY)
Troponin I (High Sensitivity): 20 ng/L — ABNORMAL HIGH (ref <18)
Troponin I (High Sensitivity): 18 ng/L — ABNORMAL HIGH (ref <18)

## 2020-05-16 LAB — TRIGLYCERIDES: Triglycerides: 185 mg/dL — ABNORMAL HIGH (ref <150)

## 2020-05-16 LAB — LACTATE DEHYDROGENASE: LDH: 315 U/L — ABNORMAL HIGH (ref 98–192)

## 2020-05-16 MED ORDER — POTASSIUM CHLORIDE CRYS ER 20 MEQ PO TBCR
40.0000 meq | EXTENDED_RELEASE_TABLET | Freq: Once | ORAL | Status: AC
  Administered 2020-05-17: 40 meq via ORAL
  Filled 2020-05-16: qty 2

## 2020-05-16 MED ORDER — ROSUVASTATIN CALCIUM 5 MG PO TABS
5.0000 mg | ORAL_TABLET | Freq: Every day | ORAL | Status: DC
  Administered 2020-05-16 – 2020-05-20 (×5): 5 mg via ORAL
  Filled 2020-05-16 (×4): qty 1

## 2020-05-16 MED ORDER — SODIUM CHLORIDE 0.9 % IV SOLN
100.0000 mg | Freq: Every day | INTRAVENOUS | Status: AC
  Administered 2020-05-17 – 2020-05-20 (×4): 100 mg via INTRAVENOUS
  Filled 2020-05-16 (×4): qty 20
  Filled 2020-05-16: qty 100

## 2020-05-16 MED ORDER — MAGNESIUM OXIDE 400 (241.3 MG) MG PO TABS
800.0000 mg | ORAL_TABLET | Freq: Once | ORAL | Status: AC
  Administered 2020-05-17: 800 mg via ORAL
  Filled 2020-05-16: qty 2

## 2020-05-16 MED ORDER — BISOPROLOL FUMARATE 5 MG PO TABS
5.0000 mg | ORAL_TABLET | Freq: Every day | ORAL | Status: DC
  Administered 2020-05-16 – 2020-05-20 (×5): 5 mg via ORAL
  Filled 2020-05-16 (×6): qty 1

## 2020-05-16 MED ORDER — METHYLPREDNISOLONE SODIUM SUCC 125 MG IJ SOLR
1.0000 mg/kg | Freq: Two times a day (BID) | INTRAMUSCULAR | Status: DC
  Administered 2020-05-16 – 2020-05-17 (×3): 72.5 mg via INTRAVENOUS
  Filled 2020-05-16 (×3): qty 2

## 2020-05-16 MED ORDER — ZINC SULFATE 220 (50 ZN) MG PO CAPS
220.0000 mg | ORAL_CAPSULE | Freq: Every day | ORAL | Status: DC
  Administered 2020-05-16 – 2020-05-20 (×5): 220 mg via ORAL
  Filled 2020-05-16 (×5): qty 1

## 2020-05-16 MED ORDER — GUAIFENESIN-DM 100-10 MG/5ML PO SYRP: 10.0000 mL | ORAL_SOLUTION | ORAL | Status: DC | PRN

## 2020-05-16 MED ORDER — APIXABAN 5 MG PO TABS
5.0000 mg | ORAL_TABLET | Freq: Two times a day (BID) | ORAL | Status: DC
  Administered 2020-05-16 – 2020-05-20 (×8): 5 mg via ORAL
  Filled 2020-05-16 (×8): qty 1

## 2020-05-16 MED ORDER — BARICITINIB 2 MG PO TABS
4.0000 mg | ORAL_TABLET | Freq: Every day | ORAL | Status: DC
  Administered 2020-05-16 – 2020-05-20 (×5): 4 mg via ORAL
  Filled 2020-05-16 (×5): qty 2

## 2020-05-16 MED ORDER — INSULIN ASPART 100 UNIT/ML ~~LOC~~ SOLN: 0.0000 [IU] | Freq: Every day | SUBCUTANEOUS | Status: DC

## 2020-05-16 MED ORDER — DEXAMETHASONE SODIUM PHOSPHATE 10 MG/ML IJ SOLN
6.0000 mg | Freq: Once | INTRAMUSCULAR | Status: AC
  Administered 2020-05-16: 6 mg via INTRAVENOUS
  Filled 2020-05-16: qty 1

## 2020-05-16 MED ORDER — ASCORBIC ACID 500 MG PO TABS
500.0000 mg | ORAL_TABLET | Freq: Every day | ORAL | Status: DC
  Administered 2020-05-16 – 2020-05-20 (×5): 500 mg via ORAL
  Filled 2020-05-16 (×4): qty 1

## 2020-05-16 MED ORDER — VITAMIN D 25 MCG (1000 UNIT) PO TABS
1000.0000 [IU] | ORAL_TABLET | Freq: Every day | ORAL | Status: DC
  Administered 2020-05-16 – 2020-05-20 (×5): 1000 [IU] via ORAL
  Filled 2020-05-16 (×5): qty 1

## 2020-05-16 MED ORDER — ADULT MULTIVITAMIN W/MINERALS CH
1.0000 | ORAL_TABLET | Freq: Every day | ORAL | Status: DC
  Administered 2020-05-16 – 2020-05-20 (×5): 1 via ORAL
  Filled 2020-05-16 (×5): qty 1

## 2020-05-16 MED ORDER — INSULIN ASPART 100 UNIT/ML ~~LOC~~ SOLN: 0.0000 [IU] | Freq: Three times a day (TID) | SUBCUTANEOUS | Status: DC

## 2020-05-16 MED ORDER — MONTELUKAST SODIUM 10 MG PO TABS
10.0000 mg | ORAL_TABLET | Freq: Every day | ORAL | Status: DC
  Administered 2020-05-16 – 2020-05-19 (×3): 10 mg via ORAL
  Filled 2020-05-16 (×4): qty 1

## 2020-05-16 MED ORDER — ALBUTEROL SULFATE HFA 108 (90 BASE) MCG/ACT IN AERS
2.0000 | INHALATION_SPRAY | Freq: Four times a day (QID) | RESPIRATORY_TRACT | Status: DC | PRN
  Filled 2020-05-16: qty 6.7

## 2020-05-16 MED ORDER — IPRATROPIUM-ALBUTEROL 20-100 MCG/ACT IN AERS
1.0000 | INHALATION_SPRAY | Freq: Four times a day (QID) | RESPIRATORY_TRACT | Status: DC
  Administered 2020-05-17 – 2020-05-19 (×8): 1 via RESPIRATORY_TRACT
  Filled 2020-05-16: qty 4

## 2020-05-16 MED ORDER — SODIUM CHLORIDE 0.9 % IV SOLN
200.0000 mg | Freq: Once | INTRAVENOUS | Status: AC
  Administered 2020-05-17: 200 mg via INTRAVENOUS
  Filled 2020-05-16: qty 40

## 2020-05-16 MED ORDER — PREDNISONE 5 MG PO TABS: 50.0000 mg | ORAL_TABLET | Freq: Every day | ORAL | Status: DC

## 2020-05-16 MED ORDER — PANTOPRAZOLE SODIUM 40 MG PO TBEC
40.0000 mg | DELAYED_RELEASE_TABLET | Freq: Every day | ORAL | Status: DC
  Administered 2020-05-16 – 2020-05-20 (×5): 40 mg via ORAL
  Filled 2020-05-16 (×5): qty 1

## 2020-05-16 MED ORDER — METOPROLOL TARTRATE 5 MG/5ML IV SOLN
2.5000 mg | Freq: Four times a day (QID) | INTRAVENOUS | Status: DC | PRN
  Administered 2020-05-17: 2.5 mg via INTRAVENOUS
  Filled 2020-05-16: qty 5

## 2020-05-16 NOTE — ED Provider Notes (Signed)
[facility] EMERGENCY DEPARTMENT Provider Note   CSN: [id number] Arrival date & time: 05/16/20  1710     History Chief Complaint  Patient presents with   Covid Shortness of Breath    Penny Barnett is a 69 y.o. female.  The history is provided by the patient and medical records.   Penny Barnett is a 69 y.o. female who presents to the Emergency Department complaining of shortness of breath. She presents the emergency apartment complaining of increased shortness of breath that started today. About one week ago she was diagnosed with COVID-19. She had the monoclonal antibody infusion a few days ago. Today she developed increased shortness of breath and dyspnea on exertion. She does have a history of atrial fibrillation. She states she feels like her heart is beating quickly. Denies chest pain, hemoptysis, fevers, nausea, vomiting. Symptoms are moderate to severe, constant, worsening. She has not been vaccinated for COVID-19.    Past Medical History:  Diagnosis Date   Allergy    Anemia    Arthritis    hands   Asthma    as a child and then attack 1996  poss allergic to cat hair. ventilated x 2 . saw Dr Pelham Callas then .    Atrial flutter (HCC)    a. 12/2014 Echo: EF 60-65%, no rwma, mild MR;  b. S/P TEE/DCCV;  c. CHA2DS2VASc = 3-->Eliquis.   Cataract    Had surgery to remove   GERD (gastroesophageal reflux disease)    2000 taking otc prevacid   evey day.    Hyperlipidemia    Hypertension     3 meds controlled   Kidney stone    passed stone - no surgery required    Patient Active Problem List   Diagnosis Date Noted   Pneumonia due to COVID-19 virus 05/16/2020   Collagenous colitis 07/25/2016   Chronic anticoagulation 02/12/2015   Diarrhea 01/29/2015   Cardiomyopathy (HCC)    Atrial flutter with RVR 12/31/2014   Fatigue 12/30/2014   Hypokalemia 12/30/2014   Skin lesion poss AK  face. 10/16/2013   Encounter for routine  gynecological examination 10/14/2012   LFTs abnormal 10/07/2011   Visit for preventive health examination 10/07/2011   Diverticulosis 09/20/2011   History of kidney stones 04/19/2011   Asthma    GERD (gastroesophageal reflux disease)    Hypertension    Hyperlipidemia     Past Surgical History:  Procedure Laterality Date   CARDIOVERSION N/A 01/03/2015   Procedure: CARDIOVERSION;  Surgeon: Pricilla Riffle, MD;  Location: [facility] ENDOSCOPY;  Service: Cardiovascular;  Laterality: N/A;   CATARACT EXTRACTION Bilateral 10/14/2010   both eyes   COLONOSCOPY  02/2011   [facility] SURGERY Bilateral    x 2 - frozen shoulder rt 2006, 2000 left    TEE WITHOUT CARDIOVERSION N/A 01/03/2015   Procedure: TRANSESOPHAGEAL ECHOCARDIOGRAM (TEE);  Surgeon: Pricilla Riffle, MD;  Location: [facility] ENDOSCOPY;  Service: Cardiovascular;  Laterality: N/A;   TONSILLECTOMY       OB History   No obstetric history on file.     Family History  Problem Relation Age of Onset   Breast cancer Mother    Bladder Cancer Mother    Uterine cancer Sister    Alcohol abuse Father    Breast cancer Maternal Grandmother    Colon cancer Neg Hx    Heart attack Neg Hx    Stroke Neg Hx    Hypertension Neg Hx  Esophageal cancer Neg Hx    Rectal cancer Neg Hx    Stomach cancer Neg Hx     Social History   Tobacco Use   Smoking status: Former Smoker    Packs/day: 1.00    Years: 20.00    Pack years: 20.00    Types: Cigarettes    Quit date: 06/21/1986    Years since quitting: 33.9   Smokeless tobacco: Never Used  Vaping Use   Vaping Use: Never used  Substance Use Topics   Alcohol use: No    Alcohol/week: 0.0 standard drinks   Drug use: No    Home Medications Prior to Admission medications   Medication Sig Start Date End Date Taking? Authorizing Provider  albuterol (VENTOLIN HFA) 108 (90 Base) MCG/ACT inhaler INHALE 2 PUFFS INTO THE LUNGS EVERY 6 HOURS AS NEEDED FOR WHEEZING OR SHORTNESS  OF BREATH 03/19/20   Panosh, Neta Mends, MD  apixaban (ELIQUIS) 5 MG TABS tablet Take 1 tablet (5 mg total) by mouth 2 (two) times daily. 03/21/20   Pricilla Riffle, MD  bisoprolol (ZEBETA) 5 MG tablet Take 1 tablet (5 mg total) by mouth daily. 07/18/19   Berton Bon, NP  cholecalciferol (VITAMIN D) 1000 UNITS tablet Take 1,000 Units by mouth daily.      [provider]  cyclobenzaprine (FLEXERIL) 5 MG tablet Take 1 tablet (5 mg total) by mouth 3 (three) times daily as needed for muscle spasms. 04/21/19   Panosh, Neta Mends, MD  fluticasone (FLOVENT HFA) 110 MCG/ACT inhaler INHALE 1 PUFF BY MOUTH TWICE A DAY 04/17/20   Panosh, Neta Mends, MD  ipratropium (ATROVENT HFA) 17 MCG/ACT inhaler USE 2 INHALATIONS ORALLY   TWICE DAILY 09/04/19   Panosh, Neta Mends, MD  IRON PO Take by mouth daily.     [provider]  KLOR-CON M20 20 MEQ tablet TAKE 1 TABLET BY MOUTH EVERY DAY 04/02/20   Panosh, Neta Mends, MD  lansoprazole (PREVACID) 15 MG capsule Take 15 mg by mouth daily at 12 noon.    [provider]  montelukast (SINGULAIR) 10 MG tablet TAKE 1 TABLET BY MOUTH EVERY DAY IN THE MORNING 03/19/20   Panosh, Neta Mends, MD  Multiple Vitamins-Minerals (MULTIVITAMIN WITH MINERALS) tablet Take 1 tablet by mouth daily.      [provider]  rosuvastatin (CRESTOR) 10 MG tablet Take 5 mg by mouth daily.     [provider]  triamterene-hydrochlorothiazide (MAXZIDE-25) 37.5-25 MG tablet Take 0.5 tablets by mouth daily. 09/04/19   Pricilla Riffle, MD    Allergies    Penicillins  Review of Systems   Review of Systems  All other systems reviewed and are negative.   Physical Exam Updated Vital Signs BP 127/79    Pulse 88    Temp 98.5 F (36.9 C) (Oral)    Resp 18    Ht 5\' 4"  (1.626 m)    Wt 72.6 kg    SpO2 96%    BMI 27.46 kg/m   Physical Exam Vitals and nursing note reviewed.  Constitutional:      Appearance: She is well-developed.  HENT:     Head: Normocephalic and atraumatic.   Cardiovascular:     Rate and Rhythm: Tachycardia present. Rhythm irregular.     Heart sounds: No murmur heard.   Pulmonary:     Effort: Pulmonary effort is normal. No respiratory distress.     Comments: Occasional end expiratory wheezes Abdominal:     Palpations: Abdomen is  soft.     Tenderness: There is no abdominal tenderness. There is no guarding or rebound.  Musculoskeletal:        General: No swelling or tenderness.  Skin:    General: Skin is warm and dry.  Neurological:     Mental Status: She is alert and oriented to person, place, and time.  Psychiatric:        Behavior: Behavior normal.     ED Results / Procedures / Treatments   Labs (all labs ordered are listed, but only abnormal results are displayed) Labs Reviewed  CBC WITH DIFFERENTIAL/PLATELET - Abnormal; Notable for the following components:      Result Value   Lymphs Abs 0.5 (*)    Monocytes Absolute 0.0 (*)    All other components within normal limits  COMPREHENSIVE METABOLIC PANEL - Abnormal; Notable for the following components:   Potassium 3.3 (*)    Chloride 97 (*)    Albumin 3.0 (*)    Anion gap 16 (*)    All other components within normal limits  D-DIMER, QUANTITATIVE (NOT [facility]) - Abnormal; Notable for the following components:   D-Dimer, Quant 0.90 (*)    All other components within normal limits  LACTATE DEHYDROGENASE - Abnormal; Notable for the following components:   LDH 315 (*)    All other components within normal limits  FERRITIN - Abnormal; Notable for the following components:   Ferritin 643 (*)    All other components within normal limits  TRIGLYCERIDES - Abnormal; Notable for the following components:   Triglycerides 185 (*)    All other components within normal limits  FIBRINOGEN - Abnormal; Notable for the following components:   Fibrinogen 754 (*)    All other components within normal limits  C-REACTIVE PROTEIN - Abnormal; Notable for the following components:   CRP 12.5 (*)     All other components within normal limits  BRAIN NATRIURETIC PEPTIDE - Abnormal; Notable for the following components:   B Natriuretic Peptide 528.6 (*)    All other components within normal limits  TROPONIN I (HIGH SENSITIVITY) - Abnormal; Notable for the following components:   Troponin I (High Sensitivity) 20 (*)    All other components within normal limits  CULTURE, BLOOD (ROUTINE X 2)  CULTURE, BLOOD (ROUTINE X 2)  LACTIC ACID, PLASMA  PROCALCITONIN  LACTIC ACID, PLASMA  CBC WITH DIFFERENTIAL/PLATELET  COMPREHENSIVE METABOLIC PANEL  C-REACTIVE PROTEIN  D-DIMER, QUANTITATIVE (NOT AT [facility])  FERRITIN  MAGNESIUM  PHOSPHORUS  TROPONIN I (HIGH SENSITIVITY)    EKG None  Radiology DG Chest Port 1 View  Result Date: 05/16/2020 CLINICAL DATA:  Increased shortness of breath, COVID-19 diagnosed 5 days ago, tachycardia EXAM: PORTABLE CHEST 1 VIEW COMPARISON:  12/30/2014 FINDINGS: Single frontal view of the chest demonstrates an unremarkable cardiac silhouette. There are diffuse interstitial and ground-glass opacities, with relative sparing of the right lung base, consistent with history of COVID 19 pneumonia. No effusion or pneumothorax. IMPRESSION: 1. Multifocal bilateral pneumonia consistent with COVID 19. Electronically Signed   By: Sharlet SalinaMichael  Brown M.D.   On: 05/16/2020 18:32    Procedures Procedures (including critical care time)  Medications Ordered in ED Medications  remdesivir 200 mg in sodium chloride 0.9% 250 mL IVPB (has no administration in time range)    Followed by  remdesivir 100 mg in sodium chloride 0.9 % 100 mL IVPB (has no administration in time range)  baricitinib (OLUMIANT) tablet 4 mg (has no administration in time range)  Ipratropium-Albuterol (COMBIVENT) respimat 1 puff (has no administration in time range)  methylPREDNISolone sodium succinate (SOLU-MEDROL) 125 mg/2 mL injection 72.5 mg (has no administration in time range)    Followed by  predniSONE  (DELTASONE) tablet 50 mg (has no administration in time range)  guaiFENesin-dextromethorphan (ROBITUSSIN DM) 100-10 MG/5ML syrup 10 mL (has no administration in time range)  insulin aspart (novoLOG) injection 0-9 Units (has no administration in time range)  insulin aspart (novoLOG) injection 0-5 Units (has no administration in time range)  ascorbic acid (VITAMIN C) tablet 500 mg (has no administration in time range)  zinc sulfate capsule 220 mg (has no administration in time range)  multivitamin with minerals tablet 1 tablet (has no administration in time range)  cholecalciferol (VITAMIN D3) tablet 1,000 Units (has no administration in time range)  dexamethasone (DECADRON) injection 6 mg (6 mg Intravenous Given 05/16/20 2059)    ED Course  I have reviewed the triage vital signs and the nursing notes.  Pertinent labs & imaging results that were available during my care of the patient were reviewed by me and considered in my medical decision making (see chart for details).    MDM Rules/Calculators/A&P                         Patient with history of atrial fibrillation, asthma here for evaluation of increased shortness of breath that started today. She is found to be in atrial fibrillation, heart rate 100 to 110s. She does have a history of a fib but does not appear to be in persistent impaired. She is anticoagulated. Chest x-ray consistent with COVID pneumonia. She does have new oxygen requirement of 3 L nasal cannula to maintain sats in the low 90s. She has no respiratory distress at this time. Will treat with Decadron for hypoxia related to COVID-19 infection. Plan to admit for ongoing treatment. Doubt PE, patient is anticoagulated and D dimer is relatively low given her COVID diagnosis.  Final Clinical Impression(s) / ED Diagnoses Final diagnoses:  Pneumonia due to COVID-19 virus  Paroxysmal atrial fibrillation [facility])    Rx / DC Orders ED Discharge Orders    None       Tilden Fossa, MD 05/16/20 2123

## 2020-05-16 NOTE — Telephone Encounter (Signed)
Patient with HRs 90s-120s today.  This started today.  Took usual dose of bisoprolol this am and has taken an extra tablet this afternoon. at urgent care visit pulse ox was 94%   At [facility] infusion 92% At home 88-92 %. Feels a little bit SOB, started with covid illness.  No fevers.  Encouraged hydration, taking vit D, C and Zinc.    HR usually 50s-60s. On phone 133/87, 106.  She is on day 10 of Covid symptoms.  I adv that she should be seen urgently at the ER for evaluation.

## 2020-05-16 NOTE — H&P (Signed)
History and Physical  Penny Barnett BBC:[id number] DOB: Mar 19, 1951 DOA: 05/16/2020  Referring physician: Dr. Madilyn Hook PCP: Fabian Sharp Neta Mends, MD  Outpatient Specialists: Cardiology, pulmonary Patient coming from: Home.  Chief Complaint: Shortness of breath.  HPI: Penny Barnett is a 69 y.o. female with medical history significant for paroxysmal A. fib on Eliquis, essential hypertension, hyperlipidemia, asthma, who presented to [facility] ED with complaints of gradually worsening shortness of breath since diagnosed with Covid-19 5 days ago.  Associated with generalized fatigue.  She denies subjective fevers, chest pain, nausea, vomiting, abdominal pain, or diarrhea.  She was diagnosed with COVID-19 on 05/11/2020 at urgent care.  She received monoclonal antibodies on 05/13/2020 at infusion center [facility].  She was discharged home with instruction for quarantine.  Hypoxic on presentation to the ED with O2 saturation in the mid 80s on room air, currently on 3 L with O2 saturation of 96%.  Chest x-ray consistent with COVID-19 viral pneumonia.  Received Decadron in the ED.  EDP requested admission for COVID-19 viral pneumonia management.  ED Course: Tachycardic with A. fib RVR, tachypneic and hypoxic.  Lab studies remarkable for elevated inflammatory markers CRP 12.5, D-dimer 0.90, procalcitonin negative.  BNP 528.  Troponin S 20.  Review of Systems: Review of systems as noted in the HPI. All other systems reviewed and are negative.   Past Medical History:  Diagnosis Date  . Allergy   . Anemia   . Arthritis    hands  . Asthma    as a child and then attack 1996  poss allergic to cat hair. ventilated x 2 . saw Dr Frank Callas then .   Marland Kitchen Atrial flutter (HCC)    a. 12/2014 Echo: EF 60-65%, no rwma, mild MR;  b. S/P TEE/DCCV;  c. CHA2DS2VASc = 3-->Eliquis.  . Cataract    Had surgery to remove  . GERD (gastroesophageal reflux disease)    2000 taking otc prevacid   evey day.   . Hyperlipidemia   . Hypertension      3 meds controlled  . Kidney stone    passed stone - no surgery required   Past Surgical History:  Procedure Laterality Date  . CARDIOVERSION N/A 01/03/2015   Procedure: CARDIOVERSION;  Surgeon: Pricilla Riffle, MD;  Location: [facility] ENDOSCOPY;  Service: Cardiovascular;  Laterality: N/A;  . CATARACT EXTRACTION Bilateral 10/14/2010   both eyes  . COLONOSCOPY  02/2011   Arlyce Dice  . SHOULDER SURGERY Bilateral    x 2 - frozen shoulder rt 2006, 2000 left   . TEE WITHOUT CARDIOVERSION N/A 01/03/2015   Procedure: TRANSESOPHAGEAL ECHOCARDIOGRAM (TEE);  Surgeon: Pricilla Riffle, MD;  Location: [facility] ENDOSCOPY;  Service: Cardiovascular;  Laterality: N/A;  . TONSILLECTOMY      Social History:  reports that she quit smoking about 33 years ago. Her smoking use included cigarettes. She has a 20.00 pack-year smoking history. She has never used smokeless tobacco. She reports that she does not drink alcohol and does not use drugs.   Allergies  Allergen Reactions  . Penicillins Other (See Comments)    Childhood      Family History  Problem Relation Age of Onset  . Breast cancer Mother   . Bladder Cancer Mother   . Uterine cancer Sister   . Alcohol abuse Father   . Breast cancer Maternal Grandmother   . Colon cancer Neg Hx   . Heart attack Neg Hx   . Stroke Neg Hx   . Hypertension Neg Hx   .  Esophageal cancer Neg Hx   . Rectal cancer Neg Hx   . Stomach cancer Neg Hx       Prior to Admission medications   Medication Sig Start Date End Date Taking? Authorizing Provider  albuterol (VENTOLIN HFA) 108 (90 Base) MCG/ACT inhaler INHALE 2 PUFFS INTO THE LUNGS EVERY 6 HOURS AS NEEDED FOR WHEEZING OR SHORTNESS OF BREATH 03/19/20   Panosh, Neta Mends, MD  apixaban (ELIQUIS) 5 MG TABS tablet Take 1 tablet (5 mg total) by mouth 2 (two) times daily. 03/21/20   Pricilla Riffle, MD  bisoprolol (ZEBETA) 5 MG tablet Take 1 tablet (5 mg total) by mouth daily. 07/18/19   Berton Bon, NP  cholecalciferol (VITAMIN D) 1000  UNITS tablet Take 1,000 Units by mouth daily.      [provider]  cyclobenzaprine (FLEXERIL) 5 MG tablet Take 1 tablet (5 mg total) by mouth 3 (three) times daily as needed for muscle spasms. 04/21/19   Panosh, Neta Mends, MD  fluticasone (FLOVENT HFA) 110 MCG/ACT inhaler INHALE 1 PUFF BY MOUTH TWICE A DAY 04/17/20   Panosh, Neta Mends, MD  ipratropium (ATROVENT HFA) 17 MCG/ACT inhaler USE 2 INHALATIONS ORALLY   TWICE DAILY 09/04/19   Panosh, Neta Mends, MD  IRON PO Take by mouth daily.     [provider]  KLOR-CON M20 20 MEQ tablet TAKE 1 TABLET BY MOUTH EVERY DAY 04/02/20   Panosh, Neta Mends, MD  lansoprazole (PREVACID) 15 MG capsule Take 15 mg by mouth daily at 12 noon.    [provider]  montelukast (SINGULAIR) 10 MG tablet TAKE 1 TABLET BY MOUTH EVERY DAY IN THE MORNING 03/19/20   Panosh, Neta Mends, MD  Multiple Vitamins-Minerals (MULTIVITAMIN WITH MINERALS) tablet Take 1 tablet by mouth daily.      [provider]  rosuvastatin (CRESTOR) 10 MG tablet Take 5 mg by mouth daily.     [provider]  triamterene-hydrochlorothiazide (MAXZIDE-25) 37.5-25 MG tablet Take 0.5 tablets by mouth daily. 09/04/19   Pricilla Riffle, MD    Physical Exam: BP (!) 121/92   Pulse (!) 115   Temp 98.5 F (36.9 C) (Oral)   Resp (!) 24   Ht 5\' 4"  (1.626 m)   Wt 72.6 kg   SpO2 98%   BMI 27.46 kg/m   . General: 69 y.o. year-old female well developed well nourished in no acute distress.  Alert and oriented x3. . Cardiovascular: Irregular rate and rhythm with no rubs or gallops.  No thyromegaly or JVD noted.  Trace lower extremity edema. 2/4 pulses in all 4 extremities. 62 Respiratory: Diffuse rales bilaterally with no wheezing noted. Good inspiratory effort. . Abdomen: Soft nontender nondistended with normal bowel sounds x4 quadrants. . Muskuloskeletal: No cyanosis or clubbing.  Trace edema noted in lower extremities bilaterally . Neuro: CN II-XII intact, strength, sensation,  reflexes . Skin: No ulcerative lesions noted or rashes . Psychiatry: Judgement and insight appear normal. Mood is appropriate for condition and setting          Labs on Admission:  Basic Metabolic Panel: Recent Labs  Lab 05/16/20 1745  NA 137  K 3.3*  CL 97*  CO2 24  GLUCOSE 99  BUN 9  CREATININE 0.56  CALCIUM 8.9   Liver Function Tests: Recent Labs  Lab 05/16/20 1745  AST 26  ALT 17  ALKPHOS 54  BILITOT 1.0  PROT 6.9  ALBUMIN 3.0*   No results for input(s): LIPASE, AMYLASE in  the last 168 hours. No results for input(s): AMMONIA in the last 168 hours. CBC: Recent Labs  Lab 05/16/20 1745  WBC 4.6  NEUTROABS 4.0  HGB 12.7  HCT 38.4  MCV 84.0  PLT 266   Cardiac Enzymes: No results for input(s): CKTOTAL, CKMB, CKMBINDEX, TROPONINI in the last 168 hours.  BNP (last 3 results) Recent Labs    05/16/20 1745  BNP 528.6*    ProBNP (last 3 results) No results for input(s): PROBNP in the last 8760 hours.  CBG: No results for input(s): GLUCAP in the last 168 hours.  Radiological Exams on Admission: DG Chest Port 1 View  Result Date: 05/16/2020 CLINICAL DATA:  Increased shortness of breath, COVID-19 diagnosed 5 days ago, tachycardia EXAM: PORTABLE CHEST 1 VIEW COMPARISON:  12/30/2014 FINDINGS: Single frontal view of the chest demonstrates an unremarkable cardiac silhouette. There are diffuse interstitial and ground-glass opacities, with relative sparing of the right lung base, consistent with history of COVID 19 pneumonia. No effusion or pneumothorax. IMPRESSION: 1. Multifocal bilateral pneumonia consistent with COVID 19. Electronically Signed   By: Sharlet SalinaMichael  Brown M.D.   On: 05/16/2020 18:32    EKG: I independently viewed the EKG done and my findings are as followed: A. fib with RVR, rate of 138.  Nonspecific ST-T changes.  Assessment/Plan Present on Admission: . Pneumonia due to COVID-19 virus  Active Problems:   Pneumonia due to COVID-19 virus  COVID-19  viral pneumonia Diagnosed with COVID-19 viral infection on 05/11/2020, unvaccinated Received monoclonal antibodies on 05/13/2020 Personally reviewed chest x-ray done on admission which showed bilateral pulmonary infiltrates consistent with COVID-19 viral pneumonia. Airborne precautions in place Start COVID-19 directed therapies, patient has given consent to use remdesivir and baricitinib. IV remdesivir x5 days, p.o. baricitinib daily x14 days, do not prescribe baricitinib if discharged earlier IV Solu-Medrol twice daily Bronchodilators Pronate as tolerated or side sleeping Mobilize as tolerated Vitamin C, D3 and zinc Maintain O2 saturation greater than 92% Trend inflammatory markers  Acute hypoxic respiratory failure secondary to COVID-19 viral pneumonia Not on oxygen supplementation at baseline Presented with O2 saturation in the mid 80s on room air Currently on 3 L with O2 saturation of 96% Management as stated above Add incentive spirometer and flutter valve Will need home O2 evaluation prior to DC  Paroxysmal A. fib with RVR, rate is currently not controlled Presented with A. fib with RVR rate of 138 Rate is currently not controlled in the 110's at rest Resume home bisoprolol Add as needed IV Lopressor with parameters Resume Eliquis for primary CVA prevention Obtain 2D echo and TSH due to uncontrolled A. Fib  Elevated troponin, likely demand ischemia in the setting of acute hypoxic respiratory failure and A. fib with RVR. She denies any chest pain or anginal symptoms Troponin 20 on admission, repeat x1 No evidence of acute ischemia on twelve-lead EKG 2D echo has been ordered, follow results Continue to monitor on telemetry  History of asthma Home bronchodilators IV steroids as stated above Maintain O2 saturation greater than 92%  GERD Stable Resume PPI  Hyperlipidemia Resume home Crestor  Essential hypertension BP is currently at goal Resume home  bisoprolol Hold off home Maxzide Monitor vital signs   DVT prophylaxis: Eliquis  Code Status: DO NOT INTUBATE as stated by the patient herself.  She is okay with CPR.  Family Communication: None at bedside.  Disposition Plan: Admit to telemetry medical  Consults called: None.  Admission status: Inpatient status.  Patient will require at least  2 midnights for further evaluation and treatment of present condition.  Status is: Inpatient    Dispo:  Patient From: Home  Planned Disposition: Home  Expected discharge date: 05/20/20  Medically stable for discharge: No, ongoing management of COVID-19 viral pneumonia and acute hypoxic respiratory failure.        Darlin Drop MD [facility] Pager [phone]  If 7PM-7AM, please contact night-coverage [email or web address] Password [facility]  05/16/2020, 9:13 PM

## 2020-05-16 NOTE — Progress Notes (Signed)
Called patient's sister Ms. Burns x 3.  No answer.  Left voicemail message.

## 2020-05-16 NOTE — Progress Notes (Signed)
Pt's sister Ms. Burns called and I told her Ms. Yount had PNA and with a fib RVR she would need to stay- her sister waiitng in parking lot for a few hours will go home . She would like updates if possible  [phone]

## 2020-05-16 NOTE — ED Notes (Signed)
Eliot Ford (pt sister) returning doctor call please try home phone number [phone]

## 2020-05-16 NOTE — ED Triage Notes (Addendum)
Pt to ED with c/o increased shortness of breath since being diagnoses with Covid 5 days ago.   Pt also had the infusion on Monday.  Pt also c/o rapid heart rate  Pt has hx of A-Fib Pt placed on 02 in triage at 2 LPM via [place]

## 2020-05-17 ENCOUNTER — Telehealth: Payer: Self-pay | Admitting: Internal Medicine

## 2020-05-17 ENCOUNTER — Inpatient Hospital Stay (HOSPITAL_COMMUNITY): Payer: Medicare HMO

## 2020-05-17 ENCOUNTER — Encounter (HOSPITAL_COMMUNITY): Payer: Self-pay | Admitting: Internal Medicine

## 2020-05-17 DIAGNOSIS — I480 Paroxysmal atrial fibrillation: Secondary | ICD-10-CM

## 2020-05-17 DIAGNOSIS — J9601 Acute respiratory failure with hypoxia: Secondary | ICD-10-CM

## 2020-05-17 LAB — CBC WITH DIFFERENTIAL/PLATELET
Abs Immature Granulocytes: 0.03 10*3/uL (ref 0.00–0.07)
Basophils Absolute: 0 10*3/uL (ref 0.0–0.1)
Basophils Relative: 0 %
Eosinophils Absolute: 0 10*3/uL (ref 0.0–0.5)
Eosinophils Relative: 0 %
HCT: 40.6 % (ref 36.0–46.0)
Hemoglobin: 12.5 g/dL (ref 12.0–15.0)
Immature Granulocytes: 1 %
Lymphocytes Relative: 9 %
Lymphs Abs: 0.3 10*3/uL — ABNORMAL LOW (ref 0.7–4.0)
MCH: 26.9 pg (ref 26.0–34.0)
MCHC: 30.8 g/dL (ref 30.0–36.0)
MCV: 87.5 fL (ref 80.0–100.0)
Monocytes Absolute: 0.1 10*3/uL (ref 0.1–1.0)
Monocytes Relative: 2 %
Neutro Abs: 3 10*3/uL (ref 1.7–7.7)
Neutrophils Relative %: 88 %
Platelets: 239 10*3/uL (ref 150–400)
RBC: 4.64 MIL/uL (ref 3.87–5.11)
RDW: 13.9 % (ref 11.5–15.5)
WBC: 3.4 10*3/uL — ABNORMAL LOW (ref 4.0–10.5)
nRBC: 0 % (ref 0.0–0.2)

## 2020-05-17 LAB — COMPREHENSIVE METABOLIC PANEL
ALT: 19 U/L (ref 0–44)
AST: 39 U/L (ref 15–41)
Albumin: 2.7 g/dL — ABNORMAL LOW (ref 3.5–5.0)
Alkaline Phosphatase: 49 U/L (ref 38–126)
Anion gap: 15 (ref 5–15)
BUN: 12 mg/dL (ref 8–23)
CO2: 24 mmol/L (ref 22–32)
Calcium: 8.6 mg/dL — ABNORMAL LOW (ref 8.9–10.3)
Chloride: 97 mmol/L — ABNORMAL LOW (ref 98–111)
Creatinine, Ser: 0.51 mg/dL (ref 0.44–1.00)
GFR, Estimated: >60 mL/min (ref >60)
Glucose, Bld: 178 mg/dL — ABNORMAL HIGH (ref 70–99)
Potassium: 4.4 mmol/L (ref 3.5–5.1)
Sodium: 136 mmol/L (ref 135–145)
Total Bilirubin: 1.1 mg/dL (ref 0.3–1.2)
Total Protein: 6.7 g/dL (ref 6.5–8.1)

## 2020-05-17 LAB — C-REACTIVE PROTEIN: CRP: 10.9 mg/dL — ABNORMAL HIGH (ref <1.0)

## 2020-05-17 LAB — CBG MONITORING, ED
Glucose-Capillary: 166 mg/dL — ABNORMAL HIGH (ref 70–99)
Glucose-Capillary: 206 mg/dL — ABNORMAL HIGH (ref 70–99)
Glucose-Capillary: 184 mg/dL — ABNORMAL HIGH (ref 70–99)

## 2020-05-17 LAB — D-DIMER, QUANTITATIVE: D-Dimer, Quant: 0.88 ug/mL-FEU — ABNORMAL HIGH (ref 0.00–0.50)

## 2020-05-17 LAB — TSH: TSH: 0.265 u[IU]/mL — ABNORMAL LOW (ref 0.350–4.500)

## 2020-05-17 LAB — ECHOCARDIOGRAM LIMITED
Area-P 1/2: 3.96 cm2
Height: 64 in
S' Lateral: 2.9 cm
Weight: 2560 oz

## 2020-05-17 LAB — FERRITIN: Ferritin: 599 ng/mL — ABNORMAL HIGH (ref 11–307)

## 2020-05-17 LAB — PHOSPHORUS: Phosphorus: 4.2 mg/dL (ref 2.5–4.6)

## 2020-05-17 LAB — MAGNESIUM: Magnesium: 1.9 mg/dL (ref 1.7–2.4)

## 2020-05-17 MED ORDER — BENZONATATE 100 MG PO CAPS
200.0000 mg | ORAL_CAPSULE | Freq: Three times a day (TID) | ORAL | Status: DC
  Administered 2020-05-17 – 2020-05-20 (×10): 200 mg via ORAL
  Filled 2020-05-17 (×10): qty 2

## 2020-05-17 MED ORDER — ACETAMINOPHEN 325 MG PO TABS: 650.0000 mg | ORAL_TABLET | Freq: Four times a day (QID) | ORAL | Status: DC | PRN

## 2020-05-17 MED ORDER — ONDANSETRON HCL 4 MG/2ML IJ SOLN: 4.0000 mg | Freq: Four times a day (QID) | INTRAMUSCULAR | Status: DC | PRN

## 2020-05-17 NOTE — Progress Notes (Signed)
°  Echocardiogram 2D Echocardiogram has been attempted. Patient receiving personal care. Will reattempt at later time.  Dorena Dew Cintia Gleed 05/17/2020, 9:56 AM

## 2020-05-17 NOTE — Telephone Encounter (Signed)
Pt is currently admitted at the ED.

## 2020-05-17 NOTE — Telephone Encounter (Signed)
Late entry for 05/17/20  9:45 am  Reached out to card master at hospital and adv that patient's sister is asking to be updated.   She was going to make patient's care team aware.

## 2020-05-17 NOTE — Telephone Encounter (Signed)
° °  Pt's sister calling, she said pt is in the ED right now, she has not heard from anyone and doesn't know what's going on with her sister. Last night she spoke with Nada Boozer and was given information. She would like to speak with her again or Dr. Charlott Rakes nurse

## 2020-05-17 NOTE — Progress Notes (Signed)
  Echocardiogram 2D Echocardiogram has been performed.  Larua Collier G Toy Samarin 05/17/2020, 11:29 AM

## 2020-05-17 NOTE — Progress Notes (Signed)
PROGRESS NOTE                                                                                                                                                                                                             Patient Demographics:    Penny Barnett, is a 69 y.o. female, DOB - November 20, 1950, W[facility]:[id number]  Outpatient Primary MD for the patient is Panosh, Neta Mends, MD   Admit date - 05/16/2020   LOS - 1  Chief Complaint  Patient presents with  . Covid Shortness of Breath       Brief Narrative: Patient is a 69 y.o. female with PMHx of PAF on Eliquis, HTN, HLD, bronchial asthma-who was diagnosed with COVID-19 on 11/27-received monoclonal antibody infusion as an outpatient on 11/29-presented to the hospital with several days history of shortness of breath, she was found to have acute hypoxic respiratory failure due to COVID-19 pneumonia and admitted to the hospitalist service.  See below for further details.  COVID-19 vaccinated status: Unvaccinated  Significant Events: 11/27>> COVID-19 positive (results in care everywhere) 11/29>> MAB infusion x1 12/2>> Admit to [facility] for hypoxia due to COVID-19 infection  Significant studies: 12/2>>Chest x-ray: Multifocal pneumonia  COVID-19 medications: Steroids: 12/2>> Remdesivir: 12/2>> Baricitinib: 12/2>> Monoclonal antibody infusion: 11/29 x 1  Antibiotics: None  Microbiology data: 12/2 >>blood culture: Pending  Procedures: None  Consults: None  DVT prophylaxis: apixaban (ELIQUIS) tablet 5 mg     Subjective:    Penny Barnett today feels slightly better-continues to cough.  Stable on 2-2 L of oxygen.   Assessment  & Plan :   Acute Hypoxic Resp Failure due to Covid 19 Viral pneumonia: Stable/mild hypoxia-on around 2-3 L of oxygen.  Continue steroids/Remdesivir/baricitinib.  She has no history of tuberculosis, hepatitis B, diverticulitis-and consents to the  continued use of baricitinib (started on admission).  Fever: afebrile O2 requirements:  SpO2: 93 % O2 Flow Rate (L/min): 3 L/min   COVID-19 Labs: Recent Labs    05/16/20 1745 05/17/20 0459  DDIMER 0.90* 0.88*  FERRITIN 643* 599*  LDH 315*  --   CRP 12.5* 10.9*       Component Value Date/Time   BNP 528.6 (H) 05/16/2020 1745    Recent Labs  Lab 05/16/20 1745  PROCALCITON <0.10    No results found for: SARSCOV2NAA   Prone/Incentive Spirometry:  encouraged patient to lie prone for 3-4 hours at a time for a total of 16 hours a day, and to encourage incentive spirometry use 3-4/hour.  A. fib with RVR: Rate better controlled-continue bisoprolol-remains on Eliquis.  HLD: Continue statin  Bronchial asthma: No exacerbation-continue bronchodilators  GERD: Continue PPI  GI prophylaxis: PPI  ABG:    Component Value Date/Time   TCO2 24 12/30/2014 1441    Vent Settings: N/A  Condition - Guarded  Family Communication  :  Sister Dionne Milo [phone]) updated over the phone-12/3  Code Status :  DNI  Diet :  Diet Order            Diet Heart Room service appropriate? Yes; Fluid consistency: Thin  Diet effective now                  Disposition Plan  :   Status is: Inpatient  Remains inpatient appropriate because:Inpatient level of care appropriate due to severity of illness   Dispo:  Patient From: Home  Planned Disposition: Home  Expected discharge date: 05/20/20  Medically stable for discharge: No    Barriers to discharge: Hypoxia requiring O2 supplementation/complete 5 days of IV Remdesivir  Antimicorbials  :    Anti-infectives (From admission, onward)   Start     Dose/Rate Route Frequency Ordered Stop   05/17/20 1000  remdesivir 100 mg in sodium chloride 0.9 % 100 mL IVPB       "Followed by" Linked Group Details   100 mg 200 mL/hr over 30 Minutes Intravenous Daily 05/16/20 2104 05/21/20 0959   05/16/20 2115  remdesivir 200 mg in sodium  chloride 0.9% 250 mL IVPB       "Followed by" Linked Group Details   200 mg 580 mL/hr over 30 Minutes Intravenous Once 05/16/20 2104 05/17/20 0112      Inpatient Medications  Scheduled Meds: . apixaban  5 mg Oral BID  . vitamin C  500 mg Oral Daily  . baricitinib  4 mg Oral Daily  . bisoprolol  5 mg Oral Daily  . cholecalciferol  1,000 Units Oral Daily  . insulin aspart  0-5 Units Subcutaneous QHS  . insulin aspart  0-9 Units Subcutaneous TID WC  . Ipratropium-Albuterol  1 puff Inhalation Q6H  . methylPREDNISolone (SOLU-MEDROL) injection  1 mg/kg Intravenous Q12H   Followed by  . [START ON 05/19/2020] predniSONE  50 mg Oral Daily  . montelukast  10 mg Oral QHS  . multivitamin with minerals  1 tablet Oral Daily  . pantoprazole  40 mg Oral Daily  . rosuvastatin  5 mg Oral Daily  . zinc sulfate  220 mg Oral Daily   Continuous Infusions: . sodium chloride    . remdesivir 100 mg in NS 100 mL Stopped (05/17/20 0902)   PRN Meds:.albuterol, guaiFENesin-dextromethorphan, metoprolol tartrate   Time Spent in minutes  25  See all Orders from today for further details   Jeoffrey Massed M.D on 05/17/2020 at 10:18 AM  To page go to [email or web address] - use universal password  [facility] -  Office  [phone]    Objective:   Vitals:   05/17/20 0915 05/17/20 0930 05/17/20 0945 05/17/20 1015  BP: (!) 131/102 129/83 137/80 127/83  Pulse: (!) 127 (!) 118 (!) 120 72  Resp: (!) 23 20 (!) 21 (!) 25  Temp:      TempSrc:      SpO2: 95% 94% 94% 93%  Weight:      Height:  Wt Readings from Last 3 Encounters:  05/16/20 72.6 kg  01/23/20 82 kg  07/18/19 79.4 kg    No intake or output data in the 24 hours ending 05/17/20 1018   Physical Exam Gen Exam:Alert awake-not in any distress HEENT:atraumatic, normocephalic Chest: B/L clear to auscultation anteriorly CVS:S1S2 regular Abdomen:soft non tender, non distended Extremities:no edema Neurology: Non focal Skin: no  rash   Data Review:    CBC Recent Labs  Lab 05/16/20 1745 05/17/20 0459  WBC 4.6 3.4*  HGB 12.7 12.5  HCT 38.4 40.6  PLT 266 239  MCV 84.0 87.5  MCH 27.8 26.9  MCHC 33.1 30.8  RDW 13.8 13.9  LYMPHSABS 0.5* 0.3*  MONOABS 0.0* 0.1  EOSABS 0.0 0.0  BASOSABS 0.0 0.0    Chemistries  Recent Labs  Lab 05/16/20 1745 05/17/20 0459  NA 137 136  K 3.3* 4.4  CL 97* 97*  CO2 24 24  GLUCOSE 99 178*  BUN 9 12  CREATININE 0.56 0.51  CALCIUM 8.9 8.6*  MG  --  1.9  AST 26 39  ALT 17 19  ALKPHOS 54 49  BILITOT 1.0 1.1   ------------------------------------------------------------------------------------------------------------------ Recent Labs    05/16/20 1745  TRIG 185*    Lab Results  Component Value Date   HGBA1C 5.9 (H) 01/23/2020   ------------------------------------------------------------------------------------------------------------------ Recent Labs    05/17/20 0459  TSH 0.265*   ------------------------------------------------------------------------------------------------------------------ Recent Labs    05/16/20 1745 05/17/20 0459  FERRITIN 643* 599*    Coagulation profile No results for input(s): INR, PROTIME in the last 168 hours.  Recent Labs    05/16/20 1745 05/17/20 0459  DDIMER 0.90* 0.88*    Cardiac Enzymes No results for input(s): CKMB, TROPONINI, MYOGLOBIN in the last 168 hours.  Invalid input(s): CK ------------------------------------------------------------------------------------------------------------------    Component Value Date/Time   BNP 528.6 (H) 05/16/2020 1745    Micro Results No results found for this or any previous visit (from the past 240 hour(s)).  Radiology Reports DG Chest Port 1 View  Result Date: 05/16/2020 CLINICAL DATA:  Increased shortness of breath, COVID-19 diagnosed 5 days ago, tachycardia EXAM: PORTABLE CHEST 1 VIEW COMPARISON:  12/30/2014 FINDINGS: Single frontal view of the chest  demonstrates an unremarkable cardiac silhouette. There are diffuse interstitial and ground-glass opacities, with relative sparing of the right lung base, consistent with history of COVID 19 pneumonia. No effusion or pneumothorax. IMPRESSION: 1. Multifocal bilateral pneumonia consistent with COVID 19. Electronically Signed   By: Sharlet Salina M.D.   On: 05/16/2020 18:32

## 2020-05-17 NOTE — ED Notes (Signed)
Breakfast Ordered 

## 2020-05-17 NOTE — ED Notes (Signed)
Pt visitor dropped off 2 bags for pt, pt given 2 bags

## 2020-05-18 LAB — COMPREHENSIVE METABOLIC PANEL
ALT: 21 U/L (ref 0–44)
AST: 26 U/L (ref 15–41)
Albumin: 2.6 g/dL — ABNORMAL LOW (ref 3.5–5.0)
Alkaline Phosphatase: 46 U/L (ref 38–126)
Anion gap: 12 (ref 5–15)
BUN: 19 mg/dL (ref 8–23)
CO2: 29 mmol/L (ref 22–32)
Calcium: 8.7 mg/dL — ABNORMAL LOW (ref 8.9–10.3)
Chloride: 97 mmol/L — ABNORMAL LOW (ref 98–111)
Creatinine, Ser: 0.56 mg/dL (ref 0.44–1.00)
GFR, Estimated: >60 mL/min (ref >60)
Glucose, Bld: 163 mg/dL — ABNORMAL HIGH (ref 70–99)
Potassium: 3.6 mmol/L (ref 3.5–5.1)
Sodium: 138 mmol/L (ref 135–145)
Total Bilirubin: 0.5 mg/dL (ref 0.3–1.2)
Total Protein: 6.4 g/dL — ABNORMAL LOW (ref 6.5–8.1)

## 2020-05-18 LAB — CBC WITH DIFFERENTIAL/PLATELET
Abs Immature Granulocytes: 0.03 10*3/uL (ref 0.00–0.07)
Basophils Absolute: 0 10*3/uL (ref 0.0–0.1)
Basophils Relative: 0 %
Eosinophils Absolute: 0 10*3/uL (ref 0.0–0.5)
Eosinophils Relative: 0 %
HCT: 36.4 % (ref 36.0–46.0)
Hemoglobin: 12.1 g/dL (ref 12.0–15.0)
Immature Granulocytes: 1 %
Lymphocytes Relative: 12 %
Lymphs Abs: 0.4 10*3/uL — ABNORMAL LOW (ref 0.7–4.0)
MCH: 27.9 pg (ref 26.0–34.0)
MCHC: 33.2 g/dL (ref 30.0–36.0)
MCV: 84.1 fL (ref 80.0–100.0)
Monocytes Absolute: 0.2 10*3/uL (ref 0.1–1.0)
Monocytes Relative: 7 %
Neutro Abs: 2.8 10*3/uL (ref 1.7–7.7)
Neutrophils Relative %: 80 %
Platelets: 284 10*3/uL (ref 150–400)
RBC: 4.33 MIL/uL (ref 3.87–5.11)
RDW: 14 % (ref 11.5–15.5)
WBC: 3.5 10*3/uL — ABNORMAL LOW (ref 4.0–10.5)
nRBC: 0 % (ref 0.0–0.2)

## 2020-05-18 LAB — C-REACTIVE PROTEIN: CRP: 6.6 mg/dL — ABNORMAL HIGH (ref <1.0)

## 2020-05-18 LAB — T4, FREE: Free T4: 1.33 ng/dL — ABNORMAL HIGH (ref 0.61–1.12)

## 2020-05-18 LAB — BRAIN NATRIURETIC PEPTIDE: B Natriuretic Peptide: 288.3 pg/mL — ABNORMAL HIGH (ref 0.0–100.0)

## 2020-05-18 LAB — GLUCOSE, CAPILLARY
Glucose-Capillary: 143 mg/dL — ABNORMAL HIGH (ref 70–99)
Glucose-Capillary: 166 mg/dL — ABNORMAL HIGH (ref 70–99)
Glucose-Capillary: 189 mg/dL — ABNORMAL HIGH (ref 70–99)
Glucose-Capillary: 213 mg/dL — ABNORMAL HIGH (ref 70–99)
Glucose-Capillary: 226 mg/dL — ABNORMAL HIGH (ref 70–99)

## 2020-05-18 LAB — TSH: TSH: 0.271 u[IU]/mL — ABNORMAL LOW (ref 0.350–4.500)

## 2020-05-18 LAB — D-DIMER, QUANTITATIVE: D-Dimer, Quant: 0.55 ug/mL-FEU — ABNORMAL HIGH (ref 0.00–0.50)

## 2020-05-18 LAB — FERRITIN: Ferritin: 634 ng/mL — ABNORMAL HIGH (ref 11–307)

## 2020-05-18 LAB — MAGNESIUM: Magnesium: 1.9 mg/dL (ref 1.7–2.4)

## 2020-05-18 MED ORDER — ENSURE ENLIVE PO LIQD
237.0000 mL | Freq: Three times a day (TID) | ORAL | Status: DC
  Administered 2020-05-19 (×3): 237 mL via ORAL

## 2020-05-18 MED ORDER — METHYLPREDNISOLONE SODIUM SUCC 125 MG IJ SOLR
60.0000 mg | Freq: Two times a day (BID) | INTRAMUSCULAR | Status: DC
  Administered 2020-05-18 (×2): 60 mg via INTRAVENOUS
  Filled 2020-05-18 (×2): qty 2

## 2020-05-18 NOTE — Evaluation (Signed)
Occupational Therapy Evaluation Patient Details Name: Penny Barnett MRN: [id number] DOB: 1950-07-01 Today's Date: 05/18/2020    History of Present Illness Patient is a 69 y.o. female with PMHx of PAF on Eliquis, HTN, HLD, bronchial asthma-who was diagnosed with COVID-19 on 11/27-received monoclonal antibody infusion as an outpatient on 11/29-presented to the hospital with several days history of shortness of breath, she was found to have acute hypoxic respiratory failure due to COVID-19 pneumonia and admitted to the hospitalist service.   Clinical Impression   This 69 y/o female presents with the above. PTA pt living alone and performing ADL, iADL and mobility tasks independently. Today pt performing room level mobility without AD at [facility] assist level, requiring up to minA for LB ADL. Pt on 3L O2 throughout with lowest SpO2 noted 84% (hard to maintain good waveform with mobility), but returning to >/=91% with seated rest, fluctuating HR with max noted briefly up to 130bpm. Pt reports her sister lives in the same apartment building and can assist PRN at time of discharge. Pt to benefit from continued acute OT services to maximize her overall safety and independence with ADL and mobility. Do not anticipate she will require follow up OT services after discharge.     Follow Up Recommendations  Supervision - Intermittent;No OT follow up    Equipment Recommendations  Tub/shower seat           Precautions / Restrictions Precautions Precautions: Fall Restrictions Weight Bearing Restrictions: No      Mobility Bed Mobility               General bed mobility comments: OOB in recliner     Transfers Overall transfer level: Needs assistance Equipment used: None Transfers: Sit to/from Stand Sit to Stand: Min guard         General transfer comment: for balance, lines and safety, no assist required. multiple sit<>stand from recliner and from [facility]     Balance Overall balance  assessment: Mild deficits observed, not formally tested                                         ADL either performed or assessed with clinical judgement   ADL Overall ADL's : Needs assistance/impaired Eating/Feeding: Modified independent;Sitting   Grooming: Supervision/safety;Standing;Wash/dry face;Wash/dry hands;Set up   Upper Body Bathing: Supervision/ safety;Sitting   Lower Body Bathing: Minimal assistance;Sit to/from stand   Upper Body Dressing : Set up;Sitting   Lower Body Dressing: Minimal assistance;Sit to/from stand   Toilet Transfer: Min guard;Ambulation;BSC Toilet Transfer Details (indicate cue type and reason): mobilizing to [facility] in room  Toileting- Clothing Manipulation and Hygiene: Min guard;Sit to/from stand Toileting - Clothing Manipulation Details (indicate cue type and reason): pt performing pericare and clothing management (gown and underwear)     Functional mobility during ADLs: Min guard                           Pertinent Vitals/Pain Pain Assessment: No/denies pain     Hand Dominance     Extremity/Trunk Assessment Upper Extremity Assessment Upper Extremity Assessment: Overall WFL for tasks assessed   Lower Extremity Assessment Lower Extremity Assessment: Defer to PT evaluation   Cervical / Trunk Assessment Cervical / Trunk Assessment: Normal   Communication Communication Communication: No difficulties   Cognition Arousal/Alertness: Awake/alert Behavior During Therapy: WFL for tasks assessed/performed Overall  Cognitive Status: Within Functional Limits for tasks assessed                                     General Comments       Exercises     Shoulder Instructions      Home Living Family/patient expects to be discharged to:: Private residence Living Arrangements: Alone Available Help at Discharge: Family;Other (Comment) (sister lives in same apartment building ) Type of Home: Apartment Home  Access: Level entry     Home Layout: One level     Bathroom Shower/Tub: Producer, television/film/video: Standard     Home Equipment: Toilet riser          Prior Functioning/Environment Level of Independence: Independent        Comments: 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), walks 3-6 miles daily         OT Problem List: Decreased strength;Decreased range of motion;Decreased activity tolerance;Impaired balance (sitting and/or standing);Cardiopulmonary status limiting activity;Decreased knowledge of use of DME or AE      OT Treatment/Interventions: Self-care/ADL training;Therapeutic exercise;Energy conservation;DME and/or AE instruction;Therapeutic activities;Patient/family education;Balance training    OT Goals(Current goals can be found in the care plan section) Acute Rehab OT Goals Patient Stated Goal: home, back to her PLOF (walking, working) OT Goal Formulation: With patient Time For Goal Achievement: 06/01/20 Potential to Achieve Goals: Good  OT Frequency: Min 2X/week   Barriers to D/C:            Co-evaluation              AM-PAC OT "6 Clicks" Daily Activity     Outcome Measure Help from another person eating meals?: None Help from another person taking care of personal grooming?: A Little Help from another person toileting, which includes using toliet, bedpan, or urinal?: A Little Help from another person bathing (including washing, rinsing, drying)?: A Little Help from another person to put on and taking off regular upper body clothing?: A Little Help from another person to put on and taking off regular lower body clothing?: A Little 6 Click Score: 19   End of Session Equipment Utilized During Treatment: Oxygen Nurse Communication: Mobility status  Activity Tolerance: Patient tolerated treatment well Patient left: in chair;with call bell/phone within reach  OT Visit Diagnosis: Unsteadiness on feet (R26.81);Other (comment)  (decreased activity tolerance )                Time: 8527-7824 OT Time Calculation (min): 26 min Charges:  OT General Charges $OT Visit: 1 Visit OT Evaluation $OT Eval Moderate Complexity: 1 Mod OT Treatments $Self Care/Home Management : 8-22 mins  Marcy Siren, OT Acute Rehabilitation Services Pager [phone] Office [phone]  Orlando Penner 05/18/2020, 4:31 PM

## 2020-05-18 NOTE — Progress Notes (Signed)
PROGRESS NOTE                                                                                                                                                                                                             Patient Demographics:    Penny Barnett, is a 69 y.o. female, DOB - 1951/02/02, EXB:[id number]  Outpatient Primary MD for the patient is Panosh, Neta Mends, MD   Admit date - 05/16/2020   LOS - 2  Chief Complaint  Patient presents with  . Covid Shortness of Breath       Brief Narrative: Patient is a 69 y.o. female with PMHx of PAF on Eliquis, HTN, HLD, bronchial asthma-who was diagnosed with COVID-19 on 11/27-received monoclonal antibody infusion as an outpatient on 11/29-presented to the hospital with several days history of shortness of breath, she was found to have acute hypoxic respiratory failure due to COVID-19 pneumonia and admitted to the hospitalist service.  See below for further details.  COVID-19 vaccinated status: Unvaccinated  Significant Events: 11/27>> COVID-19 positive (results in care everywhere) 11/29>> MAB infusion x1 12/2>> Admit to [facility] for hypoxia due to COVID-19 infection  Significant studies: 12/2>>Chest x-ray: Multifocal pneumonia  COVID-19 medications: Steroids: 12/2>> Remdesivir: 12/2>> Baricitinib: 12/2>> Monoclonal antibody infusion: 11/29 x 1  Antibiotics: None  Microbiology data: 12/2 >>blood culture: Pending  Procedures: TTE -EF 60%.  No wall motion abnormality.  Consults: None  DVT prophylaxis: apixaban (ELIQUIS) tablet 5 mg     Subjective:   Patient in bed, appears comfortable, denies any headache, no fever, no chest pain or pressure, improved shortness of breath , no abdominal pain. No focal weakness.    Assessment  & Plan :   Acute Hypoxic Resp Failure due to Covid 19 Viral pneumonia: She unfortunately unvaccinated and has incurred severe  parenchymal lung injury due to COVID-19 pneumonia, she is currently being treated with combination of IV steroids, Remdesivir and Baricitinib.  Clinically seems to have stabilized we will continue to monitor closely.   O2 requirements:  SpO2: 92 % O2 Flow Rate (L/min): 3 L/min    Recent Labs  Lab 05/16/20 1745 05/16/20 2152 05/17/20 0459 05/18/20 0238  WBC 4.6  --  3.4* 3.5*  HGB 12.7  --  12.5 12.1  HCT 38.4  --  40.6 36.4  PLT 266  --  239 284  CRP 12.5*  --  10.9* 6.6*  BNP 528.6*  --   --  288.3*  DDIMER 0.90*  --  0.88* 0.55*  PROCALCITON <0.10  --   --   --   AST 26  --  39 26  ALT 17  --  19 21  ALKPHOS 54  --  49 46  BILITOT 1.0  --  1.1 0.5  ALBUMIN 3.0*  --  2.7* 2.6*  LATICACIDVEN 1.8 2.2*  --   --     Prone/Incentive Spirometry: encouraged patient to lie prone for 3-4 hours at a time for a total of 16 hours a day, and to encourage incentive spirometry use 3-4/hour.  A. fib with RVR: Rate better controlled-continue bisoprolol-remains on Eliquis.  Echo reviewed and appears to have preserved EF with no acute findings.  TSH was slightly low, will repeat TSH along with free T4 and T3.  HLD: Continue statin  Bronchial asthma: No exacerbation-continue bronchodilators  GERD: Continue PPI     Condition - Guarded  Family Communication  :  Sister Dionne Milo [phone]) updated over the phone-12/3  Code Status :  DNI  Diet :  Diet Order            Diet Heart Room service appropriate? Yes; Fluid consistency: Thin  Diet effective now                  Disposition Plan  :  Status is: Inpatient   Remains inpatient appropriate because:Inpatient level of care appropriate due to severity of illness   Dispo:  Patient From: Home  Planned Disposition: Home  Expected discharge date: 05/20/20  Medically stable for discharge: No    Barriers to discharge: Hypoxia requiring O2 supplementation/complete 5 days of IV Remdesivir  Antimicorbials  :     Anti-infectives (From admission, onward)   Start     Dose/Rate Route Frequency Ordered Stop   05/17/20 1000  remdesivir 100 mg in sodium chloride 0.9 % 100 mL IVPB       "Followed by" Linked Group Details   100 mg 200 mL/hr over 30 Minutes Intravenous Daily 05/16/20 2104 05/21/20 0959   05/16/20 2115  remdesivir 200 mg in sodium chloride 0.9% 250 mL IVPB       "Followed by" Linked Group Details   200 mg 580 mL/hr over 30 Minutes Intravenous Once 05/16/20 2104 05/17/20 0112      Inpatient Medications  Scheduled Meds: . apixaban  5 mg Oral BID  . vitamin C  500 mg Oral Daily  . baricitinib  4 mg Oral Daily  . benzonatate  200 mg Oral TID  . bisoprolol  5 mg Oral Daily  . cholecalciferol  1,000 Units Oral Daily  . insulin aspart  0-5 Units Subcutaneous QHS  . insulin aspart  0-9 Units Subcutaneous TID WC  . Ipratropium-Albuterol  1 puff Inhalation Q6H  . methylPREDNISolone (SOLU-MEDROL) injection  60 mg Intravenous BID  . montelukast  10 mg Oral QHS  . multivitamin with minerals  1 tablet Oral Daily  . pantoprazole  40 mg Oral Daily  . rosuvastatin  5 mg Oral Daily  . zinc sulfate  220 mg Oral Daily   Continuous Infusions: . remdesivir 100 mg in NS 100 mL 100 mg (05/18/20 0901)   PRN Meds:.acetaminophen, albuterol, guaiFENesin-dextromethorphan, metoprolol tartrate, ondansetron (ZOFRAN) IV   Time Spent in minutes  25  See all Orders from today for further details   David Stall.D  on 05/18/2020 at 11:36 AM  To page go to [email or web address] - use universal password  [facility] -  Office  [phone]    Objective:   Vitals:   05/17/20 2300 05/17/20 2352 05/18/20 0408 05/18/20 0721  BP: 126/82 129/85 121/81 123/82  Pulse: 93 (!) 103 93 94  Resp: (!) 23 20 (!) 22   Temp:  97.8 F (36.6 C) 97.6 F (36.4 C) (!) 97 F (36.1 C)  TempSrc:  Oral Oral Axillary  SpO2: 95% 95% 93% 92%  Weight:  75 kg    Height:  5\' 4"  (1.626 m)      Wt Readings from Last 3  Encounters:  05/17/20 75 kg  01/23/20 82 kg  07/18/19 79.4 kg    No intake or output data in the 24 hours ending 05/18/20 1136   Physical Exam  Awake Alert, No new F.N deficits, Normal affect [place].AT,PERRAL Supple Neck,No JVD, No cervical lymphadenopathy appriciated.  Symmetrical Chest wall movement, Good air movement bilaterally, CTAB iRRR,No Gallops, Rubs or new Murmurs, No Parasternal Heave +ve B.Sounds, Abd Soft, No tenderness, No organomegaly appriciated, No rebound - guarding or rigidity. No Cyanosis, Clubbing or edema, No new Rash or bruise    Data Review:    CBC Recent Labs  Lab 05/16/20 1745 05/17/20 0459 05/18/20 0238  WBC 4.6 3.4* 3.5*  HGB 12.7 12.5 12.1  HCT 38.4 40.6 36.4  PLT 266 239 284  MCV 84.0 87.5 84.1  MCH 27.8 26.9 27.9  MCHC 33.1 30.8 33.2  RDW 13.8 13.9 14.0  LYMPHSABS 0.5* 0.3* 0.4*  MONOABS 0.0* 0.1 0.2  EOSABS 0.0 0.0 0.0  BASOSABS 0.0 0.0 0.0    Chemistries  Recent Labs  Lab 05/16/20 1745 05/17/20 0459 05/18/20 0238  NA 137 136 138  K 3.3* 4.4 3.6  CL 97* 97* 97*  CO2 24 24 29   GLUCOSE 99 178* 163*  BUN 9 12 19   CREATININE 0.56 0.51 0.56  CALCIUM 8.9 8.6* 8.7*  MG  --  1.9 1.9  AST 26 39 26  ALT 17 19 21   ALKPHOS 54 49 46  BILITOT 1.0 1.1 0.5   ------------------------------------------------------------------------------------------------------------------ Recent Labs    05/16/20 1745  TRIG 185*    Lab Results  Component Value Date   HGBA1C 5.9 (H) 01/23/2020   ------------------------------------------------------------------------------------------------------------------ Recent Labs    05/17/20 0459  TSH 0.265*   ------------------------------------------------------------------------------------------------------------------ Recent Labs    05/17/20 0459 05/18/20 0238  FERRITIN 599* 634*    Coagulation profile No results for input(s): INR, PROTIME in the last 168 hours.  Recent Labs     05/17/20 0459 05/18/20 0238  DDIMER 0.88* 0.55*    Cardiac Enzymes No results for input(s): CKMB, TROPONINI, MYOGLOBIN in the last 168 hours.  Invalid input(s): CK ------------------------------------------------------------------------------------------------------------------    Component Value Date/Time   BNP 288.3 (H) 05/18/2020 [id number]    Micro Results Recent Results (from the past 240 hour(s))  Blood Culture (routine x 2)     Status: None (Preliminary result)   Collection Time: 05/16/20 10:16 PM   Specimen: BLOOD LEFT HAND  Result Value Ref Range Status   Specimen Description BLOOD LEFT HAND  Final   Special Requests AEROBIC BOTTLE ONLY Blood Culture adequate volume  Final   Culture   Final    NO GROWTH 1 DAY Performed at [facility], [address] [place], [place] [id number]    Report Status PENDING  Incomplete    Radiology Reports [facility]  [facility]  Result Date: 05/16/2020 CLINICAL DATA:  Increased shortness of breath, COVID-19 diagnosed 5 days ago, tachycardia EXAM: PORTABLE CHEST 1 VIEW COMPARISON:  12/30/2014 FINDINGS: Single frontal view of the chest demonstrates an unremarkable cardiac silhouette. There are diffuse interstitial and ground-glass opacities, with relative sparing of the right lung base, consistent with history of COVID 19 pneumonia. No effusion or pneumothorax. IMPRESSION: 1. Multifocal bilateral pneumonia consistent with COVID 19. Electronically Signed   By: Sharlet Salina M.D.   On: 05/16/2020 18:32   ECHOCARDIOGRAM LIMITED  Result Date: 05/17/2020    ECHOCARDIOGRAM LIMITED REPORT   Patient Name:   KATELAN HIRT Date of Exam: 05/17/2020 Medical Rec #:  [id number]         Height:       64.0 in Accession #:    [id number]        Weight:       160.0 lb Date of Birth:  [birth year]         BSA:          1.779 m Patient Age:    69 years          BP:           126/82 mmHg Patient Gender: F                 HR:           95 bpm. Exam Location:   Inpatient Procedure: Limited Echo, Limited Color Doppler and Cardiac Doppler Indications:    I48.0 Paroxysmal atrial fibrillation  History:        Patient has prior history of Echocardiogram examinations, most                 recent 01/03/2015. Arrythmias:Atrial Flutter; Risk                 Factors:Hypertension and Dyslipidemia. COVID-19 Postive. GERD.  Sonographer:    Elmarie Shiley Dance Referring Phys: [id number] Oliver Pila HALL  Sonographer Comments: No subcostal window. IMPRESSIONS  1. Left ventricular ejection fraction, by estimation, is 60 to 65%. The left ventricle has normal function. Left ventricular diastolic function could not be evaluated.  2. Right ventricular systolic function is normal. The right ventricular size is normal. There is normal pulmonary artery systolic pressure.  3. The mitral valve is grossly normal. Mild mitral valve regurgitation.  4. The aortic valve is normal in structure. Aortic valve regurgitation is not visualized. No aortic stenosis is present. FINDINGS  Left Ventricle: Left ventricular ejection fraction, by estimation, is 60 to 65%. The left ventricle has normal function. Left ventricular diastolic function could not be evaluated. Left ventricular diastolic function could not be evaluated due to atrial  fibrillation. Right Ventricle: The right ventricular size is normal. No increase in right ventricular wall thickness. Right ventricular systolic function is normal. There is normal pulmonary artery systolic pressure. The tricuspid regurgitant velocity is 2.55 m/s, and  with an assumed right atrial pressure of 8 mmHg, the estimated right ventricular systolic pressure is 34.0 mmHg. Mitral Valve: The mitral valve is grossly normal. Mild mitral valve regurgitation. Tricuspid Valve: The tricuspid valve is normal in structure. Tricuspid valve regurgitation is trivial. Aortic Valve: The aortic valve is normal in structure. Aortic valve regurgitation is not visualized. No aortic stenosis is  present. Pulmonic Valve: The pulmonic valve was normal in structure. Pulmonic valve regurgitation is not visualized. Aorta: The aortic root and ascending aorta are structurally normal, with no evidence of dilitation. LEFT  VENTRICLE PLAX 2D LVIDd:         3.77 cm LVIDs:         2.90 cm LV PW:         1.05 cm LV IVS:        1.30 cm LVOT diam:     1.90 cm LV SV:         47 LV SV Index:   27 LVOT Area:     2.84 cm  LEFT ATRIUM         Index LA diam:    4.60 cm 2.59 cm/m  AORTIC VALVE LVOT Vmax:   94.40 cm/s LVOT Vmean:  61.100 cm/s LVOT VTI:    0.168 m  AORTA Ao Root diam: 3.30 cm Ao Asc diam:  3.20 cm MITRAL VALVE                TRICUSPID VALVE MV Area (PHT): 3.96 cm     TR Peak grad:   26.0 mmHg MV Decel Time: 192 msec     TR Vmax:        255.00 cm/s MV E velocity: 109.00 cm/s                             SHUNTS                             Systemic VTI:  0.17 m                             Systemic Diam: 1.90 cm Kristeen Miss MD Electronically signed by Kristeen Miss MD Signature Date/Time: 05/17/2020/2:40:59 PM    Final

## 2020-05-18 NOTE — Progress Notes (Signed)
Initial Nutrition Assessment  DOCUMENTATION CODES:   Not applicable  INTERVENTION:  Ensure Enlive po TID, each supplement provides 350 kcal and 20 grams of protein (strawberry or vanilla)  Continue MVI with minerals daily  Encouraged po intake of meals and supplements  Education provided on importance of adequate nutrition    NUTRITION DIAGNOSIS:   Inadequate oral intake related to decreased appetite, acute illness (pneumonia secondary to COVID-19 virus infection) as evidenced by per patient/family report (meal intake 50% insufficient to meet estimated needs).    GOAL:   Patient will meet greater than or equal to 90% of their needs    MONITOR:   PO intake, Supplement acceptance, Labs, I & O's, Weight trends  REASON FOR ASSESSMENT:   Malnutrition Screening Tool    ASSESSMENT:  RD working remotely.   69 year old female admitted with COVID-19 pneumonia. Past medical history significant of paroxsymal atrial fibrillation, HTN, HLD, asthma, GERD presents with gradually worsening shortness of breath and generalized weakness s/p receiving monoclonal antibodies.  Patient is unvaccinated and diagnosed with COVID-19 on 11/27 at urgent care and received monoclonal antibodies on 11/29 at [facility] infusion center.  Patient with fair intake, eating 50% x 1 documented meal. RD spoke with pt via phone, reports breathing is better today and appetite is improving. She recalls good appetite at baseline, endorses 1 week of poor intake secondary to lost of taste/smell which has resolved s/p monoclonal infusion. RD educated on the importance of nutrition, discussed increase calorie and protein needs related to catabolic nature of COVID-19 virus and encouraged po intake. Patient agreeable to drinking vanilla or strawberry flavored Ensure to aid with meeting needs.   Per chart, weights have trended down ~15 lbs (8.5%) in the last 4 months; significant. Given trends, advanced age, as well as recent  history of decreased po intake secondary to COVID infection, highly suspect degree of acute malnutrition, however unable to identify at this time. Will plan to complete exam at follow-up.  Medications reviewed and include: Vitamin C, Tessalon, Zebeta, D3, SSI, Methylprednisolone, MVI, Zinc sulfate, Protonix, Remdesivir  Labs: CBGs [phone], BNP 288.3 (H), WBC 3.5 (L), K 3.6 (WNL), Mg 1.9 (WNL)   NUTRITION - FOCUSED PHYSICAL EXAM: Unable to complete at this time, RD working remotely.  Diet Order:   Diet Order            Diet Heart Room service appropriate? Yes; Fluid consistency: Thin  Diet effective now                 EDUCATION NEEDS:   Education needs have been addressed  Skin:     Last BM:  pta  Height:   Ht Readings from Last 1 Encounters:  05/17/20 5\' 4"  (1.626 m)    Weight:   Wt Readings from Last 1 Encounters:  05/17/20 75 kg    BMI:  Body mass index is 28.38 kg/m.  Estimated Nutritional Needs:   Kcal:  2100-2400  Protein:  113-120  Fluid:  > 1.8 L   14/03/21, RD, LDN Clinical Nutrition After Hours/Weekend Pager # in Amion

## 2020-05-18 NOTE — Progress Notes (Signed)
Pt arrived to the unit via stretcher and ambulated to the bed without difficulty. Vitals WNL, pt in nAD, oriented to the room and bathed by NTs. Skin check completed with Duwayne Heck RN. CBG was 143, Belongings and meds verified.

## 2020-05-18 NOTE — Discharge Instructions (Addendum)
Follow with Primary MD Panosh, Neta Mends, MD in 7 days   Get CBC, CMP, TSH, Free T4, T3, 2 view Chest X ray -  checked next visit within 1 week by Primary MD    Activity: As tolerated with Full fall precautions use walker/cane & assistance as needed  Disposition Home   Diet: Heart Healthy    Special Instructions: If you have smoked or chewed Tobacco  in the last 2 yrs please stop smoking, stop any regular Alcohol  and or any Recreational drug use.  On your next visit with your primary care physician please Get Medicines reviewed and adjusted.  Please request your Prim.MD to go over all Hospital Tests and Procedure/Radiological results at the follow up, please get all Hospital records sent to your Prim MD by signing hospital release before you go home.  If you experience worsening of your admission symptoms, develop shortness of breath, life threatening emergency, suicidal or homicidal thoughts you must seek medical attention immediately by calling 911 or calling your MD immediately  if symptoms less severe.  You Must read complete instructions/literature along with all the possible adverse reactions/side effects for all the Medicines you take and that have been prescribed to you. Take any new Medicines after you have completely understood and accpet all the possible adverse reactions/side effects.          Person Under Monitoring Name: Penny Barnett  Location: [address] [place] [address]   Infection Prevention Recommendations for Individuals Confirmed to have, or Being Evaluated for, 2019 Novel Coronavirus (COVID-19) Infection Who Receive Care at Home  Individuals who are confirmed to have, or are being evaluated for, COVID-19 should follow the prevention steps below until a healthcare provider or local or state health department says they can return to normal activities.  Stay home except to get medical care You should restrict activities outside  your home, except for getting medical care. Do not go to work, school, or public areas, and do not use public transportation or taxis.  Call ahead before visiting your doctor Before your medical appointment, call the healthcare provider and tell them that you have, or are being evaluated for, COVID-19 infection. This will help the healthcare provider's office take steps to keep other people from getting infected. Ask your healthcare provider to call the local or state health department.  Monitor your symptoms Seek prompt medical attention if your illness is worsening (e.g., difficulty breathing). Before going to your medical appointment, call the healthcare provider and tell them that you have, or are being evaluated for, COVID-19 infection. Ask your healthcare provider to call the local or state health department.  Wear a facemask You should wear a facemask that covers your nose and mouth when you are in the same room with other people and when you visit a healthcare provider. People who live with or visit you should also wear a facemask while they are in the same room with you.  Separate yourself from other people in your home As much as possible, you should stay in a different room from other people in your home. Also, you should use a separate bathroom, if available.  Avoid sharing household items You should not share dishes, drinking glasses, cups, eating utensils, towels, bedding, or other items with other people in your home. After using these items, you should wash them thoroughly with soap and water.  Cover your coughs and sneezes Cover your mouth and nose with a tissue  when you cough or sneeze, or you can cough or sneeze into your sleeve. Throw used tissues in a lined trash can, and immediately wash your hands with soap and water for at least 20 seconds or use an alcohol-based hand rub.  Wash your [facility] your hands often and thoroughly with soap and water for at least 20  seconds. You can use an alcohol-based hand sanitizer if soap and water are not available and if your hands are not visibly dirty. Avoid touching your eyes, nose, and mouth with unwashed hands.   Prevention Steps for Caregivers and Household Members of Individuals Confirmed to have, or Being Evaluated for, COVID-19 Infection Being Cared for in the Home  If you live with, or provide care at home for, a person confirmed to have, or being evaluated for, COVID-19 infection please follow these guidelines to prevent infection:  Follow healthcare provider's instructions Make sure that you understand and can help the patient follow any healthcare provider instructions for all care.  Provide for the patient's basic needs You should help the patient with basic needs in the home and provide support for getting groceries, prescriptions, and other personal needs.  Monitor the patient's symptoms If they are getting sicker, call his or her medical provider and tell them that the patient has, or is being evaluated for, COVID-19 infection. This will help the healthcare provider's office take steps to keep other people from getting infected. Ask the healthcare provider to call the local or state health department.  Limit the number of people who have contact with the patient  If possible, have only one caregiver for the patient.  Other household members should stay in another home or place of residence. If this is not possible, they should stay  in another room, or be separated from the patient as much as possible. Use a separate bathroom, if available.  Restrict visitors who do not have an essential need to be in the home.  Keep older adults, very young children, and other sick people away from the patient Keep older adults, very young children, and those who have compromised immune systems or chronic health conditions away from the patient. This includes people with chronic heart, lung, or kidney  conditions, diabetes, and cancer.  Ensure good ventilation Make sure that shared spaces in the home have good air flow, such as from an air conditioner or an opened window, weather permitting.  Wash your hands often  Wash your hands often and thoroughly with soap and water for at least 20 seconds. You can use an alcohol based hand sanitizer if soap and water are not available and if your hands are not visibly dirty.  Avoid touching your eyes, nose, and mouth with unwashed hands.  Use disposable paper towels to dry your hands. If not available, use dedicated cloth towels and replace them when they become wet.  Wear a facemask and gloves  Wear a disposable facemask at all times in the room and gloves when you touch or have contact with the patient's blood, body fluids, and/or secretions or excretions, such as sweat, saliva, sputum, nasal mucus, vomit, urine, or feces.  Ensure the mask fits over your nose and mouth tightly, and do not touch it during use.  Throw out disposable facemasks and gloves after using them. Do not reuse.  Wash your hands immediately after removing your facemask and gloves.  If your personal clothing becomes contaminated, carefully remove clothing and launder. Wash your hands after handling contaminated clothing.  Place all used disposable facemasks, gloves, and other waste in a lined container before disposing them with other household waste.  Remove gloves and wash your hands immediately after handling these items.  Do not share dishes, glasses, or other household items with the patient  Avoid sharing household items. You should not share dishes, drinking glasses, cups, eating utensils, towels, bedding, or other items with a patient who is confirmed to have, or being evaluated for, COVID-19 infection.  After the person uses these items, you should wash them thoroughly with soap and water.  Wash laundry thoroughly  Immediately remove and wash clothes or  bedding that have blood, body fluids, and/or secretions or excretions, such as sweat, saliva, sputum, nasal mucus, vomit, urine, or feces, on them.  Wear gloves when handling laundry from the patient.  Read and follow directions on labels of laundry or clothing items and detergent. In general, wash and dry with the warmest temperatures recommended on the label.  Clean all areas the individual has used often  Clean all touchable surfaces, such as counters, tabletops, doorknobs, bathroom fixtures, toilets, phones, keyboards, tablets, and bedside tables, every day. Also, clean any surfaces that may have blood, body fluids, and/or secretions or excretions on them.  Wear gloves when cleaning surfaces the patient has come in contact with.  Use a diluted bleach solution (e.g., dilute bleach with 1 part bleach and 10 parts water) or a household disinfectant with a label that says EPA-registered for coronaviruses. To make a bleach solution at home, add 1 tablespoon of bleach to 1 quart (4 cups) of water. For a larger supply, add  cup of bleach to 1 gallon (16 cups) of water.  Read labels of cleaning products and follow recommendations provided on product labels. Labels contain instructions for safe and effective use of the cleaning product including precautions you should take when applying the product, such as wearing gloves or eye protection and making sure you have good ventilation during use of the product.  Remove gloves and wash hands immediately after cleaning.  Monitor yourself for signs and symptoms of illness Caregivers and household members are considered close contacts, should monitor their health, and will be asked to limit movement outside of the home to the extent possible. Follow the monitoring steps for close contacts listed on the symptom monitoring form.   ? If you have additional questions, contact your local health department or call the epidemiologist on call at  [phone] (available 24/7). ? This guidance is subject to change. For the most up-to-date guidance from [facility], please refer to their website: [employer]Met[employer].hu  Information on my medicine - ELIQUIS (apixaban)  Why was Eliquis prescribed for you? Eliquis was prescribed for you to reduce the risk of a blood clot forming that can cause a stroke if you have a medical condition called atrial fibrillation (a type of irregular heartbeat).  What do You need to know about Eliquis ? Take your Eliquis TWICE DAILY - one tablet in the morning and one tablet in the evening with or without food. If you have difficulty swallowing the tablet whole please discuss with your pharmacist how to take the medication safely.  Take Eliquis exactly as prescribed by your doctor and DO NOT stop taking Eliquis without talking to the doctor who prescribed the medication.  Stopping may increase your risk of developing a stroke.  Refill your prescription before you run out.  After discharge, you should have regular check-up appointments with your healthcare provider that is prescribing  your Eliquis.  In the future your dose may need to be changed if your kidney function or weight changes by a significant amount or as you get older.  What do you do if you miss a dose? If you miss a dose, take it as soon as you remember on the same day and resume taking twice daily.  Do not take more than one dose of ELIQUIS at the same time to make up a missed dose.  Important Safety Information A possible side effect of Eliquis is bleeding. You should call your healthcare provider right away if you experience any of the following: ? Bleeding from an injury or your nose that does not stop. ? Unusual colored urine (red or dark brown) or unusual colored stools (red or black). ? Unusual bruising for unknown reasons. ? A serious fall or if you hit your head (even if there is no  bleeding).  Some medicines may interact with Eliquis and might increase your risk of bleeding or clotting while on Eliquis. To help avoid this, consult your healthcare provider or pharmacist prior to using any new prescription or non-prescription medications, including herbals, vitamins, non-steroidal anti-inflammatory drugs (NSAIDs) and supplements.  This website has more information on Eliquis (apixaban): http://[email or web address].com/eliquis/home

## 2020-05-19 LAB — T3: T3, Total: 93 ng/dL (ref 71–180)

## 2020-05-19 LAB — C-REACTIVE PROTEIN: CRP: 2.8 mg/dL — ABNORMAL HIGH (ref <1.0)

## 2020-05-19 LAB — D-DIMER, QUANTITATIVE: D-Dimer, Quant: 0.46 ug/mL-FEU (ref 0.00–0.50)

## 2020-05-19 LAB — GLUCOSE, CAPILLARY
Glucose-Capillary: 166 mg/dL — ABNORMAL HIGH (ref 70–99)
Glucose-Capillary: 212 mg/dL — ABNORMAL HIGH (ref 70–99)
Glucose-Capillary: 159 mg/dL — ABNORMAL HIGH (ref 70–99)

## 2020-05-19 MED ORDER — IPRATROPIUM-ALBUTEROL 20-100 MCG/ACT IN AERS
1.0000 | INHALATION_SPRAY | Freq: Three times a day (TID) | RESPIRATORY_TRACT | Status: DC
  Administered 2020-05-19 – 2020-05-20 (×4): 1 via RESPIRATORY_TRACT

## 2020-05-19 MED ORDER — METHIMAZOLE 5 MG PO TABS
5.0000 mg | ORAL_TABLET | Freq: Two times a day (BID) | ORAL | Status: DC
  Administered 2020-05-19 – 2020-05-20 (×3): 5 mg via ORAL
  Filled 2020-05-19 (×3): qty 1

## 2020-05-19 MED ORDER — METHYLPREDNISOLONE SODIUM SUCC 40 MG IJ SOLR
40.0000 mg | Freq: Every day | INTRAMUSCULAR | Status: DC
  Administered 2020-05-19 – 2020-05-20 (×2): 40 mg via INTRAVENOUS
  Filled 2020-05-19 (×2): qty 1

## 2020-05-19 NOTE — Progress Notes (Signed)
PROGRESS NOTE                                                                                                                                                                                                             Patient Demographics:    Penny Barnett, is a 69 y.o. female, DOB - 02/06/1951, ZOX:[id number]  Outpatient Primary MD for the patient is Panosh, Neta MendsWanda K, MD   Admit date - 05/16/2020   LOS - 3  Chief Complaint  Patient presents with  . Covid Shortness of Breath       Brief Narrative: Patient is a 69 y.o. female with PMHx of PAF on Eliquis, HTN, HLD, bronchial asthma-who was diagnosed with COVID-19 on 11/27-received monoclonal antibody infusion as an outpatient on 11/29-presented to the hospital with several days history of shortness of breath, she was found to have acute hypoxic respiratory failure due to COVID-19 pneumonia and admitted to the hospitalist service.  See below for further details.  COVID-19 vaccinated status: Unvaccinated  Significant Events: 11/27>> COVID-19 positive (results in care everywhere) 11/29>> MAB infusion x1 12/2>> [facility] due [facility] COVID-19 infection  Significant studies: 12/2>>Chest x-ray: Multifocal pneumonia  COVID-19 medications: Steroids: 12/2>> Remdesivir: 12/2>> Baricitinib: 12/2>> Monoclonal antibody infusion: 11/29 x 1  Antibiotics: None  Microbiology data: 12/2 >>blood culture: Pending  Procedures: TTE -EF 60%.  No wall motion abnormality.  Consults: None  DVT prophylaxis: apixaban (ELIQUIS) tablet 5 mg     Subjective:   Patient in bed, appears comfortable, denies any headache, no fever, no chest pain or pressure, no shortness of breath , no abdominal pain. No focal weakness.   Assessment  & Plan :   Acute Hypoxic Resp Failure due to Covid 19 Viral pneumonia: She unfortunately unvaccinated and has incurred severe parenchymal lung  injury due to COVID-19 pneumonia, she is currently being treated with combination of IV steroids, Remdesivir and Baricitinib.  Clinically seems to have stabilized we will continue to monitor closely.   O2 requirements:  SpO2: 96 % O2 Flow Rate (L/min): 3 L/min    Recent Labs  Lab 05/16/20 1745 05/16/20 2152 05/17/20 0459 05/18/20 0238 05/19/20 0510  WBC 4.6  --  3.4* 3.5*  --   HGB 12.7  --  12.5 12.1  --   HCT 38.4  --  40.6  36.4  --   PLT 266  --  239 284  --   CRP 12.5*  --  10.9* 6.6* 2.8*  BNP 528.6*  --   --  288.3*  --   DDIMER 0.90*  --  0.88* 0.55* 0.46  PROCALCITON <0.10  --   --   --   --   AST 26  --  39 26  --   ALT 17  --  19 21  --   ALKPHOS 54  --  49 46  --   BILITOT 1.0  --  1.1 0.5  --   ALBUMIN 3.0*  --  2.7* 2.6*  --   LATICACIDVEN 1.8 2.2*  --   --   --     Prone/Incentive Spirometry: encouraged patient to lie prone for 3-4 hours at a time for a total of 16 hours a day, and to encourage incentive spirometry use 3-4/hour.  A. fib with RVR: Rate better controlled-continue bisoprolol-remains on Eliquis.  Echo reviewed and appears to have preserved EF with no acute findings.  TSH low.  See below.  HLD: Continue statin  Bronchial asthma: No exacerbation-continue bronchodilators  GERD: Continue PPI  Mild hyper thyroidism.  TSH suppressed, free T4 high.  Placed on methimazole.  Outpatient follow-up with PCP and endocrine.    Condition - Guarded  Family Communication  :  Sister Dionne Milo [phone]) updated over the phone-12/3  Code Status :  DNI  Diet :   Diet Order            Diet Heart Room service appropriate? Yes; Fluid consistency: Thin  Diet effective now                  Disposition Plan  :  Status is: Inpatient   Remains inpatient appropriate because:Inpatient level of care appropriate due to severity of illness  Dispo:  Patient From: Home  Planned Disposition: Home  Expected discharge date: 05/20/20  Medically stable for  discharge: No    Barriers to discharge: Hypoxia requiring O2 supplementation/complete 5 days of IV Remdesivir  Antimicorbials  :    Anti-infectives (From admission, onward)   Start     Dose/Rate Route Frequency Ordered Stop   05/17/20 1000  remdesivir 100 mg in sodium chloride 0.9 % 100 mL IVPB       "Followed by" Linked Group Details   100 mg 200 mL/hr over 30 Minutes Intravenous Daily 05/16/20 2104 05/21/20 0959   05/16/20 2115  remdesivir 200 mg in sodium chloride 0.9% 250 mL IVPB       "Followed by" Linked Group Details   200 mg 580 mL/hr over 30 Minutes Intravenous Once 05/16/20 2104 05/17/20 0112      Inpatient Medications  Scheduled Meds: . apixaban  5 mg Oral BID  . vitamin C  500 mg Oral Daily  . baricitinib  4 mg Oral Daily  . benzonatate  200 mg Oral TID  . bisoprolol  5 mg Oral Daily  . cholecalciferol  1,000 Units Oral Daily  . feeding supplement  237 mL Oral TID BM  . insulin aspart  0-5 Units Subcutaneous QHS  . insulin aspart  0-9 Units Subcutaneous TID WC  . Ipratropium-Albuterol  1 puff Inhalation TID  . methimazole  5 mg Oral BID  . methylPREDNISolone (SOLU-MEDROL) injection  60 mg Intravenous BID  . montelukast  10 mg Oral QHS  . multivitamin with minerals  1 tablet Oral Daily  . pantoprazole  40  mg Oral Daily  . rosuvastatin  5 mg Oral Daily  . zinc sulfate  220 mg Oral Daily   Continuous Infusions: . remdesivir 100 mg in NS 100 mL 100 mg (05/18/20 0901)   PRN Meds:.acetaminophen, albuterol, guaiFENesin-dextromethorphan, metoprolol tartrate, ondansetron (ZOFRAN) IV   Time Spent in minutes  25  See all Orders from today for further details   Susa Raring M.D on 05/19/2020 at 8:28 AM  To page go to [email or web address] - use universal password  [facility] -  Office  [phone]    Objective:   Vitals:   05/18/20 1652 05/18/20 2049 05/19/20 0508 05/19/20 0808  BP: 105/64 109/78 124/87 134/77  Pulse: 99 97 96 84  Resp: (!) 22 (!) 23  20 (!) 22  Temp: 99.1 F (37.3 C) 97.6 F (36.4 C) 97.6 F (36.4 C) 97.8 F (36.6 C)  TempSrc: Axillary Oral Oral Oral  SpO2: 94% 93% 96%   Weight:      Height:        Wt Readings from Last 3 Encounters:  05/17/20 75 kg  01/23/20 82 kg  07/18/19 79.4 kg     Intake/Output Summary (Last 24 hours) at 05/19/2020 0828 Last data filed at 05/18/2020 1700 Gross per 24 hour  Intake 795.83 ml  Output --  Net 795.83 ml     Physical Exam  Awake Alert, No new F.N deficits, Normal affect Happy.AT,PERRAL Supple Neck,No JVD, No cervical lymphadenopathy appriciated.  Symmetrical Chest wall movement, Good air movement bilaterally, CTAB RRR,No Gallops, Rubs or new Murmurs, No Parasternal Heave +ve B.Sounds, Abd Soft, No tenderness, No organomegaly appriciated, No rebound - guarding or rigidity. No Cyanosis, Clubbing or edema, No new Rash or bruise     Data Review:    CBC Recent Labs  Lab 05/16/20 1745 05/17/20 0459 05/18/20 0238  WBC 4.6 3.4* 3.5*  HGB 12.7 12.5 12.1  HCT 38.4 40.6 36.4  PLT 266 239 284  MCV 84.0 87.5 84.1  MCH 27.8 26.9 27.9  MCHC 33.1 30.8 33.2  RDW 13.8 13.9 14.0  LYMPHSABS 0.5* 0.3* 0.4*  MONOABS 0.0* 0.1 0.2  EOSABS 0.0 0.0 0.0  BASOSABS 0.0 0.0 0.0    Chemistries  Recent Labs  Lab 05/16/20 1745 05/17/20 0459 05/18/20 0238  NA 137 136 138  K 3.3* 4.4 3.6  CL 97* 97* 97*  CO2 GLUCOSE 99 178* 163*  BUN CREATININE 0.56 0.51 0.56  CALCIUM 8.9 8.6* 8.7*  MG  --  1.9 1.9  AST 26 39 26  ALT ALKPHOS 54 49 46  BILITOT 1.0 1.1 0.5   ------------------------------------------------------------------------------------------------------------------ Recent Labs    05/16/20 1745  TRIG 185*    Lab Results  Component Value Date   HGBA1C 5.9 (H) 01/23/2020   ------------------------------------------------------------------------------------------------------------------ Recent Labs    05/18/20 1228  TSH 0.271*    ------------------------------------------------------------------------------------------------------------------ Recent Labs    05/17/20 0459 05/18/20 0238  FERRITIN 599* 634*    Coagulation profile No results for input(s): INR, PROTIME in the last 168 hours.  Recent Labs    05/18/20 0238 05/19/20 0510  DDIMER 0.55* 0.46    Cardiac Enzymes No results for input(s): CKMB, TROPONINI, MYOGLOBIN in the last 168 hours.  Invalid input(s): CK ------------------------------------------------------------------------------------------------------------------    Component Value Date/Time   BNP 288.3 (H) 05/18/2020 0981    Micro Results Recent Results (from the past 240 hour(s))  Blood Culture (routine x 2)  Status: None (Preliminary result)   Collection Time: 05/16/20 10:16 PM   Specimen: BLOOD LEFT HAND  Result Value Ref Range Status   Specimen Description BLOOD LEFT HAND  Final   Special Requests AEROBIC BOTTLE ONLY Blood Culture adequate volume  Final   Culture   Final    NO GROWTH 1 DAY Performed at [facility], [address] [place], [place] [address]    Report Status PENDING  Incomplete    Radiology Reports DG Chest Port 1 View  Result Date: 05/16/2020 CLINICAL DATA:  Increased shortness of breath, COVID-19 diagnosed 5 days ago, tachycardia EXAM: PORTABLE CHEST 1 VIEW COMPARISON:  12/30/2014 FINDINGS: Single frontal view of the chest demonstrates an unremarkable cardiac silhouette. There are diffuse interstitial and ground-glass opacities, with relative sparing of the right lung base, consistent with history of COVID 19 pneumonia. No effusion or pneumothorax. IMPRESSION: 1. Multifocal bilateral pneumonia consistent with COVID 19. Electronically Signed   By: Sharlet Salina M.D.   On: 05/16/2020 18:32   ECHOCARDIOGRAM LIMITED  Result Date: 05/17/2020    ECHOCARDIOGRAM LIMITED REPORT   Patient Name:   JESSAMINE BARCIA Date of Exam: 05/17/2020 Medical Rec #:   [id number]         Height:       64.0 in Accession #:    [id number]        Weight:       160.0 lb Date of Birth:  28-Mar-1951         BSA:          1.779 m Patient Age:    69 years          BP:           126/82 mmHg Patient Gender: F                 HR:           95 bpm. Exam Location:  Inpatient Procedure: Limited Echo, Limited Color Doppler and Cardiac Doppler Indications:    I48.0 Paroxysmal atrial fibrillation  History:        Patient has prior history of Echocardiogram examinations, most                 recent 01/03/2015. Arrythmias:Atrial Flutter; Risk                 Factors:Hypertension and Dyslipidemia. COVID-19 Postive. GERD.  Sonographer:    Elmarie Shiley Dance Referring Phys: [id number] Oliver Pila HALL  Sonographer Comments: No subcostal window. IMPRESSIONS  1. Left ventricular ejection fraction, by estimation, is 60 to 65%. The left ventricle has normal function. Left ventricular diastolic function could not be evaluated.  2. Right ventricular systolic function is normal. The right ventricular size is normal. There is normal pulmonary artery systolic pressure.  3. The mitral valve is grossly normal. Mild mitral valve regurgitation.  4. The aortic valve is normal in structure. Aortic valve regurgitation is not visualized. No aortic stenosis is present. FINDINGS  Left Ventricle: Left ventricular ejection fraction, by estimation, is 60 to 65%. The left ventricle has normal function. Left ventricular diastolic function could not be evaluated. Left ventricular diastolic function could not be evaluated due to atrial  fibrillation. Right Ventricle: The right ventricular size is normal. No increase in right ventricular wall thickness. Right ventricular systolic function is normal. There is normal pulmonary artery systolic pressure. The tricuspid regurgitant velocity is 2.55 m/s, and  with an assumed right atrial pressure of 8 mmHg,  the estimated right ventricular systolic pressure is 34.0 mmHg. Mitral Valve: The mitral valve  is grossly normal. Mild mitral valve regurgitation. Tricuspid Valve: The tricuspid valve is normal in structure. Tricuspid valve regurgitation is trivial. Aortic Valve: The aortic valve is normal in structure. Aortic valve regurgitation is not visualized. No aortic stenosis is present. Pulmonic Valve: The pulmonic valve was normal in structure. Pulmonic valve regurgitation is not visualized. Aorta: The aortic root and ascending aorta are structurally normal, with no evidence of dilitation. LEFT VENTRICLE PLAX 2D LVIDd:         3.77 cm LVIDs:         2.90 cm LV PW:         1.05 cm LV IVS:        1.30 cm LVOT diam:     1.90 cm LV SV:         47 LV SV Index:   27 LVOT Area:     2.84 cm  LEFT ATRIUM         Index LA diam:    4.60 cm 2.59 cm/m  AORTIC VALVE LVOT Vmax:   94.40 cm/s LVOT Vmean:  61.100 cm/s LVOT VTI:    0.168 m  AORTA Ao Root diam: 3.30 cm Ao Asc diam:  3.20 cm MITRAL VALVE                TRICUSPID VALVE MV Area (PHT): 3.96 cm     TR Peak grad:   26.0 mmHg MV Decel Time: 192 msec     TR Vmax:        255.00 cm/s MV E velocity: 109.00 cm/s                             SHUNTS                             Systemic VTI:  0.17 m                             Systemic Diam: 1.90 cm Kristeen Miss MD Electronically signed by Kristeen Miss MD Signature Date/Time: 05/17/2020/2:40:59 PM    Final

## 2020-05-19 NOTE — Evaluation (Signed)
Physical Therapy Evaluation Patient Details Name: Penny Barnett MRN: [id number] DOB: [birth year] Today's Date: 05/19/2020   History of Present Illness  Patient is a 69 y.o. female with PMHx of PAF on Eliquis, HTN, HLD, bronchial asthma-who was diagnosed with COVID-19 on 11/27-received monoclonal antibody infusion as an outpatient on 11/29-presented to the hospital with several days history of shortness of breath, she was found to have acute hypoxic respiratory failure due to COVID-19 pneumonia and admitted to the hospitalist service.  Clinical Impression   Pt admitted with above diagnosis. Comes from home where she lives in an apartment with a level entry; independent at baseline, works for a [employer] and walks a lot daily; Presents to PT with generalized weakness and decr activity tolerance;  Pt currently with functional limitations due to the deficits listed below (see PT Problem List). Pt will benefit from skilled PT to increase their independence and safety with mobility to allow discharge to the venue listed below.       Follow Up Recommendations Home health PT;Supervision - Intermittent;Other (comment) (will cont to discern; may not need HHPT f/u)    Equipment Recommendations  3in1 (PT);Rolling walker with 5" wheels;Other (comment) (versus cane)    Recommendations for Other Services       Precautions / Restrictions Precautions Precautions: Fall      Mobility  Bed Mobility                    Transfers Overall transfer level: Needs assistance Equipment used: None Transfers: Sit to/from Stand Sit to Stand: Min guard         General transfer comment: for balance, lines and safety, no assist required  Ambulation/Gait Ambulation/Gait assistance: Min guard Gait Distance (Feet): 120 Feet Assistive device: 4-wheeled walker Gait Pattern/deviations: Step-through pattern     General Gait Details: Cues to self-monitor for activity tolerance; Notably tired  at end of walk  Stairs            Wheelchair Mobility    Modified Rankin (Stroke Patients Only)       Balance Overall balance assessment: Mild deficits observed, not formally tested                                           Pertinent Vitals/Pain Pain Assessment: No/denies pain    Home Living Family/patient expects to be discharged to:: Private residence Living Arrangements: Alone Available Help at Discharge: Family;Other (Comment) (sister lives in same apartment building ) Type of Home: Apartment Home Access: Level entry     Home Layout: One level Home Equipment: Toilet riser      Prior Function Level of Independence: Independent         Comments: 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), walks 3-6 miles daily      Hand Dominance        Extremity/Trunk Assessment   Upper Extremity Assessment Upper Extremity Assessment: Defer to OT evaluation    Lower Extremity Assessment Lower Extremity Assessment: Generalized weakness    Cervical / Trunk Assessment Cervical / Trunk Assessment: Normal  Communication   Communication: No difficulties  Cognition Arousal/Alertness: Awake/alert Behavior During Therapy: WFL for tasks assessed/performed Overall Cognitive Status: Within Functional Limits for tasks assessed  General Comments General comments (skin integrity, edema, etc.): Initiated session on 3 L and titrated to 4 L during progressive amb to ensure good O2 sats (difficulty getting good reading); Ended session seated in recliner, back on 3L supplemental O2, O2 sats 92%; Notable for HR increase to 124 with amb; 87 with seated rest in teh chair    Exercises     Assessment/Plan    PT Assessment Patient needs continued PT services  PT Problem List Decreased strength;Decreased activity tolerance       PT Treatment Interventions DME instruction;Gait training;Stair  training;Functional mobility training;Therapeutic activities;Therapeutic exercise;Balance training;Patient/family education    PT Goals (Current goals can be found in the Care Plan section)  Acute Rehab PT Goals Patient Stated Goal: home, back to her PLOF (walking, working) PT Goal Formulation: With patient Time For Goal Achievement: 06/02/20 Potential to Achieve Goals: Good    Frequency Min 3X/week   Barriers to discharge        Co-evaluation               AM-PAC PT "6 Clicks" Mobility  Outcome Measure Help needed turning from your back to your side while in a flat bed without using bedrails?: None Help needed moving from lying on your back to sitting on the side of a flat bed without using bedrails?: A Little Help needed moving to and from a bed to a chair (including a wheelchair)?: None Help needed standing up from a chair using your arms (e.g., wheelchair or bedside chair)?: None Help needed to walk in hospital room?: A Little Help needed climbing 3-5 steps with a railing? : A Little 6 Click Score: 21    End of Session Equipment Utilized During Treatment: Gait belt;Oxygen Activity Tolerance: Patient tolerated treatment well Patient left: in chair;with call bell/phone within reach Nurse Communication: Mobility status PT Visit Diagnosis: Other abnormalities of gait and mobility (R26.89)    Time: 6384-6659 PT Time Calculation (min) (ACUTE ONLY): 18 min   Charges:   PT Evaluation $PT Eval Low Complexity: 1 Low          Van Clines, PT  Acute Rehabilitation Services Pager [phone] Office [phone]   Levi Aland 05/19/2020, 5:52 PM

## 2020-05-20 LAB — COMPREHENSIVE METABOLIC PANEL
ALT: 38 U/L (ref 0–44)
AST: 35 U/L (ref 15–41)
Albumin: 2.5 g/dL — ABNORMAL LOW (ref 3.5–5.0)
Alkaline Phosphatase: 49 U/L (ref 38–126)
Anion gap: 13 (ref 5–15)
BUN: 23 mg/dL (ref 8–23)
CO2: 26 mmol/L (ref 22–32)
Calcium: 8.6 mg/dL — ABNORMAL LOW (ref 8.9–10.3)
Chloride: 98 mmol/L (ref 98–111)
Creatinine, Ser: 0.63 mg/dL (ref 0.44–1.00)
GFR, Estimated: >60 mL/min (ref >60)
Glucose, Bld: 102 mg/dL — ABNORMAL HIGH (ref 70–99)
Potassium: 3.6 mmol/L (ref 3.5–5.1)
Sodium: 137 mmol/L (ref 135–145)
Total Bilirubin: 0.7 mg/dL (ref 0.3–1.2)
Total Protein: 5.2 g/dL — ABNORMAL LOW (ref 6.5–8.1)

## 2020-05-20 LAB — MAGNESIUM: Magnesium: 1.8 mg/dL (ref 1.7–2.4)

## 2020-05-20 LAB — GLUCOSE, CAPILLARY
Glucose-Capillary: 135 mg/dL — ABNORMAL HIGH (ref 70–99)
Glucose-Capillary: 135 mg/dL — ABNORMAL HIGH (ref 70–99)

## 2020-05-20 LAB — C-REACTIVE PROTEIN: CRP: 1.7 mg/dL — ABNORMAL HIGH (ref <1.0)

## 2020-05-20 MED ORDER — METHYLPREDNISOLONE 4 MG PO TBPK: ORAL_TABLET | ORAL | 0 refills | Status: DC

## 2020-05-20 MED ORDER — METHIMAZOLE 5 MG PO TABS: 5.0000 mg | ORAL_TABLET | Freq: Two times a day (BID) | ORAL | 0 refills | Status: DC

## 2020-05-20 NOTE — Discharge Summary (Signed)
Penny Barnett FGH:[id number] DOB: 07-Jan-1951 DOA: 05/16/2020  PCP: Madelin Headings, MD  Admit date: 05/16/2020  Discharge date: 05/20/2020  Admitted From: Home   Disposition:  Home   Recommendations for Outpatient Follow-up:   Follow up with PCP in 1-2 weeks  PCP Please obtain BMP/CBC, 2 view CXR in 1week,  (see Discharge instructions)   PCP Please follow up on the following pending results: Check TSH, free T4, T3, CBC, CMP and a two-view chest x-ray in 7 to 10 days.  She needs to follow-up with cardiology and endocrine outpatient as well.  Kindly arrange.   Home Health: PT, RN Equipment/Devices: 3 and 1, walker, home oxygen if qualifies. Consultations: Cards Discharge Condition: Stable    CODE STATUS: Full    Diet Recommendation: Heart Healthy   Diet Order            Diet - low sodium heart healthy           Diet Heart Room service appropriate? Yes; Fluid consistency: Thin  Diet effective now                  Chief Complaint  Patient presents with  . Covid Shortness of Breath     Brief history of present illness from the day of admission and additional interim summary    Patient is a 69 y.o. female with PMHx of PAF on Eliquis, HTN, HLD, bronchial asthma-who was diagnosed with COVID-19 on 11/27-received monoclonal antibody infusion as an outpatient on 11/29-presented to the hospital with several days history of shortness of breath, she was found to have acute hypoxic respiratory failure due to COVID-19 pneumonia and admitted to the hospitalist service.  See below for further details.  COVID-19 vaccinated status: Unvaccinated  Significant Events: 11/27>> COVID-19 positive (results in care everywhere) 11/29>> MAB infusion x1 12/2>> Admit to [facility] for hypoxia due to COVID-19  infection  Significant studies: 12/2>>Chest x-ray: Multifocal pneumonia  COVID-19 medications: Steroids: 12/2>> Remdesivir: 12/2>> Baricitinib: 12/2>> Monoclonal antibody infusion: 11/29 x 1  Antibiotics: None  Microbiology data: 12/2 >>blood culture: Pending  Procedures: TTE -EF 60%.  No wall motion abnormality.                                                                 Hospital Course   Acute Hypoxic Resp Failure due to Covid 19 Viral pneumonia: She is unfortunately unvaccinated and had incurred severe parenchymal lung injury, she was treated with IV steroids remdesivir and Baricitinib and thankfully responded well to the treatment, she is currently symptom-free and saturating around 97% on 2 L nasal cannula.  Will do home oxygen eval if qualifies will get home oxygen, she will be placed on Medrol Dosepak along with rescue inhaler as she uses at home, discharge home today with PCP  follow-up.  She refused today's labs as she is feeling excellent and wants to go home.      Recent Labs  Lab 05/16/20 1745 05/16/20 2152 05/17/20 0459 05/18/20 0238 05/19/20 0510 05/20/20 0503  WBC 4.6  --  3.4* 3.5*  --   --   CRP 12.5*  --  10.9* 6.6* 2.8* 1.7*  DDIMER 0.90*  --  0.88* 0.55* 0.46  --   BNP 528.6*  --   --  288.3*  --   --   PROCALCITON <0.10  --   --   --   --   --   LATICACIDVEN 1.8 2.2*  --   --   --   --   AST 26  --  39 26  --  35  ALT 17  --  19 21  --  38  ALKPHOS 54  --  49 46  --  49  BILITOT 1.0  --  1.1 0.5  --  0.7  ALBUMIN 3.0*  --  2.7* 2.6*  --  2.5*      Paroxysmal A. fib with RVR: Rate better controlled-continue bisoprolol-remains on Eliquis.  Echo reviewed and appears to have preserved EF 60% with no acute findings.  Also placed on methimazole for undiagnosed hypothyroidism.    Will be discharged on home dose beta-blocker, Eliquis along with outpatient PCP and primary cardiologist follow-up.  HLD: Continue statin  Bronchial asthma: No  exacerbation-continue bronchodilators  GERD: Continue PPI  Mild hyper thyroidism.  TSH suppressed, free T4 high.  Placed on methimazole.  Outpatient follow-up with PCP, may need outpatient endocrine follow-up will defer to PCP.     Discharge diagnosis     Active Problems:   Pneumonia due to COVID-19 virus    Discharge instructions    Discharge Instructions    Diet - low sodium heart healthy   Complete by: As directed    Discharge instructions   Complete by: As directed    Follow with Primary MD Panosh, Neta Mends, MD in 7 days   Get CBC, CMP, TSH, Free T4, T3, 2 view Chest X ray -  checked next visit within 1 week by Primary MD    Activity: As tolerated with Full fall precautions use walker/cane & assistance as needed  Disposition Home   Diet: Heart Healthy    Special Instructions: If you have smoked or chewed Tobacco  in the last 2 yrs please stop smoking, stop any regular Alcohol  and or any Recreational drug use.  On your next visit with your primary care physician please Get Medicines reviewed and adjusted.  Please request your Prim.MD to go over all Hospital Tests and Procedure/Radiological results at the follow up, please get all Hospital records sent to your Prim MD by signing hospital release before you go home.  If you experience worsening of your admission symptoms, develop shortness of breath, life threatening emergency, suicidal or homicidal thoughts you must seek medical attention immediately by calling 911 or calling your MD immediately  if symptoms less severe.  You Must read complete instructions/literature along with all the possible adverse reactions/side effects for all the Medicines you take and that have been prescribed to you. Take any new Medicines after you have completely understood and accpet all the possible adverse reactions/side effects.   Face-to-face encounter (required for Medicare/Medicaid patients)   Complete by: As directed    I  Susa Raring certify that this patient is under my care and that I,  or a nurse practitioner or physician's assistant working with me, had a face-to-face encounter that meets the physician face-to-face encounter requirements with this patient on 05/20/2020. The encounter with the patient was in whole, or in part for the following medical condition(s) which is the primary reason for home health care (List medical condition): Covid, weakness   The encounter with the patient was in whole, or in part, for the following medical condition, which is the primary reason for home health care: Covid, weakness   I certify that, based on my findings, the following services are medically necessary home health services:  Physical therapy Nursing     Reason for Medically Necessary Home Health Services: Skilled Nursing- Lab Monitoring Requiring Frequent Medication Adjustment   My clinical findings support the need for the above services: Unable to leave home safely without assistance and/or assistive device   Further, I certify that my clinical findings support that this patient is homebound due to: Unable to leave home safely without assistance   For home use only DME 3 n 1   Complete by: As directed    Home Health   Complete by: As directed    To provide the following care/treatments:  PT OT     Increase activity slowly   Complete by: As directed       Discharge Medications   Allergies as of 05/20/2020      Reactions   Penicillins Other (See Comments)   From childhood- reaction not recalled      Medication List    STOP taking these medications   Atrovent HFA 17 MCG/ACT inhaler Generic drug: ipratropium     TAKE these medications   albuterol 108 (90 Base) MCG/ACT inhaler Commonly known as: VENTOLIN HFA INHALE 2 PUFFS INTO THE LUNGS EVERY 6 HOURS AS NEEDED FOR WHEEZING OR SHORTNESS OF BREATH What changed: See the new instructions.   apixaban 5 MG Tabs tablet Commonly known as: Eliquis Take 1  tablet (5 mg total) by mouth 2 (two) times daily.   bisoprolol 5 MG tablet Commonly known as: ZEBETA Take 1 tablet (5 mg total) by mouth daily.   cyclobenzaprine 5 MG tablet Commonly known as: FLEXERIL Take 1 tablet (5 mg total) by mouth 3 (three) times daily as needed for muscle spasms.   Flovent HFA 110 MCG/ACT inhaler Generic drug: fluticasone INHALE 1 PUFF BY MOUTH TWICE A DAY What changed:   how much to take  how to take this  when to take this  additional instructions   Klor-Con M20 20 MEQ tablet Generic drug: potassium chloride SA TAKE 1 TABLET BY MOUTH EVERY DAY What changed: how much to take   lansoprazole 15 MG capsule Commonly known as: PREVACID Take 15 mg by mouth in the morning.   methimazole 5 MG tablet Commonly known as: TAPAZOLE Take 1 tablet (5 mg total) by mouth 2 (two) times daily.   methylPREDNISolone 4 MG Tbpk tablet Commonly known as: MEDROL DOSEPAK follow package directions   montelukast 10 MG tablet Commonly known as: SINGULAIR TAKE 1 TABLET BY MOUTH EVERY DAY IN THE MORNING What changed: See the new instructions.   multivitamin with minerals tablet Take 1 tablet by mouth daily.   rosuvastatin 10 MG tablet Commonly known as: CRESTOR Take 5 mg by mouth daily.   SLOW FE PO Take 1 tablet by mouth daily with breakfast.   triamterene-hydrochlorothiazide 37.5-25 MG tablet Commonly known as: MAXZIDE-25 Take 0.5 tablets by mouth daily.   vitamin C with  rose hips 500 MG tablet Take 500-1,000 mg by mouth daily.   Vitamin D-3 25 MCG (1000 UT) Caps Take 1,000 Units by mouth daily.            Durable Medical Equipment  (From admission, onward)         Start     Ordered   05/20/20 0838  DME 3-in-1  Once        05/20/20 0838   05/20/20 0838  DME Walker  Once       Question Answer Comment  Walker: With 5 Inch Wheels   Patient needs a walker to treat with the following condition Weakness      05/20/20 0838   05/20/20 0000  For  home use only DME 3 n 1        05/20/20 [id number]           Follow-up Information    Panosh, Neta MendsWanda K, MD. Schedule an appointment as soon as possible for a visit in 1 week(s).   Specialties: Internal Medicine, Pediatrics Contact information: [address] [place] [address] [phone]        Pricilla Riffleoss, Paula V, MD. Schedule an appointment as soon as possible for a visit in 1 week(s).   Specialty: Cardiology Contact information: [address] [place] [address] [phone]               Major procedures and Radiology Reports - PLEASE review detailed and final reports thoroughly  -       DG Chest Port 1 View  Result Date: 05/16/2020 CLINICAL DATA:  Increased shortness of breath, COVID-19 diagnosed 5 days ago, tachycardia EXAM: PORTABLE CHEST 1 VIEW COMPARISON:  12/30/2014 FINDINGS: Single frontal view of the chest demonstrates an unremarkable cardiac silhouette. There are diffuse interstitial and ground-glass opacities, with relative sparing of the right lung base, consistent with history of COVID 19 pneumonia. No effusion or pneumothorax. IMPRESSION: 1. Multifocal bilateral pneumonia consistent with COVID 19. Electronically Signed   By: Sharlet SalinaMichael  Brown M.D.   On: 05/16/2020 18:32   ECHOCARDIOGRAM LIMITED  Result Date: 05/17/2020    ECHOCARDIOGRAM LIMITED REPORT   Patient Name:   Penny Barnett Date of Exam: 05/17/2020 Medical Rec #:  [id number]         Height:       64.0 in Accession #:    [id number]        Weight:       160.0 lb Date of Birth:  Jul 23, 1950         BSA:          1.779 m Patient Age:    69 years          BP:           126/82 mmHg Patient Gender: F                 HR:           95 bpm. Exam Location:  Inpatient Procedure: Limited Echo, Limited Color Doppler and Cardiac Doppler Indications:    I48.0 Paroxysmal atrial fibrillation  History:        Patient has prior history of Echocardiogram examinations, most                 recent  01/03/2015. Arrythmias:Atrial Flutter; Risk                 Factors:Hypertension and Dyslipidemia. COVID-19 Postive. GERD.  Sonographer:  Elmarie Shiley Dance Referring Phys: [id number] Oliver Pila HALL  Sonographer Comments: No subcostal window. IMPRESSIONS  1. Left ventricular ejection fraction, by estimation, is 60 to 65%. The left ventricle has normal function. Left ventricular diastolic function could not be evaluated.  2. Right ventricular systolic function is normal. The right ventricular size is normal. There is normal pulmonary artery systolic pressure.  3. The mitral valve is grossly normal. Mild mitral valve regurgitation.  4. The aortic valve is normal in structure. Aortic valve regurgitation is not visualized. No aortic stenosis is present. FINDINGS  Left Ventricle: Left ventricular ejection fraction, by estimation, is 60 to 65%. The left ventricle has normal function. Left ventricular diastolic function could not be evaluated. Left ventricular diastolic function could not be evaluated due to atrial  fibrillation. Right Ventricle: The right ventricular size is normal. No increase in right ventricular wall thickness. Right ventricular systolic function is normal. There is normal pulmonary artery systolic pressure. The tricuspid regurgitant velocity is 2.55 m/s, and  with an assumed right atrial pressure of 8 mmHg, the estimated right ventricular systolic pressure is 34.0 mmHg. Mitral Valve: The mitral valve is grossly normal. Mild mitral valve regurgitation. Tricuspid Valve: The tricuspid valve is normal in structure. Tricuspid valve regurgitation is trivial. Aortic Valve: The aortic valve is normal in structure. Aortic valve regurgitation is not visualized. No aortic stenosis is present. Pulmonic Valve: The pulmonic valve was normal in structure. Pulmonic valve regurgitation is not visualized. Aorta: The aortic root and ascending aorta are structurally normal, with no evidence of dilitation. LEFT VENTRICLE PLAX 2D  LVIDd:         3.77 cm LVIDs:         2.90 cm LV PW:         1.05 cm LV IVS:        1.30 cm LVOT diam:     1.90 cm LV SV:         47 LV SV Index:   27 LVOT Area:     2.84 cm  LEFT ATRIUM         Index LA diam:    4.60 cm 2.59 cm/m  AORTIC VALVE LVOT Vmax:   94.40 cm/s LVOT Vmean:  61.100 cm/s LVOT VTI:    0.168 m  AORTA Ao Root diam: 3.30 cm Ao Asc diam:  3.20 cm MITRAL VALVE                TRICUSPID VALVE MV Area (PHT): 3.96 cm     TR Peak grad:   26.0 mmHg MV Decel Time: 192 msec     TR Vmax:        255.00 cm/s MV E velocity: 109.00 cm/s                             SHUNTS                             Systemic VTI:  0.17 m                             Systemic Diam: 1.90 cm Kristeen Miss MD Electronically signed by Kristeen Miss MD Signature Date/Time: 05/17/2020/2:40:59 PM    Final     Micro Results     Recent Results (from the past 240 hour(s))  Blood Culture (routine x 2)  Status: None (Preliminary result)   Collection Time: 05/16/20 10:16 PM   Specimen: BLOOD LEFT HAND  Result Value Ref Range Status   Specimen Description BLOOD LEFT HAND  Final   Special Requests AEROBIC BOTTLE ONLY Blood Culture adequate volume  Final   Culture   Final    NO GROWTH 3 DAYS Performed at [facility], [address], [place], [place] [address]    Report Status PENDING  Incomplete    Today   Subjective    Penny Barnett today has no headache,no chest abdominal pain,no new weakness tingling or numbness, feels much better wants to go home today.     Objective   Blood pressure 118/88, pulse 85, temperature 98 F (36.7 C), temperature source Oral, resp. rate 19, height 5\' 4"  (1.626 m), weight 75 kg, SpO2 100 %.  No intake or output data in the 24 hours ending 05/20/20 0838  Exam  Awake Alert, No new F.N deficits, Normal affect B[place]hle.AT,PERRAL Supple Neck,No JVD, No cervical lymphadenopathy appriciated.  Symmetrical Chest wall movement, Good air movement bilaterally, CTAB iRRR,No  Gallops,Rubs or new Murmurs, No Parasternal Heave +ve B.Sounds, Abd Soft, Non tender, No organomegaly appriciated, No rebound -guarding or rigidity. No Cyanosis, Clubbing or edema, No new Rash or bruise   Data Review   CBC w Diff:  Lab Results  Component Value Date   WBC 3.5 (L) 05/18/2020   HGB 12.1 05/18/2020   HGB 13.0 06/24/2017   HCT 36.4 05/18/2020   HCT 39.0 06/24/2017   PLT 284 05/18/2020   PLT 210 06/24/2017   LYMPHOPCT 12 05/18/2020   MONOPCT 7 05/18/2020   EOSPCT 0 05/18/2020   BASOPCT 0 05/18/2020    CMP:  Lab Results  Component Value Date   NA 137 05/20/2020   NA 140 07/02/2017   K 3.6 05/20/2020   CL 98 05/20/2020   CO2 26 05/20/2020   BUN 23 05/20/2020   BUN 23 07/02/2017   CREATININE 0.63 05/20/2020   CREATININE 0.76 01/23/2020   PROT 5.2 (L) 05/20/2020   ALBUMIN 2.5 (L) 05/20/2020   BILITOT 0.7 05/20/2020   ALKPHOS 49 05/20/2020   AST 35 05/20/2020   ALT 38 05/20/2020  .  Lab Results  Component Value Date   TSH 0.271 (L) 05/18/2020    Total Time in preparing paper work, data evaluation and todays exam - 35 minutes  14/09/2019 M.D on 05/20/2020 at 8:38 AM  [facility]   Office  [phone]

## 2020-05-20 NOTE — Progress Notes (Signed)
SATURATION QUALIFICATIONS: (This note is used to comply with regulatory documentation for home oxygen)  Patient Saturations on Room Air at Rest = 89%  Patient Saturations on Room Air while Ambulating = 85%  Patient Saturations on 3 Liters of oxygen while Ambulating = 93%  Please briefly explain why patient needs home oxygen: pt requiring O2 at this time

## 2020-05-20 NOTE — TOC Transition Note (Addendum)
Transition of Care [facility]) - CM/SW Discharge Note   Patient Details  Name: Penny Barnett MRN: [id number] Date of Birth: 04/19/1951  [facility] CM/SW Contact:  Leone Haven, RN Phone Number: 05/20/2020, 10:23 AM   Clinical Narrative:    NCM spoke with patient on the phone, she states she does not want HHPT, she states her sister lives right below her so she does not think she needs it but would like the rolling walker and home oxygen.  She states it is ok to use [facility] . NCM made referral to [facility] for rolling walker and home oxygen, awaiting oxygen order. NCM gave 30 day coupon for eliquis to Secretary to give to patient.    Final next level of care: Home/Self Care Barriers to Discharge: No Barriers Identified   Patient Goals and CMS Choice        Discharge Placement                       Discharge Plan and Services                DME Arranged: Walker rolling, Oxygen DME Agency: [facility]       HH Arranged: Refused HH, PT          Social Determinants of Health (SDOH) Interventions     Readmission Risk Interventions No flowsheet data found.

## 2020-05-20 NOTE — Progress Notes (Signed)
Occupational Therapy Treatment Patient Details Name: Penny Barnett MRN: [id number] DOB: 30-Aug-1950 Today's Date: 05/20/2020    History of present illness Patient is a 69 y.o. female with PMHx of PAF on Eliquis, HTN, HLD, bronchial asthma-who was diagnosed with COVID-19 on 11/27-received monoclonal antibody infusion as an outpatient on 11/29-presented to the hospital with several days history of shortness of breath, she was found to have acute hypoxic respiratory failure due to COVID-19 pneumonia and admitted to the hospitalist service.   OT comments  Pt desats on RA during activity to 85 with 2/4 DOE.Able to maintain SpO2 in 90s on 3L.Educated ptonenergy conservation strategies,strategies to reduce risk of falls, standing HEP and use of DME to maximize safety and independence with ADL and functional mobility for ADL.  Follow Up Recommendations  Supervision - Intermittent;No OT follow up    Equipment Recommendations  3 in 1 bedside commode    Recommendations for Other Services      Precautions / Restrictions Precautions Precautions: Fall       Mobility Bed Mobility Overal bed mobility: Modified Independent                Transfers Overall transfer level: Modified independent                    Balance Overall balance assessment: Mild deficits observed, not formally tested                                         ADL either performed or assessed with clinical judgement   ADL                                       Functional mobility during ADLs: Supervision/safety General ADL Comments: overall set up for ADL.Educated pt on energy conservation strategies, strategies to reduce risk of falls and pursed lip breathing. Desats to 85onRAduring activity with increased WOB. Recommend use of 3in1 by bed at night and 3in1 as shower seat. Educated pton safety regarding mobilizing with O2 tubing.     Vision       Perception      Praxis      Cognition Arousal/Alertness: Awake/alert Behavior During Therapy: WFL for tasks assessed/performed Overall Cognitive Status: Within Functional Limits for tasks assessed                                          Exercises Exercises: Other exercises Other Exercises Other Exercises: stanidng HEP program reviewed - written information provided Other Exercises: theraband - level2 - seated BUE AROM   Shoulder Instructions       General Comments      Pertinent Vitals/ Pain       Pain Assessment: No/denies pain  Home Living                                          Prior Functioning/Environment              Frequency  Min 2X/week        Progress Toward Goals  OT Goals(current goals can now be found  in the care plan section)  Progress towards OT goals: Progressing toward goals  Acute Rehab OT Goals Patient Stated Goal: home, back to her PLOF (walking, working) OT Goal Formulation: With patient Time For Goal Achievement: 06/01/20 Potential to Achieve Goals: Good ADL Goals Pt Will Perform Grooming: with modified independence;standing Pt Will Perform Lower Body Bathing: with modified independence;sit to/from stand Pt Will Perform Lower Body Dressing: with modified independence;sit to/from stand Pt Will Transfer to Toilet: with modified independence;ambulating Pt Will Perform Toileting - Clothing Manipulation and hygiene: with modified independence;sit to/from stand Pt/caregiver will Perform Home Exercise Program: Increased strength;Both right and left upper extremity;With written HEP provided;Independently Additional ADL Goal #1: Pt will independently demonstrate energy conservation strategies during ADL/functional task.  Plan Discharge plan remains appropriate    Co-evaluation                 AM-PAC OT "6 Clicks" Daily Activity     Outcome Measure   Help from another person eating meals?: None Help from  another person taking care of personal grooming?: None Help from another person toileting, which includes using toliet, bedpan, or urinal?: A Little Help from another person bathing (including washing, rinsing, drying)?: A Little Help from another person to put on and taking off regular upper body clothing?: A Little Help from another person to put on and taking off regular lower body clothing?: A Little 6 Click Score: 20    End of Session Equipment Utilized During Treatment: Oxygen (3L)  OT Visit Diagnosis: Unsteadiness on feet (R26.81);Other (comment);Other abnormalities of gait and mobility (R26.89)   Activity Tolerance Patient tolerated treatment well   Patient Left in chair;with call bell/phone within reach   Nurse Communication Mobility status;Other (comment) (need for 3in1)        Time: 4235-3614 OT Time Calculation (min): 26 min  Charges: OT General Charges $OT Visit: 1 Visit OT Treatments $Self Care/Home Management : 23-37 mins  Luisa Dago, OT/L   Acute OT Clinical Specialist Acute Rehabilitation Services Pager [phone] Office [phone]    [facility] 05/20/2020, 12:21 PM

## 2020-05-20 NOTE — Progress Notes (Signed)
Penny Barnett to be D/C'd Home per MD order.  Discussed with the patient and all questions fully answered.  VSS, Skin clean, dry and intact without evidence of skin break down, no evidence of skin tears noted. IV catheter discontinued intact. Site without signs and symptoms of complications. Dressing and pressure applied.  An After Visit Summary was printed and given to the patient. Patient received eliquis voucher, DME, and home O2.  D/c education completed with patient including follow up instructions, medication list, d/c activities limitations if indicated, with other d/c instructions as indicated by MD - patient able to verbalize understanding, all questions fully answered.   Patient instructed to return to ED, call 911, or call MD for any changes in condition.   Patient escorted via WC, and D/C home via private auto.  [name] 05/20/2020 3:21 PM

## 2020-05-20 NOTE — TOC Benefit Eligibility Note (Signed)
Transition of Care [facility]) Benefit Eligibility Note    Patient Details  Name: Penny Barnett MRN: [id number] Date of Birth: 07/18/1950   Medication/Dose: Eliquis  Covered?: Yes     Prescription Coverage [facility] [place]  Spoke with Person/Company/Phone Number:: [employer] [facility] [employer]  Co-Pay: $131.75 for 30 day supply/ $395.26  Prior Approval: No     Additional Notes: patient is currently in the coverage gap.    Penny Barnett Phone Number: 05/20/2020, 12:40 PM

## 2020-05-21 ENCOUNTER — Telehealth: Payer: Self-pay | Admitting: Internal Medicine

## 2020-05-21 LAB — CULTURE, BLOOD (ROUTINE X 2): Culture: NO GROWTH

## 2020-05-21 NOTE — Telephone Encounter (Signed)
Transition Care Management Unsuccessful Follow-up Telephone Call  Date of discharge and from where:  [facility] on 05/20/2020   Attempts:  1st Attempt  Reason for unsuccessful TCM follow-up call:  Left voice message

## 2020-05-21 NOTE — Telephone Encounter (Signed)
Transition Care Management Follow-up Telephone Call  Date of discharge and from where: 05/20/2020 from [facility]   How have you been since you were released from the hospital? Patient states she is doing better   Any questions or concerns? No  Items Reviewed:  Did the pt receive and understand the discharge instructions provided? Yes   Medications obtained and verified? Yes   Other? No   Any new allergies since your discharge? No   Dietary orders reviewed? No  Do you have support at home? Yes   Home Care and Equipment/Supplies: Were home health services ordered? no If so, what is the name of the agency?   Has the agency set up a time to come to the patient's home? not applicable Were any new equipment or medical supplies ordered?  Yes: Rolling walker, oxygen  What is the name of the medical supply agency? [facility] Were you able to get the supplies/equipment? yes Do you have any questions related to the use of the equipment or supplies? No  Functional Questionnaire: (I = Independent and D = Dependent) ADLs: I  Bathing/Dressing- I  Meal Prep- I  Eating- I  Maintaining continence- I  Transferring/Ambulation- I  Managing Meds- I  Follow up appointments reviewed:   PCP Hospital f/u appt confirmed? Yes  Scheduled to see Dr. Fabian Sharp on 06/10/2020 @ 10:30 am.  Specialist Hospital f/u appt confirmed? No    Are transportation arrangements needed? No   If their condition worsens, is the pt aware to call PCP or go to the Emergency Dept.? Yes  Was the patient provided with contact information for the PCP's office or ED? Yes  Was to pt encouraged to call back with questions or concerns? Yes

## 2020-05-22 LAB — CULTURE, BLOOD (ROUTINE X 2)
Culture: NO GROWTH
Special Requests: ADEQUATE

## 2020-05-25 ENCOUNTER — Other Ambulatory Visit: Payer: Self-pay | Admitting: Internal Medicine

## 2020-05-26 DIAGNOSIS — J1282 Pneumonia due to coronavirus disease 2019: Secondary | ICD-10-CM

## 2020-05-27 NOTE — Telephone Encounter (Signed)
I placed order for CXR .  Don't know where she is getting labs on Wed but she could get the CXR over at [facility] as walk in

## 2020-05-29 ENCOUNTER — Other Ambulatory Visit: Payer: Medicare HMO

## 2020-06-04 NOTE — Progress Notes (Signed)
Chief Complaint  Patient presents with  . Follow-up    Hospital discharge from having covid    HPI: Penny Barnett 69 y.o. come in for post hospital  For covid pna  And a flutter   12-2 - 12 6 2021 -  Since discharge she is done okay no increasing shortness of breath not using oxygen anymore Had been hospitalized after pneumonia for atrial flutter she is on anticoagulation Does not seem to be bothering her now Does not yet had a follow-up with cardiology.  Noted also  to have Low tsh and elevated free T4 .  In hospital of note she has been taking biotin most days for the last few months not sure why. She was given Tapazole 5 mg in the hospital but did not continue to take it because she was not sure that she should take a new medicine she has been off this medication for the last week. She has not taken biotin today. No personal or family history of thyroid disease. She had some elevations of blood sugar in the hospital was given high-dose steroids but declined the insulin. She continues to take her inhalers and beta-blocker potassium Prevacid Crestor and Maxide.   Lost taste buds .  During Covid but it came back after the monoclonal antibody infusion About 60 % back to  Normal   energy.  No new problems. ROS: See pertinent positives and negatives per HPI.  Past Medical History:  Diagnosis Date  . Allergy   . Anemia   . Arthritis    hands  . Asthma    as a child and then attack 1996  poss allergic to cat hair. ventilated x 2 . saw Dr [place] Callas then .   Marland Kitchen Atrial flutter (HCC)    a. 12/2014 Echo: EF 60-65%, no rwma, mild MR;  b. S/P TEE/DCCV;  c. CHA2DS2VASc = 3-->Eliquis.  . Cataract    Had surgery to remove  . GERD (gastroesophageal reflux disease)    2000 taking otc prevacid   evey day.   . Hyperlipidemia   . Hypertension     3 meds controlled  . Kidney stone    passed stone - no surgery required    Family History  Problem Relation Age of Onset  . Breast cancer  Mother   . Bladder Cancer Mother   . Uterine cancer Sister   . Alcohol abuse Father   . Breast cancer Maternal Grandmother   . Colon cancer Neg Hx   . Heart attack Neg Hx   . Stroke Neg Hx   . Hypertension Neg Hx   . Esophageal cancer Neg Hx   . Rectal cancer Neg Hx   . Stomach cancer Neg Hx     Social History   Socioeconomic History  . Marital status: Single    Spouse name: Not on file  . Number of children: Not on file  . Years of education: Not on file  . Highest education level: Not on file  Occupational History  . Occupation: CSR  Tobacco Use  . Smoking status: Former Smoker    Packs/day: 1.00    Years: 20.00    Pack years: 20.00    Types: Cigarettes    Quit date: 06/21/1984    Years since quitting: 35.9  . Smokeless tobacco: Never Used  Vaping Use  . Vaping Use: Never used  Substance and Sexual Activity  . Alcohol use: No    Alcohol/week: 0.0 standard drinks  .  Drug use: No  . Sexual activity: Not on file  Other Topics Concern  . 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    HS education  single   6 hours of sleep   Hh  Of 1  Pet dog and a horse.    Remote tobacco  1-2 ppd stopped 1988    Etoh couple times a month rare    ocass  Diet soda.             Social Determinants of Health   Financial Resource Strain: Not on file  Food Insecurity: Not on file  Transportation Needs: Not on file  Physical Activity: Not on file  Stress: Not on file  Social Connections: Not on file    Outpatient Medications Prior to Visit  Medication Sig Dispense Refill  . albuterol (VENTOLIN HFA) 108 (90 Base) MCG/ACT inhaler INHALE 2 PUFFS INTO THE LUNGS EVERY 6 HOURS AS NEEDED FOR WHEEZING OR SHORTNESS OF BREATH (Patient taking differently: Inhale 2 puffs into the lungs every 6 (six) hours as needed for wheezing or shortness of  breath.) 54 each 1  . apixaban (ELIQUIS) 5 MG TABS tablet Take 1 tablet (5 mg total) by mouth 2 (two) times daily. 180 tablet 1  . Ascorbic Acid (VITAMIN C WITH ROSE HIPS) 500 MG tablet Take 500-1,000 mg by mouth daily.    . bisoprolol (ZEBETA) 5 MG tablet Take 1 tablet (5 mg total) by mouth daily. 90 tablet 3  . Cholecalciferol (VITAMIN D-3) 25 MCG (1000 UT) CAPS Take 1,000 Units by mouth daily.    . cyclobenzaprine (FLEXERIL) 5 MG tablet Take 1 tablet (5 mg total) by mouth 3 (three) times daily as needed for muscle spasms. 15 tablet 0  . Ferrous Sulfate (SLOW FE PO) Take 1 tablet by mouth daily with breakfast.    . fluticasone (FLOVENT HFA) 110 MCG/ACT inhaler INHALE 1 PUFF BY MOUTH TWICE A DAY (Patient taking differently: Inhale 1-2 puffs into the lungs 2 (two) times daily.) 12 each 1  . KLOR-CON M20 20 MEQ tablet TAKE 1 TABLET BY MOUTH EVERY DAY 90 tablet 0  . lansoprazole (PREVACID) 15 MG capsule Take 15 mg by mouth in the morning.     . montelukast (SINGULAIR) 10 MG tablet TAKE 1 TABLET BY MOUTH EVERY DAY IN THE MORNING 90 tablet 0  . Multiple Vitamins-Minerals (MULTIVITAMIN WITH MINERALS) tablet Take 1 tablet by mouth daily.    . rosuvastatin (CRESTOR) 10 MG tablet Take 5 mg by mouth daily.     Marland Kitchen triamterene-hydrochlorothiazide (MAXZIDE-25) 37.5-25 MG tablet Take 0.5 tablets by mouth daily. 45 tablet 3  . methimazole (TAPAZOLE) 5 MG tablet Take 1 tablet (5 mg total) by mouth 2 (two) times daily. (Patient not taking: Reported on 06/05/2020) 60 tablet 0  . methylPREDNISolone (MEDROL DOSEPAK) 4 MG TBPK tablet follow package directions (Patient not taking: Reported on 06/05/2020) 21 tablet 0   No facility-administered medications prior to visit.     EXAM:  BP 120/82   Pulse 71   Temp 98.2 F (36.8 C) (Oral)   Ht 5\' 4"  (1.626 m)   Wt 161 lb 12.8 oz (73.4 kg)   SpO2 97%   BMI 27.77 kg/m   Body mass index  is 27.77 kg/m.  GENERAL: vitals reviewed and listed above, alert, oriented,  appears well hydrated and in no acute distress HEENT: atraumatic, conjunctiva  clear, no obvious abnormalities on inspection of external nose and ears OP : masked  NECK: no obvious masses on inspection palpation  LUNGS: clear to auscultation bilaterally, no wheezes, rales or rhonchi, good air movement CV: HRRR, no clubbing cyanosis or  peripheral edema nl cap refill  MS: moves all extremities without noticeable focal  abnormality PSYCH: pleasant and cooperative, no obvious depression or anxiety Lab Results  Component Value Date   WBC 3.5 (L) 05/18/2020   HGB 12.1 05/18/2020   HCT 36.4 05/18/2020   PLT 284 05/18/2020   GLUCOSE 102 (H) 05/20/2020   CHOL 283 (H) 01/23/2020   TRIG 185 (H) 05/16/2020   HDL 57 01/23/2020   LDLDIRECT 129.0 11/28/2018   LDLCALC 170 (H) 01/23/2020   ALT 38 05/20/2020   AST 35 05/20/2020   NA 137 05/20/2020   K 3.6 05/20/2020   CL 98 05/20/2020   CREATININE 0.63 05/20/2020   BUN 23 05/20/2020   CO2 26 05/20/2020   TSH 0.271 (L) 05/18/2020   HGBA1C 5.9 (H) 01/23/2020   BP Readings from Last 3 Encounters:  06/05/20 120/82  05/20/20 118/88  05/13/20 126/62    ASSESSMENT AND PLAN:  Discussed the following assessment and plan:  Pneumonia due to COVID-19 virus - Plan: CBC with Differential/Platelet, BASIC METABOLIC PANEL WITH GFR, T4, free, TSH, T3, free, DG Chest 2 View, T3, free, TSH, T4, free, BASIC METABOLIC PANEL WITH GFR, CBC with Differential/Platelet  Hospital discharge follow-up - Plan: CBC with Differential/Platelet, BASIC METABOLIC PANEL WITH GFR, T4, free, TSH, T3, free, T3, free, TSH, T4, free, BASIC METABOLIC PANEL WITH GFR, CBC with Differential/Platelet  COVID-19 - Plan: CBC with Differential/Platelet, BASIC METABOLIC PANEL WITH GFR, T4, free, TSH, T3, free, DG Chest 2 View, T3, free, TSH, T4, free, BASIC METABOLIC PANEL WITH GFR, CBC with Differential/Platelet  Thyroid function study abnormality - Plan: CBC with Differential/Platelet,  BASIC METABOLIC PANEL WITH GFR, T4, free, TSH, T3, free, T3, free, TSH, T4, free, BASIC METABOLIC PANEL WITH GFR, CBC with Differential/Platelet  Chronic anticoagulation  Atrial flutter, unspecified type (HCC)  Medication management Discussed thyroid situation.  Will repeat today she is off Tapazole for a week has not taken biotin today but asked her to stop for now. Counseled that hyperthyroidism if real is a trigger for rapid heart rate problems. Discussed visiting evaluation for Covid immunization consideration in 4 to 6 months.  Make appt with cards fu  X ray and labs today b 45 minutes today visit counsel review  Plan follow  -Patient advised to return or notify health care team  if  new concerns arise.  Patient Instructions  Lab and x ray todayu   Fatigue can last a while Thyroid issue : stop the biotin for now.   We may advise a endocrinology  Consult.   Over active thyroid can effect heart rate and   Function.   Plan follow up depending .        Neta Mends. Clementina Mareno M.D.

## 2020-06-05 ENCOUNTER — Ambulatory Visit (INDEPENDENT_AMBULATORY_CARE_PROVIDER_SITE_OTHER): Payer: Medicare HMO | Admitting: Internal Medicine

## 2020-06-05 ENCOUNTER — Ambulatory Visit (INDEPENDENT_AMBULATORY_CARE_PROVIDER_SITE_OTHER): Payer: Medicare HMO

## 2020-06-05 ENCOUNTER — Encounter: Payer: Self-pay | Admitting: Internal Medicine

## 2020-06-05 ENCOUNTER — Other Ambulatory Visit: Payer: Self-pay

## 2020-06-05 VITALS — BP 120/82 | HR 71 | Temp 98.2°F | Ht 64.0 in | Wt 161.8 lb

## 2020-06-05 DIAGNOSIS — U071 COVID-19: Secondary | ICD-10-CM | POA: Diagnosis not present

## 2020-06-05 DIAGNOSIS — R946 Abnormal results of thyroid function studies: Secondary | ICD-10-CM | POA: Diagnosis not present

## 2020-06-05 DIAGNOSIS — Z79899 Other long term (current) drug therapy: Secondary | ICD-10-CM

## 2020-06-05 DIAGNOSIS — J1282 Pneumonia due to coronavirus disease 2019: Secondary | ICD-10-CM

## 2020-06-05 DIAGNOSIS — Z7901 Long term (current) use of anticoagulants: Secondary | ICD-10-CM

## 2020-06-05 DIAGNOSIS — Z09 Encounter for follow-up examination after completed treatment for conditions other than malignant neoplasm: Secondary | ICD-10-CM

## 2020-06-05 DIAGNOSIS — I4892 Unspecified atrial flutter: Secondary | ICD-10-CM

## 2020-06-05 NOTE — Progress Notes (Signed)
X ray shows resolved pneumonia with some  residual scarring      waiting on blood work  results

## 2020-06-05 NOTE — Patient Instructions (Signed)
Lab and x ray todayu   Fatigue can last a while Thyroid issue : stop the biotin for now.   We may advise a endocrinology  Consult.   Over active thyroid can effect heart rate and   Function.   Plan follow up depending .

## 2020-06-06 ENCOUNTER — Other Ambulatory Visit: Payer: Self-pay

## 2020-06-06 DIAGNOSIS — R899 Unspecified abnormal finding in specimens from other organs, systems and tissues: Secondary | ICD-10-CM

## 2020-06-06 LAB — BASIC METABOLIC PANEL WITH GFR
BUN: 20 mg/dL (ref 7–25)
CO2: 30 mmol/L (ref 20–32)
Calcium: 9.5 mg/dL (ref 8.6–10.4)
Chloride: 98 mmol/L (ref 98–110)
Creat: 0.62 mg/dL (ref 0.60–0.93)
GFR, Est African American: 105 mL/min/{1.73_m2} (ref >=60)
GFR, Est Non African American: 91 mL/min/{1.73_m2} (ref >=60)
Glucose, Bld: 102 mg/dL — ABNORMAL HIGH (ref 65–99)
Potassium: 3.7 mmol/L (ref 3.5–5.3)
Sodium: 140 mmol/L (ref 135–146)

## 2020-06-06 LAB — CBC WITH DIFFERENTIAL/PLATELET
Absolute Monocytes: 479 cells/uL (ref 200–950)
Basophils Absolute: 23 cells/uL (ref 0–200)
Basophils Relative: 0.4 %
Eosinophils Absolute: 23 cells/uL (ref 15–500)
Eosinophils Relative: 0.4 %
HCT: 38.6 % (ref 35.0–45.0)
Hemoglobin: 12.5 g/dL (ref 11.7–15.5)
Lymphs Abs: 1191 cells/uL (ref 850–3900)
MCH: 27.8 pg (ref 27.0–33.0)
MCHC: 32.4 g/dL (ref 32.0–36.0)
MCV: 86 fL (ref 80.0–100.0)
MPV: 10.7 fL (ref 7.5–12.5)
Monocytes Relative: 8.4 %
Neutro Abs: 3984 cells/uL (ref 1500–7800)
Neutrophils Relative %: 69.9 %
Platelets: 146 10*3/uL (ref 140–400)
RBC: 4.49 10*6/uL (ref 3.80–5.10)
RDW: 14.5 % (ref 11.0–15.0)
Total Lymphocyte: 20.9 %
WBC: 5.7 10*3/uL (ref 3.8–10.8)

## 2020-06-06 LAB — T3, FREE: T3, Free: 3.3 pg/mL (ref 2.3–4.2)

## 2020-06-06 LAB — T4, FREE: Free T4: 1.2 ng/dL (ref 0.8–1.8)

## 2020-06-06 LAB — TSH: TSH: 1 mIU/L (ref 0.40–4.50)

## 2020-06-06 NOTE — Progress Notes (Signed)
Thyroid is now normal .  As well as kidney function.  Advise stay off the biotin   Lab in 2 months  please order  tsh free t3 free t4 and thyroid antibodies  dx abnormal lab tests  Then ROV with results

## 2020-06-10 ENCOUNTER — Inpatient Hospital Stay: Payer: Medicare HMO | Admitting: Internal Medicine

## 2020-06-21 ENCOUNTER — Other Ambulatory Visit: Payer: Self-pay | Admitting: Internal Medicine

## 2020-07-19 NOTE — Addendum Note (Signed)
Addended by: Lerry Liner on: 07/19/2020 03:40 PM   Modules accepted: Orders

## 2020-07-23 ENCOUNTER — Other Ambulatory Visit: Payer: Self-pay

## 2020-07-23 ENCOUNTER — Other Ambulatory Visit (INDEPENDENT_AMBULATORY_CARE_PROVIDER_SITE_OTHER): Payer: Medicare Other

## 2020-07-23 DIAGNOSIS — R899 Unspecified abnormal finding in specimens from other organs, systems and tissues: Secondary | ICD-10-CM | POA: Diagnosis not present

## 2020-07-23 LAB — TSH: TSH: 2.05 u[IU]/mL (ref 0.35–4.50)

## 2020-07-23 LAB — T4, FREE: Free T4: 0.9 ng/dL (ref 0.60–1.60)

## 2020-07-23 LAB — T3, FREE: T3, Free: 4.1 pg/mL (ref 2.3–4.2)

## 2020-07-28 NOTE — H&P (View-Only) (Signed)
Cardiology Office Note   Date:  07/29/2020   ID:  Penny Barnett, DOB [birth year], MRN [id number]  PCP:  Penny Headings, MD  Cardiologist:   Penny Pates, MD   F/U of PA flutter and HTN      History of Present Illness: Penny Barnett is a 70 y.o. female with a history of atrial flutter    S/p TEE /cardioversion in 2016   Also a hx of HTN, HL, asthma I saw the pt in clinicin Jan 2020 The pt was admitted in November for COVID infeciton   She received monoclonal AB but then was hospitalzed on 05/13/20 with hypoxia  FOund to be in atrial fibrillation     She was found to be hyperthroid and was placed on methmizole Sent home on b blocker and Eliquis  Since d/c she is no longer on methimazole    She denies CP   Still with some SOB   No dizziness    No outpatient medications have been marked as taking for the 07/29/20 encounter (Office Visit) with Penny Riffle, MD.     Allergies:   Penicillins   Past Medical History:  Diagnosis Date  . Allergy   . Anemia   . Arthritis    hands  . Asthma    as a child and then attack 1996  poss allergic to cat hair. ventilated x 2 . saw Dr Penny Barnett then .   Marland Kitchen Atrial flutter (HCC)    a. 12/2014 Echo: EF 60-65%, no rwma, mild MR;  b. S/P TEE/DCCV;  c. CHA2DS2VASc = 3-->Eliquis.  . Cataract    Had surgery to remove  . GERD (gastroesophageal reflux disease)    2000 taking otc prevacid   evey day.   . Hyperlipidemia   . Hypertension     3 meds controlled  . Kidney stone    passed stone - no surgery required    Past Surgical History:  Procedure Laterality Date  . CARDIOVERSION N/A 01/03/2015   Procedure: CARDIOVERSION;  Surgeon: Penny Riffle, MD;  Location: [facility] ENDOSCOPY;  Service: Cardiovascular;  Laterality: N/A;  . CATARACT EXTRACTION Bilateral 10/14/2010   both eyes  . COLONOSCOPY  02/2011   Penny Barnett  . SHOULDER SURGERY Bilateral    x 2 - frozen shoulder rt 2006, 2000 left   . TEE WITHOUT CARDIOVERSION N/A 01/03/2015   Procedure:  TRANSESOPHAGEAL ECHOCARDIOGRAM (TEE);  Surgeon: Penny Riffle, MD;  Location[facility] ENDOSCOPY;  Service: Cardiovascular;  Laterality: N/A;  . TONSILLECTOMY       Social History:  The patient  reports that she quit smoking about 36 years ago. Her smoking use included cigarettes. She has a 20.00 pack-year smoking history. She has never used smokeless tobacco. She reports that she does not drink alcohol and does not use drugs.   Family History:  The patient's family history includes Alcohol abuse in her father; Bladder Cancer in her mother; Breast cancer in her maternal grandmother and mother; Uterine cancer in her sister.    ROS:  Please see the history of present illness. All other systems are reviewed and  Negative to the above problem except as noted.    PHYSICAL EXAM: VS:  BP 128/72   Pulse 96   Ht 5\' 4"  (1.626 m)   Wt 178 lb 3.2 oz (80.8 kg)   BMI 30.59 kg/m   GEN: Well nourished, well developed, in no acute distress  HEENT: normal  Neck: JVP normal  No carotid bruits Cardiac: Irreg irreg  ; no murmurs   No LE  edema  Respiratory:  clear to auscultation bilaterally, GI: soft, nontender, nondistended, + BS  No hepatomegaly  MS: no deformity Moving all extremities   Skin: warm and dry, no rash Neuro:  Strength and sensation are intact Psych: euthymic mood, full affect   EKG:  EKG is ordered today.   Atrial fibrillation with occasional PVC   96 bpm     Lipid Panel    Component Value Date/Time   CHOL 223 (H) 07/29/2020 1208   TRIG 182 (H) 07/29/2020 1208   HDL 58 07/29/2020 1208   CHOLHDL 3.8 07/29/2020 1208   CHOLHDL 5.0 (H) 01/23/2020 1022   VLDL 47.0 (H) 11/28/2018 1048   LDLCALC 133 (H) 07/29/2020 1208   LDLCALC 170 (H) 01/23/2020 1022   LDLDIRECT 129.0 11/28/2018 1048      Wt Readings from Last 3 Encounters:  07/29/20 178 lb 3.2 oz (80.8 kg)  06/05/20 161 lb 12.8 oz (73.4 kg)  05/17/20 165 lb 5.5 oz (75 kg)      ASSESSMENT AND PLAN:  1   Atrial fibrillation    Pt found to be in atrial fibrillation   She did have COVID and was thyroid was subtlely off    She does have some SOB with activity   May be related to afib     She has not missed any Eliquis   Will therefore set up for cardioversion   2  HL  Lpids in AUg very high   WIll repeat  3  HTN  BP is OK   Follow  I will recomm f/u in cardiology in May 2022      CHeck:  CBC, BMET, lipids     Signed, Penny Pates, MD  07/29/2020 10:45 PM    [facility] [address] [place], [place], [place]  [address] Phone: [phone]; Fax: [phone]

## 2020-07-28 NOTE — Progress Notes (Unsigned)
Cardiology Office Note   Date:  07/29/2020   ID:  Penny Barnett, DOB [birth year], MRN [id number]  PCP:  Madelin Headings, MD  Cardiologist:   Dietrich Pates, MD   F/U of PA flutter and HTN      History of Present Illness: Penny Barnett is a 70 y.o. female with a history of atrial flutter    S/p TEE /cardioversion in 2016   Also a hx of HTN, HL, asthma I saw the pt in clinicin Jan 2020 The pt was admitted in November for COVID infeciton   She received monoclonal AB but then was hospitalzed on 05/13/20 with hypoxia  FOund to be in atrial fibrillation     She was found to be hyperthroid and was placed on methmizole Sent home on b blocker and Eliquis  Since d/c she is no longer on methimazole    She denies CP   Still with some SOB   No dizziness    No outpatient medications have been marked as taking for the 07/29/20 encounter (Office Visit) with Pricilla Riffle, MD.     Allergies:   Penicillins   Past Medical History:  Diagnosis Date  . Allergy   . Anemia   . Arthritis    hands  . Asthma    as a child and then attack 1996  poss allergic to cat hair. ventilated x 2 . saw Dr [place] Callas then .   Marland Kitchen Atrial flutter (HCC)    a. 12/2014 Echo: EF 60-65%, no rwma, mild MR;  b. S/P TEE/DCCV;  c. CHA2DS2VASc = 3-->Eliquis.  . Cataract    Had surgery to remove  . GERD (gastroesophageal reflux disease)    2000 taking otc prevacid   evey day.   . Hyperlipidemia   . Hypertension     3 meds controlled  . Kidney stone    passed stone - no surgery required    Past Surgical History:  Procedure Laterality Date  . CARDIOVERSION N/A 01/03/2015   Procedure: CARDIOVERSION;  Surgeon: Pricilla Riffle, MD;  Location: [facility] ENDOSCOPY;  Service: Cardiovascular;  Laterality: N/A;  . CATARACT EXTRACTION Bilateral 10/14/2010   both eyes  . COLONOSCOPY  02/2011   Arlyce Dice  . SHOULDER SURGERY Bilateral    x 2 - frozen shoulder rt 2006, 2000 left   . TEE WITHOUT CARDIOVERSION N/A 01/03/2015   Procedure:  TRANSESOPHAGEAL ECHOCARDIOGRAM (TEE);  Surgeon: Pricilla Riffle, MD;  Location: [facility] ENDOSCOPY;  Service: Cardiovascular;  Laterality: N/A;  . TONSILLECTOMY       Social History:  The patient  reports that she quit smoking about 36 years ago. Her smoking use included cigarettes. She has a 20.00 pack-year smoking history. She has never used smokeless tobacco. She reports that she does not drink alcohol and does not use drugs.   Family History:  The patient's family history includes Alcohol abuse in her father; Bladder Cancer in her mother; Breast cancer in her maternal grandmother and mother; Uterine cancer in her sister.    ROS:  Please see the history of present illness. All other systems are reviewed and  Negative to the above problem except as noted.    PHYSICAL EXAM: VS:  BP 128/72   Pulse 96   Ht 5\' 4"  (1.626 m)   Wt 178 lb 3.2 oz (80.8 kg)   BMI 30.59 kg/m   GEN: Well nourished, well developed, in no acute distress  HEENT: normal  Neck: JVP normal  No carotid bruits Cardiac: Irreg irreg  ; no murmurs   No LE  edema  Respiratory:  clear to auscultation bilaterally, GI: soft, nontender, nondistended, + BS  No hepatomegaly  MS: no deformity Moving all extremities   Skin: warm and dry, no rash Neuro:  Strength and sensation are intact Psych: euthymic mood, full affect   EKG:  EKG is ordered today.   Atrial fibrillation with occasional PVC   96 bpm     Lipid Panel    Component Value Date/Time   CHOL 223 (H) 07/29/2020 1208   TRIG 182 (H) 07/29/2020 1208   HDL 58 07/29/2020 1208   CHOLHDL 3.8 07/29/2020 1208   CHOLHDL 5.0 (H) 01/23/2020 1022   VLDL 47.0 (H) 11/28/2018 1048   LDLCALC 133 (H) 07/29/2020 1208   LDLCALC 170 (H) 01/23/2020 1022   LDLDIRECT 129.0 11/28/2018 1048      Wt Readings from Last 3 Encounters:  07/29/20 178 lb 3.2 oz (80.8 kg)  06/05/20 161 lb 12.8 oz (73.4 kg)  05/17/20 165 lb 5.5 oz (75 kg)      ASSESSMENT AND PLAN:  1   Atrial fibrillation    Pt found to be in atrial fibrillation   She did have COVID and was thyroid was subtlely off    She does have some SOB with activity   May be related to afib     She has not missed any Eliquis   Will therefore set up for cardioversion   2  HL  Lpids in AUg very high   WIll repeat  3  HTN  BP is OK   Follow  I will recomm f/u in cardiology in May 2022      CHeck:  CBC, BMET, lipids     Signed, Penny Pates, MD  07/29/2020 10:45 PM    [facility] [address], [place], [place]  [address] Phone: [phone]; Fax: [phone]    

## 2020-07-29 ENCOUNTER — Ambulatory Visit: Payer: Medicare Other | Admitting: Internal Medicine

## 2020-07-29 ENCOUNTER — Encounter: Payer: Self-pay | Admitting: Internal Medicine

## 2020-07-29 ENCOUNTER — Other Ambulatory Visit: Payer: Self-pay

## 2020-07-29 VITALS — BP 128/72 | HR 96 | Ht 64.0 in | Wt 178.2 lb

## 2020-07-29 DIAGNOSIS — Z79899 Other long term (current) drug therapy: Secondary | ICD-10-CM | POA: Diagnosis not present

## 2020-07-29 DIAGNOSIS — I4892 Unspecified atrial flutter: Secondary | ICD-10-CM | POA: Diagnosis not present

## 2020-07-29 LAB — CBC
Hematocrit: 36.4 % (ref 34.0–46.6)
Hemoglobin: 12.1 g/dL (ref 11.1–15.9)
MCH: 28.4 pg (ref 26.6–33.0)
MCHC: 33.2 g/dL (ref 31.5–35.7)
MCV: 85 fL (ref 79–97)
Platelets: 212 10*3/uL (ref 150–450)
RBC: 4.26 x10E6/uL (ref 3.77–5.28)
RDW: 14.8 % (ref 11.7–15.4)
WBC: 6.1 10*3/uL (ref 3.4–10.8)

## 2020-07-29 LAB — BASIC METABOLIC PANEL
BUN/Creatinine Ratio: 26 (ref 12–28)
BUN: 18 mg/dL (ref 8–27)
CO2: 27 mmol/L (ref 20–29)
Calcium: 9.6 mg/dL (ref 8.7–10.3)
Chloride: 98 mmol/L (ref 96–106)
Creatinine, Ser: 0.7 mg/dL (ref 0.57–1.00)
GFR calc Af Amer: 101 mL/min/{1.73_m2} (ref >59)
GFR calc non Af Amer: 87 mL/min/{1.73_m2} (ref >59)
Glucose: 103 mg/dL — ABNORMAL HIGH (ref 65–99)
Potassium: 3.9 mmol/L (ref 3.5–5.2)
Sodium: 140 mmol/L (ref 134–144)

## 2020-07-29 LAB — LIPID PANEL
Chol/HDL Ratio: 3.8 ratio (ref 0.0–4.4)
Cholesterol, Total: 223 mg/dL — ABNORMAL HIGH (ref 100–199)
HDL: 58 mg/dL (ref >39)
LDL Chol Calc (NIH): 133 mg/dL — ABNORMAL HIGH (ref 0–99)
Triglycerides: 182 mg/dL — ABNORMAL HIGH (ref 0–149)
VLDL Cholesterol Cal: 32 mg/dL (ref 5–40)

## 2020-07-29 NOTE — Patient Instructions (Signed)
Medication Instructions:  Your physician recommends that you continue on your current medications as directed. Please refer to the Current Medication list given to you today.  *If you need a refill on your cardiac medications before your next appointment, please call your pharmacy*   Lab Work: Bmet, cbc, lipid today  If you have labs (blood work) drawn today and your tests are completely normal, you will receive your results only by: Marland Kitchen MyChart Message (if you have MyChart) OR . A paper copy in the mail If you have any lab test that is abnormal or we need to change your treatment, we will call you to review the results.   Testing/Procedures: Your physician has recommended that you have a Cardioversion (DCCV). Electrical Cardioversion uses a jolt of electricity to your heart either through paddles or wired patches attached to your chest. This is a controlled, usually prescheduled, procedure. Defibrillation is done under light anesthesia in the hospital, and you usually go home the day of the procedure. This is done to get your heart back into a normal rhythm. You are not awake for the procedure. Please see the instruction sheet given to you today.     Follow-Up: At [facility], you and your health needs are our priority.  As part of our continuing mission to provide you with exceptional heart care, we have created designated Provider Care Teams.  These Care Teams include your primary Cardiologist (physician) and Advanced Practice Providers (APPs -  Physician Assistants and Nurse Practitioners) who all work together to provide you with the care you need, when you need it.  We recommend signing up for the patient portal called "MyChart".  Sign up information is provided on this After Visit Summary.  MyChart is used to connect with patients for Virtual Visits (Telemedicine).  Patients are able to view lab/test results, encounter notes, upcoming appointments, etc.  Non-urgent messages can be sent  to your provider as well.   To learn more about what you can do with MyChart, go to [email or web address].    Your next appointment:   3 month(s)  The format for your next appointment:   In Person  Provider:   Dietrich Pates, MD   Other Instructions

## 2020-07-31 ENCOUNTER — Telehealth: Payer: Self-pay

## 2020-07-31 NOTE — Telephone Encounter (Signed)
Per pt request... her cardioversion was changed to 08/06/20 at 8 am arrive 6:30 am with Covid test 08/03/20... updated letter sent to the pts My Chart.

## 2020-08-03 ENCOUNTER — Other Ambulatory Visit (HOSPITAL_COMMUNITY)
Admission: RE | Admit: 2020-08-03 | Discharge: 2020-08-03 | Disposition: A | Discharge status: Home / Self Care | Payer: Medicare Other | Source: Ambulatory Visit | Attending: Internal Medicine | Admitting: Internal Medicine

## 2020-08-03 DIAGNOSIS — Z01812 Encounter for preprocedural laboratory examination: Secondary | ICD-10-CM | POA: Insufficient documentation

## 2020-08-03 DIAGNOSIS — Z20822 Contact with and (suspected) exposure to covid-19: Secondary | ICD-10-CM | POA: Diagnosis not present

## 2020-08-03 LAB — SARS CORONAVIRUS 2 (TAT 6-24 HRS): SARS Coronavirus 2: NEGATIVE

## 2020-08-06 ENCOUNTER — Encounter (HOSPITAL_COMMUNITY)
Admission: RE | Disposition: A | Discharge status: Home / Self Care | Payer: Self-pay | Source: Home / Self Care | Attending: Internal Medicine

## 2020-08-06 ENCOUNTER — Ambulatory Visit (HOSPITAL_COMMUNITY)
Admission: RE | Admit: 2020-08-06 | Discharge: 2020-08-06 | Disposition: A | Discharge status: Home / Self Care | Payer: Medicare Other | Attending: Internal Medicine | Admitting: Internal Medicine

## 2020-08-06 ENCOUNTER — Encounter (HOSPITAL_COMMUNITY): Payer: Self-pay | Admitting: Internal Medicine

## 2020-08-06 ENCOUNTER — Other Ambulatory Visit: Payer: Self-pay

## 2020-08-06 ENCOUNTER — Ambulatory Visit (HOSPITAL_COMMUNITY): Payer: Medicare Other | Admitting: Certified Registered Nurse Anesthetist

## 2020-08-06 DIAGNOSIS — Z8616 Personal history of COVID-19: Secondary | ICD-10-CM | POA: Insufficient documentation

## 2020-08-06 DIAGNOSIS — E785 Hyperlipidemia, unspecified: Secondary | ICD-10-CM | POA: Diagnosis not present

## 2020-08-06 DIAGNOSIS — I429 Cardiomyopathy, unspecified: Secondary | ICD-10-CM | POA: Diagnosis not present

## 2020-08-06 DIAGNOSIS — I10 Essential (primary) hypertension: Secondary | ICD-10-CM | POA: Insufficient documentation

## 2020-08-06 DIAGNOSIS — Z87891 Personal history of nicotine dependence: Secondary | ICD-10-CM | POA: Diagnosis not present

## 2020-08-06 DIAGNOSIS — I4819 Other persistent atrial fibrillation: Secondary | ICD-10-CM

## 2020-08-06 DIAGNOSIS — Z880 Allergy status to penicillin: Secondary | ICD-10-CM | POA: Insufficient documentation

## 2020-08-06 DIAGNOSIS — I4892 Unspecified atrial flutter: Secondary | ICD-10-CM | POA: Diagnosis not present

## 2020-08-06 DIAGNOSIS — I4891 Unspecified atrial fibrillation: Secondary | ICD-10-CM | POA: Diagnosis not present

## 2020-08-06 HISTORY — PX: CARDIOVERSION: SHX1299

## 2020-08-06 SURGERY — CARDIOVERSION
Anesthesia: General

## 2020-08-06 MED ORDER — SODIUM CHLORIDE 0.9 % IV SOLN
INTRAVENOUS | Status: DC
  Administered 2020-08-06: 500 mL via INTRAVENOUS

## 2020-08-06 MED ORDER — PROPOFOL 10 MG/ML IV BOLUS
INTRAVENOUS | Status: DC | PRN
  Administered 2020-08-06: 60 mg via INTRAVENOUS

## 2020-08-06 MED ORDER — LIDOCAINE 2% (20 MG/ML) 5 ML SYRINGE
INTRAMUSCULAR | Status: DC | PRN
  Administered 2020-08-06: 40 mg via INTRAVENOUS

## 2020-08-06 NOTE — Anesthesia Procedure Notes (Signed)
Procedure Name: General with mask airway Date/Time: 08/06/2020 8:35 AM Performed by: Audie Pinto, CRNA Pre-anesthesia Checklist: Patient identified, Emergency Drugs available, Suction available and Patient being monitored Patient Re-evaluated:Patient Re-evaluated prior to induction Oxygen Delivery Method: Ambu bag Induction Type: IV induction Placement Confirmation: positive ETCO2 Dental Injury: Teeth and Oropharynx as per pre-operative assessment

## 2020-08-06 NOTE — Discharge Instructions (Signed)
Electrical Cardioversion Electrical cardioversion is the delivery of a jolt of electricity to restore a normal rhythm to the heart. A rhythm that is too fast or is not regular keeps the heart from pumping well. In this procedure, sticky patches or metal paddles are placed on the chest to deliver electricity to the heart from a device. This procedure may be done in an emergency if:  There is low or no blood pressure as a result of the heart rhythm.  Normal rhythm must be restored as fast as possible to protect the brain and heart from further damage.  It may save a life. This may also be a scheduled procedure for irregular or fast heart rhythms that are not immediately life-threatening. Tell a health care provider about:  Any allergies you have.  All medicines you are taking, including vitamins, herbs, eye drops, creams, and over-the-counter medicines.  Any problems you or family members have had with anesthetic medicines.  Any blood disorders you have.  Any surgeries you have had.  Any medical conditions you have.  Whether you are pregnant or may be pregnant. What are the risks? Generally, this is a safe procedure. However, problems may occur, including:  Allergic reactions to medicines.  A blood clot that breaks free and travels to other parts of your body.  The possible return of an abnormal heart rhythm within hours or days after the procedure.  Your heart stopping (cardiac arrest). This is rare. What happens before the procedure? Medicines  Your health care provider may have you start taking: ? Blood-thinning medicines (anticoagulants) so your blood does not clot as easily. ? Medicines to help stabilize your heart rate and rhythm.  Ask your health care provider about: ? Changing or stopping your regular medicines. This is especially important if you are taking diabetes medicines or blood thinners. ? Taking medicines such as aspirin and ibuprofen. These medicines can  thin your blood. Do not take these medicines unless your health care provider tells you to take them. ? Taking over-the-counter medicines, vitamins, herbs, and supplements. General instructions  Follow instructions from your health care provider about eating or drinking restrictions.  Plan to have someone take you home from the hospital or clinic.  If you will be going home right after the procedure, plan to have someone with you for 24 hours.  Ask your health care provider what steps will be taken to help prevent infection. These may include washing your skin with a germ-killing soap. What happens during the procedure?  An IV will be inserted into one of your veins.  Sticky patches (electrodes) or metal paddles may be placed on your chest.  You will be given a medicine to help you relax (sedative).  An electrical shock will be delivered. The procedure may vary among health care providers and hospitals.   What can I expect after the procedure?  Your blood pressure, heart rate, breathing rate, and blood oxygen level will be monitored until you leave the hospital or clinic.  Your heart rhythm will be watched to make sure it does not change.  You may have some redness on the skin where the shocks were given. Follow these instructions at home:  Do not drive for 24 hours if you were given a sedative during your procedure.  Take over-the-counter and prescription medicines only as told by your health care provider.  Ask your health care provider how to check your pulse. Check it often.  Rest for 48 hours after the procedure   or as told by your health care provider.  Avoid or limit your caffeine use as told by your health care provider.  Keep all follow-up visits as told by your health care provider. This is important. Contact a health care provider if:  You feel like your heart is beating too quickly or your pulse is not regular.  You have a serious muscle cramp that does not go  away. Get help right away if:  You have discomfort in your chest.  You are dizzy or you feel faint.  You have trouble breathing or you are short of breath.  Your speech is slurred.  You have trouble moving an arm or leg on one side of your body.  Your fingers or toes turn cold or blue. Summary  Electrical cardioversion is the delivery of a jolt of electricity to restore a normal rhythm to the heart.  This procedure may be done right away in an emergency or may be a scheduled procedure if the condition is not an emergency.  Generally, this is a safe procedure.  After the procedure, check your pulse often as told by your health care provider. This information is not intended to replace advice given to you by your health care provider. Make sure you discuss any questions you have with your health care provider. Document Revised: 01/02/2019 Document Reviewed: 01/02/2019 Elsevier Patient Education  2021 [employer]  

## 2020-08-06 NOTE — Interval H&P Note (Signed)
History and Physical Interval Note:  08/06/2020 8:07 AM  Penny Barnett  has presented today for surgery, with the diagnosis of AFIB.  The various methods of treatment have been discussed with the patient and family. After consideration of risks, benefits and other options for treatment, the patient has consented to  Procedure(s): CARDIOVERSION (N/A) as a surgical intervention.  The patient's history has been reviewed, patient examined, no change in status, stable for surgery.  I have reviewed the patient's chart and labs.  Questions were answered to the patient's satisfaction.     [name]

## 2020-08-06 NOTE — CV Procedure (Signed)
   CARDIOVERSION NOTE  Procedure: Electrical Cardioversion Indications:  Atrial Fibrillation  Procedure Details:  Consent: Risks of procedure as well as the alternatives and risks of each were explained to the (patient/caregiver).  Consent for procedure obtained.  Time Out: Verified patient identification, verified procedure, site/side was marked, verified correct patient position, special equipment/implants available, medications/allergies/relevent history reviewed, required imaging and test results available.  Performed  Patient placed on cardiac monitor, pulse oximetry, supplemental oxygen as necessary.  Sedation given: propofol per anesthesia Pacer pads placed anterior and posterior chest.  Cardioverted 1 time(s).  Cardioverted at 150J biphasic.  Impression: Findings: Post procedure EKG shows: NSR Complications: None - patient noted to have skin redness under the defib pad and electrodes, suspect adhesive allergy Patient did tolerate procedure well.  Plan: 1. Successful DCCV with a single 150J Biphasic shock to NSR with occasional PVC's and PAC's. 2. Can apply topical 1% hydrocortisone to affected skin areas.  Time Spent Directly with the Patient:  30 minutes   [name], MD, [facility], FACP  [place]  [facility]  Medical Director of the [facility] Diplomate of the American Board of Clinical Lipidology Attending Cardiologist  Direct Dial: [phone]  Fax: [phone]  Website:  [email or web address] 08/06/2020, 8:49 AM

## 2020-08-06 NOTE — Anesthesia Postprocedure Evaluation (Signed)
Anesthesia Post Note  Patient: Penny Barnett  Procedure(s) Performed: CARDIOVERSION (N/A )     Patient location during evaluation: PACU Anesthesia Type: General Level of consciousness: awake and alert Pain management: pain level controlled Vital Signs Assessment: post-procedure vital signs reviewed and stable Respiratory status: spontaneous breathing, nonlabored ventilation, respiratory function stable and patient connected to nasal cannula oxygen Cardiovascular status: blood pressure returned to baseline and stable Postop Assessment: no apparent nausea or vomiting Anesthetic complications: no   No complications documented.  Last Vitals:  Vitals:   08/06/20 0900 08/06/20 0910  BP: 97/71 (!) 96/52  Pulse: (!) 48 (!) 54  Resp: 16 17  Temp:    SpO2: 95% 95%    Last Pain:  Vitals:   08/06/20 0910  TempSrc:   PainSc: 0-No pain                 Shelton Silvas

## 2020-08-06 NOTE — Anesthesia Preprocedure Evaluation (Addendum)
Anesthesia Evaluation  Patient identified by MRN, date of birth, ID band Patient awake    Reviewed: Allergy & Precautions, NPO status , Patient's Chart, lab work & pertinent test results  Airway Mallampati: II  TM Distance: >3 FB Neck ROM: Full    Dental  (+) Teeth Intact, Dental Advisory Given   Pulmonary asthma , former smoker,    breath sounds clear to auscultation       Cardiovascular hypertension, + dysrhythmias Atrial Fibrillation  Rhythm:Irregular Rate:Abnormal     Neuro/Psych negative neurological ROS  negative psych ROS   GI/Hepatic Neg liver ROS, GERD  Medicated,  Endo/Other  negative endocrine ROS  Renal/GU      Musculoskeletal  (+) Arthritis ,   Abdominal Normal abdominal exam  (+)   Peds  Hematology negative hematology ROS (+)   Anesthesia Other Findings - HLD  Reproductive/Obstetrics                           Anesthesia Physical Anesthesia Plan  ASA: III  Anesthesia Plan: General   Post-op Pain Management:    Induction: Intravenous  PONV Risk Score and Plan: 0 and Propofol infusion  Airway Management Planned: Natural Airway and Simple Face Mask  Additional Equipment: None  Intra-op Plan:   Post-operative Plan:   Informed Consent: I have reviewed the patients History and Physical, chart, labs and discussed the procedure including the risks, benefits and alternatives for the proposed anesthesia with the patient or authorized representative who has indicated his/her understanding and acceptance.       Plan Discussed with: CRNA  Anesthesia Plan Comments: (Echo:  1. Left ventricular ejection fraction, by estimation, is 60 to 65%. The  left ventricle has normal function. Left ventricular diastolic function  could not be evaluated.  2. Right ventricular systolic function is normal. The right ventricular  size is normal. There is normal pulmonary artery  systolic pressure.  3. The mitral valve is grossly normal. Mild mitral valve regurgitation.  4. The aortic valve is normal in structure. Aortic valve regurgitation is  not visualized. No aortic stenosis is present. )       Anesthesia Quick Evaluation

## 2020-08-06 NOTE — Transfer of Care (Signed)
Immediate Anesthesia Transfer of Care Note  Patient: Penny Barnett  Procedure(s) Performed: CARDIOVERSION (N/A )  Patient Location: Endoscopy Unit  Anesthesia Type:General  Level of Consciousness: drowsy and patient cooperative  Airway & Oxygen Therapy: Patient Spontanous Breathing and Patient connected to nasal cannula oxygen  Post-op Assessment: Report given to RN and Post -op Vital signs reviewed and stable  Post vital signs: Reviewed  Last Vitals:  Vitals Value Taken Time  BP    Temp    Pulse    Resp    SpO2      Last Pain:  Vitals:   08/06/20 0741  TempSrc: Oral  PainSc: 0-No pain         Complications: No complications documented.

## 2020-08-07 ENCOUNTER — Encounter (HOSPITAL_COMMUNITY): Payer: Self-pay | Admitting: Internal Medicine

## 2020-08-10 ENCOUNTER — Other Ambulatory Visit (HOSPITAL_COMMUNITY): Payer: Medicare Other

## 2020-08-23 NOTE — Progress Notes (Signed)
Subjective:   Penny Barnett is a 70 y.o. female who presents for Medicare Annual (Subsequent) preventive examination.  Review of Systems    N/A  Cardiac Risk Factors include: advanced age (>6055men, 38>65 women)     Objective:    Today's Vitals   08/26/20 1015 08/26/20 1023  BP: 108/70   Pulse: 63   Temp: 98 F (36.7 C)   TempSrc: Oral   SpO2: 96%   Weight: 174 lb 1 oz (79 kg)   Height: 5\' 4"  (1.626 m)   PainSc:  3    Body mass index is 29.88 kg/m.  Advanced Directives 08/26/2020 08/06/2020 05/17/2020 05/16/2020 12/29/2017 12/15/2016 04/22/2016  Does Patient Have a Medical Advance Directive? No No No No Yes No No  Does patient want to make changes to medical advance directive? Yes (MAU/Ambulatory/Procedural Areas - Information given) - - - - - -  Would patient like information on creating a medical advance directive? - No - Patient declined No - Patient declined - - No - Patient declined -    Current Medications (verified) Outpatient Encounter Medications as of 08/26/2020  Medication Sig  . albuterol (VENTOLIN HFA) 108 (90 Base) MCG/ACT inhaler INHALE 2 PUFFS INTO THE LUNGS EVERY 6 HOURS AS NEEDED FOR WHEEZING OR SHORTNESS OF BREATH (Patient taking differently: Inhale 2 puffs into the lungs every 6 (six) hours as needed for wheezing or shortness of breath.)  . apixaban (ELIQUIS) 5 MG TABS tablet Take 1 tablet (5 mg total) by mouth 2 (two) times daily.  . bisoprolol (ZEBETA) 5 MG tablet Take 1 tablet (5 mg total) by mouth daily.  . Cholecalciferol (VITAMIN D-3) 125 MCG (5000 UT) TABS Take 5,000 Units by mouth daily.  . Coenzyme Q10 (CO Q 10) 100 MG CAPS Take 100 mg by mouth daily.  . diclofenac Sodium (VOLTAREN) 1 % GEL Apply 2 g topically daily as needed (Pain).  . fluticasone (FLOVENT HFA) 110 MCG/ACT inhaler Inhale 1-2 puffs into the lungs 2 (two) times daily. (Patient taking differently: Inhale 1 puff into the lungs 2 (two) times daily.)  . KLOR-CON M20 20 MEQ tablet TAKE 1  TABLET BY MOUTH EVERY DAY (Patient taking differently: Take 20 mEq by mouth daily.)  . montelukast (SINGULAIR) 10 MG tablet TAKE 1 TABLET BY MOUTH EVERY DAY IN THE MORNING (Patient taking differently: Take 10 mg by mouth daily.)  . Multiple Vitamins-Minerals (MULTIVITAMIN WITH MINERALS) tablet Take 1 tablet by mouth daily. With Iron  . omeprazole (PRILOSEC) 20 MG capsule Take 20 mg by mouth daily.  . Probiotic Product (PROBIOTIC DAILY PO) Take 1 capsule by mouth daily.  . rosuvastatin (CRESTOR) 5 MG tablet Take 5 mg by mouth daily.   Marland Kitchen. triamterene-hydrochlorothiazide (MAXZIDE-25) 37.5-25 MG tablet Take 0.5 tablets by mouth daily.  . vitamin C (ASCORBIC ACID) 500 MG tablet Take 500 mg by mouth daily.  Marland Kitchen. zinc gluconate 50 MG tablet Take 50 mg by mouth daily.  . cyclobenzaprine (FLEXERIL) 5 MG tablet Take 1 tablet (5 mg total) by mouth 3 (three) times daily as needed for muscle spasms. (Patient not taking: Reported on 08/26/2020)  . [DISCONTINUED] Potassium 99 MG TABS Take 99 mg by mouth daily.   No facility-administered encounter medications on file as of 08/26/2020.    Allergies (verified) Penicillins   History: Past Medical History:  Diagnosis Date  . Allergy   . Anemia   . Arthritis    hands  . Asthma    as a child and  then attack 1996  poss allergic to cat hair. ventilated x 2 . saw Dr [place] Callas then .   Marland Kitchen Atrial flutter (HCC)    a. 12/2014 Echo: EF 60-65%, no rwma, mild MR;  b. S/P TEE/DCCV;  c. CHA2DS2VASc = 3-->Eliquis.  . Cataract    Had surgery to remove  . GERD (gastroesophageal reflux disease)    2000 taking otc prevacid   evey day.   . Hyperlipidemia   . Hypertension     3 meds controlled  . Kidney stone    passed stone - no surgery required   Past Surgical History:  Procedure Laterality Date  . CARDIOVERSION N/A 01/03/2015   Procedure: CARDIOVERSION;  Surgeon: Pricilla Riffle, MD;  Location: [facility] ENDOSCOPY;  Service: Cardiovascular;  Laterality: N/A;  . CARDIOVERSION N/A  08/06/2020   Procedure: CARDIOVERSION;  Surgeon: [name], MD;  Location: [facility] ENDOSCOPY;  Service: Cardiovascular;  Laterality: N/A;  . CATARACT EXTRACTION Bilateral 10/14/2010   both eyes  . COLONOSCOPY  02/2011   Arlyce Dice  . SHOULDER SURGERY Bilateral    x 2 - frozen shoulder rt 2006, 2000 left   . TEE WITHOUT CARDIOVERSION N/A 01/03/2015   Procedure: TRANSESOPHAGEAL ECHOCARDIOGRAM (TEE);  Surgeon: Pricilla Riffle, MD;  Location: [facility] ENDOSCOPY;  Service: Cardiovascular;  Laterality: N/A;  . TONSILLECTOMY     Family History  Problem Relation Age of Onset  . Breast cancer Mother   . Bladder Cancer Mother   . Uterine cancer Sister   . Alcohol abuse Father   . Breast cancer Maternal Grandmother   . Colon cancer Neg Hx   . Heart attack Neg Hx   . Stroke Neg Hx   . Hypertension Neg Hx   . Esophageal cancer Neg Hx   . Rectal cancer Neg Hx   . Stomach cancer Neg Hx    Social History   Socioeconomic History  . Marital status: Single    Spouse name: Not on file  . Number of children: Not on file  . Years of education: Not on file  . Highest education level: Not on file  Occupational History  . Occupation: CSR  Tobacco Use  . Smoking status: Former Smoker    Packs/day: 1.00    Years: 20.00    Pack years: 20.00    Types: Cigarettes    Quit date: 06/21/1984    Years since quitting: 36.2  . Smokeless tobacco: Never Used  Vaping Use  . Vaping Use: Never used  Substance and Sexual Activity  . Alcohol use: No    Alcohol/week: 0.0 standard drinks  . Drug use: No  . Sexual activity: Not on file  Other Topics Concern  . 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    HS education  single   6 hours of sleep   Hh  Of 1  Pet dog and a horse.    Remote tobacco  1-2 ppd stopped 1988    Etoh couple times a month rare    ocass  Diet soda.              Social Determinants of Health   Financial Resource Strain: Low Risk   . Difficulty of Paying Living Expenses: Not hard at all  Food Insecurity: No Food Insecurity  . Worried About Programme researcher, broadcasting/film/video in the Last Year: Never true  . Ran Out of Food in the Last Year: Never true  Transportation Needs: No Transportation Needs  . Lack of Transportation (Medical): No  . Lack of Transportation (Non-Medical): No  Physical Activity: Insufficiently Active  . Days of Exercise per Week: 2 days  . Minutes of Exercise per Session: 50 min  Stress: No Stress Concern Present  . Feeling of Stress : Not at all  Social Connections: Moderately Isolated  . Frequency of Communication with Friends and Family: More than three times a week  . Frequency of Social Gatherings with Friends and Family: More than three times a week  . Attends Religious Services: 1 to 4 times per year  . Active Member of Clubs or Organizations: No  . Attends Banker Meetings: Never  . Marital Status: Never married    Tobacco Counseling Counseling given: Not Answered   Clinical Intake:  Pre-visit preparation completed: Yes  Pain : 0-10 Pain Score: 3  Pain Type: Chronic pain Pain Location: Knee Pain Orientation: Right,Left Pain Descriptors / Indicators: Aching Pain Onset: More than a month ago Pain Frequency: Intermittent Pain Relieving Factors: knee brace  Pain Relieving Factors: knee brace  Nutritional Risks: None Diabetes: No  How often do you need to have someone help you when you read instructions, pamphlets, or other written materials from your doctor or pharmacy?: 1 - Never  Diabetic?No  Interpreter Needed?: No  Information entered by :: SCrews,LPN   Activities of Daily Living In your present state of health, do you have any difficulty performing the following activities: 08/26/2020 05/18/2020  Hearing? N N  Vision? N N  Difficulty concentrating or making decisions? N N   Walking or climbing stairs? N N  Dressing or bathing? N N  Doing errands, shopping? N N  Preparing Food and eating ? N -  Using the Toilet? N -  In the past six months, have you accidently leaked urine? N -  Do you have problems with loss of bowel control? N -  Managing your Medications? N -  Managing your Finances? N -  Housekeeping or managing your Housekeeping? N -  Some recent data might be hidden    Patient Care Team: Panosh, Neta Mends, MD as PCP - General (Internal Medicine) Pricilla Riffle, MD as PCP - Cardiology (Cardiology) Nita Sells, MD (Dermatology) Myrtie Neither Andreas Blower, MD as Consulting Physician (Gastroenterology)  Indicate any recent Medical Services you may have received from other than [facility] providers in the past year (date may be approximate).     Assessment:   This is a routine wellness examination for Penny Barnett.  Hearing/Vision screen  Hearing Screening   125Hz  250Hz  500Hz  1000Hz  2000Hz  3000Hz  4000Hz  6000Hz  8000Hz   Right ear:           Left ear:           Vision Screening Comments: Has not had eye exam in a few years. Has hx of cataract surgery in 2012  Dietary issues and exercise activities discussed: Current Exercise Habits: Home exercise routine, Type of exercise: walking, Time (Minutes): 45, Frequency (Times/Week): 2, Weekly Exercise (Minutes/Week): 90, Intensity: Mild  Goals    . Lose 25 lbs.     Goal is 5 lb increments.     . Patient Stated     Will maintain this year  Would like to lose a few more weight  Would like to get down below 150; Does not eat bread, potatoes etc Diet mayo       Depression Screen PHQ 2/9 Scores 08/26/2020 01/23/2020 11/28/2018 12/29/2017 12/15/2016 11/18/2016 10/25/2015  PHQ - 2 Score 0 0 0 0 0 0 0  PHQ- 9 Score - 0 - - - - -    Fall Risk Fall Risk  08/26/2020 01/23/2020 12/29/2017 12/15/2016 11/18/2016  Falls in the past year? 0 0 No No No  Number falls in past yr: 0 - - - -  Injury with Fall? 0 - - - -  Risk for fall due to : No  Fall Risks - - - -  Follow up Falls evaluation completed;Falls prevention discussed - - - -    FALL RISK PREVENTION PERTAINING TO THE HOME:  Any stairs in or around the home? No  If so, are there any without handrails? No  Home free of loose throw rugs in walkways, pet beds, electrical cords, etc? Yes  Adequate lighting in your home to reduce risk of falls? Yes   ASSISTIVE DEVICES UTILIZED TO PREVENT FALLS:  Life alert? No  Use of a cane, walker or w/c? No  Grab bars in the bathroom? No  Shower chair or bench in shower? No  Elevated toilet seat or a handicapped toilet? No   TIMED UP AND GO:  Was the test performed? Yes .  Length of time to ambulate 10 feet: 3 sec.   Gait steady and fast without use of assistive device  Cognitive Function: Normal cognitive status assessed by direct observation by this Nurse Health Advisor. No abnormalities found.   MMSE - Mini Mental State Exam 12/29/2017  Not completed: (No Data)        Immunizations Immunization History  Administered Date(s) Administered  . Fluad Quad(high Dose 65+) 03/11/2019  . Influenza Split 04/13/2011  . Influenza, High Dose Seasonal PF 02/12/2015, 04/05/2018  . Influenza,inj,Quad PF,6+ Mos 03/29/2017  . Pneumococcal Conjugate-13 10/16/2013, 10/23/2014  . Pneumococcal Polysaccharide-23 10/25/2015    TDAP status: Due, Education has been provided regarding the importance of this vaccine. Advised may receive this vaccine at local pharmacy or Health Dept. Aware to provide a copy of the vaccination record if obtained from local pharmacy or Health Dept. Verbalized acceptance and understanding.  Flu Vaccine status: Declined, Education has been provided regarding the importance of this vaccine but patient still declined. Advised may receive this vaccine at local pharmacy or Health Dept. Aware to provide a copy of the vaccination record if obtained from local pharmacy or Health Dept. Verbalized acceptance and  understanding.  Pneumococcal vaccine status: Up to date  Covid-19 vaccine status: Completed vaccines  Qualifies for Shingles Vaccine? Yes   Zostavax completed No   Shingrix Completed?: No.    Education has been provided regarding the importance of this vaccine. Patient has been advised to call insurance company to determine out of pocket expense if they have not yet received this vaccine. Advised may also receive vaccine at local pharmacy or Health Dept. Verbalized acceptance and understanding.  Screening Tests Health Maintenance  Topic Date Due  . COVID-19 Vaccine (1) Never done  . TETANUS/TDAP  06/15/2018  . INFLUENZA VACCINE  01/14/2020  . MAMMOGRAM  11/07/2020  . COLONOSCOPY (Pts 45-3yrs Insurance coverage will need to be confirmed)  04/27/2026  . DEXA SCAN  Completed  . Hepatitis C Screening  Completed  . PNA vac Low Risk Adult  Completed  . HPV VACCINES  Aged Out  Health Maintenance  Health Maintenance Due  Topic Date Due  . COVID-19 Vaccine (1) Never done  . TETANUS/TDAP  06/15/2018  . INFLUENZA VACCINE  01/14/2020    Colorectal cancer screening: Type of screening: Colonoscopy. Completed 04/27/2016. Repeat every 10 years  Mammogram status: Completed 11/08/2019. Repeat every year  Bone Density status: Completed 02/16/2017. Results reflect: Bone density results: NORMAL. Repeat every 5 years.  Lung Cancer Screening: (Low Dose CT Chest recommended if Age 58-80 years, 30 pack-year currently smoking OR have quit w/in 15years.) does not qualify.   Lung Cancer Screening Referral: N/A   Additional Screening:  Hepatitis C Screening: does qualify; Completed 12/02/2012  Vision Screening: Recommended annual ophthalmology exams for early detection of glaucoma and other disorders of the eye. Is the patient up to date with their annual eye exam?  No  Who is the provider or what is the name of the office in which the patient attends annual eye exams? N/A  If pt is not  established with a provider, would they like to be referred to a provider to establish care? Yes .   Dental Screening: Recommended annual dental exams for proper oral hygiene  Community Resource Referral / Chronic Care Management: CRR required this visit?  No   CCM required this visit?  No      Plan:     I have personally reviewed and noted the following in the patient's chart:   . Medical and social history . Use of alcohol, tobacco or illicit drugs  . Current medications and supplements . Functional ability and status . Nutritional status . Physical activity . Advanced directives . List of other physicians . Hospitalizations, surgeries, and ER visits in previous 12 months . Vitals . Screenings to include cognitive, depression, and falls . Referrals and appointments  In addition, I have reviewed and discussed with patient certain preventive protocols, quality metrics, and best practice recommendations. A written personalized care plan for preventive services as well as general preventive health recommendations were provided to patient.     Theodora Blow, LPN   [id number]   Nurse Notes: None

## 2020-08-26 ENCOUNTER — Other Ambulatory Visit: Payer: Self-pay

## 2020-08-26 ENCOUNTER — Ambulatory Visit (INDEPENDENT_AMBULATORY_CARE_PROVIDER_SITE_OTHER): Payer: Medicare Other

## 2020-08-26 VITALS — BP 108/70 | HR 63 | Temp 98.0°F | Ht 64.0 in | Wt 174.1 lb

## 2020-08-26 DIAGNOSIS — Z0100 Encounter for examination of eyes and vision without abnormal findings: Secondary | ICD-10-CM | POA: Diagnosis not present

## 2020-08-26 DIAGNOSIS — Z0000 Encounter for general adult medical examination without abnormal findings: Secondary | ICD-10-CM

## 2020-08-26 NOTE — Patient Instructions (Signed)
Penny Barnett , Thank you for taking time to come for your Medicare Wellness Visit. I appreciate your ongoing commitment to your health goals. Please review the following plan we discussed and let me know if I can assist you in the future.   Screening recommendations/referrals: Colonoscopy: Up to date, next due 04/27/2026 Mammogram: Up to date, next due 11/07/2020  Bone Density: No longer required  Recommended yearly ophthalmology/optometry visit for glaucoma screening and checkup Recommended yearly dental visit for hygiene and checkup  Vaccinations: Influenza vaccine: Currently due, if you would like to receive you may do so at your local pharmacy or schedule and appointment with [place]. Pneumococcal vaccine: Completed series  Tdap vaccine: Currently due, you may await and injury to receive  Shingles vaccine: Currently due for Shingrix, if you would like to receive we recommend that you do so at your local pharmacy.    Advanced directives: Advance directive discussed with you today. Even though you declined this today please call our office should you change your mind and we can give you the proper paperwork for you to fill out.   Conditions/risks identified: None   Next appointment: 08/27/2021 @ 10:30 am with Nurse Health Advisor    Preventive Care 65 Years and Older, Female Preventive care refers to lifestyle choices and visits with your health care provider that can promote health and wellness. What does preventive care include?  A yearly physical exam. This is also called an annual well check.  Dental exams once or twice a year.  Routine eye exams. Ask your health care provider how often you should have your eyes checked.  Personal lifestyle choices, including:  Daily care of your teeth and gums.  Regular physical activity.  Eating a healthy diet.  Avoiding tobacco and drug use.  Limiting alcohol use.  Practicing safe sex.  Taking low-dose aspirin every day.  Taking  vitamin and mineral supplements as recommended by your health care provider. What happens during an annual well check? The services and screenings done by your health care provider during your annual well check will depend on your age, overall health, lifestyle risk factors, and family history of disease. Counseling  Your health care provider may ask you questions about your:  Alcohol use.  Tobacco use.  Drug use.  Emotional well-being.  Home and relationship well-being.  Sexual activity.  Eating habits.  History of falls.  Memory and ability to understand (cognition).  Work and work Astronomer.  Reproductive health. Screening  You may have the following tests or measurements:  Height, weight, and BMI.  Blood pressure.  Lipid and cholesterol levels. These may be checked every 5 years, or more frequently if you are over 18 years old.  Skin check.  Lung cancer screening. You may have this screening every year starting at age 47 if you have a 30-pack-year history of smoking and currently smoke or have quit within the past 15 years.  Fecal occult blood test (FOBT) of the stool. You may have this test every year starting at age 50.  Flexible sigmoidoscopy or colonoscopy. You may have a sigmoidoscopy every 5 years or a colonoscopy every 10 years starting at age 62.  Hepatitis C blood test.  Hepatitis B blood test.  Sexually transmitted disease (STD) testing.  Diabetes screening. This is done by checking your blood sugar (glucose) after you have not eaten for a while (fasting). You may have this done every 1-3 years.  Bone density scan. This is done to screen for  osteoporosis. You may have this done starting at age 35.  Mammogram. This may be done every 1-2 years. Talk to your health care provider about how often you should have regular mammograms. Talk with your health care provider about your test results, treatment options, and if necessary, the need for more  tests. Vaccines  Your health care provider may recommend certain vaccines, such as:  Influenza vaccine. This is recommended every year.  Tetanus, diphtheria, and acellular pertussis (Tdap, Td) vaccine. You may need a Td booster every 10 years.  Zoster vaccine. You may need this after age 85.  Pneumococcal 13-valent conjugate (PCV13) vaccine. One dose is recommended after age 47.  Pneumococcal polysaccharide (PPSV23) vaccine. One dose is recommended after age 27. Talk to your health care provider about which screenings and vaccines you need and how often you need them. This information is not intended to replace advice given to you by your health care provider. Make sure you discuss any questions you have with your health care provider. Document Released: 06/28/2015 Document Revised: 02/19/2016 Document Reviewed: 04/02/2015 Elsevier Interactive Patient Education  2017 [place] Prevention in the Home Falls can cause injuries. They can happen to people of all ages. There are many things you can do to make your home safe and to help prevent falls. What can I do on the outside of my home?  Regularly fix the edges of walkways and driveways and fix any cracks.  Remove anything that might make you trip as you walk through a door, such as a raised step or threshold.  Trim any bushes or trees on the path to your home.  Use bright outdoor lighting.  Clear any walking paths of anything that might make someone trip, such as rocks or tools.  Regularly check to see if handrails are loose or broken. Make sure that both sides of any steps have handrails.  Any raised decks and porches should have guardrails on the edges.  Have any leaves, snow, or ice cleared regularly.  Use sand or salt on walking paths during winter.  Clean up any spills in your garage right away. This includes oil or grease spills. What can I do in the bathroom?  Use night lights.  Install grab bars by the  toilet and in the tub and shower. Do not use towel bars as grab bars.  Use non-skid mats or decals in the tub or shower.  If you need to sit down in the shower, use a plastic, non-slip stool.  Keep the floor dry. Clean up any water that spills on the floor as soon as it happens.  Remove soap buildup in the tub or shower regularly.  Attach bath mats securely with double-sided non-slip rug tape.  Do not have throw rugs and other things on the floor that can make you trip. What can I do in the bedroom?  Use night lights.  Make sure that you have a light by your bed that is easy to reach.  Do not use any sheets or blankets that are too big for your bed. They should not hang down onto the floor.  Have a firm chair that has side arms. You can use this for support while you get dressed.  Do not have throw rugs and other things on the floor that can make you trip. What can I do in the kitchen?  Clean up any spills right away.  Avoid walking on wet floors.  Keep items that you use  a lot in easy-to-reach places.  If you need to reach something above you, use a strong step stool that has a grab bar.  Keep electrical cords out of the way.  Do not use floor polish or wax that makes floors slippery. If you must use wax, use non-skid floor wax.  Do not have throw rugs and other things on the floor that can make you trip. What can I do with my stairs?  Do not leave any items on the stairs.  Make sure that there are handrails on both sides of the stairs and use them. Fix handrails that are broken or loose. Make sure that handrails are as long as the stairways.  Check any carpeting to make sure that it is firmly attached to the stairs. Fix any carpet that is loose or worn.  Avoid having throw rugs at the top or bottom of the stairs. If you do have throw rugs, attach them to the floor with carpet tape.  Make sure that you have a light switch at the top of the stairs and the bottom of  the stairs. If you do not have them, ask someone to add them for you. What else can I do to help prevent falls?  Wear shoes that:  Do not have high heels.  Have rubber bottoms.  Are comfortable and fit you well.  Are closed at the toe. Do not wear sandals.  If you use a stepladder:  Make sure that it is fully opened. Do not climb a closed stepladder.  Make sure that both sides of the stepladder are locked into place.  Ask someone to hold it for you, if possible.  Clearly mark and make sure that you can see:  Any grab bars or handrails.  First and last steps.  Where the edge of each step is.  Use tools that help you move around (mobility aids) if they are needed. These include:  Canes.  Walkers.  Scooters.  Crutches.  Turn on the lights when you go into a dark area. Replace any light bulbs as soon as they burn out.  Set up your furniture so you have a clear path. Avoid moving your furniture around.  If any of your floors are uneven, fix them.  If there are any pets around you, be aware of where they are.  Review your medicines with your doctor. Some medicines can make you feel dizzy. This can increase your chance of falling. Ask your doctor what other things that you can do to help prevent falls. This information is not intended to replace advice given to you by your health care provider. Make sure you discuss any questions you have with your health care provider. Document Released: 03/28/2009 Document Revised: 11/07/2015 Document Reviewed: 07/06/2014 Elsevier Interactive Patient Education  2017 [employer].

## 2020-08-26 NOTE — Addendum Note (Signed)
Addended by: Jeralene Peters on: 08/26/2020 03:25 PM   Modules accepted: Orders

## 2020-08-29 ENCOUNTER — Other Ambulatory Visit: Payer: Self-pay | Admitting: Internal Medicine

## 2020-08-30 ENCOUNTER — Other Ambulatory Visit: Payer: Self-pay | Admitting: Internal Medicine

## 2020-09-01 ENCOUNTER — Other Ambulatory Visit: Payer: Self-pay | Admitting: Internal Medicine

## 2020-09-07 ENCOUNTER — Other Ambulatory Visit: Payer: Self-pay | Admitting: Internal Medicine

## 2020-09-28 ENCOUNTER — Other Ambulatory Visit: Payer: Self-pay | Admitting: Internal Medicine

## 2020-09-30 ENCOUNTER — Other Ambulatory Visit: Payer: Self-pay | Admitting: Internal Medicine

## 2020-09-30 NOTE — Telephone Encounter (Signed)
Eliquis 5mg  refill request received. Patient is 70 years old, weight-79kg, Crea-0.70 on 07/29/20, Diagnosis-Afib, and last seen by Dr. 07/31/20 on 07/29/20. Dose is appropriate based on dosing criteria. Will send in refill to requested pharmacy.

## 2020-10-01 NOTE — Progress Notes (Signed)
Chief Complaint  Patient presents with  . Shortness of Breath    Patient complains of shortness of breath when performing exercise    HPI: Penny Barnett 70 y.o. come in for DOE  See  My chart message  Hx covid pna in December has not been particularly back to baseline since then but had been doing okay until having episode of atrial flutter that required cardioversion February 22.  She is on anticoagulation with no excess bruising is on a beta-blocker still.  Would like at some point to be able to get off some of these medicines but most recently  And then  2-3 weeks ago may be having some palpitations high rate and low rate Last   Days weeks  And noted  Out of breath and when sits minutes to go away .Marland Kitchen  And not right  But   Sometimes at 45 and then 152 notices when walking a bit but not always. In addition she tends to get swelling in her feet at the end of the day but in the last 4 days it does not tend to go down despite rest.   No fever cough . no syncope . Sleep ok  No new supplements except her vitamins.  ROS: See pertinent positives and negatives per HPI.  Weight is fluctuated off and on down after COVID with taste and smell reduction and then increased decreased again and increased.  Past Medical History:  Diagnosis Date  . Allergy   . Anemia   . Arthritis    hands  . Asthma    as a child and then attack 1996  poss allergic to cat hair. ventilated x 2 . saw Dr Donald Callas then .   Marland Kitchen Atrial flutter (HCC)    a. 12/2014 Echo: EF 60-65%, no rwma, mild MR;  b. S/P TEE/DCCV;  c. CHA2DS2VASc = 3-->Eliquis.  . Cataract    Had surgery to remove  . GERD (gastroesophageal reflux disease)    2000 taking otc prevacid   evey day.   . Hyperlipidemia   . Hypertension     3 meds controlled  . Kidney stone    passed stone - no surgery required    Family History  Problem Relation Age of Onset  . Breast cancer Mother   . Bladder Cancer Mother   . Uterine cancer Sister   . Alcohol  abuse Father   . Breast cancer Maternal Grandmother   . Colon cancer Neg Hx   . Heart attack Neg Hx   . Stroke Neg Hx   . Hypertension Neg Hx   . Esophageal cancer Neg Hx   . Rectal cancer Neg Hx   . Stomach cancer Neg Hx     Social History   Socioeconomic History  . Marital status: Single    Spouse name: Not on file  . Number of children: Not on file  . Years of education: Not on file  . Highest education level: Not on file  Occupational History  . Occupation: CSR  Tobacco Use  . Smoking status: Former Smoker    Packs/day: 1.00    Years: 20.00    Pack years: 20.00    Types: Cigarettes    Quit date: 06/21/1984    Years since quitting: 36.3  . Smokeless tobacco: Never Used  Vaping Use  . Vaping Use: Never used  Substance and Sexual Activity  . Alcohol use: No    Alcohol/week: 0.0 standard drinks  . Drug use:  No  . Sexual activity: Not on file  Other Topics Concern  . 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    HS education  single   6 hours of sleep   Hh  Of 1  Pet dog and a horse.    Remote tobacco  1-2 ppd stopped 1988    Etoh couple times a month rare    ocass  Diet soda.             Social Determinants of Health   Financial Resource Strain: Low Risk   . Difficulty of Paying Living Expenses: Not hard at all  Food Insecurity: No Food Insecurity  . Worried About Programme researcher, broadcasting/film/video in the Last Year: Never true  . Ran Out of Food in the Last Year: Never true  Transportation Needs: No Transportation Needs  . Lack of Transportation (Medical): No  . Lack of Transportation (Non-Medical): No  Physical Activity: Insufficiently Active  . Days of Exercise per Week: 2 days  . Minutes of Exercise per Session: 50 min  Stress: No Stress Concern Present  . Feeling of Stress : Not at all  Social Connections: Moderately  Isolated  . Frequency of Communication with Friends and Family: More than three times a week  . Frequency of Social Gatherings with Friends and Family: More than three times a week  . Attends Religious Services: 1 to 4 times per year  . Active Member of Clubs or Organizations: No  . Attends Banker Meetings: Never  . Marital Status: Never married    Outpatient Medications Prior to Visit  Medication Sig Dispense Refill  . albuterol (VENTOLIN HFA) 108 (90 Base) MCG/ACT inhaler INHALE 2 PUFFS INTO THE LUNGS EVERY 6 HOURS AS NEEDED FOR WHEEZING OR SHORTNESS OF BREATH (Patient taking differently: Inhale 2 puffs into the lungs every 6 (six) hours as needed for wheezing or shortness of breath.) 54 each 1  . bisoprolol (ZEBETA) 5 MG tablet Take 1 tablet (5 mg total) by mouth daily. 90 tablet 3  . Cholecalciferol (VITAMIN D-3) 125 MCG (5000 UT) TABS Take 5,000 Units by mouth daily.    . Coenzyme Q10 (CO Q 10) 100 MG CAPS Take 100 mg by mouth daily.    . cyclobenzaprine (FLEXERIL) 5 MG tablet Take 1 tablet (5 mg total) by mouth 3 (three) times daily as needed for muscle spasms. 15 tablet 0  . diclofenac Sodium (VOLTAREN) 1 % GEL Apply 2 g topically daily as needed (Pain).    Marland Kitchen ELIQUIS 5 MG TABS tablet TAKE 1 TABLET BY MOUTH TWICE A DAY 180 tablet 2  . FLOVENT HFA 110 MCG/ACT inhaler INHALE 1-2 PUFFS INTO THE LUNGS 2 (TWO) TIMES DAILY 12 each 0  . KLOR-CON M20 20 MEQ tablet TAKE 1 TABLET BY MOUTH EVERY DAY 90 tablet 0  . montelukast (SINGULAIR) 10 MG tablet TAKE 1 TABLET BY MOUTH EVERY DAY IN THE MORNING 90 tablet 0  . Multiple Vitamins-Minerals (MULTIVITAMIN WITH MINERALS) tablet Take 1 tablet by mouth daily. With Iron    . omeprazole (PRILOSEC) 20 MG capsule Take 20 mg by mouth daily.    . Probiotic Product (PROBIOTIC DAILY PO) Take 1 capsule by mouth daily.    . rosuvastatin (  CRESTOR) 5 MG tablet Take 5 mg by mouth daily.     Marland Kitchen triamterene-hydrochlorothiazide (MAXZIDE-25) 37.5-25 MG  tablet TAKE 1/2 TABLET BY MOUTH EVERY DAY 45 tablet 3  . vitamin C (ASCORBIC ACID) 500 MG tablet Take 500 mg by mouth daily.    Marland Kitchen zinc gluconate 50 MG tablet Take 50 mg by mouth daily.     No facility-administered medications prior to visit.     EXAM:  BP 116/70 (BP Location: Left Arm, Patient Position: Sitting, Cuff Size: Large)   Pulse (!) 55   Temp 98 F (36.7 C) (Oral)   Ht 5\' 4"  (1.626 m)   Wt 177 lb 6.4 oz (80.5 kg)   SpO2 96%   BMI 30.45 kg/m   Body mass index is 30.45 kg/m. Wt Readings from Last 3 Encounters:  10/02/20 177 lb 6.4 oz (80.5 kg)  08/26/20 174 lb 1 oz (79 kg)  08/06/20 170 lb (77.1 kg)    GENERAL: vitals reviewed and listed above, alert, oriented, appears well hydrated and in no acute distress HEENT: atraumatic, conjunctiva  clear, no obvious abnormalities on inspection of external nose and ears OP : masked NECK: no obvious masses on inspection palpation  LUNGS: clear to auscultation bilaterally, no wheezes, rales or rhonchi, good air movement CV: HRRR, no clubbing cyanosisnl cap refill  Rate 60   2 + edema  MS: moves all extremities without noticeable focal  abnormality PSYCH: pleasant and cooperative, no obvious depression or anxiety Lab Results  Component Value Date   WBC 5.2 10/02/2020   HGB 11.5 (L) 10/02/2020   HCT 34.9 (L) 10/02/2020   PLT 172.0 10/02/2020   GLUCOSE 77 10/02/2020   CHOL 223 (H) 07/29/2020   TRIG 182 (H) 07/29/2020   HDL 58 07/29/2020   LDLDIRECT 129.0 11/28/2018   LDLCALC 133 (H) 07/29/2020   ALT 38 05/20/2020   AST 35 05/20/2020   NA 139 10/02/2020   K 3.9 10/02/2020   CL 101 10/02/2020   CREATININE 0.69 10/02/2020   BUN 25 (H) 10/02/2020   CO2 30 10/02/2020   TSH 2.05 07/23/2020   HGBA1C 5.9 (H) 01/23/2020   BP Readings from Last 3 Encounters:  10/02/20 116/70  08/26/20 108/70  08/06/20 (!) 96/52   Record review  ASSESSMENT AND PLAN:  Discussed the following assessment and plan:  Dyspnea on exertion -  Plan: DG Chest 2 View, Basic metabolic panel, CBC with Differential/Platelet, Brain natriuretic peptide, Brain natriuretic peptide, Basic metabolic panel, CBC with Differential/Platelet  History of atrial flutter - w cardioversion  Chronic anticoagulation  Personal history of COVID-19 - Pna  12 2021 Concern for possible CHF related to her symptoms plus minus pulmonary cause.  Plan involve cardiology for further evaluation.  After results back. -Patient advised to return or notify health care team  if  new concerns arise. Reviewx ray hops cards  Notes   Plan counsel and  meds  45 minutes  Patient Instructions  Will let  You know  About x ray and lab  Plan to get cardiology  Involved  to see if need to do a  Monitor or other intervention. Let 2022 know if any worsening concerns    [place]. Shanicqua Coldren M.D.

## 2020-10-01 NOTE — Telephone Encounter (Signed)
Shortness of breath on exercise can be from lung or heart.  Issues. I agree should be evaluated.  Since you are having difficulty then can decide best next step. Please arrange office visit.  To help decide    please make sure she is not having an acute shortness of breath event that needs immediate attention.  Cannot tell from the message.

## 2020-10-01 NOTE — Telephone Encounter (Signed)
I spoke with the patient and an office visit was scheduled for 04/19. Patient stated that her shortness of breath only occurs after exercise and the shortness of breath does not last long. Patient stated that she monitors her heart rate after exercise since she has been diagnosed with Afib. She stated that her heart rate does not really change when she has shortness of breath.

## 2020-10-02 ENCOUNTER — Ambulatory Visit (INDEPENDENT_AMBULATORY_CARE_PROVIDER_SITE_OTHER): Payer: Medicare Other | Admitting: Internal Medicine

## 2020-10-02 ENCOUNTER — Ambulatory Visit (INDEPENDENT_AMBULATORY_CARE_PROVIDER_SITE_OTHER): Payer: Medicare Other

## 2020-10-02 ENCOUNTER — Encounter: Payer: Self-pay | Admitting: Internal Medicine

## 2020-10-02 ENCOUNTER — Other Ambulatory Visit: Payer: Self-pay

## 2020-10-02 VITALS — BP 116/70 | HR 55 | Temp 98.0°F | Ht 64.0 in | Wt 177.4 lb

## 2020-10-02 DIAGNOSIS — Z8679 Personal history of other diseases of the circulatory system: Secondary | ICD-10-CM | POA: Diagnosis not present

## 2020-10-02 DIAGNOSIS — R0602 Shortness of breath: Secondary | ICD-10-CM | POA: Diagnosis not present

## 2020-10-02 DIAGNOSIS — R0609 Other forms of dyspnea: Secondary | ICD-10-CM

## 2020-10-02 DIAGNOSIS — I517 Cardiomegaly: Secondary | ICD-10-CM | POA: Diagnosis not present

## 2020-10-02 DIAGNOSIS — Z7901 Long term (current) use of anticoagulants: Secondary | ICD-10-CM

## 2020-10-02 DIAGNOSIS — R0600 Dyspnea, unspecified: Secondary | ICD-10-CM | POA: Diagnosis not present

## 2020-10-02 DIAGNOSIS — Z8616 Personal history of COVID-19: Secondary | ICD-10-CM | POA: Diagnosis not present

## 2020-10-02 LAB — BASIC METABOLIC PANEL
BUN: 25 mg/dL — ABNORMAL HIGH (ref 6–23)
CO2: 30 mEq/L (ref 19–32)
Calcium: 9.2 mg/dL (ref 8.4–10.5)
Chloride: 101 mEq/L (ref 96–112)
Creatinine, Ser: 0.69 mg/dL (ref 0.40–1.20)
GFR: 87.07 mL/min (ref >60.00)
Glucose, Bld: 77 mg/dL (ref 70–99)
Potassium: 3.9 mEq/L (ref 3.5–5.1)
Sodium: 139 mEq/L (ref 135–145)

## 2020-10-02 LAB — BRAIN NATRIURETIC PEPTIDE: Pro B Natriuretic peptide (BNP): 420 pg/mL — ABNORMAL HIGH (ref 0.0–100.0)

## 2020-10-02 LAB — CBC WITH DIFFERENTIAL/PLATELET
Basophils Absolute: 0 10*3/uL (ref 0.0–0.1)
Basophils Relative: 0.4 % (ref 0.0–3.0)
Eosinophils Absolute: 0 10*3/uL (ref 0.0–0.7)
Eosinophils Relative: 0.7 % (ref 0.0–5.0)
HCT: 34.9 % — ABNORMAL LOW (ref 36.0–46.0)
Hemoglobin: 11.5 g/dL — ABNORMAL LOW (ref 12.0–15.0)
Lymphocytes Relative: 17.3 % (ref 12.0–46.0)
Lymphs Abs: 0.9 10*3/uL (ref 0.7–4.0)
MCHC: 33 g/dL (ref 30.0–36.0)
MCV: 82.4 fl (ref 78.0–100.0)
Monocytes Absolute: 0.4 10*3/uL (ref 0.1–1.0)
Monocytes Relative: 7 % (ref 3.0–12.0)
Neutro Abs: 3.9 10*3/uL (ref 1.4–7.7)
Neutrophils Relative %: 74.6 % (ref 43.0–77.0)
Platelets: 172 10*3/uL (ref 150.0–400.0)
RBC: 4.24 Mil/uL (ref 3.87–5.11)
RDW: 16.6 % — ABNORMAL HIGH (ref 11.5–15.5)
WBC: 5.2 10*3/uL (ref 4.0–10.5)

## 2020-10-02 MED ORDER — FUROSEMIDE 20 MG PO TABS: 20.0000 mg | ORAL_TABLET | Freq: Every day | ORAL | 0 refills | Status: DC

## 2020-10-02 NOTE — Patient Instructions (Addendum)
Will let  You know  About x ray and lab  Plan to get cardiology  Involved  to see if need to do a  Monitor or other intervention. Let us know if any worsening concerns

## 2020-10-02 NOTE — Progress Notes (Signed)
So the hormone that sometimes is increased with heart failure is a bit elevated.  Chest x-ray shows no acute findings but border large enlarged heart although when I reviewed it did not look very different from last x-ray.  There are some markings that were left over perhaps from the COVID infection but no worsening.  I am going to ask for cardiology and Dr. Charlott Rakes team to opine on your symptoms consideration of a monitor.    Please send in furosemide 20 mg take 1 a day for next 10 days   disp 14     to help decrease the fluid on your feet.   Check your weights daily and record.  We may have to add a potassium supplement if we stay on the f diuretic. Send in message next week  about how doing  unless   cardiology team  has taken over for evaluation .

## 2020-10-04 ENCOUNTER — Other Ambulatory Visit: Payer: Self-pay

## 2020-10-04 ENCOUNTER — Ambulatory Visit (INDEPENDENT_AMBULATORY_CARE_PROVIDER_SITE_OTHER): Payer: Medicare Other

## 2020-10-04 DIAGNOSIS — R0600 Dyspnea, unspecified: Secondary | ICD-10-CM

## 2020-10-04 DIAGNOSIS — I4819 Other persistent atrial fibrillation: Secondary | ICD-10-CM

## 2020-10-04 DIAGNOSIS — R0609 Other forms of dyspnea: Secondary | ICD-10-CM

## 2020-10-04 DIAGNOSIS — I4892 Unspecified atrial flutter: Secondary | ICD-10-CM

## 2020-10-04 NOTE — Progress Notes (Signed)
Per request of Dr Tenny Craw order 2 week Zio monitor.  Order placed

## 2020-10-08 DIAGNOSIS — I4892 Unspecified atrial flutter: Secondary | ICD-10-CM

## 2020-10-08 DIAGNOSIS — R0600 Dyspnea, unspecified: Secondary | ICD-10-CM

## 2020-10-08 DIAGNOSIS — I4819 Other persistent atrial fibrillation: Secondary | ICD-10-CM | POA: Diagnosis not present

## 2020-10-14 ENCOUNTER — Other Ambulatory Visit: Payer: Self-pay | Admitting: Internal Medicine

## 2020-10-14 DIAGNOSIS — Z1231 Encounter for screening mammogram for malignant neoplasm of breast: Secondary | ICD-10-CM

## 2020-10-16 NOTE — Telephone Encounter (Signed)
Thanks for the update. Forwarding comments to Dr Tenny Craw  Keep follow up with cardiology   And can follow up appt  with me after levaluation opinion.

## 2020-10-20 NOTE — Progress Notes (Signed)
Cardiology Office Note   Date:  10/22/2020   ID:  Penny Barnett, DOB Dec 08, 1950, MRN [id number]  PCP:  Madelin Headings, MD  Cardiologist:   Dietrich Pates, MD   F/U of PA flutter  And Fibrillaton  and HTN      History of Present Illness: Penny Barnett is a 70 y.o. female with a history of atrial flutter    S/p TEE /cardioversion in 2016   Also a hx of HTN, HL, asthma The pt was admitted in November 2021 for COVID infeciton   She received monoclonal AB but then was hospitalzed on 05/13/20 with hypoxia  FOund to be in atrial fibrillation     She was found to be hyperthroid and was placed on methmizole Sent home on b blocker and Eliquis  I saw her back earlier this winter    She was off methimazole  Since seen in clinic she denies palpitations   No fast HR     No chest pain  Breathing is OK       3 wks ago had trouble breathing   Then it stopped  ? Viral    She says she thinks that COVID affected her thyroid   She is off of meds now and thyroid funciton is OK     Current Meds  Medication Sig  . albuterol (VENTOLIN HFA) 108 (90 Base) MCG/ACT inhaler INHALE 2 PUFFS INTO THE LUNGS EVERY 6 HOURS AS NEEDED FOR WHEEZING OR SHORTNESS OF BREATH (Patient taking differently: Inhale 2 puffs into the lungs every 6 (six) hours as needed for wheezing or shortness of breath.)  . bisoprolol (ZEBETA) 5 MG tablet Take 1 tablet (5 mg total) by mouth daily.  . Cholecalciferol (VITAMIN D-3) 125 MCG (5000 UT) TABS Take 5,000 Units by mouth daily.  . Coenzyme Q10 (CO Q 10) 100 MG CAPS Take 100 mg by mouth daily.  . cyclobenzaprine (FLEXERIL) 5 MG tablet Take 1 tablet (5 mg total) by mouth 3 (three) times daily as needed for muscle spasms.  . diclofenac Sodium (VOLTAREN) 1 % GEL Apply 2 g topically daily as needed (Pain).  Marland Kitchen ELIQUIS 5 MG TABS tablet TAKE 1 TABLET BY MOUTH TWICE A DAY  . FLOVENT HFA 110 MCG/ACT inhaler INHALE 1-2 PUFFS INTO THE LUNGS 2 (TWO) TIMES DAILY  . furosemide (LASIX) 20 MG  tablet Take 1 tablet (20 mg total) by mouth daily. For 10 days or as directed for swelling ,fluid retention  . KLOR-CON M20 20 MEQ tablet TAKE 1 TABLET BY MOUTH EVERY DAY  . montelukast (SINGULAIR) 10 MG tablet TAKE 1 TABLET BY MOUTH EVERY DAY IN THE MORNING  . Multiple Vitamins-Minerals (MULTIVITAMIN WITH MINERALS) tablet Take 1 tablet by mouth daily. With Iron  . omeprazole (PRILOSEC) 20 MG capsule Take 20 mg by mouth daily.  . Probiotic Product (PROBIOTIC DAILY PO) Take 1 capsule by mouth daily.  . rosuvastatin (CRESTOR) 5 MG tablet Take 5 mg by mouth daily.   Marland Kitchen triamterene-hydrochlorothiazide (MAXZIDE-25) 37.5-25 MG tablet TAKE 1/2 TABLET BY MOUTH EVERY DAY  . vitamin C (ASCORBIC ACID) 500 MG tablet Take 500 mg by mouth daily.  Marland Kitchen zinc gluconate 50 MG tablet Take 50 mg by mouth daily.     Allergies:   Penicillins   Past Medical History:  Diagnosis Date  . Allergy   . Anemia   . Arthritis    hands  . Asthma    as a child and then attack 1996  poss allergic to cat hair. ventilated x 2 . saw Dr [place] Callas then .   Marland Kitchen Atrial flutter (HCC)    a. 12/2014 Echo: EF 60-65%, no rwma, mild MR;  b. S/P TEE/DCCV;  c. CHA2DS2VASc = 3-->Eliquis.  . Cataract    Had surgery to remove  . GERD (gastroesophageal reflux disease)    2000 taking otc prevacid   evey day.   . Hyperlipidemia   . Hypertension     3 meds controlled  . Kidney stone    passed stone - no surgery required    Past Surgical History:  Procedure Laterality Date  . CARDIOVERSION N/A 01/03/2015   Procedure: CARDIOVERSION;  Surgeon: Pricilla Riffle, MD;  Location: [facility] ENDOSCOPY;  Service: Cardiovascular;  Laterality: N/A;  . CARDIOVERSION N/A 08/06/2020   Procedure: CARDIOVERSION;  Surgeon: [name], MD;  Location: [facility] ENDOSCOPY;  Service: Cardiovascular;  Laterality: N/A;  . CATARACT EXTRACTION Bilateral 10/14/2010   both eyes  . COLONOSCOPY  02/2011   Arlyce Dice  . SHOULDER SURGERY Bilateral    x 2 - frozen shoulder rt 2006,  2000 left   . TEE WITHOUT CARDIOVERSION N/A 01/03/2015   Procedure: TRANSESOPHAGEAL ECHOCARDIOGRAM (TEE);  Surgeon: Pricilla Riffle, MD;  Location: [facility] ENDOSCOPY;  Service: Cardiovascular;  Laterality: N/A;  . TONSILLECTOMY       Social History:  The patient  reports that she quit smoking about 36 years ago. Her smoking use included cigarettes. She has a 20.00 pack-year smoking history. She has never used smokeless tobacco. She reports that she does not drink alcohol and does not use drugs.   Family History:  The patient's family history includes Alcohol abuse in her father; Bladder Cancer in her mother; Breast cancer in her maternal grandmother and mother; Uterine cancer in her sister.    ROS:  Please see the history of present illness. All other systems are reviewed and  Negative to the above problem except as noted.    PHYSICAL EXAM: VS:  BP 128/70   Pulse 72   Ht 5\' 4"  (1.626 m)   Wt 168 lb 6.4 oz (76.4 kg)   SpO2 94%   BMI 28.91 kg/m   GEN: Well nourished, well developed, in no acute distress  HEENT: normal  Neck: JVP normal   No carotid bruits Cardiac: Irreg irreg  ; no murmurs   No LE  edema  Respiratory:  clear to auscultation  GI: soft, nontender, nondistended, + BS  No hepatomegaly  MS: no deformity Moving all extremities   Skin: warm and dry, no rash Neuro:  Strength and sensation are intact Psych: euthymic mood, full affect   EKG:  EKG is not ordered today.     Lipid Panel    Component Value Date/Time   CHOL 223 (H) 07/29/2020 1208   TRIG 182 (H) 07/29/2020 1208   HDL 58 07/29/2020 1208   CHOLHDL 3.8 07/29/2020 1208   CHOLHDL 5.0 (H) 01/23/2020 1022   VLDL 47.0 (H) 11/28/2018 1048   LDLCALC 133 (H) 07/29/2020 1208   LDLCALC 170 (H) 01/23/2020 1022   LDLDIRECT 129.0 11/28/2018 1048      Wt Readings from Last 3 Encounters:  10/22/20 168 lb 6.4 oz (76.4 kg)  10/02/20 177 lb 6.4 oz (80.5 kg)  08/26/20 174 lb 1 oz (79 kg)      ASSESSMENT AND PLAN:  1   Atrial fibrillation   The pt was found to be in atrial fibrillation back in Nov 2021 when  sick with COVID   Note that her thyroid was subtley off at that time   Now improved and off meds The pt underwent cardioversion in Feb 2022     Curretnly asymtpoamtic   She is not in afib on exam.  I will review monitor that she just turned to see if it has any atrial fibrillaion  Will need to make decision on long term anticoagulation   She had flutter in2016 and now this  (in setting of other things)   Told her I would call her.   2  HL  Lpids in Feb LDL 133   HDL 58   Trig 182   Watch diet   (fats, carbs)  3  HTN  BP is adequately controlled    Tentative plan for f/u next year     Signed, Dietrich Pates, MD  10/22/2020 11:59 AM    [facility] [address] [place], [place], [place]  [address] Phone: [phone]; Fax: [phone]

## 2020-10-22 ENCOUNTER — Other Ambulatory Visit: Payer: Self-pay

## 2020-10-22 ENCOUNTER — Encounter: Payer: Self-pay | Admitting: Internal Medicine

## 2020-10-22 ENCOUNTER — Ambulatory Visit: Payer: Medicare Other | Admitting: Internal Medicine

## 2020-10-22 VITALS — BP 128/70 | HR 72 | Ht 64.0 in | Wt 168.4 lb

## 2020-10-22 DIAGNOSIS — I480 Paroxysmal atrial fibrillation: Secondary | ICD-10-CM

## 2020-10-22 NOTE — Patient Instructions (Signed)
Medication Instructions:  No changes *If you need a refill on your cardiac medications before your next appointment, please call your pharmacy*   Lab Work: none If you have labs (blood work) drawn today and your tests are completely normal, you will receive your results only by: Marland Kitchen MyChart Message (if you have MyChart) OR . A paper copy in the mail If you have any lab test that is abnormal or we need to change your treatment, we will call you to review the results.   Testing/Procedures: none   Follow-Up: At [facility], you and your health needs are our priority.  As part of our continuing mission to provide you with exceptional heart care, we have created designated Provider Care Teams.  These Care Teams include your primary Cardiologist (physician) and Advanced Practice Providers (APPs -  Physician Assistants and Nurse Practitioners) who all work together to provide you with the care you need, when you need it.   Your next appointment:   12 month(s)  The format for your next appointment:   In Person  Provider:   You may see Dietrich Pates, MD or one of the following Advanced Practice Providers on your designated Care Team:    Tereso Newcomer, PA-C  Chelsea Aus, [place]    Other Instructions We will contact you with results and recommendations once Dr. Tenny Craw reviews the monitor.

## 2020-10-23 DIAGNOSIS — I4892 Unspecified atrial flutter: Secondary | ICD-10-CM | POA: Diagnosis not present

## 2020-10-23 DIAGNOSIS — I4819 Other persistent atrial fibrillation: Secondary | ICD-10-CM | POA: Diagnosis not present

## 2020-10-23 DIAGNOSIS — R0600 Dyspnea, unspecified: Secondary | ICD-10-CM | POA: Diagnosis not present

## 2020-10-30 ENCOUNTER — Other Ambulatory Visit: Payer: Self-pay | Admitting: Internal Medicine

## 2020-10-31 DIAGNOSIS — H524 Presbyopia: Secondary | ICD-10-CM | POA: Diagnosis not present

## 2020-10-31 DIAGNOSIS — H5211 Myopia, right eye: Secondary | ICD-10-CM | POA: Diagnosis not present

## 2020-10-31 DIAGNOSIS — H43813 Vitreous degeneration, bilateral: Secondary | ICD-10-CM | POA: Diagnosis not present

## 2020-11-14 DIAGNOSIS — Z1231 Encounter for screening mammogram for malignant neoplasm of breast: Secondary | ICD-10-CM

## 2020-12-04 ENCOUNTER — Other Ambulatory Visit: Payer: Self-pay | Admitting: Internal Medicine

## 2020-12-05 ENCOUNTER — Other Ambulatory Visit: Payer: Self-pay

## 2020-12-05 ENCOUNTER — Ambulatory Visit
Admission: RE | Admit: 2020-12-05 | Discharge: 2020-12-05 | Disposition: A | Discharge status: Home / Self Care | Payer: Medicare Other | Source: Ambulatory Visit | Attending: Internal Medicine | Admitting: Internal Medicine

## 2020-12-05 ENCOUNTER — Ambulatory Visit (INDEPENDENT_AMBULATORY_CARE_PROVIDER_SITE_OTHER): Payer: Medicare Other

## 2020-12-05 ENCOUNTER — Telehealth: Payer: Self-pay | Admitting: *Deleted

## 2020-12-05 DIAGNOSIS — Z1231 Encounter for screening mammogram for malignant neoplasm of breast: Secondary | ICD-10-CM

## 2020-12-05 DIAGNOSIS — I480 Paroxysmal atrial fibrillation: Secondary | ICD-10-CM

## 2020-12-05 NOTE — Telephone Encounter (Signed)
Penny Riffle, MD  11/26/2020 10:05 PM EDT      I have reviewed with EP   Concern that she may be having afib and not sensing   Her thyroid function was not that abnormal when she had afib    Yes she didhave COVID but atrial fibrillation is relatively common and risk for CVA is real Would recomm a 2 wk monitor at the end of August   If no afib then probablyrecomm stop anticoagulatoin   ____________________________________ The pt has been made aware of the results and recommendations.  She is in agreement with this plan.  Order placed for 14 day Zio XT with plan for pt to wear at the end of August.

## 2020-12-05 NOTE — Progress Notes (Unsigned)
Enrolled patient for a 14 day Zio XT monitor to be mailed to patients home  Patient to wear end of Aug

## 2020-12-08 ENCOUNTER — Other Ambulatory Visit: Payer: Self-pay | Admitting: Internal Medicine

## 2020-12-12 ENCOUNTER — Other Ambulatory Visit: Payer: Self-pay | Admitting: Internal Medicine

## 2021-01-04 ENCOUNTER — Other Ambulatory Visit: Payer: Self-pay | Admitting: Internal Medicine

## 2021-01-18 DIAGNOSIS — Z0189 Encounter for other specified special examinations: Secondary | ICD-10-CM

## 2021-01-18 DIAGNOSIS — Z0110 Encounter for examination of ears and hearing without abnormal findings: Secondary | ICD-10-CM

## 2021-01-31 NOTE — Telephone Encounter (Signed)
Refer for hearing  audiology   Dermatology as per her request

## 2021-01-31 NOTE — Telephone Encounter (Signed)
Referral has been placed and pt is aware. 

## 2021-02-01 DIAGNOSIS — I480 Paroxysmal atrial fibrillation: Secondary | ICD-10-CM

## 2021-02-03 ENCOUNTER — Other Ambulatory Visit: Payer: Self-pay

## 2021-02-03 NOTE — Progress Notes (Signed)
Chief Complaint  Patient presents with   Annual Exam     HPI: Patient  Penny Barnett  70 y.o. comes in today for Preventive Health Care visit and medication check Respiratory allergy has not seen Dr. Pitt Callas in years but is continuing on the inhaler regimen.  However some inhalers not covered by insurance. Taking Flovent every day and albuterol as needed although most days couple times a day.  She has had what is described as asthma for years. She is under evaluation for possible atrial flutter versus other tachycardia has a heart monitor on patches itching but will see it through.  Blood pressure seems to be okay is no longer taking Lasix but is taking her potassium Is not taking the rosuvastatin has tried it at low-dose even a couple times a week and gets what she calls joints pains or stiffness.  This also happened on atorvastatin and may be another medication.  Tries to eat healthy and stay active.  Audiology appointment and dermatology appointment is pending she has some spots she is not sure of and needs hearing evaluation. Takes iron  no bleeding Does not think she has residual from COVID but not sure. Health Maintenance  Topic Date Due   Zoster Vaccines- Shingrix (1 of 2) Never done   TETANUS/TDAP  06/15/2018   INFLUENZA VACCINE  01/13/2021   COVID-19 Vaccine (1) 02/20/2021 (Originally 12/06/1955)   MAMMOGRAM  12/05/2021   COLONOSCOPY (Pts 45-68yrs Insurance coverage will need to be confirmed)  04/27/2026   DEXA SCAN  Completed   Hepatitis C Screening  Completed   PNA vac Low Risk Adult  Completed   HPV VACCINES  Aged Out   Health Maintenance Review LIFESTYLE:  Exercise:  [place]   walks and drives  seeing chriopractor.  For exercising  Tobacco/ETS: n Alcohol:  rare  Sugar beverages: no x OJ carnberry low calorie Sleep: varies    8-9  Drug use: no HH of 1  small dog      ROS:  As per hpi Past Medical History:  Diagnosis Date   Allergy    Anemia     Arthritis    hands   Asthma    as a child and then attack 1996  poss allergic to cat hair. ventilated x 2 . saw Dr Rio Callas then .    Atrial flutter (HCC)    a. 12/2014 Echo: EF 60-65%, no rwma, mild MR;  b. S/P TEE/DCCV;  c. CHA2DS2VASc = 3-->Eliquis.   Cataract    Had surgery to remove   GERD (gastroesophageal reflux disease)    2000 taking otc prevacid   evey day.    Hyperlipidemia    Hypertension     3 meds controlled   Kidney stone    passed stone - no surgery required    Past Surgical History:  Procedure Laterality Date   CARDIOVERSION N/A 01/03/2015   Procedure: CARDIOVERSION;  Surgeon: Pricilla Riffle, MD;  Location: [facility] ENDOSCOPY;  Service: Cardiovascular;  Laterality: N/A;   CARDIOVERSION N/A 08/06/2020   Procedure: CARDIOVERSION;  Surgeon: [name], MD;  Location: [facility] ENDOSCOPY;  Service: Cardiovascular;  Laterality: N/A;   CATARACT EXTRACTION Bilateral 10/14/2010   both eyes   COLONOSCOPY  02/2011   [facility] SURGERY Bilateral    x 2 - frozen shoulder rt 2006, 2000 left    TEE WITHOUT CARDIOVERSION N/A 01/03/2015   Procedure: TRANSESOPHAGEAL ECHOCARDIOGRAM (TEE);  Surgeon: Pricilla Riffle, MD;  Location:  [facility] ENDOSCOPY;  Service: Cardiovascular;  Laterality: N/A;   TONSILLECTOMY      Family History  Problem Relation Age of Onset   Breast cancer Mother    Bladder Cancer Mother    Uterine cancer Sister    Alcohol abuse Father    Breast cancer Maternal Grandmother    Colon cancer Neg Hx    Heart attack Neg Hx    Stroke Neg Hx    Hypertension Neg Hx    Esophageal cancer Neg Hx    Rectal cancer Neg Hx    Stomach cancer Neg Hx     Social History   Socioeconomic History   Marital status: Single    Spouse name: Not on file   Number of children: Not on file   Years of education: Not on file   Highest education level: Not on file  Occupational History   Occupation: CSR  Tobacco Use   Smoking status: Former    Packs/day: 1.00    Years: 20.00    Pack  years: 20.00    Types: Cigarettes    Quit date: 06/21/1984    Years since quitting: 36.6   Smokeless tobacco: Never  Vaping Use   Vaping Use: Never used  Substance and Sexual Activity   Alcohol use: No    Alcohol/week: 0.0 standard drinks   Drug use: No   Sexual activity: Not on file  Other Topics Concern   Not on file  Social History Narrative   [place]      In [place] for 30 years  Has family here   Works 45 per week [employer]    And auto auction 2 nights per week.  Horticulturist, commercialDealer register  .    HS education  single   6 hours of sleep   Hh  Of 1  Pet dog and a horse.    Remote tobacco  1-2 ppd stopped 1988    Etoh couple times a month rare    ocass  Diet soda.             Social Determinants of Health   Financial Resource Strain: Low Risk    Difficulty of Paying Living Expenses: Not hard at all  Food Insecurity: No Food Insecurity   Worried About Programme researcher, broadcasting/film/videounning Out of Food in the Last Year: Never true   Ran Out of Food in the Last Year: Never true  Transportation Needs: No Transportation Needs   Lack of Transportation (Medical): No   Lack of Transportation (Non-Medical): No  Physical Activity: Insufficiently Active   Days of Exercise per Week: 2 days   Minutes of Exercise per Session: 50 min  Stress: No Stress Concern Present   Feeling of Stress : Not at all  Social Connections: Moderately Isolated   Frequency of Communication with Friends and Family: More than three times a week   Frequency of Social Gatherings with Friends and Family: More than three times a week   Attends Religious Services: 1 to 4 times per year   Active Member of [employer] or Organizations: No   Attends BankerClub or Organization Meetings: Never   Marital Status: Never married    Outpatient Medications Prior to Visit  Medication Sig Dispense Refill   albuterol (VENTOLIN HFA) 108 (90 Base) MCG/ACT inhaler INHALE 2 PUFFS INTO THE LUNGS EVERY 6 HOURS AS NEEDED FOR WHEEZING OR SHORTNESS OF BREATH (Patient taking  differently: Inhale 2 puffs into the lungs every 6 (six) hours as needed for wheezing  or shortness of breath.) 54 each 1   bisoprolol (ZEBETA) 5 MG tablet Take 1 tablet (5 mg total) by mouth daily. 90 tablet 3   Cholecalciferol (VITAMIN D-3) 125 MCG (5000 UT) TABS Take 5,000 Units by mouth daily.     Coenzyme Q10 (CO Q 10) 100 MG CAPS Take 100 mg by mouth daily.     diclofenac Sodium (VOLTAREN) 1 % GEL Apply 2 g topically daily as needed (Pain).     ELIQUIS 5 MG TABS tablet TAKE 1 TABLET BY MOUTH TWICE A DAY 180 tablet 2   FLOVENT HFA 110 MCG/ACT inhaler INHALE 1-2 PUFFS INTO THE LUNGS 2 (TWO) TIMES DAILY 12 each 2   KLOR-CON M20 20 MEQ tablet TAKE 1 TABLET BY MOUTH EVERY DAY 90 tablet 0   montelukast (SINGULAIR) 10 MG tablet TAKE 1 TABLET BY MOUTH EVERY DAY IN THE MORNING 90 tablet 0   Multiple Vitamins-Minerals (MULTIVITAMIN WITH MINERALS) tablet Take 1 tablet by mouth daily. With Iron     omeprazole (PRILOSEC) 20 MG capsule Take 20 mg by mouth daily.     Probiotic Product (PROBIOTIC DAILY PO) Take 1 capsule by mouth daily.     triamterene-hydrochlorothiazide (MAXZIDE-25) 37.5-25 MG tablet TAKE 1/2 TABLET BY MOUTH EVERY DAY 45 tablet 3   vitamin C (ASCORBIC ACID) 500 MG tablet Take 500 mg by mouth daily.     zinc gluconate 50 MG tablet Take 50 mg by mouth daily.     cyclobenzaprine (FLEXERIL) 5 MG tablet Take 1 tablet (5 mg total) by mouth 3 (three) times daily as needed for muscle spasms. 15 tablet 0   furosemide (LASIX) 20 MG tablet Take 1 tablet (20 mg total) by mouth daily. For 10 days or as directed for swelling ,fluid retention 14 tablet 0   rosuvastatin (CRESTOR) 5 MG tablet Take 5 mg by mouth daily.      No facility-administered medications prior to visit.     EXAM:  BP 124/70 (BP Location: Left Arm, Patient Position: Sitting, Cuff Size: Normal)   Pulse 61   Temp 98.5 F (36.9 C) (Oral)   Ht 5\' 4"  (1.626 m)   Wt 172 lb 3.2 oz (78.1 kg)   SpO2 95%   BMI 29.56 kg/m   Body  mass index is 29.56 kg/m. Wt Readings from Last 3 Encounters:  02/04/21 172 lb 3.2 oz (78.1 kg)  10/22/20 168 lb 6.4 oz (76.4 kg)  10/02/20 177 lb 6.4 oz (80.5 kg)    Physical Exam: Vital signs reviewed 10/04/20 is a well-developed well-nourished alert cooperative    who appearsr stated age in no acute distress.  HEENT: normocephalic atraumatic , Eyes: PERRL EOM's full, conjunctiva clear,., Ears: no deformity EAC's clear TMs with normal landmarks. Mouth: clear OP,masked  NECK: supple without masses, thyromegaly or bruits. CHEST/PULM:  Clear to auscultation and percussion breath sounds equal no wheeze , rales or rhonchi. No chest wall deformities or tenderness.  Has patch heart monitor on left upper chest. Breast: normal by inspection . No dimpling, discharge, masses, tenderness or discharge . CV: PMI is nondisplaced, S1 S2 no gallops, murmurs, rubs. Peripheral pulses are full without delay.No JVD .  ABDOMEN: Bowel sounds normal nontender  No guard or rebound, no hepato splenomegal no CVA tenderness.   Extremtities:  No clubbing cyanosis or edema, no acute joint swelling or redness no focal atrophy varicose veins  NEURO:  Oriented x3, cranial nerves 3-12 appear to be intact, no obvious focal weakness,gait within normal  limits no abnormal reflexes or asymmetrical SKIN: No acute rashes normal turgor, color, no bruising or petechiae. Freckling  and  back with 2 sks?  PSYCH: Oriented, good eye contact, no obvious depression anxiety, cognition and judgment appear normal. LN: no cervical axillary inguinal adenopathy  Lab Results  Component Value Date   WBC 5.2 10/02/2020   HGB 11.5 (L) 10/02/2020   HCT 34.9 (L) 10/02/2020   PLT 172.0 10/02/2020   GLUCOSE 77 10/02/2020   CHOL 223 (H) 07/29/2020   TRIG 182 (H) 07/29/2020   HDL 58 07/29/2020   LDLDIRECT 129.0 11/28/2018   LDLCALC 133 (H) 07/29/2020   ALT 38 05/20/2020   AST 35 05/20/2020   NA 139 10/02/2020   K 3.9 10/02/2020   CL 101  10/02/2020   CREATININE 0.69 10/02/2020   BUN 25 (H) 10/02/2020   CO2 30 10/02/2020   TSH 2.05 07/23/2020   HGBA1C 5.9 (H) 01/23/2020    BP Readings from Last 3 Encounters:  02/04/21 124/70  10/22/20 128/70  10/02/20 116/70    Lab plan reviewed with patient   ASSESSMENT AND PLAN:  Discussed the following assessment and plan:    ICD-10-CM   1. Visit for preventive health examination  Z00.00     2. Asthma, unspecified asthma severity, unspecified whether complicated, unspecified whether persistent  J45.909 CBC with Differential/Platelet    Lipid panel    Iron, TIBC and Ferritin Panel    Vitamin B12    Hepatic function panel    CBC with Differential/Platelet    Lipid panel    Iron, TIBC and Ferritin Panel    Vitamin B12    Hepatic function panel    Ambulatory referral to Pulmonology    3. Chronic anticoagulation  Z79.01 CBC with Differential/Platelet    Lipid panel    Iron, TIBC and Ferritin Panel    Vitamin B12    Hepatic function panel    CBC with Differential/Platelet    Lipid panel    Iron, TIBC and Ferritin Panel    Vitamin B12    Hepatic function panel    4. Medication management  Z79.899 CBC with Differential/Platelet    Lipid panel    Iron, TIBC and Ferritin Panel    Vitamin B12    Hepatic function panel    Basic metabolic panel    CBC with Differential/Platelet    Lipid panel    Iron, TIBC and Ferritin Panel    Vitamin B12    Hepatic function panel    Basic metabolic panel    5. Anemia, unspecified type  D64.9 CBC with Differential/Platelet    Lipid panel    Iron, TIBC and Ferritin Panel    Vitamin B12    Hepatic function panel    CBC with Differential/Platelet    Lipid panel    Iron, TIBC and Ferritin Panel    Vitamin B12    Hepatic function panel    6. History of atrial flutter  Z86.79 CBC with Differential/Platelet    Lipid panel    Iron, TIBC and Ferritin Panel    Vitamin B12    Hepatic function panel    CBC with  Differential/Platelet    Lipid panel    Iron, TIBC and Ferritin Panel    Vitamin B12    Hepatic function panel    7. Hyperlipidemia, unspecified hyperlipidemia type  E78.5 CBC with Differential/Platelet    Lipid panel    Iron, TIBC and Ferritin Panel    Vitamin  B12    Hepatic function panel    Basic metabolic panel    CBC with Differential/Platelet    Lipid panel    Iron, TIBC and Ferritin Panel    Vitamin B12    Hepatic function panel    Basic metabolic panel   se cresto and atorva  others consdier zetia if inidcated     8. History of COVID-19  Z86.16 Ambulatory referral to Pulmonology   pneumonia 12 21     9. Myositis, unspecified myositis type, unspecified site from statin  M60.9    atorva and crestor low dose     Would benefit from pulmonary evaluation spirometry assessment for asthma post COVID effects ince she had  covid pna in 12 21  She may benefit from combination inhaler as opposed to her intermittent use of albuterol. Referral placed   Being evaluated by cardiology for atrial fib flutter versus other. On anticoagulation for present time. had inc BNP and CM after covid  but nl lv function echo pre covid  Audiology and dermatology referrals pending. Lab today uncertain why she has borderline anemia although she is on anticoagulation, she is taking iron no bleeding reassess. And go from there.  She is not taking the rosuvastatin because of side effects that she feels even at low-dose.  We will still have updated lipid panel today.  Return for 6-12 mos , depending on results and how doing.  Patient Care Team: Jalana Moore, Neta Mends, MD as PCP - General (Internal Medicine) Pricilla Riffle, MD as PCP - Cardiology (Cardiology) Nita Sells, MD (Dermatology) Myrtie Neither Andreas Blower, MD as Consulting Physician (Gastroenterology) Patient Instructions  Good to see you today . Will notify you  of labs when available. And will share with Dr Tenny Craw.   Continue lifestyle intervention healthy  eating and exercise .   Will do pulmonary consults about the asthma resp management  I suspect  other inhalers   controller meds may be better for you.  And  should prob get  lung function tests done ( they can order these)   Plan fu with me depending or  6-12 months   Marcena Dias K. Briceyda Abdullah M.D.

## 2021-02-04 ENCOUNTER — Ambulatory Visit (INDEPENDENT_AMBULATORY_CARE_PROVIDER_SITE_OTHER): Payer: Medicare Other | Admitting: Internal Medicine

## 2021-02-04 ENCOUNTER — Encounter: Payer: Self-pay | Admitting: Internal Medicine

## 2021-02-04 VITALS — BP 124/70 | HR 61 | Temp 98.5°F | Ht 64.0 in | Wt 172.2 lb

## 2021-02-04 DIAGNOSIS — Z7901 Long term (current) use of anticoagulants: Secondary | ICD-10-CM

## 2021-02-04 DIAGNOSIS — Z79899 Other long term (current) drug therapy: Secondary | ICD-10-CM | POA: Diagnosis not present

## 2021-02-04 DIAGNOSIS — E785 Hyperlipidemia, unspecified: Secondary | ICD-10-CM

## 2021-02-04 DIAGNOSIS — M609 Myositis, unspecified: Secondary | ICD-10-CM | POA: Diagnosis not present

## 2021-02-04 DIAGNOSIS — Z8616 Personal history of COVID-19: Secondary | ICD-10-CM | POA: Diagnosis not present

## 2021-02-04 DIAGNOSIS — D649 Anemia, unspecified: Secondary | ICD-10-CM

## 2021-02-04 DIAGNOSIS — Z0000 Encounter for general adult medical examination without abnormal findings: Secondary | ICD-10-CM

## 2021-02-04 DIAGNOSIS — Z8679 Personal history of other diseases of the circulatory system: Secondary | ICD-10-CM | POA: Diagnosis not present

## 2021-02-04 DIAGNOSIS — J45909 Unspecified asthma, uncomplicated: Secondary | ICD-10-CM | POA: Diagnosis not present

## 2021-02-04 LAB — LIPID PANEL
Cholesterol: 276 mg/dL — ABNORMAL HIGH (ref 0–200)
HDL: 61.8 mg/dL (ref >39.00)
LDL Cholesterol: 181 mg/dL — ABNORMAL HIGH (ref 0–99)
NonHDL: 214.22
Total CHOL/HDL Ratio: 4
Triglycerides: 164 mg/dL — ABNORMAL HIGH (ref 0.0–149.0)
VLDL: 32.8 mg/dL (ref 0.0–40.0)

## 2021-02-04 LAB — CBC WITH DIFFERENTIAL/PLATELET
Basophils Absolute: 0 10*3/uL (ref 0.0–0.1)
Basophils Relative: 0.5 % (ref 0.0–3.0)
Eosinophils Absolute: 0 10*3/uL (ref 0.0–0.7)
Eosinophils Relative: 0.5 % (ref 0.0–5.0)
HCT: 39.4 % (ref 36.0–46.0)
Hemoglobin: 13.2 g/dL (ref 12.0–15.0)
Lymphocytes Relative: 19.2 % (ref 12.0–46.0)
Lymphs Abs: 1 10*3/uL (ref 0.7–4.0)
MCHC: 33.4 g/dL (ref 30.0–36.0)
MCV: 84.9 fl (ref 78.0–100.0)
Monocytes Absolute: 0.4 10*3/uL (ref 0.1–1.0)
Monocytes Relative: 6.9 % (ref 3.0–12.0)
Neutro Abs: 3.8 10*3/uL (ref 1.4–7.7)
Neutrophils Relative %: 72.9 % (ref 43.0–77.0)
Platelets: 226 10*3/uL (ref 150.0–400.0)
RBC: 4.64 Mil/uL (ref 3.87–5.11)
RDW: 14.9 % (ref 11.5–15.5)
WBC: 5.2 10*3/uL (ref 4.0–10.5)

## 2021-02-04 LAB — HEPATIC FUNCTION PANEL
ALT: 20 U/L (ref 0–35)
AST: 18 U/L (ref 0–37)
Albumin: 4.4 g/dL (ref 3.5–5.2)
Alkaline Phosphatase: 94 U/L (ref 39–117)
Bilirubin, Direct: 0.1 mg/dL (ref 0.0–0.3)
Total Bilirubin: 0.4 mg/dL (ref 0.2–1.2)
Total Protein: 7.1 g/dL (ref 6.0–8.3)

## 2021-02-04 LAB — VITAMIN B12: Vitamin B-12: 295 pg/mL (ref 211–911)

## 2021-02-04 LAB — BASIC METABOLIC PANEL
BUN: 25 mg/dL — ABNORMAL HIGH (ref 6–23)
CO2: 29 mEq/L (ref 19–32)
Calcium: 9.6 mg/dL (ref 8.4–10.5)
Chloride: 97 mEq/L (ref 96–112)
Creatinine, Ser: 0.72 mg/dL (ref 0.40–1.20)
GFR: 83.68 mL/min (ref >60.00)
Glucose, Bld: 100 mg/dL — ABNORMAL HIGH (ref 70–99)
Potassium: 3.4 mEq/L — ABNORMAL LOW (ref 3.5–5.1)
Sodium: 138 mEq/L (ref 135–145)

## 2021-02-04 NOTE — Patient Instructions (Signed)
Good to see you today . Will notify you  of labs when available. And will share with Dr Tenny Craw.   Continue lifestyle intervention healthy eating and exercise .   Will do pulmonary consults about the asthma resp management  I suspect  other inhalers   controller meds may be better for you.  And  should prob get  lung function tests done ( they can order these)   Plan fu with me depending or  6-12 months

## 2021-02-05 LAB — IRON,TIBC AND FERRITIN PANEL
%SAT: 20 % (calc) (ref 16–45)
Ferritin: 130 ng/mL (ref 16–288)
Iron: 74 ug/dL (ref 45–160)
TIBC: 365 mcg/dL (calc) (ref 250–450)

## 2021-02-13 DIAGNOSIS — I480 Paroxysmal atrial fibrillation: Secondary | ICD-10-CM | POA: Diagnosis not present

## 2021-02-20 NOTE — Progress Notes (Signed)
Apologies for late message.  Iron level normal . Cholesterol is back up off medication ;blood count is normal .  Potassium still borderline   low again    b12 is low normal . Suggest  add    b complex vitamins or b12 once a day.  Would have dr Tenny Craw opine about cholesterol medications since you had side effects . There are other  ones to try . Pravastatin  or non statin such as  zetia

## 2021-02-21 ENCOUNTER — Telehealth: Payer: Self-pay | Admitting: *Deleted

## 2021-02-21 DIAGNOSIS — E785 Hyperlipidemia, unspecified: Secondary | ICD-10-CM

## 2021-02-21 MED ORDER — PITAVASTATIN CALCIUM 2 MG PO TABS: 2.0000 mg | ORAL_TABLET | Freq: Every day | ORAL | 5 refills | Status: DC

## 2021-02-21 NOTE — Telephone Encounter (Signed)
-----   Message from Dietrich Pates V, MD sent at 02/20/2021  4:04 PM EDT ----- Called patient    SHe did not tolerate Crestor    Stopped    LDL is very high     I would recomm livalo 2 mg     Check lipomed and Lp(a) , ApoB in 2 months with LFTs [facility]

## 2021-02-21 NOTE — Telephone Encounter (Signed)
Penny Riffle, MD  Lendon Ka, RN Reviewed monitor   No afib detected    SR   Very short burst of SVT (21 sec) that was not afib  Stop Eliquis    Keep track   If has more palpitations contact office      Added Crestor as drug intolerance. Discontinued Eliquis [facility] Spoke w patient and arranged labs for in 2 months.   She felt her heart race for few seconds yesterday evening. She will keep an eye on this and call if becomes more frequent.

## 2021-03-03 ENCOUNTER — Ambulatory Visit: Payer: Medicare Other | Attending: Internal Medicine | Admitting: Audiologist

## 2021-03-03 ENCOUNTER — Other Ambulatory Visit: Payer: Self-pay

## 2021-03-03 DIAGNOSIS — H903 Sensorineural hearing loss, bilateral: Secondary | ICD-10-CM | POA: Diagnosis not present

## 2021-03-03 NOTE — Procedures (Signed)
  [facility] [address] [place], [place]  [address] [phone]  AUDIOLOGICAL  EVALUATION  NAME: Penny Barnett     DOB:   Aug 03, 1950      MRN: [id number]                                                                                     DATE: 03/03/2021     REFERENT: Madelin Headings, MD STATUS: Outpatient DIAGNOSIS: Sensorineural Hearing Loss Bilateral    History: Penny Barnett was seen for an audiological evaluation.  Penny Barnett is receiving a hearing evaluation due to concerns for increased difficulty hearing due to masks and plastic barriers. Penny Barnett has difficulty hearing in background noise, crowds, and when people are at a distance. This difficulty began gradually over the last three years. No pain or pressure reported in either ear. No tinnitus present in either ear. Penny Barnett denies noise exposure. Her goal today is to have her hearing checked due to her saying 'huh' all the time.  Medical history negative for a condition which is a risk factor for hearing loss. No other relevant case history reported.    Evaluation:  Otoscopy showed a clear view of the tympanic membranes, bilaterally Tympanometry results were consistent with normal middle ear pressure, bilaterally  Audiometric testing was completed using conventional audiometry with insert transducer. Speech Recognition Thresholds were consistent with pure tone averages. Word Recognition was excellent at conversation and an elevated level. Pure tone thresholds show normal sloping to moderate hearing loss in both ears. Test results are consistent with mild hearing loss at most frequencies.   Results:  The test results were reviewed with Penny Barnett, she was counseled on the nature and degree of her hearing loss. She was provided with several copies of her audiogram that illustrate her degree of hearing loss in both ears. Her hearing loss is in the high frequencies preventing Penny Barnett from hearing high  frequency consonants such as /s/ /sh/ /f/ /t/ and /th/. These sounds help differentiate the words she hears. Without these sounds, speech is muffled and unclear unless someone is face to face within 5 feet without a mask. Penny Barnett's ability to understand random words a conversational level is still at or above 90%, do not recommend hearing aids at this time. I do recommend she start having her hearing checked annually.   Recommendations: 1.   Annual audiometric testing recommended to monitor hearing loss.   Ammie Ferrier  Audiologist, Au.D., CCC-A 03/03/2021  10:38 AM  Cc: Madelin Headings, MD

## 2021-03-13 ENCOUNTER — Other Ambulatory Visit: Payer: Self-pay | Admitting: Internal Medicine

## 2021-03-25 ENCOUNTER — Ambulatory Visit: Payer: Medicare Other | Admitting: Pulmonary Disease

## 2021-03-25 ENCOUNTER — Other Ambulatory Visit: Payer: Self-pay

## 2021-03-25 ENCOUNTER — Encounter: Payer: Self-pay | Admitting: Pulmonary Disease

## 2021-03-25 ENCOUNTER — Ambulatory Visit (INDEPENDENT_AMBULATORY_CARE_PROVIDER_SITE_OTHER): Payer: Medicare Other

## 2021-03-25 VITALS — BP 118/86 | HR 60 | Temp 97.8°F | Ht 65.0 in | Wt 174.0 lb

## 2021-03-25 DIAGNOSIS — Z8616 Personal history of COVID-19: Secondary | ICD-10-CM

## 2021-03-25 DIAGNOSIS — R059 Cough, unspecified: Secondary | ICD-10-CM | POA: Diagnosis not present

## 2021-03-25 DIAGNOSIS — R053 Chronic cough: Secondary | ICD-10-CM

## 2021-03-25 NOTE — Progress Notes (Signed)
[facility]  Chief Complaint  Patient presents with   Consult    Asthma act score completed, looking to use a different inhaler Hx of covid    Past Surgical History:  She  has a past surgical history that includes Cataract extraction (Bilateral, 10/14/2010); Shoulder surgery (Bilateral); TEE without cardioversion (N/A, 01/03/2015); Cardioversion (N/A, 01/03/2015); Tonsillectomy; Colonoscopy (02/2011); and Cardioversion (N/A, 08/06/2020).  Past Medical History:  COVID 03 May 2020, Atrial fibrillation, Allergies, Anemia, Cataract, GERD, HLD, HTN, Nephrolithiasis  Constitutional:  BP 118/86 (BP Location: Left Arm, Cuff Size: Normal)   Pulse 60   Temp 97.8 F (36.6 C) (Oral)   Ht 5\' 5"  (1.651 m)   Wt 174 lb (78.9 kg)   SpO2 97%   BMI 28.96 kg/m   Brief Summary:  Penny Barnett is a 70 y.o. female former smoker with cough.      Subjective:   She was diagnosed with asthma as a child.  She previously was seen by Dr 61 with allergy/asthma.  She was on allergy shots in the 1990s.  She started smoking in 1966 and quit in the 1986.  She smoked 1 ppd.  Her grandfather had asthma.  She has allergies to cats, and had a bad reaction that lander her in the hospital in the 1990's. She has pet dog, and raised horses.  She was told she is allergic to penicillin when she was a child, but doesn't remember the kind of reaction she had.  She denies allergies to other foods or medicine.  She was hospitalized in November to December 2021 with COVID 19 pneumonia.  She was sent home with supplemental oxygen, but not longer uses this.  She has treated with MAB infusion prior to admission.  While in hospital she was treated with steroids, remdesivir, and baricitinab.  She currently denies sinus congestion, itchy eyes, sneezing spells, wheeze, chest tightness.  She gets winded walking up stairs, but does okay with usual activity on level ground.  No issues  with her breathing while asleep.  She uses flovent twice per day, and has been using this since the 1990s.  She uses albuterol twice per day as a matter of routine.  She gets more trouble with allergies and asthma in the Spring time with pollen exposure.  She is from 10-11-1970, but has lived in [place] for about 50 years.  No history of tuberculosis exposure.   Her chest xray from 10/02/20 showed mild cardiomegaly and b/l interstitial changes.  Physical Exam:   Appearance - well kempt   ENMT - no sinus tenderness, no oral exudate, no LAN, Mallampati 3 airway, no stridor  Respiratory - equal breath sounds bilaterally, no wheezing or rales  CV - s1s2 regular rate and rhythm, no murmurs  Ext - no clubbing, no edema  Skin - no rashes  Psych - normal mood and affect   Pulmonary testing:  PFT 02/11/17 >> FEV1 2.11 (87%), FEV1% 82, TLC 5.27 (101%), DLCO 86%  Chest Imaging:  CT angio chest 12/30/14 >> mild mosaic pattern  Cardiac Tests:  Echo 05/17/20 >> EF 60 to 65%, mild MR  Social History:  She  reports that she quit smoking about 36 years ago. Her smoking use included cigarettes. She has a 20.00 pack-year smoking history. She has never used smokeless tobacco. She reports that she does not drink alcohol and does not use drugs.  Family History:  Her family history includes Alcohol abuse in her  father; Bladder Cancer in her mother; Breast cancer in her maternal grandmother and mother; Uterine cancer in her sister.    Discussion:  She has history of allergic asthma.  She had COVID 19 pneumonia in November/December 2021.  Her chest xray from April 2022 showed changes suggestive of post-COVID ILD.  She has intermittent cough, but otherwise seems to have stable respiratory status.  Assessment/Plan:   Allergic asthma. 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 - will arrange for pulmonary function test and chest xray  Atrial fibrillation. - developed in setting of COVID 19 infection - followed by Dr. Dietrich Pates with [facility]  Time Spent Involved in Patient Care on Day of Examination:  46 minutes  Follow up:   Patient Instructions  Chest xray today  Will arrange for pulmonary function test and FeNO breathing test  Follow up in 4 weeks with Dr. Craige Cotta or Nurse Practitioner  Medication List:   Allergies as of 03/25/2021       Reactions   Crestor [rosuvastatin]    Penicillins Other (See Comments)   From childhood- reaction not recalled        Medication List        Accurate as of March 25, 2021 11:12 AM. If you have any questions, ask your nurse or doctor.          albuterol 108 (90 Base) MCG/ACT inhaler Commonly known as: VENTOLIN HFA INHALE 2 PUFFS INTO THE LUNGS EVERY 6 HOURS AS NEEDED FOR WHEEZING OR SHORTNESS OF BREATH   bisoprolol 5 MG tablet Commonly known as: ZEBETA Take 1 tablet (5 mg total) by mouth daily.   Co Q 10 100 MG Caps Take 100 mg by mouth daily.   diclofenac Sodium 1 % Gel Commonly known as: VOLTAREN Apply 2 g topically daily as needed (Pain).   Flovent HFA 110 MCG/ACT inhaler Generic drug: fluticasone INHALE 1-2 PUFFS INTO THE LUNGS 2 (TWO) TIMES DAILY   Klor-Con M20 20 MEQ tablet Generic drug: potassium chloride SA TAKE 1 TABLET BY MOUTH EVERY DAY   montelukast 10 MG tablet Commonly known as: SINGULAIR TAKE 1 TABLET BY MOUTH EVERY DAY IN THE MORNING   multivitamin with minerals tablet Take 1 tablet by mouth daily. With Iron   omeprazole 20 MG capsule Commonly known as: PRILOSEC Take 20 mg by mouth daily.   Pitavastatin Calcium 2 MG Tabs Take 1 tablet (2 mg total) by mouth daily.   PROBIOTIC  DAILY PO Take 1 capsule by mouth daily.   triamterene-hydrochlorothiazide 37.5-25 MG tablet Commonly known as: MAXZIDE-25 TAKE 1/2 TABLET BY MOUTH EVERY DAY   vitamin C 500 MG tablet Commonly known as: ASCORBIC ACID Take 500 mg by mouth daily.   Vitamin D-3 125 MCG (5000 UT) Tabs Take 5,000 Units by mouth daily.   zinc gluconate 50 MG tablet Take 50 mg by mouth daily.        Signature:  Coralyn Helling, MD [facility] Pager - [phone] 03/25/2021, 11:12 AM

## 2021-03-25 NOTE — Patient Instructions (Signed)
Chest xray today  Will arrange for pulmonary function test and FeNO breathing test  Follow up in 4 weeks with Dr. Craige Cotta or Nurse Practitioner

## 2021-04-11 MED ORDER — ALBUTEROL SULFATE HFA 108 (90 BASE) MCG/ACT IN AERS: 2.0000 | INHALATION_SPRAY | Freq: Four times a day (QID) | RESPIRATORY_TRACT | 0 refills | Status: DC | PRN

## 2021-04-11 MED ORDER — FLUTICASONE PROPIONATE HFA 110 MCG/ACT IN AERO: 1.0000 | INHALATION_SPRAY | Freq: Two times a day (BID) | RESPIRATORY_TRACT | 2 refills | Status: DC

## 2021-04-14 MED ORDER — ATROVENT HFA 17 MCG/ACT IN AERS: 2.0000 | INHALATION_SPRAY | Freq: Four times a day (QID) | RESPIRATORY_TRACT | 3 refills | Status: DC | PRN

## 2021-04-14 NOTE — Addendum Note (Signed)
Addended by: Christy Sartorius on: 04/14/2021 01:22 PM   Modules accepted: Orders

## 2021-04-14 NOTE — Telephone Encounter (Signed)
Yes ok to refill atrovent hfa   x 3

## 2021-04-16 ENCOUNTER — Other Ambulatory Visit: Payer: Self-pay | Admitting: Internal Medicine

## 2021-04-22 ENCOUNTER — Other Ambulatory Visit: Payer: Self-pay

## 2021-04-22 ENCOUNTER — Other Ambulatory Visit: Payer: Medicare Other | Admitting: *Deleted

## 2021-04-22 DIAGNOSIS — E785 Hyperlipidemia, unspecified: Secondary | ICD-10-CM

## 2021-04-25 LAB — HEPATIC FUNCTION PANEL
ALT: 19 IU/L (ref 0–32)
AST: 17 IU/L (ref 0–40)
Albumin: 4.5 g/dL (ref 3.7–4.7)
Alkaline Phosphatase: 126 IU/L — ABNORMAL HIGH (ref 44–121)
Bilirubin Total: 0.2 mg/dL (ref 0.0–1.2)
Bilirubin, Direct: <0.1 mg/dL (ref 0.00–0.40)
Total Protein: 7 g/dL (ref 6.0–8.5)

## 2021-04-25 LAB — NMR, LIPOPROFILE
Cholesterol, Total: 183 mg/dL (ref 100–199)
HDL Particle Number: 39.7 umol/L (ref >=30.5)
HDL-C: 61 mg/dL (ref >39)
LDL Particle Number: 1425 nmol/L — ABNORMAL HIGH (ref <1000)
LDL Size: 20.2 nm — ABNORMAL LOW (ref >20.5)
LDL-C (NIH Calc): 100 mg/dL — ABNORMAL HIGH (ref 0–99)
LP-IR Score: 49 — ABNORMAL HIGH (ref <=45)
Small LDL Particle Number: 895 nmol/L — ABNORMAL HIGH (ref <=527)
Triglycerides: 123 mg/dL (ref 0–149)

## 2021-04-25 LAB — APOLIPOPROTEIN B: Apolipoprotein B: 88 mg/dL (ref <90)

## 2021-04-25 LAB — LIPOPROTEIN A (LPA): Lipoprotein (a): 14.8 nmol/L (ref <75.0)

## 2021-05-12 ENCOUNTER — Ambulatory Visit: Payer: Medicare Other | Admitting: Pulmonary Disease

## 2021-05-13 ENCOUNTER — Ambulatory Visit: Payer: Medicare Other | Admitting: Pulmonary Disease

## 2021-05-28 ENCOUNTER — Encounter: Payer: Self-pay | Admitting: Internal Medicine

## 2021-05-28 DIAGNOSIS — M722 Plantar fascial fibromatosis: Secondary | ICD-10-CM

## 2021-06-02 NOTE — Telephone Encounter (Signed)
Please refer her to sports medicine

## 2021-06-03 ENCOUNTER — Encounter: Payer: Self-pay | Admitting: Internal Medicine

## 2021-06-05 NOTE — Telephone Encounter (Signed)
Lets  refer to sports medicine   for evaluation  of heel pain  may be  PF.

## 2021-06-20 ENCOUNTER — Telehealth: Payer: Self-pay | Admitting: Pulmonary Disease

## 2021-06-20 ENCOUNTER — Other Ambulatory Visit: Payer: Self-pay | Admitting: Pulmonary Disease

## 2021-06-20 LAB — SARS CORONAVIRUS 2 (TAT 6-24 HRS): SARS Coronavirus 2: POSITIVE — AB

## 2021-06-20 NOTE — Telephone Encounter (Signed)
Called and spoke to patient she states she is surprised  she is covid positive and has no symptoms. Cancelled PFT for 06/24/2021 and rescheduled for Feb. 2023

## 2021-06-20 NOTE — Telephone Encounter (Signed)
COVID test prior to getting breathing test is positive.  Attempted to contact patient at listed number, but no answer.  Will route to my nurse to follow up with patient.  If she is symptomatic (worse cough, short of breath, headache, fever, fatigue), then can send script for molnupiravir 800 mg bid for 12 days.

## 2021-06-20 NOTE — Telephone Encounter (Signed)
Atc patient to let her know she WILL need to be retested. She voiced understanding. Nothing further needed. Will place in appt notes that she is aware.

## 2021-06-23 ENCOUNTER — Encounter: Payer: Self-pay | Admitting: Internal Medicine

## 2021-06-23 NOTE — Telephone Encounter (Signed)
From what I can tell you had the test done because it was a screening test before a lung function test. Glad you are  not sick with this.  You are probably minimally contagious. Based on the information given there is no one way to answer the question.  YOu need to ask your work what their guidelines are  but usually  if no sx  develop 5 days isolation and mask on return for the total 10 days.  Ask the pulmonary team  their protocol for doing lung testing .   If need more information  make  virtual visit

## 2021-07-25 ENCOUNTER — Other Ambulatory Visit: Payer: Self-pay | Admitting: Pulmonary Disease

## 2021-07-25 LAB — SARS CORONAVIRUS 2 (TAT 6-24 HRS): SARS Coronavirus 2: NEGATIVE

## 2021-07-29 ENCOUNTER — Encounter: Payer: Self-pay | Admitting: Internal Medicine

## 2021-07-29 ENCOUNTER — Other Ambulatory Visit: Payer: Self-pay

## 2021-07-29 ENCOUNTER — Ambulatory Visit (INDEPENDENT_AMBULATORY_CARE_PROVIDER_SITE_OTHER): Payer: Medicare Other | Admitting: Pulmonary Disease

## 2021-07-29 DIAGNOSIS — R053 Chronic cough: Secondary | ICD-10-CM

## 2021-07-29 LAB — PULMONARY FUNCTION TEST
DL/VA % pred: 106 %
DL/VA: 4.34 ml/min/mmHg/L
DLCO cor % pred: 90 %
DLCO cor: 17.99 ml/min/mmHg
DLCO unc % pred: 90 %
DLCO unc: 17.99 ml/min/mmHg
FEF 25-75 Post: 1.96 L/sec
FEF 25-75 Pre: 1.93 L/sec
FEF2575-%Change-Post: 1 %
FEF2575-%Pred-Post: 105 %
FEF2575-%Pred-Pre: 103 %
FEV1-%Change-Post: 1 %
FEV1-%Pred-Post: 81 %
FEV1-%Pred-Pre: 80 %
FEV1-Post: 1.87 L
FEV1-Pre: 1.85 L
FEV1FVC-%Change-Post: 1 %
FEV1FVC-%Pred-Pre: 108 %
FEV6-%Change-Post: 0 %
FEV6-%Pred-Post: 77 %
FEV6-%Pred-Pre: 77 %
FEV6-Post: 2.25 L
FEV6-Pre: 2.25 L
FEV6FVC-%Pred-Post: 104 %
FEV6FVC-%Pred-Pre: 104 %
FVC-%Change-Post: 0 %
FVC-%Pred-Post: 74 %
FVC-%Pred-Pre: 74 %
FVC-Post: 2.26 L
FVC-Pre: 2.26 L
Post FEV1/FVC ratio: 83 %
Post FEV6/FVC ratio: 100 %
Pre FEV1/FVC ratio: 82 %
Pre FEV6/FVC Ratio: 100 %
RV % pred: 96 %
RV: 2.2 L
TLC % pred: 85 %
TLC: 4.43 L

## 2021-07-29 NOTE — Progress Notes (Signed)
PFT done today. 

## 2021-07-30 MED ORDER — BISOPROLOL FUMARATE 5 MG PO TABS: 5.0000 mg | ORAL_TABLET | Freq: Every day | ORAL | 0 refills | Status: DC

## 2021-08-04 ENCOUNTER — Other Ambulatory Visit: Payer: Self-pay

## 2021-08-04 ENCOUNTER — Ambulatory Visit: Payer: Medicare Other | Admitting: Primary Care

## 2021-08-04 ENCOUNTER — Encounter: Payer: Self-pay | Admitting: Primary Care

## 2021-08-04 VITALS — BP 128/60 | HR 77 | Temp 98.1°F | Wt 178.8 lb

## 2021-08-04 DIAGNOSIS — J45909 Unspecified asthma, uncomplicated: Secondary | ICD-10-CM

## 2021-08-04 DIAGNOSIS — R053 Chronic cough: Secondary | ICD-10-CM

## 2021-08-04 DIAGNOSIS — U071 COVID-19: Secondary | ICD-10-CM

## 2021-08-04 DIAGNOSIS — J1282 Pneumonia due to coronavirus disease 2019: Secondary | ICD-10-CM | POA: Diagnosis not present

## 2021-08-04 LAB — NITRIC OXIDE: FeNO level (ppb): 27

## 2021-08-04 MED ORDER — ATROVENT HFA 17 MCG/ACT IN AERS: 2.0000 | INHALATION_SPRAY | Freq: Two times a day (BID) | RESPIRATORY_TRACT | 5 refills | Status: DC

## 2021-08-04 MED ORDER — FLUTICASONE PROPIONATE HFA 110 MCG/ACT IN AERO: 1.0000 | INHALATION_SPRAY | Freq: Two times a day (BID) | RESPIRATORY_TRACT | 5 refills | Status: DC

## 2021-08-04 MED ORDER — ALBUTEROL SULFATE HFA 108 (90 BASE) MCG/ACT IN AERS: 2.0000 | INHALATION_SPRAY | Freq: Four times a day (QID) | RESPIRATORY_TRACT | 0 refills | Status: DC | PRN

## 2021-08-04 NOTE — Assessment & Plan Note (Signed)
-   Stable; Asthma symptoms are well controlled on scheduled Flovent and Atrovent. RARE SABA use. ACT 22. FENO 27. Pulmonary function testing showed normal spirometry, lung volumes and diffusion capacity. No significant BD response. FEV1 1.87 (81%), ratio 83, TLC 85%, DLCOunc 17.99 (90%). Compared to Compared to 2018 FEV1 is relatively stable, declined 6%. No changed today, follow-up in 6 months or sooner if needed.

## 2021-08-04 NOTE — Assessment & Plan Note (Addendum)
-   Hospitalized for covid-19 pneumonia in December 2021. Test positive for covid second time in January 2023 prior to pulmonary function testing. No residual symptoms.

## 2021-08-04 NOTE — Patient Instructions (Addendum)
Breathing test showed normal spirometry, no evidence of obstructive or restrictive airway disease. Normal lung volumes and diffusion capacity. No significant BD response.   Lung function minimally declined in the last 5 years, overall stable  Recommendations: Continue Flovent two puffs morning and evening Atrovent 2 puffs morning and evening Use albuterol (ventolin) 2 puffs every 4-6 hours for breakthrough shortness of breath/wheezing  Continue Singulair 10mg  at bedtime   Follow-up:  6 months with Dr. or sooner if needed

## 2021-08-04 NOTE — Progress Notes (Signed)
@Patient  ID: Penny Barnett, female    DOB: 12/03/1950, 71 y.o.   MRN: [id number]  Chief Complaint  Patient presents with   Follow-up    PFT results and Feno 27    Referring provider: Madelin Headings, MD  HPI: 71 year old female,   Previous [facility] pulmonary encounter: 03/25/21- Dr. Craige Cotta  She was diagnosed with asthma as a child.  She previously was seen by Dr Erath Callas with allergy/asthma.  She was on allergy shots in the 1990s.  She started smoking in 1966 and quit in the 1986.  She smoked 1 ppd.  Her grandfather had asthma.  She has allergies to cats, and had a bad reaction that lander her in the hospital in the 1990's. She has pet dog, and raised horses.  She was told she is allergic to penicillin when she was a child, but doesn't remember the kind of reaction she had.  She denies allergies to other foods or medicine.  She was hospitalized in November to December 2021 with COVID 19 pneumonia.  She was sent home with supplemental oxygen, but not longer uses this.  She has treated with MAB infusion prior to admission.  While in hospital she was treated with steroids, remdesivir, and baricitinab.  She currently denies sinus congestion, itchy eyes, sneezing spells, wheeze, chest tightness.  She gets winded walking up stairs, but does okay with usual activity on level ground.  No issues with her breathing while asleep.  She uses flovent twice per day, and has been using this since the 1990s.  She uses albuterol twice per day as a matter of routine.  She gets more trouble with allergies and asthma in the Spring time with pollen exposure.  She is from [place], but has lived in [place] for about 50 years.  No history of tuberculosis exposure.   Her chest xray from 10/02/20 showed mild cardiomegaly and b/l interstitial changes.   08/04/2021- Interim hx  Patient presents today for 3-4 month follow-up. She had covid in January 2023, PFTs were done on 07/29/21. She was hospitalized d.t covid  pneumonia in December 2021, tested positive for second time in January 2023 prior to pulmonary function testing. She has been doing pretty good lately. No significant shortness of breath, cough, chest tightness or wheezing. She works three days a week and walks 3-5 miles several days a week. She uses Flovent and Atrovent twice daily. She was solely using Flovent but feels her breahing is better with scheduled Atrovent.  FENO 27. ACT 22.   PFTs 07/29/21>> FVC 2.26 (74%), FEV1 1.87 (81%), ratio 83, TLC 85%, DLCOunc 17.99 (90%)  Spirometry normal. Normal lung volumes and diffusion capacity. No significant BD response.  Flow vol loop suggestive of small airway obstruction  Allergies  Allergen Reactions   Crestor [Rosuvastatin]    Penicillins Other (See Comments)    From childhood- reaction not recalled    Immunization History  Administered Date(s) Administered   Fluad Quad(high Dose 65+) 03/11/2019   Influenza Split 04/13/2011   Influenza, High Dose Seasonal PF 02/12/2015, 04/05/2018   Influenza,inj,Quad PF,6+ Mos 03/29/2017   Pneumococcal Conjugate-13 10/16/2013, 10/23/2014   Pneumococcal Polysaccharide-23 10/25/2015    Past Medical History:  Diagnosis Date   Allergy    Anemia    Arthritis    hands   Asthma    as a child and then attack 1996  poss allergic to cat hair. ventilated x 2 . saw Dr [place] Callas then .  Atrial flutter (HCC)    a. 12/2014 Echo: EF 60-65%, no rwma, mild MR;  b. S/P TEE/DCCV;  c. CHA2DS2VASc = 3-->Eliquis.   Cataract    Had surgery to remove   GERD (gastroesophageal reflux disease)    2000 taking otc prevacid   evey day.    Hyperlipidemia    Hypertension     3 meds controlled   Kidney stone    passed stone - no surgery required    Tobacco History: Social History   Tobacco Use  Smoking Status Former   Packs/day: 1.00   Years: 20.00   Pack years: 20.00   Types: Cigarettes   Quit date: 06/21/1984   Years since quitting: 37.1  Smokeless Tobacco Never    Counseling given: Not Answered   Outpatient Medications Prior to Visit  Medication Sig Dispense Refill   bisoprolol (ZEBETA) 5 MG tablet Take 1 tablet (5 mg total) by mouth daily. Please make yearly appt with Dr Tenny Craw for May 2023 for future refills. Thank you 1st attempt 90 tablet 0   Cholecalciferol (VITAMIN D-3) 125 MCG (5000 UT) TABS Take 5,000 Units by mouth daily.     Coenzyme Q10 (CO Q 10) 100 MG CAPS Take 100 mg by mouth daily.     diclofenac Sodium (VOLTAREN) 1 % GEL Apply 2 g topically daily as needed (Pain).     KLOR-CON M20 20 MEQ tablet TAKE 1 TABLET BY MOUTH EVERY DAY 90 tablet 2   montelukast (SINGULAIR) 10 MG tablet TAKE 1 TABLET BY MOUTH EVERY DAY IN THE MORNING 90 tablet 2   Multiple Vitamins-Minerals (MULTIVITAMIN WITH MINERALS) tablet Take 1 tablet by mouth daily. With Iron     omeprazole (PRILOSEC) 20 MG capsule Take 20 mg by mouth daily.     Pitavastatin Calcium 2 MG TABS Take 1 tablet (2 mg total) by mouth daily. 30 tablet 5   Probiotic Product (PROBIOTIC DAILY PO) Take 1 capsule by mouth daily.     triamterene-hydrochlorothiazide (MAXZIDE-25) 37.5-25 MG tablet TAKE 1/2 TABLET BY MOUTH EVERY DAY 45 tablet 3   vitamin C (ASCORBIC ACID) 500 MG tablet Take 500 mg by mouth daily.     zinc gluconate 50 MG tablet Take 50 mg by mouth daily.     albuterol (VENTOLIN HFA) 108 (90 Base) MCG/ACT inhaler Inhale 2 puffs into the lungs every 6 (six) hours as needed for wheezing or shortness of breath. 54 g 0   fluticasone (FLOVENT HFA) 110 MCG/ACT inhaler Inhale 1-2 puffs into the lungs 2 (two) times daily. 12 each 2   ipratropium (ATROVENT HFA) 17 MCG/ACT inhaler Inhale 2 puffs into the lungs every 6 (six) hours as needed for wheezing. 1 each 3   No facility-administered medications prior to visit.   Review of Systems  Review of Systems  Constitutional: Negative.   HENT: Negative.    Respiratory: Negative.    Cardiovascular: Negative.     Physical Exam  BP 128/60 (BP  Location: Left Arm, Cuff Size: Normal)    Pulse 77    Temp 98.1 F (36.7 C) (Oral)    Wt 178 lb 12.8 oz (81.1 kg)    SpO2 95%    BMI 29.75 kg/m  Physical Exam Constitutional:      Appearance: Normal appearance.  HENT:     Head: Normocephalic and atraumatic.     Mouth/Throat:     Mouth: Mucous membranes are moist.     Pharynx: Oropharynx is clear.  Cardiovascular:  Rate and Rhythm: Normal rate and regular rhythm.  Pulmonary:     Effort: Pulmonary effort is normal.     Breath sounds: Normal breath sounds. No wheezing or rhonchi.  Musculoskeletal:        General: Normal range of motion.  Skin:    General: Skin is warm and dry.  Neurological:     General: No focal deficit present.     Mental Status: She is alert and oriented to person, place, and time. Mental status is at baseline.  Psychiatric:        Mood and Affect: Mood normal.        Behavior: Behavior normal.        Thought Content: Thought content normal.        Judgment: Judgment normal.     Lab Results:  CBC    Component Value Date/Time   WBC 5.2 02/04/2021 1147   RBC 4.64 02/04/2021 1147   HGB 13.2 02/04/2021 1147   HGB 12.1 07/29/2020 1208   HCT 39.4 02/04/2021 1147   HCT 36.4 07/29/2020 1208   PLT 226.0 02/04/2021 1147   PLT 212 07/29/2020 1208   MCV 84.9 02/04/2021 1147   MCV 85 07/29/2020 1208   MCH 28.4 07/29/2020 1208   MCH 27.8 06/05/2020 1225   MCHC 33.4 02/04/2021 1147   RDW 14.9 02/04/2021 1147   RDW 14.8 07/29/2020 1208   LYMPHSABS 1.0 02/04/2021 1147   MONOABS 0.4 02/04/2021 1147   EOSABS 0.0 02/04/2021 1147   BASOSABS 0.0 02/04/2021 1147    BMET    Component Value Date/Time   NA 138 02/04/2021 1147   NA 140 07/29/2020 1208   K 3.4 (L) 02/04/2021 1147   CL 97 02/04/2021 1147   CO2 29 02/04/2021 1147   GLUCOSE 100 (H) 02/04/2021 1147   BUN 25 (H) 02/04/2021 1147   BUN 18 07/29/2020 1208   CREATININE 0.72 02/04/2021 1147   CREATININE 0.62 06/05/2020 1225   CALCIUM 9.6 02/04/2021  1147   GFRNONAA 87 07/29/2020 1208   GFRNONAA 91 06/05/2020 1225   GFRAA 101 07/29/2020 1208   GFRAA 105 06/05/2020 1225    BNP    Component Value Date/Time   BNP 288.3 (H) 05/18/2020 0238    ProBNP    Component Value Date/Time   PROBNP 420.0 (H) 10/02/2020 1403    Imaging: No results found.   Assessment & Plan:   Asthma - Stable; Asthma symptoms are well controlled on scheduled Flovent and Atrovent. RARE SABA use. ACT 22. FENO 27. Pulmonary function testing showed normal spirometry, lung volumes and diffusion capacity. No significant BD response. FEV1 1.87 (81%), ratio 83, TLC 85%, DLCOunc 17.99 (90%). Compared to Compared to 2018 FEV1 is relatively stable, declined 6%. No changed today, follow-up in 6 months or sooner if needed.    Pneumonia due to COVID-19 virus - Hospitalized for covid-19 pneumonia in December 2021. Test positive for covid second time in January 2023 prior to pulmonary function testing. No residual symptoms.      Glenford Bayley, NP 08/04/2021

## 2021-08-04 NOTE — Progress Notes (Signed)
Reviewed and agree with assessment/plan. ° ° °Doreena Maulden, MD °[place] [facility] °08/04/2021, 5:06 PM °Pager:  [phone] ° °

## 2021-08-06 ENCOUNTER — Encounter: Payer: Self-pay | Admitting: Internal Medicine

## 2021-08-06 DIAGNOSIS — I429 Cardiomyopathy, unspecified: Secondary | ICD-10-CM

## 2021-08-06 DIAGNOSIS — E785 Hyperlipidemia, unspecified: Secondary | ICD-10-CM

## 2021-08-08 MED ORDER — ALBUTEROL SULFATE HFA 108 (90 BASE) MCG/ACT IN AERS: 2.0000 | INHALATION_SPRAY | Freq: Four times a day (QID) | RESPIRATORY_TRACT | 6 refills | Status: DC | PRN

## 2021-08-08 NOTE — Telephone Encounter (Signed)
Yes

## 2021-08-08 NOTE — Telephone Encounter (Signed)
Per Elizabeth's recommendations Ventolin was changed to Albuterol HFA. Nothing further needed at this time.

## 2021-08-08 NOTE — Telephone Encounter (Signed)
Penny Barnett may we change pt's Ventolin order to Albuterol HFA?

## 2021-08-11 ENCOUNTER — Other Ambulatory Visit: Payer: Self-pay | Admitting: *Deleted

## 2021-08-11 MED ORDER — ALBUTEROL SULFATE HFA 108 (90 BASE) MCG/ACT IN AERS: 2.0000 | INHALATION_SPRAY | Freq: Four times a day (QID) | RESPIRATORY_TRACT | 3 refills | Status: DC | PRN

## 2021-08-18 ENCOUNTER — Encounter: Payer: Self-pay | Admitting: Dermatology

## 2021-08-18 ENCOUNTER — Other Ambulatory Visit: Payer: Self-pay

## 2021-08-18 ENCOUNTER — Ambulatory Visit: Payer: Medicare Other | Admitting: Dermatology

## 2021-08-18 DIAGNOSIS — Z1283 Encounter for screening for malignant neoplasm of skin: Secondary | ICD-10-CM | POA: Diagnosis not present

## 2021-08-18 DIAGNOSIS — L570 Actinic keratosis: Secondary | ICD-10-CM

## 2021-08-18 DIAGNOSIS — L820 Inflamed seborrheic keratosis: Secondary | ICD-10-CM

## 2021-08-25 ENCOUNTER — Encounter: Payer: Self-pay | Admitting: Dermatology

## 2021-08-25 NOTE — Progress Notes (Signed)
? ?  New Patient ?  ?Subjective  ?Penny Barnett is a 71 y.o. female who presents for the following: Skin Problem (Lesion on left cheek x unknown. Lesion on back x unknown - itching. ). ? ?No skin check, patient aware of spots on cheek and back. ?Location:  ?Duration:  ?Quality:  ?Associated Signs/Symptoms: ?Modifying Factors:  ?Severity:  ?Timing: ?Context:  ? ? ?The following portions of the chart were reviewed this encounter and updated as appropriate:  Tobacco  Allergies  Meds  Problems  Med Hx  Surg Hx  Fam Hx   ?  ? ?Objective  ?Well appearing patient in no apparent distress; mood and affect are within normal limits. ?Waist up skin examination: No atypical pigmented lesions or nonmelanoma skin cancer ? ?Left Malar Cheek ?Gritty 5 mm pink crust ? ?Left Upper Back ?5 mm tan textured flat papule, compatible dermoscopy ? ? ? ?All skin waist up examined.  Areas beneath undergarments not examined. ? ? ?Assessment & Plan  ?AK (actinic keratosis) ?Left Malar Cheek ? ?Destruction of lesion - Left Malar Cheek ?Complexity: simple   ?Destruction method: cryotherapy   ?Informed consent: discussed and consent obtained   ?Timeout:  patient name, date of birth, surgical site, and procedure verified ?Lesion destroyed using liquid nitrogen: Yes   ?Cryotherapy cycles:  3 ?Outcome: patient tolerated procedure well with no complications   ? ?Seborrheic keratosis, inflamed ?Left Upper Back ? ?Leave if stable ? ?Encounter for screening for malignant neoplasm of skin ? ?Annual skin examination, encouraged to self examine twice annually. ? ? ?

## 2021-08-26 ENCOUNTER — Other Ambulatory Visit: Payer: Self-pay | Admitting: Internal Medicine

## 2021-08-27 ENCOUNTER — Other Ambulatory Visit: Payer: Self-pay

## 2021-08-27 ENCOUNTER — Ambulatory Visit (INDEPENDENT_AMBULATORY_CARE_PROVIDER_SITE_OTHER): Payer: Medicare Other

## 2021-08-27 VITALS — BP 122/64 | HR 78 | Temp 98.6°F | Ht 65.0 in | Wt 177.6 lb

## 2021-08-27 DIAGNOSIS — Z0000 Encounter for general adult medical examination without abnormal findings: Secondary | ICD-10-CM

## 2021-08-27 NOTE — Progress Notes (Signed)
Error

## 2021-08-27 NOTE — Patient Instructions (Addendum)
?[name] , ?Thank you for taking time to come for your Medicare Wellness Visit. I appreciate your ongoing commitment to your health goals. Please review the following plan we discussed and let me know if I can assist you in the future.  ? ?These are the goals we discussed: ? Goals   ? ?   Lose 25 lbs. (pt-stated)   ?   Would like to lose 20lbs  ?  ?   Patient Stated   ?   Will maintain this year  ?Would like to lose a few more weight ?Would like to get down below 150; ?Does not eat bread, potatoes etc ?Diet mayo  ?  ? ?  ?  ?This is a list of the screening recommended for you and due dates:  ?Health Maintenance  ?Topic Date Due  ? Flu Shot  09/12/2021*  ? COVID-19 Vaccine (1) 09/12/2021*  ? Zoster (Shingles) Vaccine (1 of 2) 11/27/2021*  ? Tetanus Vaccine  08/28/2022*  ? Mammogram  12/05/2021  ? Colon Cancer Screening  04/27/2026  ? Pneumonia Vaccine  Completed  ? DEXA scan (bone density measurement)  Completed  ? Hepatitis C Screening: USPSTF Recommendation to screen - Ages 42-79 yo.  Completed  ? HPV Vaccine  Aged Out  ?*Topic was postponed. The date shown is not the original due date.  ?  ?Advanced directives: No Patient deferred ? ?Conditions/risks identified: None ? ?Next appointment: Follow up in one year for your annual wellness visit  ? ? ?Preventive Care 45 Years and Older, Female ?Preventive care refers to lifestyle choices and visits with your health care provider that can promote health and wellness. ?What does preventive care include? ?A yearly physical exam. This is also called an annual well check. ?Dental exams once or twice a year. ?Routine eye exams. Ask your health care provider how often you should have your eyes checked. ?Personal lifestyle choices, including: ?Daily care of your teeth and gums. ?Regular physical activity. ?Eating a healthy diet. ?Avoiding tobacco and drug use. ?Limiting alcohol use. ?Practicing safe sex. ?Taking low-dose aspirin every day. ?Taking vitamin and mineral  supplements as recommended by your health care provider. ?What happens during an annual well check? ?The services and screenings done by your health care provider during your annual well check will depend on your age, overall health, lifestyle risk factors, and family history of disease. ?Counseling  ?Your health care provider may ask you questions about your: ?Alcohol use. ?Tobacco use. ?Drug use. ?Emotional well-being. ?Home and relationship well-being. ?Sexual activity. ?Eating habits. ?History of falls. ?Memory and ability to understand (cognition). ?Work and work Astronomer. ?Reproductive health. ?Screening  ?You may have the following tests or measurements: ?Height, weight, and BMI. ?Blood pressure. ?Lipid and cholesterol levels. These may be checked every 5 years, or more frequently if you are over 73 years old. ?Skin check. ?Lung cancer screening. You may have this screening every year starting at age 51 if you have a 30-pack-year history of smoking and currently smoke or have quit within the past 15 years. ?Fecal occult blood test (FOBT) of the stool. You may have this test every year starting at age 60. ?Flexible sigmoidoscopy or colonoscopy. You may have a sigmoidoscopy every 5 years or a colonoscopy every 10 years starting at age 12. ?Hepatitis C blood test. ?Hepatitis B blood test. ?Sexually transmitted disease (STD) testing. ?Diabetes screening. This is done by checking your blood sugar (glucose) after you have not eaten for a while (fasting). You may  have this done every 1-3 years. ?Bone density scan. This is done to screen for osteoporosis. You may have this done starting at age 16. ?Mammogram. This may be done every 1-2 years. Talk to your health care provider about how often you should have regular mammograms. ?Talk with your health care provider about your test results, treatment options, and if necessary, the need for more tests. ?Vaccines  ?Your health care provider may recommend certain  vaccines, such as: ?Influenza vaccine. This is recommended every year. ?Tetanus, diphtheria, and acellular pertussis (Tdap, Td) vaccine. You may need a Td booster every 10 years. ?Zoster vaccine. You may need this after age 68. ?Pneumococcal 13-valent conjugate (PCV13) vaccine. One dose is recommended after age 82. ?Pneumococcal polysaccharide (PPSV23) vaccine. One dose is recommended after age 77. ?Talk to your health care provider about which screenings and vaccines you need and how often you need them. ?This information is not intended to replace advice given to you by your health care provider. Make sure you discuss any questions you have with your health care provider. ?Document Released: 06/28/2015 Document Revised: 02/19/2016 Document Reviewed: 04/02/2015 ?Elsevier Interactive Patient Education ? 2017 [employer] ? ?Fall Prevention in the Home ?Falls can cause injuries. They can happen to people of all ages. There are many things you can do to make your home safe and to help prevent falls. ?What can I do on the outside of my home? ?Regularly fix the edges of walkways and driveways and fix any cracks. ?Remove anything that might make you trip as you walk through a door, such as a raised step or threshold. ?Trim any bushes or trees on the path to your home. ?Use bright outdoor lighting. ?Clear any walking paths of anything that might make someone trip, such as rocks or tools. ?Regularly check to see if handrails are loose or broken. Make sure that both sides of any steps have handrails. ?Any raised decks and porches should have guardrails on the edges. ?Have any leaves, snow, or ice cleared regularly. ?Use sand or salt on walking paths during winter. ?Clean up any spills in your garage right away. This includes oil or grease spills. ?What can I do in the bathroom? ?Use night lights. ?Install grab bars by the toilet and in the tub and shower. Do not use towel bars as grab bars. ?Use non-skid mats or decals in  the tub or shower. ?If you need to sit down in the shower, use a plastic, non-slip stool. ?Keep the floor dry. Clean up any water that spills on the floor as soon as it happens. ?Remove soap buildup in the tub or shower regularly. ?Attach bath mats securely with double-sided non-slip rug tape. ?Do not have throw rugs and other things on the floor that can make you trip. ?What can I do in the bedroom? ?Use night lights. ?Make sure that you have a light by your bed that is easy to reach. ?Do not use any sheets or blankets that are too big for your bed. They should not hang down onto the floor. ?Have a firm chair that has side arms. You can use this for support while you get dressed. ?Do not have throw rugs and other things on the floor that can make you trip. ?What can I do in the kitchen? ?Clean up any spills right away. ?Avoid walking on wet floors. ?Keep items that you use a lot in easy-to-reach places. ?If you need to reach something above you, use a strong step  stool that has a grab bar. ?Keep electrical cords out of the way. ?Do not use floor polish or wax that makes floors slippery. If you must use wax, use non-skid floor wax. ?Do not have throw rugs and other things on the floor that can make you trip. ?What can I do with my stairs? ?Do not leave any items on the stairs. ?Make sure that there are handrails on both sides of the stairs and use them. Fix handrails that are broken or loose. Make sure that handrails are as long as the stairways. ?Check any carpeting to make sure that it is firmly attached to the stairs. Fix any carpet that is loose or worn. ?Avoid having throw rugs at the top or bottom of the stairs. If you do have throw rugs, attach them to the floor with carpet tape. ?Make sure that you have a light switch at the top of the stairs and the bottom of the stairs. If you do not have them, ask someone to add them for you. ?What else can I do to help prevent falls? ?Wear shoes that: ?Do not have high  heels. ?Have rubber bottoms. ?Are comfortable and fit you well. ?Are closed at the toe. Do not wear sandals. ?If you use a stepladder: ?Make sure that it is fully opened. Do not climb a closed stepladder. ?Make

## 2021-08-27 NOTE — Progress Notes (Signed)
? ?Subjective:  ? Penny Barnett is a 71 y.o. female who presents for Medicare Annual (Subsequent) preventive examination. ? ?Review of Systems    ? ?   ?Objective:  ?  ?Today's Vitals  ? 08/27/21 1040  ?BP: 122/64  ?Pulse: 78  ?Temp: 98.6 ?F (37 ?C)  ?TempSrc: Oral  ?SpO2: 96%  ?Weight: 177 lb 9.6 oz (80.6 kg)  ?Height: 5\' 5"  (1.651 m)  ? ?Body mass index is 29.55 kg/m?. ? ?Advanced Directives 08/27/2021 08/26/2020 08/06/2020 05/17/2020 05/16/2020 12/29/2017 12/15/2016  ?Does Patient Have a Medical Advance Directive? No No No No No Yes No  ?Does patient want to make changes to medical advance directive? - Yes (MAU/Ambulatory/Procedural Areas - Information given) - - - - -  ?Would patient like information on creating a medical advance directive? No - Patient declined - No - Patient declined No - Patient declined - - No - Patient declined  ? ? ?Current Medications (verified) ?Outpatient Encounter Medications as of 08/27/2021  ?Medication Sig  ? albuterol (VENTOLIN HFA) 108 (90 Base) MCG/ACT inhaler Inhale 2 puffs into the lungs every 6 (six) hours as needed for wheezing or shortness of breath.  ? bisoprolol (ZEBETA) 5 MG tablet Take 1 tablet (5 mg total) by mouth daily. Please make yearly appt with Dr Tenny Crawoss for May 2023 for future refills. Thank you 1st attempt  ? Cholecalciferol (VITAMIN D-3) 125 MCG (5000 UT) TABS Take 5,000 Units by mouth daily.  ? Coenzyme Q10 (CO Q 10) 100 MG CAPS Take 100 mg by mouth daily.  ? diclofenac Sodium (VOLTAREN) 1 % GEL Apply 2 g topically daily as needed (Pain).  ? fluticasone (FLOVENT HFA) 110 MCG/ACT inhaler Inhale 1-2 puffs into the lungs 2 (two) times daily.  ? ipratropium (ATROVENT HFA) 17 MCG/ACT inhaler Inhale 2 puffs into the lungs in the morning and at bedtime.  ? KLOR-CON M20 20 MEQ tablet TAKE 1 TABLET BY MOUTH EVERY DAY  ? montelukast (SINGULAIR) 10 MG tablet TAKE 1 TABLET BY MOUTH EVERY DAY IN THE MORNING  ? Multiple Vitamins-Minerals (MULTIVITAMIN WITH MINERALS) tablet Take 1  tablet by mouth daily. With Iron  ? omeprazole (PRILOSEC) 20 MG capsule Take 20 mg by mouth daily.  ? Pitavastatin Calcium 2 MG TABS Take 1 tablet (2 mg total) by mouth daily.  ? Probiotic Product (PROBIOTIC DAILY PO) Take 1 capsule by mouth daily.  ? triamterene-hydrochlorothiazide (MAXZIDE-25) 37.5-25 MG tablet TAKE 1/2 TABLET BY MOUTH EVERY DAY  ? vitamin C (ASCORBIC ACID) 500 MG tablet Take 500 mg by mouth daily.  ? zinc gluconate 50 MG tablet Take 50 mg by mouth daily.  ? ?No facility-administered encounter medications on file as of 08/27/2021.  ? ? ?Allergies (verified) ?Crestor [rosuvastatin] and Penicillins  ? ?History: ?Past Medical History:  ?Diagnosis Date  ? Allergy   ? Anemia   ? Arthritis   ? hands  ? Asthma   ? as a child and then attack 1996  poss allergic to cat hair. ventilated x 2 . saw Dr [place] CallasSharma then .   ? Atrial flutter (HCC)   ? a. 12/2014 Echo: EF 60-65%, no rwma, mild MR;  b. S/P TEE/DCCV;  c. CHA2DS2VASc = 3-->Eliquis.  ? Cataract   ? Had surgery to remove  ? GERD (gastroesophageal reflux disease)   ? 2000 taking otc prevacid   evey day.   ? Hyperlipidemia   ? Hypertension   ?  3 meds controlled  ? Kidney stone   ?  passed stone - no surgery required  ? ?Past Surgical History:  ?Procedure Laterality Date  ? CARDIOVERSION N/A 01/03/2015  ? Procedure: CARDIOVERSION;  Surgeon: Pricilla Riffle, MD;  Location: [facility] ENDOSCOPY;  Service: Cardiovascular;  Laterality: N/A;  ? CARDIOVERSION N/A 08/06/2020  ? Procedure: CARDIOVERSION;  Surgeon: [name], MD;  Location: [facility] ENDOSCOPY;  Service: Cardiovascular;  Laterality: N/A;  ? CATARACT EXTRACTION Bilateral 10/14/2010  ? both eyes  ? COLONOSCOPY  02/2011  ? Arlyce Dice  ? SHOULDER SURGERY Bilateral   ? x 2 - frozen shoulder rt 2006, 2000 left   ? TEE WITHOUT CARDIOVERSION N/A 01/03/2015  ? Procedure: TRANSESOPHAGEAL ECHOCARDIOGRAM (TEE);  Surgeon: Pricilla Riffle, MD;  Location: [facility] ENDOSCOPY;  Service: Cardiovascular;  Laterality: N/A;  ? TONSILLECTOMY     ? ?Family History  ?Problem Relation Age of Onset  ? Breast cancer Mother   ? Bladder Cancer Mother   ? Uterine cancer Sister   ? Alcohol abuse Father   ? Breast cancer Maternal Grandmother   ? Colon cancer Neg Hx   ? Heart attack Neg Hx   ? Stroke Neg Hx   ? Hypertension Neg Hx   ? Esophageal cancer Neg Hx   ? Rectal cancer Neg Hx   ? Stomach cancer Neg Hx   ? ?Social History  ? ?Socioeconomic History  ? Marital status: Single  ?  Spouse name: Not on file  ? Number of children: Not on file  ? Years of education: Not on file  ? Highest education level: Not on file  ?Occupational History  ? Occupation: CSR  ?Tobacco Use  ? Smoking status: Former  ?  Packs/day: 1.00  ?  Years: 20.00  ?  Pack years: 20.00  ?  Types: Cigarettes  ?  Quit date: 06/21/1984  ?  Years since quitting: 37.2  ? Smokeless tobacco: Never  ?Vaping Use  ? Vaping Use: Never used  ?Substance and Sexual Activity  ? Alcohol use: No  ?  Alcohol/week: 0.0 standard drinks  ? Drug use: No  ? Sexual activity: Not on file  ?Other Topics Concern  ? Not on file  ?Social History Narrative  ? orig from [place]     ? In  for [place] years  Has family here  ? Works 45 per week [employer]   ? 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   ? HS education  single  ? 6 hours of sleep  ? Hh  Of 1  Pet dog and a horse.   ? Remote tobacco  1-2 ppd stopped 1988   ? Etoh couple times a month rare   ? ocass  Diet soda.   ?   ?   ?   ? ?Social Determinants of Health  ? ?Financial Resource Strain: Low Risk   ? Difficulty of Paying Living Expenses: Not hard at all  ?Food Insecurity: No Food Insecurity  ? Worried About Programme researcher, broadcasting/film/video in the Last Year: Never true  ? Ran Out of Food in the Last Year: Never true  ?Transportation Needs: No Transportation Needs  ? Lack of Transportation (Medical): No  ? Lack of Transportation (Non-Medical): No  ?Physical Activity: Sufficiently Active  ? Days of Exercise per Week: 5 days  ? Minutes of Exercise per Session: 60 min  ?Stress: No  Stress Concern Present  ? Feeling of Stress : Not at all  ?Social Connections: Socially Isolated  ?  Frequency of Communication with Friends and Family: More than three times a week  ? Frequency of Social Gatherings with Friends and Family: More than three times a week  ? Attends Religious Services: Never  ? Active Member of Clubs or Organizations: No  ? Attends Banker Meetings: Never  ? Marital Status: Never married  ? ? ? ?Clinical Intake: ?Pre-visit preparation completed: NoHow often do you need to have someone help you when you read instructions, pamphlets, or other written materials from your doctor or pharmacy?: 1 - Never ? ?Diabetic?  No ? ?Interpreter Needed?: No ? ?Activities of Daily Living ?In your present state of health, do you have any difficulty performing the following activities: 08/27/2021  ?Hearing? N  ?Vision? N  ?Difficulty concentrating or making decisions? N  ?Walking or climbing stairs? N  ?Dressing or bathing? N  ?Doing errands, shopping? N  ?Preparing Food and eating ? N  ?Using the Toilet? N  ?In the past six months, have you accidently leaked urine? N  ?Do you have problems with loss of bowel control? N  ?Managing your Medications? N  ?Managing your Finances? N  ?Housekeeping or managing your Housekeeping? N  ?Some recent data might be hidden  ? ? ?Patient Care Team: ?Panosh, Neta Mends, MD as PCP - General (Internal Medicine) ?Pricilla Riffle, MD as PCP - Cardiology (Cardiology) ?Nita Sells, MD (Dermatology) ?Sherrilyn Rist, MD as Consulting Physician (Gastroenterology) ? ?Indicate any recent Medical Services you may have received from other than [facility] providers in the past year (date may be approximate). ? ?   ?Assessment:  ? This is a routine wellness examination for Penny Barnett. ? ?Hearing/Vision screen ?Hearing Screening - Comments:: No difficulty hearing ?Vision Screening - Comments:: No vision difficulty. Followed by Dr Emily Filbert ? ?Dietary issues and exercise activities  discussed: ?Exercise limited by: None identified ? ? Goals Addressed   ? ?  ?  ?  ?  ?  ? This Visit's Progress  ?   Lose 25 lbs. (pt-stated)     ?   Would like to lose 20lbs  ?  ? ?  ? ?Depression Screen ?PHQ 2/9 S

## 2021-08-28 MED ORDER — PITAVASTATIN CALCIUM 2 MG PO TABS
2.0000 mg | ORAL_TABLET | Freq: Every day | ORAL | 2 refills | Status: DC
Start: 2021-08-28 — End: 2021-10-30

## 2021-09-13 ENCOUNTER — Other Ambulatory Visit: Payer: Self-pay | Admitting: Internal Medicine

## 2021-09-24 ENCOUNTER — Ambulatory Visit (INDEPENDENT_AMBULATORY_CARE_PROVIDER_SITE_OTHER)
Admission: RE | Admit: 2021-09-24 | Discharge: 2021-09-24 | Disposition: A | Discharge status: Home / Self Care | Payer: Self-pay | Source: Ambulatory Visit | Attending: Internal Medicine | Admitting: Internal Medicine

## 2021-09-24 DIAGNOSIS — I429 Cardiomyopathy, unspecified: Secondary | ICD-10-CM

## 2021-09-24 DIAGNOSIS — E785 Hyperlipidemia, unspecified: Secondary | ICD-10-CM

## 2021-09-29 ENCOUNTER — Other Ambulatory Visit: Payer: Self-pay

## 2021-09-29 ENCOUNTER — Telehealth: Payer: Self-pay

## 2021-09-29 MED ORDER — ASPIRIN EC 81 MG PO TBEC: 81.0000 mg | DELAYED_RELEASE_TABLET | Freq: Every day | ORAL | 3 refills | Status: DC

## 2021-09-29 NOTE — Telephone Encounter (Signed)
ASA added to her med list... will hold on the referral and stress test order until I hear back from the pt.  ?

## 2021-09-29 NOTE — Telephone Encounter (Signed)
-----   Message from Paula Ross V, MD sent at 09/25/2021  3:24 PM EDT ----- ?Called patient   REveiwed CT scan ?1.  ecASA 81 mg  ?2   Stress myoview   If cannot complete then Lexiscan myoview ?3Referral for Repatha    PT on Livalo   Had problems with other statins     Did not tolerate Zetia ?Pt is out of town   Says she will call back when she is back in town to schedule   She will start taking asa ?

## 2021-09-29 NOTE — Telephone Encounter (Signed)
-----   Message from Pricilla Riffle, MD sent at 09/25/2021  3:24 PM EDT ----- ?Called patient   REveiwed CT scan ?1.  ecASA 81 mg  ?2   Stress myoview   If cannot complete then Lexiscan myoview ?3Referral for Repatha    PT on Livalo   Had problems with other statins     Did not tolerate Zetia ?Pt is out of town   Says she will call back when she is back in town to schedule   She will start taking asa ?

## 2021-09-29 NOTE — Telephone Encounter (Signed)
ASA added to the pts med list.. will wait for her call prior to ordering her stress test and Repatha referral per Dr. Tenny Craw' note.  ?

## 2021-10-01 NOTE — Telephone Encounter (Signed)
Spoke with the pt and she will My Chart me when she returns and ready to schedule her stress test.  ?

## 2021-10-16 ENCOUNTER — Encounter: Payer: Self-pay | Admitting: Internal Medicine

## 2021-10-16 DIAGNOSIS — E785 Hyperlipidemia, unspecified: Secondary | ICD-10-CM

## 2021-10-16 DIAGNOSIS — R0609 Other forms of dyspnea: Secondary | ICD-10-CM

## 2021-10-17 ENCOUNTER — Other Ambulatory Visit: Payer: Self-pay | Admitting: Internal Medicine

## 2021-10-20 NOTE — Addendum Note (Signed)
Addended by: Bertram Millard on: 10/20/2021 09:58 AM ? ? Modules accepted: Orders ? ?

## 2021-10-20 NOTE — Telephone Encounter (Signed)
Stress Myoview ordered per Dr Tenny Craw.  ? ?Instructions sent to the pt for her review.  ?

## 2021-10-21 NOTE — Addendum Note (Signed)
Addended by: Pricilla Riffle on: 10/21/2021 03:32 PM ? ? Modules accepted: Orders ? ?

## 2021-10-30 ENCOUNTER — Other Ambulatory Visit: Payer: Self-pay

## 2021-10-30 MED ORDER — PITAVASTATIN CALCIUM 2 MG PO TABS: 2.0000 mg | ORAL_TABLET | Freq: Every day | ORAL | 0 refills | Status: DC

## 2021-10-30 MED ORDER — TRIAMTERENE-HCTZ 37.5-25 MG PO TABS
0.5000 | ORAL_TABLET | Freq: Every day | ORAL | 0 refills | Status: DC
Start: 2021-10-30 — End: 2021-12-09

## 2021-10-30 MED ORDER — BISOPROLOL FUMARATE 5 MG PO TABS: 5.0000 mg | ORAL_TABLET | Freq: Every day | ORAL | 0 refills | Status: DC

## 2021-10-31 ENCOUNTER — Other Ambulatory Visit: Payer: Self-pay

## 2021-10-31 MED ORDER — ALBUTEROL SULFATE HFA 108 (90 BASE) MCG/ACT IN AERS: 2.0000 | INHALATION_SPRAY | Freq: Four times a day (QID) | RESPIRATORY_TRACT | 3 refills | Status: DC | PRN

## 2021-10-31 MED ORDER — POTASSIUM CHLORIDE CRYS ER 20 MEQ PO TBCR
20.0000 meq | EXTENDED_RELEASE_TABLET | Freq: Every day | ORAL | 2 refills | Status: DC
Start: 2021-10-31 — End: 2021-12-17

## 2021-10-31 MED ORDER — MONTELUKAST SODIUM 10 MG PO TABS: ORAL_TABLET | ORAL | 2 refills | Status: DC

## 2021-11-03 ENCOUNTER — Encounter: Payer: Self-pay | Admitting: Internal Medicine

## 2021-11-03 ENCOUNTER — Telehealth (HOSPITAL_COMMUNITY): Payer: Self-pay | Admitting: Internal Medicine

## 2021-11-03 NOTE — Telephone Encounter (Signed)
Patient called and cancelled Myoview for reason below:  11/03/2021 8:21 AM [id number], ANGELINE S  Cancel Rsn: Patient (pt is having back pain and will call back to r/s)  Order will be removed from the [facility].

## 2021-11-04 ENCOUNTER — Encounter (HOSPITAL_COMMUNITY): Payer: Medicare Other

## 2021-11-04 ENCOUNTER — Telehealth: Payer: Self-pay

## 2021-11-04 NOTE — Telephone Encounter (Signed)
Patient called back and stated she has had cough for 2 wks and can't get rid of and has been taking Coricidin HBP and did a home Covid test which was negative.  Patient was scheduled for Vv 11/05/21

## 2021-11-05 ENCOUNTER — Encounter: Payer: Self-pay | Admitting: Internal Medicine

## 2021-11-05 ENCOUNTER — Telehealth (INDEPENDENT_AMBULATORY_CARE_PROVIDER_SITE_OTHER): Payer: Medicare Other | Admitting: Internal Medicine

## 2021-11-05 VITALS — Temp 97.6°F

## 2021-11-05 DIAGNOSIS — M545 Low back pain, unspecified: Secondary | ICD-10-CM

## 2021-11-05 DIAGNOSIS — R053 Chronic cough: Secondary | ICD-10-CM | POA: Diagnosis not present

## 2021-11-05 NOTE — Progress Notes (Signed)
Virtual Visit via Video Note  I connected with Penny Barnett on 11/05/21 at  9:30 AM EDT by a video enabled telemedicine application and verified that I am speaking with the correct person using two identifiers. Location patient: home Location provider:work office Persons participating in the virtual visit: patient, provider   HPI: Penny Barnett presents for video visit  for concern about ongoing cough Last visit with me 8 22 for pc  Onset  with 100.5  neg covid HT about 2 weeks ago then resoled  since then cough  initially productive yellow no blood  no  systemic sx  continuing in halers no wheezing  then Cough is better this am and only am mucous  Developed lbp middle  last 1-2 days   Went to [employer] with somem help and seems localized  In am   mucous  may have som [employer] hr when active   under cards eval fo stress test  Chiro  helped back   lower middle   .   No fever .  Better  this am  going back to day  Taking coricidin for hbp   Doesn't feel badly at this time  and better this am . Below is  last pulm note  Breathing test showed normal spirometry, no evidence of obstructive or restrictive airway disease. Normal lung volumes and diffusion capacity. No significant BD response.    Lung function minimally declined in the last 5 years, overall stable   Recommendations: Continue Flovent two puffs morning and evening Atrovent 2 puffs morning and evening Use albuterol (ventolin) 2 puffs every 4-6 hours for breakthrough shortness of breath/wheezing  Continue Singulair 10mg  at bedtime    Follow-up:  6 months with Dr. or sooner if needed  ROS: See pertinent positives and negatives per HPI.  Past Medical History:  Diagnosis Date   Allergy    Anemia    Arthritis    hands   Asthma    as a child and then attack 1996  poss allergic to cat hair. ventilated x 2 . saw Dr Craige Cotta then .    Atrial flutter (HCC)    a. 12/2014 Echo: EF 60-65%, no rwma, mild MR;  b. S/P  TEE/DCCV;  c. CHA2DS2VASc = 3-->Eliquis.   Cataract    Had surgery to remove   GERD (gastroesophageal reflux disease)    2000 taking otc prevacid   evey day.    Hyperlipidemia    Hypertension     3 meds controlled   Kidney stone    passed stone - no surgery required    Past Surgical History:  Procedure Laterality Date   CARDIOVERSION N/A 01/03/2015   Procedure: CARDIOVERSION;  Surgeon: 01/05/2015, MD;  Location: [facility] ENDOSCOPY;  Service: Cardiovascular;  Laterality: N/A;   CARDIOVERSION N/A 08/06/2020   Procedure: CARDIOVERSION;  Surgeon: 08/08/2020, MD;  Location: [facility] ENDOSCOPY;  Service: Cardiovascular;  Laterality: N/A;   CATARACT EXTRACTION Bilateral 10/14/2010   both eyes   COLONOSCOPY  02/2011   [facility] SURGERY Bilateral    x 2 - frozen shoulder rt 2006, 2000 left    TEE WITHOUT CARDIOVERSION N/A 01/03/2015   Procedure: TRANSESOPHAGEAL ECHOCARDIOGRAM (TEE);  Surgeon: 01/05/2015, MD;  Location: [facility] ENDOSCOPY;  Service: Cardiovascular;  Laterality: N/A;   TONSILLECTOMY      Family History  Problem Relation Age of Onset   Breast cancer Mother    Bladder Cancer Mother  Uterine cancer Sister    Alcohol abuse Father    Breast cancer Maternal Grandmother    Colon cancer Neg Hx    Heart attack Neg Hx    Stroke Neg Hx    Hypertension Neg Hx    Esophageal cancer Neg Hx    Rectal cancer Neg Hx    Stomach cancer Neg Hx     Social History   Tobacco Use   Smoking status: Former    Packs/day: 1.00    Years: 20.00    Pack years: 20.00    Types: Cigarettes    Quit date: 06/21/1984    Years since quitting: 37.4   Smokeless tobacco: Never  Vaping Use   Vaping Use: Never used  Substance Use Topics   Alcohol use: No    Alcohol/week: 0.0 standard drinks   Drug use: No      Current Outpatient Medications:    albuterol (VENTOLIN HFA) 108 (90 Base) MCG/ACT inhaler, Inhale 2 puffs into the lungs every 6 (six) hours as needed for wheezing or shortness of  breath., Disp: 24 g, Rfl: 3   aspirin EC 81 MG tablet, Take 1 tablet (81 mg total) by mouth daily. Swallow whole., Disp: 90 tablet, Rfl: 3   bisoprolol (ZEBETA) 5 MG tablet, Take 1 tablet (5 mg total) by mouth daily. Please make overdue appt with Dr Tenny Crawoss before anymore refills. Thank you 2nd attempt, Disp: 15 tablet, Rfl: 0   Cholecalciferol (VITAMIN D-3) 125 MCG (5000 UT) TABS, Take 5,000 Units by mouth daily., Disp: , Rfl:    Coenzyme Q10 (CO Q 10) 100 MG CAPS, Take 100 mg by mouth daily., Disp: , Rfl:    diclofenac Sodium (VOLTAREN) 1 % GEL, Apply 2 g topically daily as needed (Pain)., Disp: , Rfl:    FLOVENT HFA 110 MCG/ACT inhaler, INHALE 1-2 PUFFS INTO THE LUNGS 2 (TWO) TIMES DAILY, Disp: 12 each, Rfl: 2   ipratropium (ATROVENT HFA) 17 MCG/ACT inhaler, Inhale 2 puffs into the lungs in the morning and at bedtime., Disp: 1 each, Rfl: 5   montelukast (SINGULAIR) 10 MG tablet, TAKE 1 TABLET BY MOUTH EVERY DAY IN THE MORNING, Disp: 90 tablet, Rfl: 2   Multiple Vitamins-Minerals (MULTIVITAMIN WITH MINERALS) tablet, Take 1 tablet by mouth daily. With Iron, Disp: , Rfl:    omeprazole (PRILOSEC) 20 MG capsule, Take 20 mg by mouth daily., Disp: , Rfl:    Pitavastatin Calcium 2 MG TABS, Take 1 tablet (2 mg total) by mouth daily. Please make overdue appt with Dr. Tenny Crawoss before anymore refills. Thank you 2nd attempt, Disp: 15 tablet, Rfl: 0   potassium chloride SA (KLOR-CON M20) 20 MEQ tablet, Take 1 tablet (20 mEq total) by mouth daily., Disp: 90 tablet, Rfl: 2   Probiotic Product (PROBIOTIC DAILY PO), Take 1 capsule by mouth daily., Disp: , Rfl:    triamterene-hydrochlorothiazide (MAXZIDE-25) 37.5-25 MG tablet, Take 0.5 tablets by mouth daily., Disp: 7 tablet, Rfl: 0   vitamin C (ASCORBIC ACID) 500 MG tablet, Take 500 mg by mouth daily., Disp: , Rfl:    zinc gluconate 50 MG tablet, Take 50 mg by mouth daily., Disp: , Rfl:   EXAM: BP Readings from Last 3 Encounters:  08/27/21 122/64  08/04/21 128/60   03/25/21 118/86    VITALS no fever  Camera foggy had tape over   GENERAL: alert, oriented,  in no acute distress LUNGS: , breathing rate appears normal, no obvious gross SOB, gasping or wheezing no cough during  visit   no stridor   PSYCH/NEURO: pleasant and cooperative, no obvious depression or anxiety, speech and thought processing grossly intact  ASSESSMENT AND PLAN:  Discussed the following assessment and plan:    ICD-10-CM   1. Cough, persistent  R05.3    suspecting viral initiator and no alarm sx as getting better  but if not improv  next week or worse then advise in person visit    2. Midline low back pain without sciatica, unspecified chronicity  M54.50    sound mechanichal and poss tiggered by cough but not seeming pulmoary in origin can continue  chiro care .     Since all slow improvement  close observation advised and in person visit in a week if not improving or worse  contact with alarm sx  Continue on her pulmonary inhalers and singlulair Counseled.   Expectant management and discussion of plan and treatment with opportunity to ask questions and all were answered. The patient agreed with the plan and demonstrated an understanding of the instructions.   Advised to call back or seek an in-person evaluation if worsening  or having  further concerns  in interim. Return if symptoms worsen or fail to improve as expected  in person.    Berniece Andreas, MD

## 2021-11-13 ENCOUNTER — Other Ambulatory Visit: Payer: Self-pay | Admitting: Internal Medicine

## 2021-11-15 ENCOUNTER — Other Ambulatory Visit: Payer: Self-pay | Admitting: Internal Medicine

## 2021-11-27 ENCOUNTER — Telehealth (HOSPITAL_COMMUNITY): Payer: Self-pay

## 2021-11-27 ENCOUNTER — Other Ambulatory Visit: Payer: Self-pay | Admitting: Internal Medicine

## 2021-11-27 MED ORDER — PITAVASTATIN CALCIUM 2 MG PO TABS: 2.0000 mg | ORAL_TABLET | Freq: Every day | ORAL | 2 refills | Status: DC

## 2021-11-27 NOTE — Telephone Encounter (Signed)
Spoke with the patient, detailed instructions given. She stated that she would be hear for her test. Asked to call back with any questions. S.Zechariah Bissonnette EMTP

## 2021-12-02 ENCOUNTER — Ambulatory Visit (HOSPITAL_COMMUNITY): Payer: Medicare Other | Attending: Cardiovascular Disease

## 2021-12-02 DIAGNOSIS — E785 Hyperlipidemia, unspecified: Secondary | ICD-10-CM | POA: Diagnosis not present

## 2021-12-02 DIAGNOSIS — R0609 Other forms of dyspnea: Secondary | ICD-10-CM | POA: Diagnosis not present

## 2021-12-02 LAB — MYOCARDIAL PERFUSION IMAGING
LV dias vol: 47 mL (ref 46–106)
LV sys vol: 22 mL
Nuc Stress EF: 52 %
Peak HR: 130 {beats}/min
Rest HR: 85 {beats}/min
Rest Nuclear Isotope Dose: 10.1 mCi
SDS: 1
SRS: 0
SSS: 1
ST Depression (mm): 0 mm
Stress Nuclear Isotope Dose: 31.6 mCi
TID: 1

## 2021-12-02 MED ORDER — REGADENOSON 0.4 MG/5ML IV SOLN
0.4000 mg | Freq: Once | INTRAVENOUS | Status: AC
  Administered 2021-12-02: 0.4 mg via INTRAVENOUS

## 2021-12-02 MED ORDER — TECHNETIUM TC 99M TETROFOSMIN IV KIT
10.1000 | PACK | Freq: Once | INTRAVENOUS | Status: AC | PRN
  Administered 2021-12-02: 10.1 via INTRAVENOUS

## 2021-12-02 MED ORDER — TECHNETIUM TC 99M TETROFOSMIN IV KIT
31.6000 | PACK | Freq: Once | INTRAVENOUS | Status: AC | PRN
  Administered 2021-12-02: 31.6 via INTRAVENOUS

## 2021-12-08 ENCOUNTER — Other Ambulatory Visit: Payer: Self-pay | Admitting: Internal Medicine

## 2021-12-08 DIAGNOSIS — Z1231 Encounter for screening mammogram for malignant neoplasm of breast: Secondary | ICD-10-CM

## 2021-12-09 ENCOUNTER — Other Ambulatory Visit: Payer: Self-pay | Admitting: Internal Medicine

## 2021-12-09 DIAGNOSIS — H43813 Vitreous degeneration, bilateral: Secondary | ICD-10-CM | POA: Diagnosis not present

## 2021-12-09 DIAGNOSIS — H5213 Myopia, bilateral: Secondary | ICD-10-CM | POA: Diagnosis not present

## 2021-12-13 ENCOUNTER — Other Ambulatory Visit: Payer: Self-pay | Admitting: Internal Medicine

## 2021-12-17 NOTE — Telephone Encounter (Signed)
Last Vv 11/05/21 Filled 10/31/21 Is it ok to refill

## 2021-12-22 NOTE — Progress Notes (Unsigned)
Cardiology Office Note:    Date:  12/23/2021   ID:  Penny Barnett, DOB 1950/08/06, MRN [id number]  PCP:  Madelin Headings, MD  [facility] Cardiologist:  Dietrich Pates, MD  [facility] Electrophysiologist:  None   Chief Complaint: 55-month follow-up  History of Present Illness:    Penny Barnett is a 71 y.o. female with a hx of paroxysmal atrial flutter, hypertension, hyperlipidemia, hypothyroidism and asthma seen for follow-up.  Diagnosed with atrial flutter in 2016.  Underwent TEE cardioversion.  Had recurrent A-fib RVR when diagnosed with hyperthyroidism November 2021.  Underwent cardioversion February 2022.  Monitor 02/2021 without evidence of atrial fibrillation.  Short SVT. CT cardiac 09/2021: Coronary calcium score of 391. This was 90 percentile for age-, ace-, and sex-matched controls. Stress test 11/2021 was low risk without evidence of ischemia or infection.  EF was 52%.  Here today for follow up.  She is incidentally found to be in atrial fibrillation at a controlled ventricular rate.  Occasionally she has found her heart rate fluctuating on pulse ox.  She never felt any palpitation, fluttering sensation, chest pain, shortness of breath, dizziness, orthopnea, PND, syncope, lower extremity edema or melena.  She is not on any thyroid medication.  Past Medical History:  Diagnosis Date   Allergy    Anemia    Arthritis    hands   Asthma    as a child and then attack 1996  poss allergic to cat hair. ventilated x 2 . saw Dr Gridley Callas then .    Atrial flutter (HCC)    a. 12/2014 Echo: EF 60-65%, no rwma, mild MR;  b. S/P TEE/DCCV;  c. CHA2DS2VASc = 3-->Eliquis.   Cataract    Had surgery to remove   GERD (gastroesophageal reflux disease)    2000 taking otc prevacid   evey day.    Hyperlipidemia    Hypertension     3 meds controlled   Kidney stone    passed stone - no surgery required    Past Surgical History:  Procedure Laterality Date   CARDIOVERSION N/A 01/03/2015    Procedure: CARDIOVERSION;  Surgeon: Pricilla Riffle, MD;  Location: [facility] ENDOSCOPY;  Service: Cardiovascular;  Laterality: N/A;   CARDIOVERSION N/A 08/06/2020   Procedure: CARDIOVERSION;  Surgeon: [name], MD;  Location: [facility] ENDOSCOPY;  Service: Cardiovascular;  Laterality: N/A;   CATARACT EXTRACTION Bilateral 10/14/2010   both eyes   COLONOSCOPY  02/2011   [facility] SURGERY Bilateral    x 2 - frozen shoulder rt 2006, 2000 left    TEE WITHOUT CARDIOVERSION N/A 01/03/2015   Procedure: TRANSESOPHAGEAL ECHOCARDIOGRAM (TEE);  Surgeon: Pricilla Riffle, MD;  Location: [facility] ENDOSCOPY;  Service: Cardiovascular;  Laterality: N/A;   TONSILLECTOMY      Current Medications: Current Meds  Medication Sig   albuterol (VENTOLIN HFA) 108 (90 Base) MCG/ACT inhaler Inhale 2 puffs into the lungs every 6 (six) hours as needed for wheezing or shortness of breath.   apixaban (ELIQUIS) 5 MG TABS tablet Take 1 tablet (5 mg total) by mouth 2 (two) times daily.   bisoprolol (ZEBETA) 5 MG tablet TAKE 1 TABLET BY MOUTH DAILY   Cholecalciferol (VITAMIN D-3) 125 MCG (5000 UT) TABS Take 5,000 Units by mouth daily.   Coenzyme Q10 (CO Q 10) 100 MG CAPS Take 100 mg by mouth daily.   diclofenac Sodium (VOLTAREN) 1 % GEL Apply 2 g topically daily as needed (Pain).   FLOVENT HFA 110  MCG/ACT inhaler INHALE 1-2 PUFFS INTO THE LUNGS 2 (TWO) TIMES DAILY   ipratropium (ATROVENT HFA) 17 MCG/ACT inhaler Inhale 2 puffs into the lungs in the morning and at bedtime.   KLOR-CON M20 20 MEQ tablet TAKE 1 TABLET BY MOUTH EVERY DAY   montelukast (SINGULAIR) 10 MG tablet TAKE 1 TABLET BY MOUTH EVERY DAY IN THE MORNING   Multiple Vitamins-Minerals (MULTIVITAMIN WITH MINERALS) tablet Take 1 tablet by mouth daily. With Iron   omeprazole (PRILOSEC) 20 MG capsule Take 20 mg by mouth daily.   Pitavastatin Calcium 2 MG TABS Take 1 tablet (2 mg total) by mouth daily. Please keep upcoming appt with Dr. Tenny Craw in September 2023 before anymore  refills. Thank you Final attempt   Probiotic Product (PROBIOTIC DAILY PO) Take 1 capsule by mouth daily.   triamterene-hydrochlorothiazide (MAXZIDE-25) 37.5-25 MG tablet TAKE 1/2 TABLET BY MOUTH EVERY DAY   vitamin C (ASCORBIC ACID) 500 MG tablet Take 500 mg by mouth daily.   zinc gluconate 50 MG tablet Take 50 mg by mouth daily.   [DISCONTINUED] aspirin EC 81 MG tablet Take 1 tablet (81 mg total) by mouth daily. Swallow whole.     Allergies:   Crestor [rosuvastatin] and Penicillins   Social History   Socioeconomic History   Marital status: Single    Spouse name: Not on file   Number of children: Not on file   Years of education: Not on file   Highest education level: Not on file  Occupational History   Occupation: CSR  Tobacco Use   Smoking status: Former    Packs/day: 1.00    Years: 20.00    Total pack years: 20.00    Types: Cigarettes    Quit date: 06/21/1984    Years since quitting: 37.5   Smokeless tobacco: Never  Vaping Use   Vaping Use: Never used  Substance and Sexual Activity   Alcohol use: No    Alcohol/week: 0.0 standard drinks of alcohol   Drug use: No   Sexual activity: Not on file  Other Topics Concern   Not on file  Social History Narrative   [place]      In [place] for 30 years  Has family here   Works 45 per week [employer]    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    HS education  single   6 hours of sleep   Hh  Of 1  Pet dog and a horse.    Remote tobacco  1-2 ppd stopped 1988    Etoh couple times a month rare    ocass  Diet soda.             Social Determinants of Health   Financial Resource Strain: Low Risk  (08/27/2021)   Overall Financial Resource Strain (CARDIA)    Difficulty of Paying Living Expenses: Not hard at all  Food Insecurity: No Food Insecurity (08/27/2021)   Hunger Vital Sign    Worried About Running Out of Food in the Last Year: Never true    Ran Out of Food in the Last Year: Never true  Transportation  Needs: No Transportation Needs (08/27/2021)   PRAPARE - Administrator, Civil Service (Medical): No    Lack of Transportation (Non-Medical): No  Physical Activity: Sufficiently Active (08/27/2021)   Exercise Vital Sign    Days of Exercise per Week: 5 days    Minutes of Exercise per Session: 60  min  Stress: No Stress Concern Present (08/27/2021)   [employer] of Occupational Health - Occupational Stress Questionnaire    Feeling of Stress : Not at all  Social Connections: Socially Isolated (08/27/2021)   Social Connection and Isolation Panel [NHANES]    Frequency of Communication with Friends and Family: More than three times a week    Frequency of Social Gatherings with Friends and Family: More than three times a week    Attends Religious Services: Never    Database administrator or Organizations: No    Attends Engineer, structural: Never    Marital Status: Never married     Family History: The patient's family history includes Alcohol abuse in her father; Bladder Cancer in her mother; Breast cancer in her maternal grandmother and mother; Uterine cancer in her sister. There is no history of Colon cancer, Heart attack, Stroke, Hypertension, Esophageal cancer, Rectal cancer, or Stomach cancer.    ROS:   Please see the history of present illness.    All other systems reviewed and are negative.   EKGs/Labs/Other Studies Reviewed:    The following studies were reviewed today: Stress test 11/2021 The study is normal. The study is low risk.   No ST deviation was noted.   Left ventricular function is normal. Nuclear stress EF: 52 %. The left ventricular ejection fraction is mildly decreased (45-54%). End diastolic cavity size is normal.   Prior study not available for comparison.  Monitor 02/2021 Patch Wear Time:  7 days and 14 hours (2022-08-20T10:39:16-0400 to 2022-08-28T00:54:20-0400)   Predominant rhythm:  Sinus rhythm   Overall heart rates 52 to 174 bpm    Average HR 67 bpm   Intermttent SVT, longest lasting 21.4 sec (130 bpm)   Rare PVCs,   Occasional PVCs     No diary entries/triggered events     Echo 05/2020 1. Left ventricular ejection fraction, by estimation, is 60 to 65%. The  left ventricle has normal function. Left ventricular diastolic function  could not be evaluated.   2. Right ventricular systolic function is normal. The right ventricular  size is normal. There is normal pulmonary artery systolic pressure.   3. The mitral valve is grossly normal. Mild mitral valve regurgitation.   4. The aortic valve is normal in structure. Aortic valve regurgitation is  not visualized. No aortic stenosis is present.     EKG:  EKG is  ordered today.  The ekg ordered today demonstrates Atrial fibrillation   Recent Labs: 02/04/2021: BUN 25; Creatinine, Ser 0.72; Hemoglobin 13.2; Platelets 226.0; Potassium 3.4; Sodium 138 04/22/2021: ALT 19  Recent Lipid Panel    Component Value Date/Time   CHOL 276 (H) 02/04/2021 1147   CHOL 223 (H) 07/29/2020 1208   TRIG 164.0 (H) 02/04/2021 1147   HDL 61.80 02/04/2021 1147   HDL 58 07/29/2020 1208   CHOLHDL 4 02/04/2021 1147   VLDL 32.8 02/04/2021 1147   LDLCALC 181 (H) 02/04/2021 1147   LDLCALC 133 (H) 07/29/2020 1208   LDLCALC 170 (H) 01/23/2020 1022   LDLDIRECT 129.0 11/28/2018 1048     Physical Exam:    VS:  BP 120/90   Pulse 86   Ht 5' 4.5" (1.638 m)   Wt 171 lb (77.6 kg)   SpO2 96%   BMI 28.90 kg/m     Wt Readings from Last 3 Encounters:  12/23/21 171 lb (77.6 kg)  12/02/21 177 lb (80.3 kg)  08/27/21 177 lb 9.6 oz (80.6 kg)  GEN:  Well nourished, well developed in no acute distress HEENT: Normal NECK: No JVD; No carotid bruits LYMPHATICS: No lymphadenopathy CARDIAC: IR IR , no murmurs, rubs, gallops RESPIRATORY:  Clear to auscultation without rales, wheezing or rhonchi  ABDOMEN: Soft, non-tender, non-distended MUSCULOSKELETAL:  No edema; No deformity  SKIN: Warm and  dry NEUROLOGIC:  Alert and oriented x 3 PSYCHIATRIC:  Normal affect   ASSESSMENT AND PLAN:    Paroxysmal atrial fibrillation -Noted in atrial fibrillation with controlled ventricular rate.  She has noted fluctuating heart rate down to times on pulse ox.  Stop aspirin.  Start Eliquis 5 mg twice daily.  Get TSH and electrolyte.  Obtain echocardiogram. - Follow-up closely with either Dr. Tenny Crawoss or atrial fibrillation clinic to determine further plan.  She is asymptomatic with atrial fibrillation.  We can keep her on rate control strategy or consider cardioversion after 3 weeks of anticoagulation.  2.  Hypertension -Blood pressure stable on current medication  3.  History of hypothyroidism/hypothyroidism -Check TSH.  If abnormal, work-up per primary care provider. Not on any medications.     Medication Adjustments/Labs and Tests Ordered: Current medicines are reviewed at length with the patient today.  Concerns regarding medicines are outlined above.  Orders Placed This Encounter  Procedures   Basic metabolic panel   TSH   EKG 12-Lead   ECHOCARDIOGRAM COMPLETE   Meds ordered this encounter  Medications   apixaban (ELIQUIS) 5 MG TABS tablet    Sig: Take 1 tablet (5 mg total) by mouth 2 (two) times daily.    Dispense:  180 tablet    Refill:  3    Patient Instructions  Medication Instructions:  Your physician has recommended you make the following change in your medication:  STOP: Aspirin START: apixaban (Eliquis) 5 mg by mouth twice daily- - We have provided you with samples of this medication along with information for patient assistance.   *If you need a refill on your cardiac medications before your next appointment, please call your pharmacy*   Lab Work: TODAY: TSH, BMP If you have labs (blood work) drawn today and your tests are completely normal, you will receive your results only by: MyChart Message (if you have MyChart) OR A paper copy in the mail If you have any lab  test that is abnormal or we need to change your treatment, we will call you to review the results.   Testing/Procedures: Your physician has requested that you have an echocardiogram. Echocardiography is a painless test that uses sound waves to create images of your heart. It provides your doctor with information about the size and shape of your heart and how well your heart's chambers and valves are working. This procedure takes approximately one hour. There are no restrictions for this procedure.    Follow-Up: At [facility], you and your health needs are our priority.  As part of our continuing mission to provide you with exceptional heart care, we have created designated Provider Care Teams.  These Care Teams include your primary Cardiologist (physician) and Advanced Practice Providers (APPs -  Physician Assistants and Nurse Practitioners) who all work together to provide you with the care you need, when you need it.   Your next appointment:     The format for your next appointment:   In Person  Provider:   Dietrich PatesPaula Ross, MD  or [facility]      Important Information About Sugar         Signed, Sharrell KuBhavinkumar  Garfield, PA  12/23/2021 10:30 AM    [facility]

## 2021-12-23 ENCOUNTER — Other Ambulatory Visit: Payer: Self-pay | Admitting: Internal Medicine

## 2021-12-23 ENCOUNTER — Ambulatory Visit: Payer: Medicare Other | Admitting: Physician Assistant

## 2021-12-23 ENCOUNTER — Encounter: Payer: Self-pay | Admitting: Physician Assistant

## 2021-12-23 VITALS — BP 120/90 | HR 86 | Ht 64.5 in | Wt 171.0 lb

## 2021-12-23 DIAGNOSIS — I10 Essential (primary) hypertension: Secondary | ICD-10-CM

## 2021-12-23 DIAGNOSIS — E039 Hypothyroidism, unspecified: Secondary | ICD-10-CM | POA: Diagnosis not present

## 2021-12-23 DIAGNOSIS — I480 Paroxysmal atrial fibrillation: Secondary | ICD-10-CM | POA: Diagnosis not present

## 2021-12-23 MED ORDER — APIXABAN 5 MG PO TABS: 5.0000 mg | ORAL_TABLET | Freq: Two times a day (BID) | ORAL | 3 refills | Status: DC

## 2021-12-23 NOTE — Patient Instructions (Addendum)
Medication Instructions:  Your physician has recommended you make the following change in your medication:  STOP: Aspirin START: apixaban (Eliquis) 5 mg by mouth twice daily- - We have provided you with samples of this medication along with information for patient assistance.   *If you need a refill on your cardiac medications before your next appointment, please call your pharmacy*   Lab Work: TODAY: TSH, BMP If you have labs (blood work) drawn today and your tests are completely normal, you will receive your results only by: MyChart Message (if you have MyChart) OR A paper copy in the mail If you have any lab test that is abnormal or we need to change your treatment, we will call you to review the results.   Testing/Procedures: Your physician has requested that you have an echocardiogram. Echocardiography is a painless test that uses sound waves to create images of your heart. It provides your doctor with information about the size and shape of your heart and how well your heart's chambers and valves are working. This procedure takes approximately one hour. There are no restrictions for this procedure.    Follow-Up: At [facility], you and your health needs are our priority.  As part of our continuing mission to provide you with exceptional heart care, we have created designated Provider Care Teams.  These Care Teams include your primary Cardiologist (physician) and Advanced Practice Providers (APPs -  Physician Assistants and Nurse Practitioners) who all work together to provide you with the care you need, when you need it.   Your next appointment:     The format for your next appointment:   In Person  Provider:   Dietrich Pates, MD  or [facility]      Important Information About Sugar

## 2021-12-24 LAB — BASIC METABOLIC PANEL
BUN/Creatinine Ratio: 41 — ABNORMAL HIGH (ref 12–28)
BUN: 27 mg/dL (ref 8–27)
CO2: 26 mmol/L (ref 20–29)
Calcium: 10.1 mg/dL (ref 8.7–10.3)
Chloride: 97 mmol/L (ref 96–106)
Creatinine, Ser: 0.66 mg/dL (ref 0.57–1.00)
Glucose: 100 mg/dL — ABNORMAL HIGH (ref 70–99)
Potassium: 4.1 mmol/L (ref 3.5–5.2)
Sodium: 138 mmol/L (ref 134–144)
eGFR: 93 mL/min/{1.73_m2} (ref >59)

## 2021-12-24 LAB — TSH: TSH: 0.949 u[IU]/mL (ref 0.450–4.500)

## 2021-12-30 ENCOUNTER — Ambulatory Visit: Payer: Medicare Other | Admitting: Internal Medicine

## 2021-12-31 ENCOUNTER — Ambulatory Visit
Admission: RE | Admit: 2021-12-31 | Discharge: 2021-12-31 | Disposition: A | Discharge status: Home / Self Care | Payer: Medicare Other | Source: Ambulatory Visit | Attending: Internal Medicine | Admitting: Internal Medicine

## 2021-12-31 DIAGNOSIS — Z1231 Encounter for screening mammogram for malignant neoplasm of breast: Secondary | ICD-10-CM

## 2022-01-02 ENCOUNTER — Other Ambulatory Visit: Payer: Self-pay | Admitting: Internal Medicine

## 2022-01-02 DIAGNOSIS — R928 Other abnormal and inconclusive findings on diagnostic imaging of breast: Secondary | ICD-10-CM

## 2022-01-07 ENCOUNTER — Ambulatory Visit (HOSPITAL_COMMUNITY): Payer: Medicare Other | Attending: Cardiology

## 2022-01-07 DIAGNOSIS — I480 Paroxysmal atrial fibrillation: Secondary | ICD-10-CM

## 2022-01-07 LAB — ECHOCARDIOGRAM COMPLETE
AR max vel: 1.88 cm2
AV Area VTI: 1.78 cm2
AV Area mean vel: 1.74 cm2
AV Mean grad: 4.2 mmHg
AV Peak grad: 7.1 mmHg
Ao pk vel: 1.34 m/s
Area-P 1/2: 3.99 cm2
MV M vel: 4.77 m/s
MV Peak grad: 91 mmHg
S' Lateral: 2.1 cm

## 2022-01-12 ENCOUNTER — Ambulatory Visit
Admission: RE | Admit: 2022-01-12 | Discharge: 2022-01-12 | Disposition: A | Discharge status: Home / Self Care | Payer: Medicare Other | Source: Ambulatory Visit | Attending: Internal Medicine | Admitting: Internal Medicine

## 2022-01-12 ENCOUNTER — Ambulatory Visit: Payer: Medicare Other

## 2022-01-12 DIAGNOSIS — R928 Other abnormal and inconclusive findings on diagnostic imaging of breast: Secondary | ICD-10-CM

## 2022-01-12 DIAGNOSIS — R922 Inconclusive mammogram: Secondary | ICD-10-CM | POA: Diagnosis not present

## 2022-01-12 NOTE — H&P (View-Only) (Signed)
Cardiology Office Note   Date:  01/13/2022   ID:  Penny Barnett, DOB [birth year], MRN [id number]  PCP:  Madelin Headings, MD  Cardiologist:   Dietrich Pates, MD   F/U of PA flutter  And Fibrillaton  and HTN      History of Present Illness: Penny Barnett is a 71 y.o. female with a history of atrial flutter    S/p TEE /cardioversion in 2016   Also a hx of HTN, HL, asthma The pt was admitted in November 2021 for COVID infeciton   FOund to be in atrial fibrillation    She was also found to be hyperthroid at the time and was placed on methmizole  Underwent cardioversion in 2022  In Sept 2022 wore monitor   Showed no afib    Recomm stopping Eliquias    Started livalo for cholesterol  In Feb 2023,  the pt had a Ca score CT   This showed score of 391.   Myoview after showed no ischemia      The pt was seen by B Bhagat  on December 23, 2021  Back in afb  Placed on Elquis    Since seen has done OK   Denies CP   NO dizzienss   Occasionally feels heart rate beating faster     Does says she had more energy in past    Current Meds  Medication Sig   albuterol (VENTOLIN HFA) 108 (90 Base) MCG/ACT inhaler Inhale 2 puffs into the lungs every 6 (six) hours as needed for wheezing or shortness of breath.   apixaban (ELIQUIS) 5 MG TABS tablet Take 1 tablet (5 mg total) by mouth 2 (two) times daily.   bisoprolol (ZEBETA) 5 MG tablet TAKE 1 TABLET BY MOUTH DAILY   Cholecalciferol (VITAMIN D-3) 125 MCG (5000 UT) TABS Take 5,000 Units by mouth daily.   Coenzyme Q10 (CO Q 10) 100 MG CAPS Take 100 mg by mouth daily.   diclofenac Sodium (VOLTAREN) 1 % GEL Apply 2 g topically daily as needed (Pain).   FLOVENT HFA 110 MCG/ACT inhaler USE 1 TO 2 INHALATIONS BY MOUTH  TWICE DAILY   ipratropium (ATROVENT HFA) 17 MCG/ACT inhaler Inhale 2 puffs into the lungs in the morning and at bedtime.   KLOR-CON M20 20 MEQ tablet TAKE 1 TABLET BY MOUTH EVERY DAY   montelukast (SINGULAIR) 10 MG tablet TAKE 1 TABLET BY MOUTH EVERY  DAY IN THE MORNING   Multiple Vitamins-Minerals (MULTIVITAMIN WITH MINERALS) tablet Take 1 tablet by mouth daily. With Iron   omeprazole (PRILOSEC) 20 MG capsule Take 20 mg by mouth daily.   Pitavastatin Calcium 2 MG TABS Take 1 tablet (2 mg total) by mouth daily. Please keep upcoming appt with Dr. Tenny Craw in September 2023 before anymore refills. Thank you Final attempt   Probiotic Product (PROBIOTIC DAILY PO) Take 1 capsule by mouth daily.   triamterene-hydrochlorothiazide (MAXZIDE-25) 37.5-25 MG tablet TAKE 1/2 TABLET BY MOUTH EVERY DAY   vitamin C (ASCORBIC ACID) 500 MG tablet Take 500 mg by mouth daily.   zinc gluconate 50 MG tablet Take 50 mg by mouth daily.     Allergies:   Crestor [rosuvastatin] and Penicillins   Past Medical History:  Diagnosis Date   Allergy    Anemia    Arthritis    hands   Asthma    as a child and then attack 1996  poss allergic to cat hair. ventilated x 2 . saw Dr  Sharma then .    Atrial flutter (HCC)    a. 12/2014 Echo: EF 60-65%, no rwma, mild MR;  b. S/P TEE/DCCV;  c. CHA2DS2VASc = 3-->Eliquis.   Cataract    Had surgery to remove   GERD (gastroesophageal reflux disease)    2000 taking otc prevacid   evey day.    Hyperlipidemia    Hypertension     3 meds controlled   Kidney stone    passed stone - no surgery required    Past Surgical History:  Procedure Laterality Date   CARDIOVERSION N/A 01/03/2015   Procedure: CARDIOVERSION;  Surgeon: Pricilla Riffle, MD;  Location: [facility] ENDOSCOPY;  Service: Cardiovascular;  Laterality: N/A;   CARDIOVERSION N/A 08/06/2020   Procedure: CARDIOVERSION;  Surgeon: [name], MD;  Location: [facility] ENDOSCOPY;  Service: Cardiovascular;  Laterality: N/A;   CATARACT EXTRACTION Bilateral 10/14/2010   both eyes   COLONOSCOPY  02/2011   [facility] SURGERY Bilateral    x 2 - frozen shoulder rt 2006, 2000 left    TEE WITHOUT CARDIOVERSION N/A 01/03/2015   Procedure: TRANSESOPHAGEAL ECHOCARDIOGRAM (TEE);  Surgeon: Pricilla Riffle, MD;  Location: [facility] ENDOSCOPY;  Service: Cardiovascular;  Laterality: N/A;   TONSILLECTOMY       Social History:  The patient  reports that she quit smoking about 37 years ago. Her smoking use included cigarettes. She has a 20.00 pack-year smoking history. She has never used smokeless tobacco. She reports that she does not drink alcohol and does not use drugs.   Family History:  The patient's family history includes Alcohol abuse in her father; Bladder Cancer in her mother; Breast cancer in her maternal grandmother and mother; Uterine cancer in her sister.    ROS:  Please see the history of present illness. All other systems are reviewed and  Negative to the above problem except as noted.    PHYSICAL EXAM: VS:  BP 118/76   Pulse 70   Ht 5' 4.5" (1.638 m)   Wt 178 lb 6.4 oz (80.9 kg)   SpO2 96%   BMI 30.15 kg/m   GEN: Well nourished, well developed, in no acute distress  HEENT: normal  Neck: JVP normal   No carotid bruits Cardiac: Irreg irreg  ; no murmurs   No LE  edema  Respiratory:  clear to auscultation  GI: soft, nontender, nondistended, + BS  No hepatomegaly  MS: no deformity Moving all extremities   Skin: warm and dry, no rash Neuro:  Strength and sensation are intact Psych: euthymic mood, full affect   EKG:  EKG isordered today.     Atrial fibrllation     Testing     The following studies were reviewed today: Stress test 11/2021 The study is normal. The study is low risk.   No ST deviation was noted.   Left ventricular function is normal. Nuclear stress EF: 52 %. The left ventricular ejection fraction is mildly decreased (45-54%). End diastolic cavity size is normal.   Prior study not available for comparison.   Monitor 02/2021 Patch Wear Time:  7 days and 14 hours (2022-08-20T10:39:16-0400 to 2022-08-28T00:54:20-0400)   Predominant rhythm:  Sinus rhythm   Overall heart rates 52 to 174 bpm   Average HR 67 bpm   Intermttent SVT, longest lasting 21.4 sec (130  bpm)   Rare PVCs,   Occasional PVCs     No diary entries/triggered events      Echo 05/2020 1. Left ventricular  ejection fraction, by estimation, is 60 to 65%. The  left ventricle has normal function. Left ventricular diastolic function  could not be evaluated.   2. Right ventricular systolic function is normal. The right ventricular  size is normal. There is normal pulmonary artery systolic pressure.   3. The mitral valve is grossly normal. Mild mitral valve regurgitation.   4. The aortic valve is normal in structure. Aortic valve regurgitation is  not visualized. No aortic stenosis is present.  Lipid Panel    Component Value Date/Time   CHOL 276 (H) 02/04/2021 1147   CHOL 223 (H) 07/29/2020 1208   TRIG 164.0 (H) 02/04/2021 1147   HDL 61.80 02/04/2021 1147   HDL 58 07/29/2020 1208   CHOLHDL 4 02/04/2021 1147   VLDL 32.8 02/04/2021 1147   LDLCALC 181 (H) 02/04/2021 1147   LDLCALC 133 (H) 07/29/2020 1208   LDLCALC 170 (H) 01/23/2020 1022   LDLDIRECT 129.0 11/28/2018 1048      Wt Readings from Last 3 Encounters:  01/13/22 178 lb 6.4 oz (80.9 kg)  12/23/21 171 lb (77.6 kg)  12/02/21 177 lb (80.3 kg)      ASSESSMENT AND PLAN:  1  Atrial fibrillation   The pt was found to be in atrial fibrillation back in Nov 2021 when sick with COVID   Note that her thyroid was subtley off at that time   Now improved and off meds The pt underwent cardioversion in Feb 2022     Was without documented afib until seen in July by B Bhagat    Placed on Eliquis    Remains in afib       She says she feels like she has less energy, gets winded easier   I would recomm repeat cardioversion and then close follow up to see how feels after   If truly feels better, will pursue rhythm control   Labs today    Set up for after 01/22/22      2  CAD  no symptoms of angina     HL  Lpids  Will check today   3  HTN  BP is adequately controlled    Follow up at end of September after cardioversion      Signed, Dietrich Pates, MD  01/13/2022 1:08 PM    [facility] [address] [place], [place], [place]  [address] Phone: [phone]; Fax: [phone]

## 2022-01-12 NOTE — Progress Notes (Unsigned)
Cardiology Office Note   Date:  01/13/2022   ID:  Penny Barnett, DOB [birth year], MRN [id number]  PCP:  Madelin Headings, MD  Cardiologist:   Dietrich Pates, MD   F/U of PA flutter  And Fibrillaton  and HTN      History of Present Illness: Penny Barnett is a 71 y.o. female with a history of atrial flutter    S/p TEE /cardioversion in 2016   Also a hx of HTN, HL, asthma The pt was admitted in November 2021 for COVID infeciton   FOund to be in atrial fibrillation    She was also found to be hyperthroid at the time and was placed on methmizole  Underwent cardioversion in 2022  In Sept 2022 wore monitor   Showed no afib    Recomm stopping Eliquias    Started livalo for cholesterol  In Feb 2023,  the pt had a Ca score CT   This showed score of 391.   Myoview after showed no ischemia      The pt was seen by B Bhagat  on December 23, 2021  Back in afb  Placed on Elquis    Since seen has done OK   Denies CP   NO dizzienss   Occasionally feels heart rate beating faster     Does says she had more energy in past    Current Meds  Medication Sig   albuterol (VENTOLIN HFA) 108 (90 Base) MCG/ACT inhaler Inhale 2 puffs into the lungs every 6 (six) hours as needed for wheezing or shortness of breath.   apixaban (ELIQUIS) 5 MG TABS tablet Take 1 tablet (5 mg total) by mouth 2 (two) times daily.   bisoprolol (ZEBETA) 5 MG tablet TAKE 1 TABLET BY MOUTH DAILY   Cholecalciferol (VITAMIN D-3) 125 MCG (5000 UT) TABS Take 5,000 Units by mouth daily.   Coenzyme Q10 (CO Q 10) 100 MG CAPS Take 100 mg by mouth daily.   diclofenac Sodium (VOLTAREN) 1 % GEL Apply 2 g topically daily as needed (Pain).   FLOVENT HFA 110 MCG/ACT inhaler USE 1 TO 2 INHALATIONS BY MOUTH  TWICE DAILY   ipratropium (ATROVENT HFA) 17 MCG/ACT inhaler Inhale 2 puffs into the lungs in the morning and at bedtime.   KLOR-CON M20 20 MEQ tablet TAKE 1 TABLET BY MOUTH EVERY DAY   montelukast (SINGULAIR) 10 MG tablet TAKE 1 TABLET BY MOUTH EVERY  DAY IN THE MORNING   Multiple Vitamins-Minerals (MULTIVITAMIN WITH MINERALS) tablet Take 1 tablet by mouth daily. With Iron   omeprazole (PRILOSEC) 20 MG capsule Take 20 mg by mouth daily.   Pitavastatin Calcium 2 MG TABS Take 1 tablet (2 mg total) by mouth daily. Please keep upcoming appt with Dr. Tenny Craw in September 2023 before anymore refills. Thank you Final attempt   Probiotic Product (PROBIOTIC DAILY PO) Take 1 capsule by mouth daily.   triamterene-hydrochlorothiazide (MAXZIDE-25) 37.5-25 MG tablet TAKE 1/2 TABLET BY MOUTH EVERY DAY   vitamin C (ASCORBIC ACID) 500 MG tablet Take 500 mg by mouth daily.   zinc gluconate 50 MG tablet Take 50 mg by mouth daily.     Allergies:   Crestor [rosuvastatin] and Penicillins   Past Medical History:  Diagnosis Date   Allergy    Anemia    Arthritis    hands   Asthma    as a child and then attack 1996  poss allergic to cat hair. ventilated x 2 . saw Dr  Sharma then .    Atrial flutter (HCC)    a. 12/2014 Echo: EF 60-65%, no rwma, mild MR;  b. S/P TEE/DCCV;  c. CHA2DS2VASc = 3-->Eliquis.   Cataract    Had surgery to remove   GERD (gastroesophageal reflux disease)    2000 taking otc prevacid   evey day.    Hyperlipidemia    Hypertension     3 meds controlled   Kidney stone    passed stone - no surgery required    Past Surgical History:  Procedure Laterality Date   CARDIOVERSION N/A 01/03/2015   Procedure: CARDIOVERSION;  Surgeon: Disney Ruggiero V, MD;  Location: [facility] ENDOSCOPY;  Service: Cardiovascular;  Laterality: N/A;   CARDIOVERSION N/A 08/06/2020   Procedure: CARDIOVERSION;  Surgeon: Hilty, Kenneth C, MD;  Location: [facility] ENDOSCOPY;  Service: Cardiovascular;  Laterality: N/A;   CATARACT EXTRACTION Bilateral 10/14/2010   both eyes   COLONOSCOPY  02/2011   [facility]   SHOULDER SURGERY Bilateral    x 2 - frozen shoulder rt 2006, 2000 left    TEE WITHOUT CARDIOVERSION N/A 01/03/2015   Procedure: TRANSESOPHAGEAL ECHOCARDIOGRAM (TEE);  Surgeon: Wess Baney V, MD;  Location: [facility] ENDOSCOPY;  Service: Cardiovascular;  Laterality: N/A;   TONSILLECTOMY       Social History:  The patient  reports that she quit smoking about 37 years ago. Her smoking use included cigarettes. She has a 20.00 pack-year smoking history. She has never used smokeless tobacco. She reports that she does not drink alcohol and does not use drugs.   Family History:  The patient's family history includes Alcohol abuse in her father; Bladder Cancer in her mother; Breast cancer in her maternal grandmother and mother; Uterine cancer in her sister.    ROS:  Please see the history of present illness. All other systems are reviewed and  Negative to the above problem except as noted.    PHYSICAL EXAM: VS:  BP 118/76   Pulse 70   Ht 5' 4.5" (1.638 m)   Wt 178 lb 6.4 oz (80.9 kg)   SpO2 96%   BMI 30.15 kg/m   GEN: Well nourished, well developed, in no acute distress  HEENT: normal  Neck: JVP normal   No carotid bruits Cardiac: Irreg irreg  ; no murmurs   No LE  edema  Respiratory:  clear to auscultation  GI: soft, nontender, nondistended, + BS  No hepatomegaly  MS: no deformity Moving all extremities   Skin: warm and dry, no rash Neuro:  Strength and sensation are intact Psych: euthymic mood, full affect   EKG:  EKG isordered today.     Atrial fibrllation     Testing     The following studies were reviewed today: Stress test 11/2021 The study is normal. The study is low risk.   No ST deviation was noted.   Left ventricular function is normal. Nuclear stress EF: 52 %. The left ventricular ejection fraction is mildly decreased (45-54%). End diastolic cavity size is normal.   Prior study not available for comparison.   Monitor 02/2021 Patch Wear Time:  7 days and 14 hours (2022-08-20T10:39:16-0400 to 2022-08-28T00:54:20-0400)   Predominant rhythm:  Sinus rhythm   Overall heart rates 52 to 174 bpm   Average HR 67 bpm   Intermttent SVT, longest lasting 21.4 sec (130  bpm)   Rare PVCs,   Occasional PVCs     No diary entries/triggered events      Echo 05/2020 1. Left ventricular   ejection fraction, by estimation, is 60 to 65%. The  left ventricle has normal function. Left ventricular diastolic function  could not be evaluated.   2. Right ventricular systolic function is normal. The right ventricular  size is normal. There is normal pulmonary artery systolic pressure.   3. The mitral valve is grossly normal. Mild mitral valve regurgitation.   4. The aortic valve is normal in structure. Aortic valve regurgitation is  not visualized. No aortic stenosis is present.  Lipid Panel    Component Value Date/Time   CHOL 276 (H) 02/04/2021 1147   CHOL 223 (H) 07/29/2020 1208   TRIG 164.0 (H) 02/04/2021 1147   HDL 61.80 02/04/2021 1147   HDL 58 07/29/2020 1208   CHOLHDL 4 02/04/2021 1147   VLDL 32.8 02/04/2021 1147   LDLCALC 181 (H) 02/04/2021 1147   LDLCALC 133 (H) 07/29/2020 1208   LDLCALC 170 (H) 01/23/2020 1022   LDLDIRECT 129.0 11/28/2018 1048      Wt Readings from Last 3 Encounters:  01/13/22 178 lb 6.4 oz (80.9 kg)  12/23/21 171 lb (77.6 kg)  12/02/21 177 lb (80.3 kg)      ASSESSMENT AND PLAN:  1  Atrial fibrillation   The pt was found to be in atrial fibrillation back in Nov 2021 when sick with COVID   Note that her thyroid was subtley off at that time   Now improved and off meds The pt underwent cardioversion in Feb 2022     Was without documented afib until seen in July by B Bhagat    Placed on Eliquis    Remains in afib       She says she feels like she has less energy, gets winded easier   I would recomm repeat cardioversion and then close follow up to see how feels after   If truly feels better, will pursue rhythm control   Labs today    Set up for after 01/22/22      2  CAD  no symptoms of angina     HL  Lpids  Will check today   3  HTN  BP is adequately controlled    Follow up at end of September after cardioversion      Signed, Dietrich Pates, MD  01/13/2022 1:08 PM    [facility] [address] [place], [place], [place]  [address] Phone: [phone]; Fax: [phone]

## 2022-01-13 ENCOUNTER — Ambulatory Visit: Payer: Medicare Other | Admitting: Internal Medicine

## 2022-01-13 ENCOUNTER — Encounter: Payer: Self-pay | Admitting: Internal Medicine

## 2022-01-13 VITALS — BP 118/76 | HR 70 | Ht 64.5 in | Wt 178.4 lb

## 2022-01-13 DIAGNOSIS — Z01810 Encounter for preprocedural cardiovascular examination: Secondary | ICD-10-CM | POA: Diagnosis not present

## 2022-01-13 DIAGNOSIS — Z79899 Other long term (current) drug therapy: Secondary | ICD-10-CM | POA: Diagnosis not present

## 2022-01-13 DIAGNOSIS — I4892 Unspecified atrial flutter: Secondary | ICD-10-CM | POA: Diagnosis not present

## 2022-01-13 DIAGNOSIS — I480 Paroxysmal atrial fibrillation: Secondary | ICD-10-CM | POA: Diagnosis not present

## 2022-01-13 DIAGNOSIS — I10 Essential (primary) hypertension: Secondary | ICD-10-CM | POA: Diagnosis not present

## 2022-01-13 DIAGNOSIS — E785 Hyperlipidemia, unspecified: Secondary | ICD-10-CM

## 2022-01-13 MED ORDER — ATROVENT HFA 17 MCG/ACT IN AERS: 2.0000 | INHALATION_SPRAY | Freq: Two times a day (BID) | RESPIRATORY_TRACT | 5 refills | Status: DC

## 2022-01-13 NOTE — Patient Instructions (Addendum)
Medication Instructions:   *If you need a refill on your cardiac medications before your next appointment, please call your pharmacy*   Lab Work: NMR, bmet, CBC on January 20, 2022 anytime between 7:30 am and 4:30 pm  If you have labs (blood work) drawn today and your tests are completely normal, you will receive your results only by: MyChart Message (if you have MyChart) OR A paper copy in the mail If you have any lab test that is abnormal or we need to change your treatment, we will call you to review the results.   Testing/Procedures: Your physician has recommended that you have a Cardioversion (DCCV). Electrical Cardioversion uses a jolt of electricity to your heart either through paddles or wired patches attached to your chest. This is a controlled, usually prescheduled, procedure. Defibrillation is done under light anesthesia in the hospital, and you usually go home the day of the procedure. This is done to get your heart back into a normal rhythm. You are not awake for the procedure. Please see the instruction sheet given to you today.   Follow-Up: At [facility], you and your health needs are our priority.  As part of our continuing mission to provide you with exceptional heart care, we have created designated Provider Care Teams.  These Care Teams include your primary Cardiologist (physician) and Advanced Practice Providers (APPs -  Physician Assistants and Nurse Practitioners) who all work together to provide you with the care you need, when you need it.  We recommend signing up for the patient portal called "MyChart".  Sign up information is provided on this After Visit Summary.  MyChart is used to connect with patients for Virtual Visits (Telemedicine).  Patients are able to view lab/test results, encounter notes, upcoming appointments, etc.  Non-urgent messages can be sent to your provider as well.   To learn more about what you can do with MyChart, go to [email or web address].     Your next appointment:   6 week(s)  The format for your next appointment:   In Person  Provider:   Dietrich Pates, MD     Other Instructions   Important Information About Sugar

## 2022-01-18 ENCOUNTER — Other Ambulatory Visit: Payer: Self-pay | Admitting: Internal Medicine

## 2022-01-20 ENCOUNTER — Encounter: Payer: Self-pay | Admitting: Internal Medicine

## 2022-01-20 ENCOUNTER — Encounter (HOSPITAL_COMMUNITY): Payer: Self-pay | Admitting: Cardiology

## 2022-01-20 ENCOUNTER — Other Ambulatory Visit: Payer: Medicare Other

## 2022-01-20 DIAGNOSIS — Z79899 Other long term (current) drug therapy: Secondary | ICD-10-CM | POA: Diagnosis not present

## 2022-01-20 DIAGNOSIS — L989 Disorder of the skin and subcutaneous tissue, unspecified: Secondary | ICD-10-CM

## 2022-01-20 DIAGNOSIS — Z01810 Encounter for preprocedural cardiovascular examination: Secondary | ICD-10-CM

## 2022-01-20 DIAGNOSIS — I4892 Unspecified atrial flutter: Secondary | ICD-10-CM

## 2022-01-20 DIAGNOSIS — I10 Essential (primary) hypertension: Secondary | ICD-10-CM

## 2022-01-20 DIAGNOSIS — E785 Hyperlipidemia, unspecified: Secondary | ICD-10-CM

## 2022-01-20 DIAGNOSIS — I480 Paroxysmal atrial fibrillation: Secondary | ICD-10-CM

## 2022-01-21 ENCOUNTER — Telehealth: Payer: Self-pay

## 2022-01-21 LAB — BASIC METABOLIC PANEL
BUN/Creatinine Ratio: 32 — ABNORMAL HIGH (ref 12–28)
BUN: 22 mg/dL (ref 8–27)
CO2: 24 mmol/L (ref 20–29)
Calcium: 9.6 mg/dL (ref 8.7–10.3)
Chloride: 98 mmol/L (ref 96–106)
Creatinine, Ser: 0.68 mg/dL (ref 0.57–1.00)
Glucose: 110 mg/dL — ABNORMAL HIGH (ref 70–99)
Potassium: 3.4 mmol/L — ABNORMAL LOW (ref 3.5–5.2)
Sodium: 140 mmol/L (ref 134–144)
eGFR: 92 mL/min/{1.73_m2} (ref >59)

## 2022-01-21 LAB — NMR, LIPOPROFILE
Cholesterol, Total: 246 mg/dL — ABNORMAL HIGH (ref 100–199)
HDL Particle Number: 44.2 umol/L (ref >=30.5)
HDL-C: 62 mg/dL (ref >39)
LDL Particle Number: 2096 nmol/L — ABNORMAL HIGH (ref <1000)
LDL Size: 20.8 nm (ref >20.5)
LDL-C (NIH Calc): 168 mg/dL — ABNORMAL HIGH (ref 0–99)
LP-IR Score: 61 — ABNORMAL HIGH (ref <=45)
Small LDL Particle Number: 858 nmol/L — ABNORMAL HIGH (ref <=527)
Triglycerides: 91 mg/dL (ref 0–149)

## 2022-01-21 LAB — CBC
Hematocrit: 38.4 % (ref 34.0–46.6)
Hemoglobin: 12.9 g/dL (ref 11.1–15.9)
MCH: 28.8 pg (ref 26.6–33.0)
MCHC: 33.6 g/dL (ref 31.5–35.7)
MCV: 86 fL (ref 79–97)
NRBC: 1 % — ABNORMAL HIGH (ref 0–0)
Platelets: 227 10*3/uL (ref 150–450)
RBC: 4.48 x10E6/uL (ref 3.77–5.28)
RDW: 14 % (ref 11.7–15.4)
WBC: 5.1 10*3/uL (ref 3.4–10.8)

## 2022-01-21 MED ORDER — POTASSIUM CHLORIDE CRYS ER 20 MEQ PO TBCR: EXTENDED_RELEASE_TABLET | ORAL | 2 refills | Status: DC

## 2022-01-21 NOTE — Telephone Encounter (Signed)
-----   Message from Dietrich Pates V, MD sent at 01/21/2022  3:38 PM EDT ----- Potassium is slightly low   She is on K supplement     Switch to 1 alternating with 2 daily   CBC is OK Lipids are much higher than before    Is she taking Livalo ?

## 2022-01-21 NOTE — Telephone Encounter (Signed)
Okay to refer? 

## 2022-01-21 NOTE — Telephone Encounter (Signed)
Pt advised her lab results.... she is not using Livalo will forward to Dr Tenny Craw.

## 2022-01-22 NOTE — Telephone Encounter (Signed)
Ok to do referral to dermatology  ,[facility] dermatology  may have capacity

## 2022-01-27 ENCOUNTER — Other Ambulatory Visit: Payer: Self-pay

## 2022-01-27 ENCOUNTER — Encounter (HOSPITAL_COMMUNITY)
Admission: RE | Disposition: A | Discharge status: Home / Self Care | Payer: Self-pay | Source: Home / Self Care | Attending: Cardiology

## 2022-01-27 ENCOUNTER — Encounter (HOSPITAL_COMMUNITY): Payer: Self-pay | Admitting: Cardiology

## 2022-01-27 ENCOUNTER — Ambulatory Visit (HOSPITAL_COMMUNITY)
Admission: RE | Admit: 2022-01-27 | Discharge: 2022-01-27 | Disposition: A | Discharge status: Home / Self Care | Payer: Medicare Other | Attending: Cardiology | Admitting: Cardiology

## 2022-01-27 ENCOUNTER — Ambulatory Visit (HOSPITAL_BASED_OUTPATIENT_CLINIC_OR_DEPARTMENT_OTHER): Payer: Medicare Other | Admitting: Certified Registered Nurse Anesthetist

## 2022-01-27 ENCOUNTER — Ambulatory Visit (HOSPITAL_COMMUNITY): Payer: Medicare Other | Admitting: Certified Registered Nurse Anesthetist

## 2022-01-27 DIAGNOSIS — I10 Essential (primary) hypertension: Secondary | ICD-10-CM | POA: Insufficient documentation

## 2022-01-27 DIAGNOSIS — E785 Hyperlipidemia, unspecified: Secondary | ICD-10-CM | POA: Diagnosis not present

## 2022-01-27 DIAGNOSIS — J45909 Unspecified asthma, uncomplicated: Secondary | ICD-10-CM

## 2022-01-27 DIAGNOSIS — Z7901 Long term (current) use of anticoagulants: Secondary | ICD-10-CM | POA: Insufficient documentation

## 2022-01-27 DIAGNOSIS — I4891 Unspecified atrial fibrillation: Secondary | ICD-10-CM

## 2022-01-27 DIAGNOSIS — I2510 Atherosclerotic heart disease of native coronary artery without angina pectoris: Secondary | ICD-10-CM | POA: Diagnosis not present

## 2022-01-27 DIAGNOSIS — Z87891 Personal history of nicotine dependence: Secondary | ICD-10-CM

## 2022-01-27 DIAGNOSIS — I4819 Other persistent atrial fibrillation: Secondary | ICD-10-CM | POA: Diagnosis not present

## 2022-01-27 HISTORY — PX: CARDIOVERSION: SHX1299

## 2022-01-27 SURGERY — CARDIOVERSION
Anesthesia: General

## 2022-01-27 MED ORDER — SODIUM CHLORIDE 0.9 % IV SOLN: INTRAVENOUS | Status: DC

## 2022-01-27 MED ORDER — LIDOCAINE 2% (20 MG/ML) 5 ML SYRINGE
INTRAMUSCULAR | Status: DC | PRN
  Administered 2022-01-27: 100 mg via INTRAVENOUS

## 2022-01-27 MED ORDER — PROPOFOL 10 MG/ML IV BOLUS
INTRAVENOUS | Status: DC | PRN
  Administered 2022-01-27: 50 mg via INTRAVENOUS

## 2022-01-27 NOTE — Anesthesia Postprocedure Evaluation (Signed)
Anesthesia Post Note  Patient: Penny Barnett  Procedure(s) Performed: CARDIOVERSION     Patient location during evaluation: Endoscopy Anesthesia Type: General Level of consciousness: awake and alert Pain management: pain level controlled Vital Signs Assessment: post-procedure vital signs reviewed and stable Respiratory status: spontaneous breathing, nonlabored ventilation and respiratory function stable Cardiovascular status: blood pressure returned to baseline and stable Postop Assessment: no apparent nausea or vomiting Anesthetic complications: no   No notable events documented.  Last Vitals:  Vitals:   01/27/22 0930 01/27/22 0940  BP: 109/78 117/77  Pulse: 60 60  Resp: 19 19  Temp:    SpO2: 95% 96%    Last Pain:  Vitals:   01/27/22 0940  TempSrc:   PainSc: 0-No pain                 Lucretia Kern

## 2022-01-27 NOTE — Interval H&P Note (Signed)
History and Physical Interval Note:  01/27/2022 7:56 AM  Penny Barnett  has presented today for surgery, with the diagnosis of atrial fibrillation.  The various methods of treatment have been discussed with the patient and family. After consideration of risks, benefits and other options for treatment, the patient has consented to  Procedure(s): CARDIOVERSION (N/A) as a surgical intervention.  The patient's history has been reviewed, patient examined, no change in status, stable for surgery.  I have reviewed the patient's chart and labs.  Questions were answered to the patient's satisfaction.     Meriam Sprague

## 2022-01-27 NOTE — Anesthesia Preprocedure Evaluation (Signed)
Anesthesia Evaluation  Patient identified by MRN, date of birth, ID band Patient awake    Reviewed: Allergy & Precautions, NPO status , Patient's Chart, lab work & pertinent test results  History of Anesthesia Complications Negative for: history of anesthetic complications  Airway Mallampati: II  TM Distance: >3 FB Neck ROM: Full    Dental   Pulmonary asthma , former smoker,    Pulmonary exam normal        Cardiovascular hypertension, + dysrhythmias Atrial Fibrillation  Rhythm:Irregular Rate:Normal   Echo 01/07/22:  1. Left ventricular ejection fraction, by estimation, is 60 to 65%. Left ventricular ejection fraction by 3D volume is 63 %. The left ventricle has normal function. The left ventricle has no regional wall motion abnormalities. There is mild asymmetric  left ventricular hypertrophy of the basal-septal segment. Left ventricular diastolic parameters are indeterminate. The average left ventricular global longitudinal strain is -22.4 %. 2. Right ventricular systolic function is normal. The right ventricular size is mildly enlarged. There is mildly elevated pulmonary artery systolic pressure. The estimated right ventricular systolic pressure is 40.2 mmHg. 3. Left atrial size was severely dilated. 4. Right atrial size was mildly dilated. 5. The mitral valve is normal in structure. Mild mitral valve regurgitation. No evidence of mitral stenosis. 6. Tricuspid valve regurgitation is moderate. 7. The aortic valve is tricuspid. Aortic valve regurgitation is trivial. No aortic stenosis is present. 8. The inferior vena cava is normal in size with greater than 50% respiratory variability, suggesting right atrial pressure of 3 mmHg.   Neuro/Psych negative neurological ROS     GI/Hepatic Neg liver ROS, GERD  ,  Endo/Other  negative endocrine ROS  Renal/GU negative Renal ROS  negative genitourinary    Musculoskeletal negative musculoskeletal ROS (+)   Abdominal   Peds  Hematology negative hematology ROS (+)   Anesthesia Other Findings   Reproductive/Obstetrics                             Anesthesia Physical Anesthesia Plan  ASA: 3  Anesthesia Plan: General   Post-op Pain Management: Minimal or no pain anticipated   Induction: Intravenous  PONV Risk Score and Plan: 3 and TIVA and Treatment may vary due to age or medical condition  Airway Management Planned: Mask  Additional Equipment: None  Intra-op Plan:   Post-operative Plan:   Informed Consent: I have reviewed the patients History and Physical, chart, labs and discussed the procedure including the risks, benefits and alternatives for the proposed anesthesia with the patient or authorized representative who has indicated his/her understanding and acceptance.       Plan Discussed with:   Anesthesia Plan Comments:         Anesthesia Quick Evaluation

## 2022-01-27 NOTE — CV Procedure (Signed)
Procedure: Electrical Cardioversion Indications:  Atrial Fibrillation  Procedure Details:  Consent: Risks of procedure as well as the alternatives and risks of each were explained to the (patient/caregiver).  Consent for procedure obtained.  Time Out: Verified patient identification, verified procedure, site/side was marked, verified correct patient position, special equipment/implants available, medications/allergies/relevent history reviewed, required imaging and test results available. PERFORMED.  Patient placed on cardiac monitor, pulse oximetry, supplemental oxygen as necessary.  Sedation given:  Propofol 50mg ; lidocaine 100mg  Pacer pads placed anterior and posterior chest.  Cardioverted 1 time(s).  Cardioversion with synchronized biphasic 150J shock.  Evaluation: Findings: Post procedure EKG shows: NSR Complications: None Patient did tolerate procedure well.  Time Spent Directly with the Patient:    01/27/2022, 9:17 AM

## 2022-01-27 NOTE — Transfer of Care (Signed)
Immediate Anesthesia Transfer of Care Note  Patient: Penny Barnett  Procedure(s) Performed: CARDIOVERSION  Patient Location: Endoscopy Unit  Anesthesia Type:General  Level of Consciousness: drowsy, patient cooperative and responds to stimulation  Airway & Oxygen Therapy: Patient Spontanous Breathing  Post-op Assessment: Report given to RN and Post -op Vital signs reviewed and stable  Post vital signs: Reviewed and stable  Last Vitals:  Vitals Value Taken Time  BP    Temp    Pulse    Resp    SpO2      Last Pain:  Vitals:   01/27/22 0826  TempSrc: Temporal  PainSc: 0-No pain         Complications: No notable events documented.

## 2022-01-28 ENCOUNTER — Encounter (HOSPITAL_COMMUNITY): Payer: Self-pay | Admitting: Cardiology

## 2022-02-17 ENCOUNTER — Ambulatory Visit: Payer: Medicare Other | Admitting: Internal Medicine

## 2022-02-17 ENCOUNTER — Encounter: Payer: Medicare Other | Admitting: Internal Medicine

## 2022-02-25 IMAGING — DX DG CHEST 2V
2 series · 2 of 2 positions shown · non-contrast
Comparison: 10/02/2020.  12/30/2014.  CT 12/30/2014.

CLINICAL DATA: Shortness of breath.

EXAM:
CHEST - 2 VIEW

[chest pa]
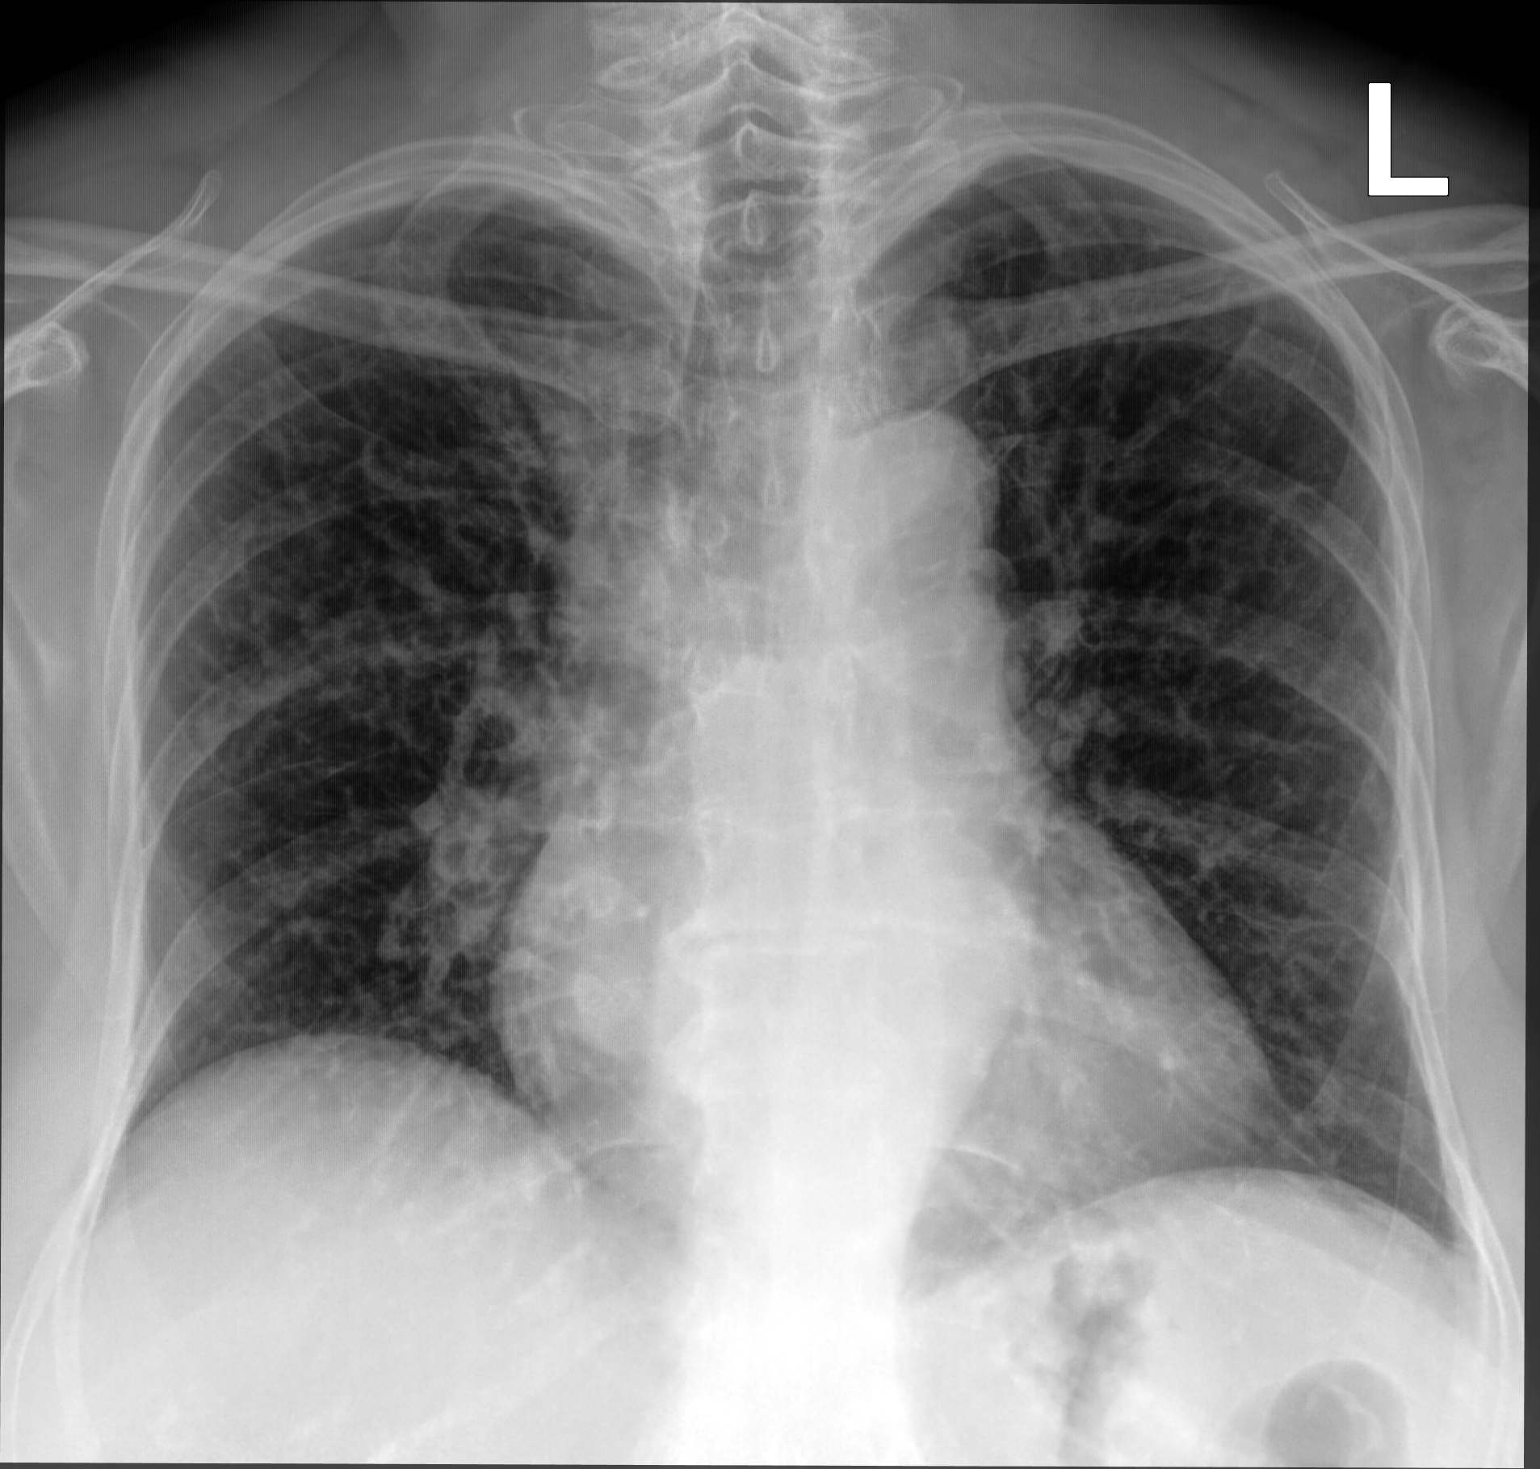

[chest lat]
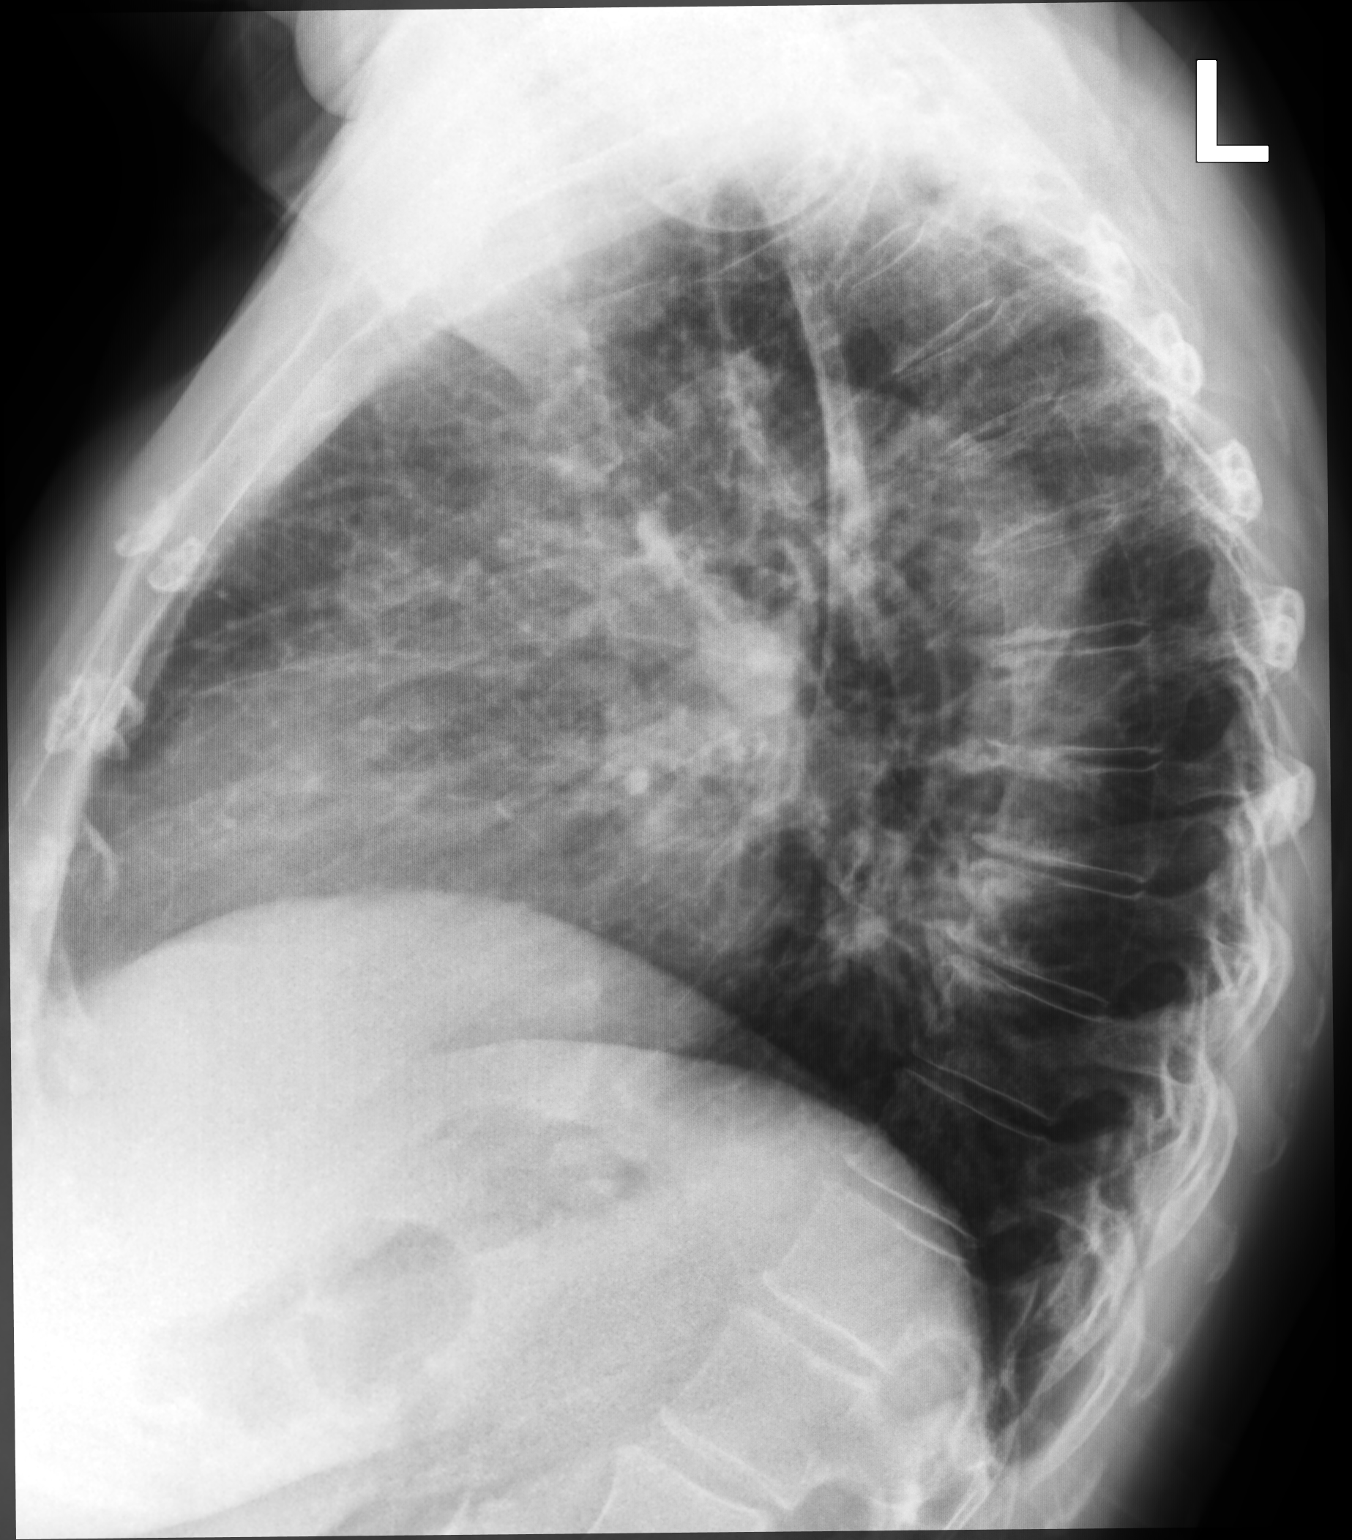

[2 of 2 positions shown; findings below may reference images not displayed]

FINDINGS: Cardiomegaly. Normal pulmonary vascularity. Mild chronic
interstitial changes noted throughout both lungs. Similar findings
noted on prior studies. No focal alveolar infiltrate. No pleural
effusion or pneumothorax. Degenerative change thoracic spine.
IMPRESSION: 1.  Cardiomegaly.  No pulmonary venous congestion.

2.  Chronic interstitial lung disease.  No acute focal infiltrate.

## 2022-03-03 NOTE — Progress Notes (Unsigned)
Cardiology Office Note   Date:  03/04/2022   ID:  Penny Barnett, DOB [birth year], MRN [id number]  PCP:  Madelin Headings, MD  Cardiologist:   Dietrich Pates, MD   F/U of paroxysmal atrial flutter, atrial fibrillaton  and HTN      History of Present Illness: Penny Barnett is a 71 y.o. female with a history of atrial flutter    S/p TEE /cardioversion in 2016   Also a hx of HTN, HL, asthma In Nov 2021 she had COVID   Hospitalized   Hyperthyroid at time, treated  methimazole.   Was in afib   Cardi0verted     Monitor in Sept 2022 showed no recurrence   Eliquis stopped In July 2023 she  was back in afib   Felt heart beating faster, felt she had a little less energy.   Placed on Eliquis   In Aug 2023 she underwent DC cardioversion.   The pt says that she felt like she went back into afib about 1 week after the cardioversion  Since that time she notes no change in her breathing, no change in her ability to do things, no real change in her energy  level She takes her pulse at home fairly frequently   HR 60s to low 100s mostly, not like when hyperthyroid.     Pt also with CAD on CT scan   CA score in 2024 was 391     She has tried statins, she is intolerant to them         Current Meds  Medication Sig   acetaminophen (TYLENOL) 325 MG tablet Take 325-650 mg by mouth at bedtime as needed for moderate pain.   albuterol (VENTOLIN HFA) 108 (90 Base) MCG/ACT inhaler Inhale 2 puffs into the lungs every 6 (six) hours as needed for wheezing or shortness of breath.   apixaban (ELIQUIS) 5 MG TABS tablet Take 1 tablet (5 mg total) by mouth 2 (two) times daily.   bisoprolol (ZEBETA) 5 MG tablet TAKE 1 TABLET BY MOUTH DAILY   Cholecalciferol (VITAMIN D-3) 125 MCG (5000 UT) TABS Take 5,000 Units by mouth daily.   Collagen-Boron-Hyaluronic Acid (MOVE FREE ULTRA JOINT HEALTH PO) Take 1 tablet by mouth daily.   diclofenac Sodium (VOLTAREN) 1 % GEL Apply 2 g topically daily as needed (Pain).    diphenhydramine-acetaminophen (TYLENOL PM) 25-500 MG TABS tablet Take 1 tablet by mouth at bedtime as needed (sleep).   FLOVENT HFA 110 MCG/ACT inhaler USE 1 TO 2 INHALATIONS BY MOUTH  TWICE DAILY   ipratropium (ATROVENT HFA) 17 MCG/ACT inhaler Inhale 2 puffs into the lungs in the morning and at bedtime.   montelukast (SINGULAIR) 10 MG tablet TAKE 1 TABLET BY MOUTH EVERY DAY IN THE MORNING   Multiple Vitamins-Iron (MULTIVITAMIN/IRON PO) Take 1 tablet by mouth daily.   Multiple Vitamins-Minerals (LUTEIN-ZEAXANTHIN PO) Take 1 tablet by mouth daily.   omeprazole (PRILOSEC) 20 MG capsule Take 20 mg by mouth daily.   Pitavastatin Calcium 2 MG TABS Take 1 tablet (2 mg total) by mouth daily. Please keep upcoming appt with Dr. Tenny Craw in September 2023 before anymore refills. Thank you Final attempt   potassium chloride SA (KLOR-CON M20) 20 MEQ tablet Take one tablet every other day alternating with two tablets on the opposite days.   Tetrahydrozoline HCl (VISINE OP) Place 1 drop into both eyes daily as needed (redness).   triamterene-hydrochlorothiazide (MAXZIDE-25) 37.5-25 MG tablet TAKE 1/2 TABLET BY MOUTH EVERY DAY  vitamin B-12 (CYANOCOBALAMIN) 500 MCG tablet Take 500 mcg by mouth daily.     Allergies:   Statins and Penicillins   Past Medical History:  Diagnosis Date   Allergy    Anemia    Arthritis    hands   Asthma    as a child and then attack 1996  poss allergic to cat hair. ventilated x 2 . saw Dr [place] Callas then .    Atrial flutter (HCC)    a. 12/2014 Echo: EF 60-65%, no rwma, mild MR;  b. S/P TEE/DCCV;  c. CHA2DS2VASc = 3-->Eliquis.   Cataract    Had surgery to remove   GERD (gastroesophageal reflux disease)    2000 taking otc prevacid   evey day.    Hyperlipidemia    Hypertension     3 meds controlled   Kidney stone    passed stone - no surgery required    Past Surgical History:  Procedure Laterality Date   CARDIOVERSION N/A 01/03/2015   Procedure: CARDIOVERSION;  Surgeon: Pricilla Riffle, MD;  Location: [facility] ENDOSCOPY;  Service: Cardiovascular;  Laterality: N/A;   CARDIOVERSION N/A 08/06/2020   Procedure: CARDIOVERSION;  Surgeon: [name], MD;  Location: [facility] ENDOSCOPY;  Service: Cardiovascular;  Laterality: N/A;   CARDIOVERSION N/A 01/27/2022   Procedure: CARDIOVERSION;  Surgeon: Meriam Sprague, MD;  Location: [facility] ENDOSCOPY;  Service: Cardiovascular;  Laterality: N/A;   CATARACT EXTRACTION Bilateral 10/14/2010   both eyes   COLONOSCOPY  02/2011   [facility] SURGERY Bilateral    x 2 - frozen shoulder rt 2006, 2000 left    TEE WITHOUT CARDIOVERSION N/A 01/03/2015   Procedure: TRANSESOPHAGEAL ECHOCARDIOGRAM (TEE);  Surgeon: Pricilla Riffle, MD;  Location: [facility] ENDOSCOPY;  Service: Cardiovascular;  Laterality: N/A;   TONSILLECTOMY       Social History:  The patient  reports that she quit smoking about 37 years ago. Her smoking use included cigarettes. She has a 20.00 pack-year smoking history. She has never used smokeless tobacco. She reports that she does not drink alcohol and does not use drugs.   Family History:  The patient's family history includes Alcohol abuse in her father; Bladder Cancer in her mother; Breast cancer in her maternal grandmother and mother; Uterine cancer in her sister.    ROS:  Please see the history of present illness. All other systems are reviewed and  Negative to the above problem except as noted.    PHYSICAL EXAM: VS:  BP 110/80 (BP Location: Left Arm, Patient Position: Sitting, Cuff Size: Normal)   Pulse 96   Ht 5\' 5"  (1.651 m)   Wt 176 lb (79.8 kg)   SpO2 96%   BMI 29.29 kg/m   GEN: Well nourished, well developed, in no acute distress  HEENT: normal  Neck: JVP is normal   No carotid bruits Cardiac: Irreg irreg  ; no murmurs   No LE  edema  Respiratory:  clear to auscultation  GI: soft, nontender, nondistended, + BS  No hepatomegaly  MS: no deformity Moving all extremities   Skin: warm and dry, no rash Neuro:   Strength and sensation are intact Psych: euthymic mood, full affect   EKG:  EKG is ordered today.     Atrial fibrllation   88 bpm   Testing     The following studies were reviewed today: Stress test 11/2021 The study is normal. The study is low risk.   No ST deviation was noted.  Left ventricular function is normal. Nuclear stress EF: 52 %. The left ventricular ejection fraction is mildly decreased (45-54%). End diastolic cavity size is normal.   Prior study not available for comparison.   Monitor 02/2021 Patch Wear Time:  7 days and 14 hours (2022-08-20T10:39:16-0400 to 2022-08-28T00:54:20-0400)   Predominant rhythm:  Sinus rhythm   Overall heart rates 52 to 174 bpm   Average HR 67 bpm   Intermttent SVT, longest lasting 21.4 sec (130 bpm)   Rare PVCs,   Occasional PVCs     No diary entries/triggered events      Echo 05/2020 1. Left ventricular ejection fraction, by estimation, is 60 to 65%. The  left ventricle has normal function. Left ventricular diastolic function  could not be evaluated.   2. Right ventricular systolic function is normal. The right ventricular  size is normal. There is normal pulmonary artery systolic pressure.   3. The mitral valve is grossly normal. Mild mitral valve regurgitation.   4. The aortic valve is normal in structure. Aortic valve regurgitation is  not visualized. No aortic stenosis is present.  Lipid Panel    Component Value Date/Time   CHOL 276 (H) 02/04/2021 1147   CHOL 223 (H) 07/29/2020 1208   TRIG 164.0 (H) 02/04/2021 1147   HDL 61.80 02/04/2021 1147   HDL 58 07/29/2020 1208   CHOLHDL 4 02/04/2021 1147   VLDL 32.8 02/04/2021 1147   LDLCALC 181 (H) 02/04/2021 1147   LDLCALC 133 (H) 07/29/2020 1208   LDLCALC 170 (H) 01/23/2020 1022   LDLDIRECT 129.0 11/28/2018 1048      Wt Readings from Last 3 Encounters:  03/04/22 176 lb (79.8 kg)  01/27/22 170 lb (77.1 kg)  01/13/22 178 lb 6.4 oz (80.9 kg)      ASSESSMENT AND PLAN:  1   Atrial fibrillation   Pt underwent cardioversion in Aug  to SR   Felt she was back in afib about 1 wk later    EKG shows it      REcomm:  Keep on Eliquis   Rates sound good   She checks fairly often      2  CAD   On CT scan   PT asymptoamtic    Follow   Risk factor modify  3  HL    LDL 168 in Aug   Pt intolerant to statins   WIll look at coverage for evolucomab vs inclisiran   Told patient we would contact her     4  HTN  BP is good  5   DIet   Minimize carbs , sugars, processed foods    Follow up  in March 2024  Signed, Dorris Carnes, MD  03/04/2022 9:48 AM    [address], [place], [place]  [address] Phone: [phone]; Fax: [phone]

## 2022-03-04 ENCOUNTER — Ambulatory Visit: Payer: Medicare Other | Attending: Internal Medicine | Admitting: Internal Medicine

## 2022-03-04 ENCOUNTER — Encounter: Payer: Self-pay | Admitting: Internal Medicine

## 2022-03-04 VITALS — BP 110/80 | HR 96 | Ht 65.0 in | Wt 176.0 lb

## 2022-03-04 DIAGNOSIS — E785 Hyperlipidemia, unspecified: Secondary | ICD-10-CM | POA: Diagnosis not present

## 2022-03-04 DIAGNOSIS — I480 Paroxysmal atrial fibrillation: Secondary | ICD-10-CM

## 2022-03-04 NOTE — Patient Instructions (Signed)
Medication Instructions:   *If you need a refill on your cardiac medications before your next appointment, please call your pharmacy*   Lab Work:  If you have labs (blood work) drawn today and your tests are completely normal, you will receive your results only by: MyChart Message (if you have MyChart) OR A paper copy in the mail If you have any lab test that is abnormal or we need to change your treatment, we will call you to review the results.   Testing/Procedures:    Follow-Up: At [facility], you and your health needs are our priority.  As part of our continuing mission to provide you with exceptional heart care, we have created designated Provider Care Teams.  These Care Teams include your primary Cardiologist (physician) and Advanced Practice Providers (APPs -  Physician Assistants and Nurse Practitioners) who all work together to provide you with the care you need, when you need it.  We recommend signing up for the patient portal called "MyChart".  Sign up information is provided on this After Visit Summary.  MyChart is used to connect with patients for Virtual Visits (Telemedicine).  Patients are able to view lab/test results, encounter notes, upcoming appointments, etc.  Non-urgent messages can be sent to your provider as well.   To learn more about what you can do with MyChart, go to [email or web address].    Your next appointment:   6 month(s)  The format for your next appointment:   In Person  Provider:   Paula Ross, MD     Other Instructions   Important Information About Sugar       

## 2022-03-10 ENCOUNTER — Other Ambulatory Visit: Payer: Self-pay | Admitting: Internal Medicine

## 2022-03-15 ENCOUNTER — Other Ambulatory Visit: Payer: Self-pay | Admitting: Internal Medicine

## 2022-04-07 ENCOUNTER — Telehealth: Payer: Self-pay | Admitting: Pulmonary Disease

## 2022-04-07 ENCOUNTER — Ambulatory Visit: Payer: Medicare Other | Admitting: Pulmonary Disease

## 2022-04-07 ENCOUNTER — Encounter: Payer: Self-pay | Admitting: Pulmonary Disease

## 2022-04-07 VITALS — BP 122/74 | HR 73 | Temp 97.8°F | Ht 65.0 in | Wt 178.6 lb

## 2022-04-07 DIAGNOSIS — J4540 Moderate persistent asthma, uncomplicated: Secondary | ICD-10-CM

## 2022-04-07 MED ORDER — FLUTICASONE FUROATE-VILANTEROL 100-25 MCG/ACT IN AEPB: 1.0000 | INHALATION_SPRAY | Freq: Every day | RESPIRATORY_TRACT | 5 refills | Status: DC

## 2022-04-07 NOTE — Patient Instructions (Signed)
Breo one puff daily, and rinse your mouth after each use.  Stop using flovent once you start using breo.  Follow up in 6 months.

## 2022-04-07 NOTE — Progress Notes (Signed)
[facility]  Chief Complaint  Patient presents with   Follow-up    Asthma.  Doing well.  Still c/o SOB with exertion.    Past Surgical History:  She  has a past surgical history that includes Cataract extraction (Bilateral, 10/14/2010); Shoulder surgery (Bilateral); TEE without cardioversion (N/A, 01/03/2015); Cardioversion (N/A, 01/03/2015); Tonsillectomy; Colonoscopy (02/2011); Cardioversion (N/A, 08/06/2020); and Cardioversion (N/A, 01/27/2022).  Past Medical History:  COVID 03 May 2020 and January 2023, Atrial fibrillation, Allergies, Anemia, Cataract, GERD, HLD, HTN, Nephrolithiasis  Constitutional:  BP 122/74 (BP Location: Left Arm, Patient Position: Sitting, Cuff Size: Normal)   Pulse 73   Temp 97.8 F (36.6 C) (Oral)   Ht 5\' 5"  (1.651 m)   Wt 178 lb 9.6 oz (81 kg)   SpO2 96%   BMI 29.72 kg/m   Brief Summary:  Penny Barnett is a 71 y.o. female former smoker with asthma.      Subjective:   She using albuterol and ipratropium at least twice per day.  She also uses albuterol during the day when at work.  Using albuterol helps her breathing.  She is not having sinus congestion, post nasal drip, sore throat, wheeze, or sputum.  Sleeping okay.  She does not plan to get the flu shot.  Physical Exam:   Appearance - well kempt   ENMT - no sinus tenderness, no oral exudate, no LAN, Mallampati 3 airway, no stridor  Respiratory - equal breath sounds bilaterally, no wheezing or rales  CV - s1s2 regular rate and rhythm, no murmurs  Ext - no clubbing, no edema  Skin - no rashes  Psych - normal mood and affect    Pulmonary testing:  PFT 02/11/17 >> FEV1 2.11 (87%), FEV1% 82, TLC 5.27 (101%), DLCO 86% PFT 07/29/21 >> FEV1 1.87 (81%), FEV1% 83, TLC 4.43 (85%), DLCO 90% FeNO 08/04/21 >> 27  Chest Imaging:  CT angio chest 12/30/14 >> mild mosaic pattern Cardiac CT 09/24/21 >> mild emphysema  Cardiac Tests:  Echo 01/07/22  >> EF 60 to 65%, mild LVH, RVSP 40.2 mmHg, severe LA dilation, mild MR, mod TR  Social History:  She  reports that she quit smoking about 37 years ago. Her smoking use included cigarettes. She has a 20.00 pack-year smoking history. She has never used smokeless tobacco. She reports that she does not drink alcohol and does not use drugs.  Family History:  Her family history includes Alcohol abuse in her father; Bladder Cancer in her mother; Breast cancer in her maternal grandmother and mother; Uterine cancer in her sister.     Assessment/Plan:   Allergic asthma and elevated FeNO. - previously on allergy shots in 1990's - allergy to cats - has progressive symptoms with more frequent rescue inhaler use - will change from flovent to breo 100 one puff daily - continue singulair 10 mg qhs - prn albuterol, ipratropium  Atrial fibrillation. - developed in setting of COVID 19 infection - followed by Dr. Dietrich Pates with [facility]  Time Spent Involved in Patient Care on Day of Examination:  36 minutes  Follow up:   Patient Instructions  Breo one puff daily, and rinse your mouth after each use.  Stop using flovent once you start using breo.  Follow up in 6 months.  Medication List:   Allergies as of 04/07/2022       Reactions   Statins    Joint and muscle pain   Penicillins Other (See  Comments)   From childhood- reaction not recalled        Medication List        Accurate as of April 07, 2022 10:19 AM. If you have any questions, ask your nurse or doctor.          STOP taking these medications    Flovent HFA 110 MCG/ACT inhaler Generic drug: fluticasone Stopped by: Chesley Mires, MD       TAKE these medications    acetaminophen 325 MG tablet Commonly known as: TYLENOL Take 325-650 mg by mouth at bedtime as needed for moderate pain.   albuterol 108 (90 Base) MCG/ACT inhaler Commonly known as: VENTOLIN HFA USE 2 INHALATIONS BY MOUTH EVERY 6 HOURS AS  NEEDED FOR WHEEZING  OR SHORTNESS OF BREATH   apixaban 5 MG Tabs tablet Commonly known as: ELIQUIS Take 1 tablet (5 mg total) by mouth 2 (two) times daily.   Atrovent HFA 17 MCG/ACT inhaler Generic drug: ipratropium Inhale 2 puffs into the lungs in the morning and at bedtime.   bisoprolol 5 MG tablet Commonly known as: ZEBETA TAKE 1 TABLET BY MOUTH DAILY   diclofenac Sodium 1 % Gel Commonly known as: VOLTAREN Apply 2 g topically daily as needed (Pain).   diphenhydramine-acetaminophen 25-500 MG Tabs tablet Commonly known as: TYLENOL PM Take 1 tablet by mouth at bedtime as needed (sleep).   fluticasone furoate-vilanterol 100-25 MCG/ACT Aepb Commonly known as: Breo Ellipta Inhale 1 puff into the lungs daily. Started by: Chesley Mires, MD   LUTEIN-ZEAXANTHIN PO Take 1 tablet by mouth daily.   montelukast 10 MG tablet Commonly known as: SINGULAIR TAKE 1 TABLET BY MOUTH EVERY DAY IN THE MORNING   MOVE FREE ULTRA JOINT HEALTH PO Take 1 tablet by mouth daily.   MULTIVITAMIN/IRON PO Take 1 tablet by mouth daily.   omeprazole 20 MG capsule Commonly known as: PRILOSEC Take 20 mg by mouth daily.   Pitavastatin Calcium 2 MG Tabs Take 1 tablet (2 mg total) by mouth daily. Please keep upcoming appt with Dr. Harrington Challenger in September 2023 before anymore refills. Thank you Final attempt   potassium chloride SA 20 MEQ tablet Commonly known as: [place]-Con M20 Take one tablet every other day alternating with two tablets on the opposite days.   triamterene-hydrochlorothiazide 37.5-25 MG tablet Commonly known as: MAXZIDE-25 TAKE 1/2 TABLET BY MOUTH DAILY   VISINE OP Place 1 drop into both eyes daily as needed (redness).   vitamin B-12 500 MCG tablet Commonly known as: CYANOCOBALAMIN Take 500 mcg by mouth daily.   Vitamin D-3 125 MCG (5000 UT) Tabs Take 5,000 Units by mouth daily.        Signature:  Chesley Mires, MD [place] Pager - [phone] 04/07/2022, 10:19 AM

## 2022-04-08 MED ORDER — FLUTICASONE FUROATE-VILANTEROL 100-25 MCG/ACT IN AEPB: 1.0000 | INHALATION_SPRAY | Freq: Every day | RESPIRATORY_TRACT | 5 refills | Status: DC

## 2022-04-08 NOTE — Telephone Encounter (Signed)
Updated RX has been sent to pharmacy.   Nothing further needed.

## 2022-04-12 NOTE — Progress Notes (Unsigned)
Patient ID: Penny Barnett                 DOB: Nov 05, 1950                    MRN: [id number]      HPI: Penny Barnett is a 71 y.o. female patient referred to lipid clinic by Dr. Tenny Craw . PMH is significant for atrial fibriliation, hyperlipidemia, hypertension,asthma.  Patient LDLc 168 mg/dl (32/20/2542) patient is intolerant to statins. CAC score is 391 (percentile 90)  No acute concern reported today. Patient had tried atorvastatin and rosuvastatin in the past, they were causing her intolerable joint and muscle pain. She does not recall the strengths for those statins. She had even tried to take half pill and every other day but she was not able to tolerated either of them.The last statin she had tried was pitavastain 2 mg which did not bother her initially but few weeks later she started having same joint/ muscle pain so she had to stopped taking it. [place], and takes her dog for walk everyday for 20-30 min. Patient is motivated to start regular exercise regimen and work her way up.   Current Medications: none Intolerances: pitavastatin 2 mg rosuvastatin 5 mg  Risk Factors: age, hypertension, Afib, cardiomyopathy  LDL goal: <70 mg/dl   Diet:  Generally eats healthy small portion, does not eat bread and other starchy vegetables  -Breakfast - Yogurt  -Lunch - sandwiches ([place], lean beef) chicken breast,  -Dinner Chicken breast butternut squash, broccoli Eats out 2-4 times per month makes healthy choices   Exercise: started part [place] so does not have lot of time for exercise, use to go to [employer]. Patient is motivated to start implementing regular exercise schedule.  Family History: mother- breast cancer, bladder cnacer, heart valve replaced (96 and still alive) Sister - uterine cancer, father- alcohol poisoning   Social History:  Alcohol :none Tobacco: former smoker (quit 1986)  Labs: Lipid Panel      Component Value Date/Time   CHOL 276 (H) 02/04/2021 1147   CHOL 223 (H) 07/29/2020 1208   TRIG 164.0 (H) 02/04/2021 1147   HDL 61.80 02/04/2021 1147   HDL 58 07/29/2020 1208   CHOLHDL 4 02/04/2021 1147   VLDL 32.8 02/04/2021 1147   LDLCALC 181 (H) 02/04/2021 1147   LDLCALC 133 (H) 07/29/2020 1208   LDLCALC 170 (H) 01/23/2020 1022   LDLDIRECT 129.0 11/28/2018 1048   LABVLDL 32 07/29/2020 1208    Past Medical History:  Diagnosis Date   Allergy    Anemia    Arthritis    hands   Asthma    as a child and then attack 1996  poss allergic to cat hair. ventilated x 2 . saw Dr [place] Callas then .    Atrial flutter (HCC)    a. 12/2014 Echo: EF 60-65%, no rwma, mild MR;  b. S/P TEE/DCCV;  c. CHA2DS2VASc = 3-->Eliquis.   Cataract    Had surgery to remove   GERD (gastroesophageal reflux disease)    2000 taking otc prevacid   evey day.    Hyperlipidemia    Hypertension     3 meds controlled   Kidney stone    passed stone - no surgery required    Current Outpatient Medications on File Prior to Visit  Medication Sig Dispense Refill   acetaminophen (TYLENOL) 325 MG tablet Take 325-650  mg by mouth at bedtime as needed for moderate pain.     albuterol (VENTOLIN HFA) 108 (90 Base) MCG/ACT inhaler USE 2 INHALATIONS BY MOUTH EVERY 6 HOURS AS NEEDED FOR WHEEZING  OR SHORTNESS OF BREATH 34 g 2   apixaban (ELIQUIS) 5 MG TABS tablet Take 1 tablet (5 mg total) by mouth 2 (two) times daily. 180 tablet 3   bisoprolol (ZEBETA) 5 MG tablet TAKE 1 TABLET BY MOUTH DAILY 90 tablet 3   Cholecalciferol (VITAMIN D-3) 125 MCG (5000 UT) TABS Take 5,000 Units by mouth daily.     Collagen-Boron-Hyaluronic Acid (MOVE FREE ULTRA JOINT HEALTH PO) Take 1 tablet by mouth daily.     diclofenac Sodium (VOLTAREN) 1 % GEL Apply 2 g topically daily as needed (Pain).     diphenhydramine-acetaminophen (TYLENOL PM) 25-500 MG TABS tablet Take 1 tablet by mouth at bedtime as needed (sleep).     fluticasone furoate-vilanterol (BREO  ELLIPTA) 100-25 MCG/ACT AEPB Inhale 1 puff into the lungs daily. 60 each 5   ipratropium (ATROVENT HFA) 17 MCG/ACT inhaler Inhale 2 puffs into the lungs in the morning and at bedtime. 12.9 each 5   montelukast (SINGULAIR) 10 MG tablet TAKE 1 TABLET BY MOUTH EVERY DAY IN THE MORNING 90 tablet 2   Multiple Vitamins-Iron (MULTIVITAMIN/IRON PO) Take 1 tablet by mouth daily.     Multiple Vitamins-Minerals (LUTEIN-ZEAXANTHIN PO) Take 1 tablet by mouth daily.     omeprazole (PRILOSEC) 20 MG capsule Take 20 mg by mouth daily.     Pitavastatin Calcium 2 MG TABS Take 1 tablet (2 mg total) by mouth daily. Please keep upcoming appt with Dr. Harrington Challenger in September 2023 before anymore refills. Thank you Final attempt 30 tablet 2   potassium chloride SA (KLOR-CON M20) 20 MEQ tablet Take one tablet every other day alternating with two tablets on the opposite days. 90 tablet 2   Tetrahydrozoline HCl (VISINE OP) Place 1 drop into both eyes daily as needed (redness).     triamterene-hydrochlorothiazide (MAXZIDE-25) 37.5-25 MG tablet TAKE 1/2 TABLET BY MOUTH DAILY 45 tablet 3   vitamin B-12 (CYANOCOBALAMIN) 500 MCG tablet Take 500 mcg by mouth daily.     No current facility-administered medications on file prior to visit.    Allergies  Allergen Reactions   Statins     Joint and muscle pain   Penicillins Other (See Comments)    From childhood- reaction not recalled     Hyperlipidemia Assessment: Goal LDLc 70mg /dl last LDLc 168 mg/dl (01/20/2022)  Patient is intolerant to statins; tried atorvastatin, rosuvastatin and pitavastatin gets joints/ muscular pain Patient reports trying to taking all these statins even half dose and every other day but still would experience the pain  Pain improves when she gets off of them  Given the risk factors (age, hypertension, Afib, cardiomyopathy,CAD on CT scan ) reasonable to try [id number] to lower the LDLc  After discussing risk and benefits of evalocumab Patient is in agreement  to try injectable lipid lowering agent Plan:  Will apply for Repatha PA and inform patient upon approval Upon starting therapy pharmacist to follow up on tolerability via MyChart  Patient to go for lipid lab 12 weeks after starting new therapy   Thank you,  Cammy Copa, Pharm.D [facility] A Division of [facility] [address] [place], [place] [address]  Phone: [phone]; Fax: [phone]

## 2022-04-13 ENCOUNTER — Ambulatory Visit: Payer: Medicare Other | Attending: Cardiovascular Disease | Admitting: Student

## 2022-04-13 ENCOUNTER — Telehealth: Payer: Self-pay | Admitting: Pharmacist

## 2022-04-13 ENCOUNTER — Encounter: Payer: Self-pay | Admitting: Student

## 2022-04-13 ENCOUNTER — Other Ambulatory Visit: Payer: Self-pay | Admitting: Internal Medicine

## 2022-04-13 DIAGNOSIS — E785 Hyperlipidemia, unspecified: Secondary | ICD-10-CM | POA: Diagnosis not present

## 2022-04-13 MED ORDER — REPATHA 140 MG/ML ~~LOC~~ SOSY: 140.0000 mg | PREFILLED_SYRINGE | SUBCUTANEOUS | 11 refills | Status: DC

## 2022-04-13 NOTE — Telephone Encounter (Signed)
Repatha PA submitted on Apr 13, 2022.  (Key: [id number]) Approved  PA exp: 10/13/2022

## 2022-04-13 NOTE — Patient Instructions (Addendum)
I will submit a prior authorization for Repatha. I will call you once I hear back.Please call me at [phone] with any questions.   Repatha is a cholesterol medication that improved your body's ability to get rid of "bad cholesterol" known as LDL. It can lower your LDL up to 60%! It is an injection that is given under the skin every 2 weeks. The medication often requires a prior authorization from your insurance company. We will take care of submitting all the necessary information to your insurance company to get it approved. The most common side effects of Repatha include runny nose, symptoms of the common cold, rarely flu or flu-like symptoms, back/muscle pain in about 3-4% of the patients, and redness, pain, or bruising at the injection site. Tell your healthcare provider if you have any side effect that bothers you or that does not go away.   Cammy Copa, Pharm.D [facility] A Division of [facility] [address] [place], [place] [address]  Phone: [phone]; Fax: [phone]

## 2022-04-13 NOTE — Assessment & Plan Note (Addendum)
Assessment:  Goal LDLc 70mg /dl last LDLc 168 mg/dl (01/20/2022)   Patient is intolerant to statins; tried atorvastatin, rosuvastatin and pitavastatin gets joints/ muscular pain  Patient reports trying to taking all these statins even half dose and every other day but still would experience the pain   Pain improves when she gets off of them   Given the risk factors (age, hypertension, Afib, cardiomyopathy,CAD on CT scan ) reasonable to try PCSK9i to lower the LDLc   After discussing risk and benefits of evalocumab Patient is in agreement to try injectable lipid lowering agent Plan:   Will apply for Repatha PA and inform patient upon approval  Upon starting therapy pharmacist to follow up on tolerability via MyChart   Patient to go for lipid lab 12 weeks after starting new therapy

## 2022-05-06 ENCOUNTER — Other Ambulatory Visit: Payer: Self-pay | Admitting: Internal Medicine

## 2022-05-07 ENCOUNTER — Other Ambulatory Visit: Payer: Self-pay | Admitting: Internal Medicine

## 2022-05-19 ENCOUNTER — Encounter: Payer: Medicare Other | Admitting: Internal Medicine

## 2022-05-27 ENCOUNTER — Other Ambulatory Visit: Payer: Self-pay | Admitting: Internal Medicine

## 2022-06-03 ENCOUNTER — Encounter: Payer: Medicare Other | Admitting: Internal Medicine

## 2022-06-14 ENCOUNTER — Encounter: Payer: Self-pay | Admitting: Internal Medicine

## 2022-07-06 ENCOUNTER — Encounter: Payer: Self-pay | Admitting: Internal Medicine

## 2022-07-06 MED ORDER — APIXABAN 5 MG PO TABS: 5.0000 mg | ORAL_TABLET | Freq: Two times a day (BID) | ORAL | 3 refills | Status: DC

## 2022-07-06 MED ORDER — BISOPROLOL FUMARATE 5 MG PO TABS: 5.0000 mg | ORAL_TABLET | Freq: Every day | ORAL | 3 refills | Status: DC

## 2022-07-22 ENCOUNTER — Encounter: Payer: Self-pay | Admitting: Internal Medicine

## 2022-07-22 ENCOUNTER — Ambulatory Visit: Payer: Medicare HMO | Admitting: Internal Medicine

## 2022-07-22 VITALS — BP 120/88 | HR 90 | Temp 97.4°F | Ht 63.75 in | Wt 176.7 lb

## 2022-07-22 DIAGNOSIS — Z7901 Long term (current) use of anticoagulants: Secondary | ICD-10-CM | POA: Diagnosis not present

## 2022-07-22 DIAGNOSIS — Z8616 Personal history of COVID-19: Secondary | ICD-10-CM | POA: Diagnosis not present

## 2022-07-22 DIAGNOSIS — I4819 Other persistent atrial fibrillation: Secondary | ICD-10-CM | POA: Diagnosis not present

## 2022-07-22 DIAGNOSIS — Z0000 Encounter for general adult medical examination without abnormal findings: Secondary | ICD-10-CM | POA: Diagnosis not present

## 2022-07-22 DIAGNOSIS — Z87898 Personal history of other specified conditions: Secondary | ICD-10-CM

## 2022-07-22 DIAGNOSIS — R7301 Impaired fasting glucose: Secondary | ICD-10-CM

## 2022-07-22 DIAGNOSIS — E785 Hyperlipidemia, unspecified: Secondary | ICD-10-CM

## 2022-07-22 DIAGNOSIS — Z79899 Other long term (current) drug therapy: Secondary | ICD-10-CM

## 2022-07-22 LAB — COMPREHENSIVE METABOLIC PANEL
ALT: 22 U/L (ref 0–35)
AST: 19 U/L (ref 0–37)
Albumin: 4.8 g/dL (ref 3.5–5.2)
Alkaline Phosphatase: 85 U/L (ref 39–117)
BUN: 19 mg/dL (ref 6–23)
CO2: 25 mEq/L (ref 19–32)
Calcium: 9.7 mg/dL (ref 8.4–10.5)
Chloride: 100 mEq/L (ref 96–112)
Creatinine, Ser: 0.63 mg/dL (ref 0.40–1.20)
GFR: 87.88 mL/min (ref >60.00)
Glucose, Bld: 103 mg/dL — ABNORMAL HIGH (ref 70–99)
Potassium: 3.6 mEq/L (ref 3.5–5.1)
Sodium: 135 mEq/L (ref 135–145)
Total Bilirubin: 0.6 mg/dL (ref 0.2–1.2)
Total Protein: 8 g/dL (ref 6.0–8.3)

## 2022-07-22 LAB — LIPID PANEL
Cholesterol: 177 mg/dL (ref 0–200)
HDL: 59.8 mg/dL (ref >39.00)
LDL Cholesterol: 89 mg/dL (ref 0–99)
NonHDL: 117.13
Total CHOL/HDL Ratio: 3
Triglycerides: 143 mg/dL (ref 0.0–149.0)
VLDL: 28.6 mg/dL (ref 0.0–40.0)

## 2022-07-22 LAB — CBC WITH DIFFERENTIAL/PLATELET
Basophils Absolute: 0 10*3/uL (ref 0.0–0.1)
Basophils Relative: 0.4 % (ref 0.0–3.0)
Eosinophils Absolute: 0 10*3/uL (ref 0.0–0.7)
Eosinophils Relative: 0.5 % (ref 0.0–5.0)
HCT: 40.2 % (ref 36.0–46.0)
Hemoglobin: 13.5 g/dL (ref 12.0–15.0)
Lymphocytes Relative: 15.7 % (ref 12.0–46.0)
Lymphs Abs: 0.9 10*3/uL (ref 0.7–4.0)
MCHC: 33.7 g/dL (ref 30.0–36.0)
MCV: 84.3 fl (ref 78.0–100.0)
Monocytes Absolute: 0.4 10*3/uL (ref 0.1–1.0)
Monocytes Relative: 7.7 % (ref 3.0–12.0)
Neutro Abs: 4.2 10*3/uL (ref 1.4–7.7)
Neutrophils Relative %: 75.7 % (ref 43.0–77.0)
Platelets: 217 10*3/uL (ref 150.0–400.0)
RBC: 4.77 Mil/uL (ref 3.87–5.11)
RDW: 15.3 % (ref 11.5–15.5)
WBC: 5.5 10*3/uL (ref 4.0–10.5)

## 2022-07-22 LAB — TSH: TSH: 0.94 u[IU]/mL (ref 0.35–5.50)

## 2022-07-22 LAB — HEMOGLOBIN A1C: Hgb A1c MFr Bld: 5.8 % (ref 4.6–6.5)

## 2022-07-22 NOTE — Progress Notes (Signed)
Chief Complaint  Patient presents with   Annual Exam    HPI: Patient  Penny Barnett  72 y.o. comes in today for Preventive Health Care visit .   Bp hx afib    Repatha :followed by Dr Ricarda Frame  and cards  CV last August .  Reverted .  Denies sig sx  at this time. No syncope cp sob  Pulm:   Singulair  at night   Dr Craige Cotta   rx breo  since October  has helped .  Dyspnea and better .  Allergy to cats?  Has pet dog.   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    Health Maintenance  Topic Date Due   Medicare Annual Wellness (AWV)  08/28/2022   COVID-19 Vaccine (1) 08/07/2022 (Originally 12/06/1955)   INFLUENZA VACCINE  09/13/2022 (Originally 01/13/2022)   Zoster Vaccines- Shingrix (1 of 2) 10/20/2022 (Originally 12/07/1969)   MAMMOGRAM  01/01/2023   COLONOSCOPY (Pts 45-58yrs Insurance coverage will need to be confirmed)  04/27/2026   Pneumonia Vaccine 58+ Years old  Completed   DEXA SCAN  Completed   Hepatitis C Screening  Completed   HPV VACCINES  Aged Out   DTaP/Tdap/Td  Discontinued   Health Maintenance Review LIFESTYLE:  Exercise:  enterprise work and walking .  Tobacco/ETS: no Alcohol:  no Sugar beverages: Sleep: 8-9 or 5  Drug use: no HH of  1  and dog Work  Research scientist (life sciences) acution   ROS:  GEN/ HEENT: No fever, significant weight changes sweats headaches vision problems hearing changes,  had episode of  light headednesa and then tru vertigo after shower and laid in bed on side and was gone when awoke  lasted 2 hours  no vision  ms sx weakness had vomiting with the dizziness  no headache.  No cv sx .  CV/ PULM; No chest pain shortness of breath cough, syncope,edema  change in exercise tolerance. GI /GU: No adominal pain, vomiting, change in bowel habits. No blood in the stool. No significant GU symptoms. SKIN/HEME: ,no acute skin rashes suspicious lesions or bleeding. No lymphadenopathy, nodules, masses.  NEURO/ PSYCH:  No neurologic signs such as  weakness numbness. No depression anxiety. IMM/ Allergy: No unusual infections.  Allergy .   REST of 12 system review negative except as per HPI   Past Medical History:  Diagnosis Date   Allergy    Anemia    Arthritis    hands   Asthma    as a child and then attack 1996  poss allergic to cat hair. ventilated x 2 . saw Dr Casselman Callas then .    Atrial flutter (HCC)    a. 12/2014 Echo: EF 60-65%, no rwma, mild MR;  b. S/P TEE/DCCV;  c. CHA2DS2VASc = 3-->Eliquis.   Cataract    Had surgery to remove   GERD (gastroesophageal reflux disease)    2000 taking otc prevacid   evey day.    Hyperlipidemia    Hypertension     3 meds controlled   Kidney stone    passed stone - no surgery required    Past Surgical History:  Procedure Laterality Date   CARDIOVERSION N/A 01/03/2015   Procedure: CARDIOVERSION;  Surgeon: Pricilla Riffle, MD;  Location: [facility] ENDOSCOPY;  Service: Cardiovascular;  Laterality: N/A;   CARDIOVERSION N/A 08/06/2020   Procedure: CARDIOVERSION;  Surgeon: [name], MD;  Location: [facility] ENDOSCOPY;  Service: Cardiovascular;  Laterality: N/A;  CARDIOVERSION N/A 01/27/2022   Procedure: CARDIOVERSION;  Surgeon: Freada Bergeron, MD;  Location: [facility] ENDOSCOPY;  Service: Cardiovascular;  Laterality: N/A;   CATARACT EXTRACTION Bilateral 10/14/2010   both eyes   COLONOSCOPY  02/2011   [facility] SURGERY Bilateral    x 2 - frozen shoulder rt 2006, 2000 left    TEE WITHOUT CARDIOVERSION N/A 01/03/2015   Procedure: TRANSESOPHAGEAL ECHOCARDIOGRAM (TEE);  Surgeon: Fay Records, MD;  Location: [facility] ENDOSCOPY;  Service: Cardiovascular;  Laterality: N/A;   TONSILLECTOMY      Family History  Problem Relation Age of Onset   Breast cancer Mother    Bladder Cancer Mother    Uterine cancer Sister    Alcohol abuse Father    Breast cancer Maternal Grandmother    Colon cancer Neg Hx    Heart attack Neg Hx    Stroke Neg Hx    Hypertension Neg Hx    Esophageal cancer Neg Hx    Rectal  cancer Neg Hx    Stomach cancer Neg Hx     Social History   Socioeconomic History   Marital status: Single    Spouse name: Not on file   Number of children: Not on file   Years of education: Not on file   Highest education level: Not on file  Occupational History   Occupation: CSR  Tobacco Use   Smoking status: Former    Packs/day: 1.00    Years: 20.00    Total pack years: 20.00    Types: Cigarettes    Quit date: 06/21/1984    Years since quitting: 38.1   Smokeless tobacco: Never  Vaping Use   Vaping Use: Never used  Substance and Sexual Activity   Alcohol use: No    Alcohol/week: 0.0 standard drinks of alcohol   Drug use: No   Sexual activity: Not on file  Other Topics Concern   Not on file  Social History Narrative   [place]      In [place] years  Has family here   Works 40 per week [employer]    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    HS education  single   6 hours of sleep   Hh  Of 1  Pet dog and a horse.    Remote tobacco  1-2 ppd stopped 1988    Etoh couple times a month rare    ocass  Diet soda.             Social Determinants of Health   Financial Resource Strain: Low Risk  (08/27/2021)   Overall Financial Resource Strain (CARDIA)    Difficulty of Paying Living Expenses: Not hard at all  Food Insecurity: No Food Insecurity (08/27/2021)   Hunger Vital Sign    Worried About Running Out of Food in the Last Year: Never true    Ran Out of Food in the Last Year: Never true  Transportation Needs: No Transportation Needs (08/27/2021)   PRAPARE - Hydrologist (Medical): No    Lack of Transportation (Non-Medical): No  Physical Activity: Sufficiently Active (08/27/2021)   Exercise Vital Sign    Days of Exercise per Week: 5 days    Minutes of Exercise per Session: 60 min  Stress: No Stress Concern Present (08/27/2021)   [place]    Feeling of  Stress : Not  at all  Social Connections: Socially Isolated (08/27/2021)   Social Connection and Isolation Panel [NHANES]    Frequency of Communication with Friends and Family: More than three times a week    Frequency of Social Gatherings with Friends and Family: More than three times a week    Attends Religious Services: Never    Marine scientist or Organizations: No    Attends Archivist Meetings: Never    Marital Status: Never married    Outpatient Medications Prior to Visit  Medication Sig Dispense Refill   acetaminophen (TYLENOL) 325 MG tablet Take 325-650 mg by mouth at bedtime as needed for moderate pain.     apixaban (ELIQUIS) 5 MG TABS tablet Take 1 tablet (5 mg total) by mouth 2 (two) times daily. 180 tablet 3   bisoprolol (ZEBETA) 5 MG tablet Take 1 tablet (5 mg total) by mouth daily. 90 tablet 3   Cholecalciferol (VITAMIN D-3) 125 MCG (5000 UT) TABS Take 5,000 Units by mouth daily.     Collagen-Boron-Hyaluronic Acid (MOVE FREE ULTRA JOINT HEALTH PO) Take 1 tablet by mouth daily.     diclofenac Sodium (VOLTAREN) 1 % GEL Apply 2 g topically daily as needed (Pain).     diphenhydramine-acetaminophen (TYLENOL PM) 25-500 MG TABS tablet Take 1 tablet by mouth at bedtime as needed (sleep).     Evolocumab (REPATHA SURECLICK) 536 MG/ML SOAJ Inject 140 mg into the skin every 14 (fourteen) days. 2 mL 11   fluticasone furoate-vilanterol (BREO ELLIPTA) 100-25 MCG/ACT AEPB Inhale 1 puff into the lungs daily. 60 each 5   montelukast (SINGULAIR) 10 MG tablet TAKE 1 TABLET BY MOUTH DAILY IN  THE MORNING 100 tablet 2   Multiple Vitamins-Iron (MULTIVITAMIN/IRON PO) Take 1 tablet by mouth daily.     Multiple Vitamins-Minerals (LUTEIN-ZEAXANTHIN PO) Take 1 tablet by mouth daily.     omeprazole (PRILOSEC) 20 MG capsule Take 20 mg by mouth daily.     Pitavastatin Calcium 2 MG TABS Take 1 tablet (2 mg total) by mouth daily. Please keep upcoming appt with Dr. Harrington Challenger in September 2023 before  anymore refills. Thank you Final attempt 30 tablet 2   potassium chloride SA (KLOR-CON M20) 20 MEQ tablet Take one tablet every other day alternating with two tablets on the opposite days. 90 tablet 2   Tetrahydrozoline HCl (VISINE OP) Place 1 drop into both eyes daily as needed (redness).     triamterene-hydrochlorothiazide (MAXZIDE-25) 37.5-25 MG tablet TAKE ONE-HALF TABLET BY MOUTH  DAILY 45 tablet 2   vitamin B-12 (CYANOCOBALAMIN) 500 MCG tablet Take 500 mcg by mouth daily.     albuterol (VENTOLIN HFA) 108 (90 Base) MCG/ACT inhaler USE 2 INHALATIONS BY MOUTH EVERY 6 HOURS AS NEEDED FOR WHEEZING  OR SHORTNESS OF BREATH 34 g 2   ipratropium (ATROVENT HFA) 17 MCG/ACT inhaler Inhale 2 puffs into the lungs in the morning and at bedtime. 12.9 each 5   No facility-administered medications prior to visit.     EXAM:  BP 120/88 (BP Location: Right Arm, Patient Position: Sitting, Cuff Size: Normal)   Pulse 90   Temp (!) 97.4 F (36.3 C) (Oral)   Ht 5' 3.75" (1.619 m)   Wt 176 lb 11.2 oz (80.2 kg)   SpO2 99%   BMI 30.57 kg/m   Body mass index is 30.57 kg/m. Wt Readings from Last 3 Encounters:  07/22/22 176 lb 11.2 oz (80.2 kg)  04/07/22 178 lb 9.6 oz (81 kg)  03/04/22  176 lb (79.8 kg)    Physical Exam: Vital signs reviewed NWG:NFAO is a well-developed well-nourished alert cooperative    who appearsr stated age in no acute distress.  HEENT: normocephalic atraumatic , Eyes: PERRL EOM's full, conjunctiva clear, Nares: paten,t no deformity discharge or tenderness., Ears: no deformity EAC's clear TMs with normal landmarks. Mouth: clear OP, no lesions, edema.  Moist mucous membranes. Dentition in adequate repair. NECK: supple without masses, thyromegaly or bruits. CHEST/PULM:  Clear to auscultation and percussion breath sounds equal no wheeze , rales or rhonchi. No chest wall deformities or tenderness. Breast: normal by inspection . No dimpling, discharge, masses, tenderness or discharge   CV: PMI is nondisplaced, S1 S2 no gallops, murmurs, rubs. Peripheral pulses are full without delay.No JVD .  ABDOMEN: Bowel sounds normal nontender  No guard or rebound, no hepato splenomegal no CVA tenderness.   Extremtities:  No clubbing cyanosis or edema, no acute joint swelling or redness no focal atrophy NEURO:  Oriented x3, cranial nerves 3-12 appear to be intact, no obvious focal weakness,gait within normal limits no abnormal reflexes or asymmetrical  SKIN: No acute rashes normal turgor, color, no bruising or petechiae. PSYCH: Oriented, good eye contact, no obvious depression anxiety, cognition and judgment appear normal. LN: no cervical axillary iadenopathy  Lab Results  Component Value Date   WBC 5.5 07/22/2022   HGB 13.5 07/22/2022   HCT 40.2 07/22/2022   PLT 217.0 07/22/2022   GLUCOSE 103 (H) 07/22/2022   CHOL 177 07/22/2022   TRIG 143.0 07/22/2022   HDL 59.80 07/22/2022   LDLDIRECT 129.0 11/28/2018   LDLCALC 89 07/22/2022   ALT 22 07/22/2022   AST 19 07/22/2022   NA 135 07/22/2022   K 3.6 07/22/2022   CL 100 07/22/2022   CREATININE 0.63 07/22/2022   BUN 19 07/22/2022   CO2 25 07/22/2022   TSH 0.94 07/22/2022   HGBA1C 5.8 07/22/2022    BP Readings from Last 3 Encounters:  07/22/22 120/88  04/07/22 122/74  03/04/22 110/80    Lab considerations reviewed with patient  Lipid profile and apo b in order set from cards added  on  is fasting today  ASSESSMENT AND PLAN:  Discussed the following assessment and plan:    ICD-10-CM   1. Visit for preventive health examination  Z00.00     2. Hx of vertigo  Z87.898    2 hour episode  see text  resolved    3. Hyperlipidemia, unspecified hyperlipidemia type  E78.5 CBC with Differential/Platelet    Comprehensive metabolic panel    Lipid panel    TSH    Apolipoprotein B    4. Medication management  Z79.899 CBC with Differential/Platelet    Comprehensive metabolic panel    Lipid panel    TSH    Apolipoprotein B     Hemoglobin A1c    5. Chronic anticoagulation  Z79.01 CBC with Differential/Platelet    Comprehensive metabolic panel    Lipid panel    TSH    6. Persistent atrial fibrillation (HCC)  I48.19 CBC with Differential/Platelet    Comprehensive metabolic panel    Lipid panel    TSH    7. Fasting hyperglycemia  R73.01 Hemoglobin A1c    8. History of COVID-19 pneumonia  Z86.16     Episode of vertigo sounds bestibular and not cns  no residual .   Updated lab monitoring will add apo b as  in system for  Cards  is on repatha for  months .  Resp sx resolved with breo  fortunately Afib to be followed by cards . Failed CV in last year.   She feels  problems since she had Covid infection..  Return for depending on results and yearly .  Patient Care Team: Eman Morimoto, Neta Mends, MD as PCP - General (Internal Medicine) Pricilla Riffle, MD as PCP - Cardiology (Cardiology) Nita Sells, MD (Dermatology) Myrtie Neither Andreas Blower, MD as Consulting Physician (Gastroenterology) Coralyn Helling, MD as Consulting Physician (Pulmonary Disease) Patient Instructions  Good to see you today . Will share lab results with cardiology team. Glad your breathing is much better .   If vertigo recurs seek medical evaluation .  Sounds like inner ear vestibular cause.    Neta Mends. Lovett Coffin M.D.

## 2022-07-22 NOTE — Patient Instructions (Signed)
Good to see you today . Will share lab results with cardiology team. Glad your breathing is much better .   If vertigo recurs seek medical evaluation .  Sounds like inner ear vestibular cause.

## 2022-07-23 LAB — APOLIPOPROTEIN B: Apolipoprotein B: 85 mg/dL (ref <90)

## 2022-07-23 NOTE — Progress Notes (Signed)
Results are normal in range  or stable . LDL is 89  Forwarding  results to Dr Harrington Challenger team

## 2022-07-24 NOTE — Progress Notes (Signed)
Lipo B about the same

## 2022-07-27 ENCOUNTER — Telehealth: Payer: Self-pay

## 2022-07-27 NOTE — Telephone Encounter (Signed)
Pt reports that she is only using Repatha and not the Livalo. Will route back to Dr Harrington Challenger for review.

## 2022-07-27 NOTE — Telephone Encounter (Signed)
-----   Message from Fay Records, MD sent at 07/23/2022  9:51 PM EST ----- Reviewed labs  Please clarify that she is on Repatha and Livalo. A1C is 5.9     watch carbs, cut back on carbs and sugar ----- Message ----- From: Burnis Medin, MD Sent: 07/23/2022   8:21 AM EST To: Fay Records, MD; [facility]  Results are normal in range  or stable . LDL is 89  Forwarding  results to Dr Harrington Challenger team

## 2022-07-27 NOTE — Telephone Encounter (Signed)
  IF she can tolerate Livalo I would add back   LDL should come down a little  Penny Barnett    My Chart sent to the pt for review.

## 2022-07-30 NOTE — Telephone Encounter (Signed)
Continue on current meds

## 2022-08-03 ENCOUNTER — Encounter: Payer: Self-pay | Admitting: Internal Medicine

## 2022-08-04 ENCOUNTER — Telehealth: Payer: Self-pay | Admitting: Pharmacist

## 2022-08-04 NOTE — Telephone Encounter (Signed)
PA approved through 06/15/23 

## 2022-08-04 NOTE — Telephone Encounter (Signed)
Pt with new insurance plan, Repatha PA submitted, key Key: [id number].

## 2022-08-18 IMAGING — DX DG CHEST 2V
2 series · 2 of 2 positions shown · non-contrast
Comparison: 10/02/2020

CLINICAL DATA: 70-year-old female with a history of cough and
pneumonia

EXAM:
CHEST - 2 VIEW

[chest pa]
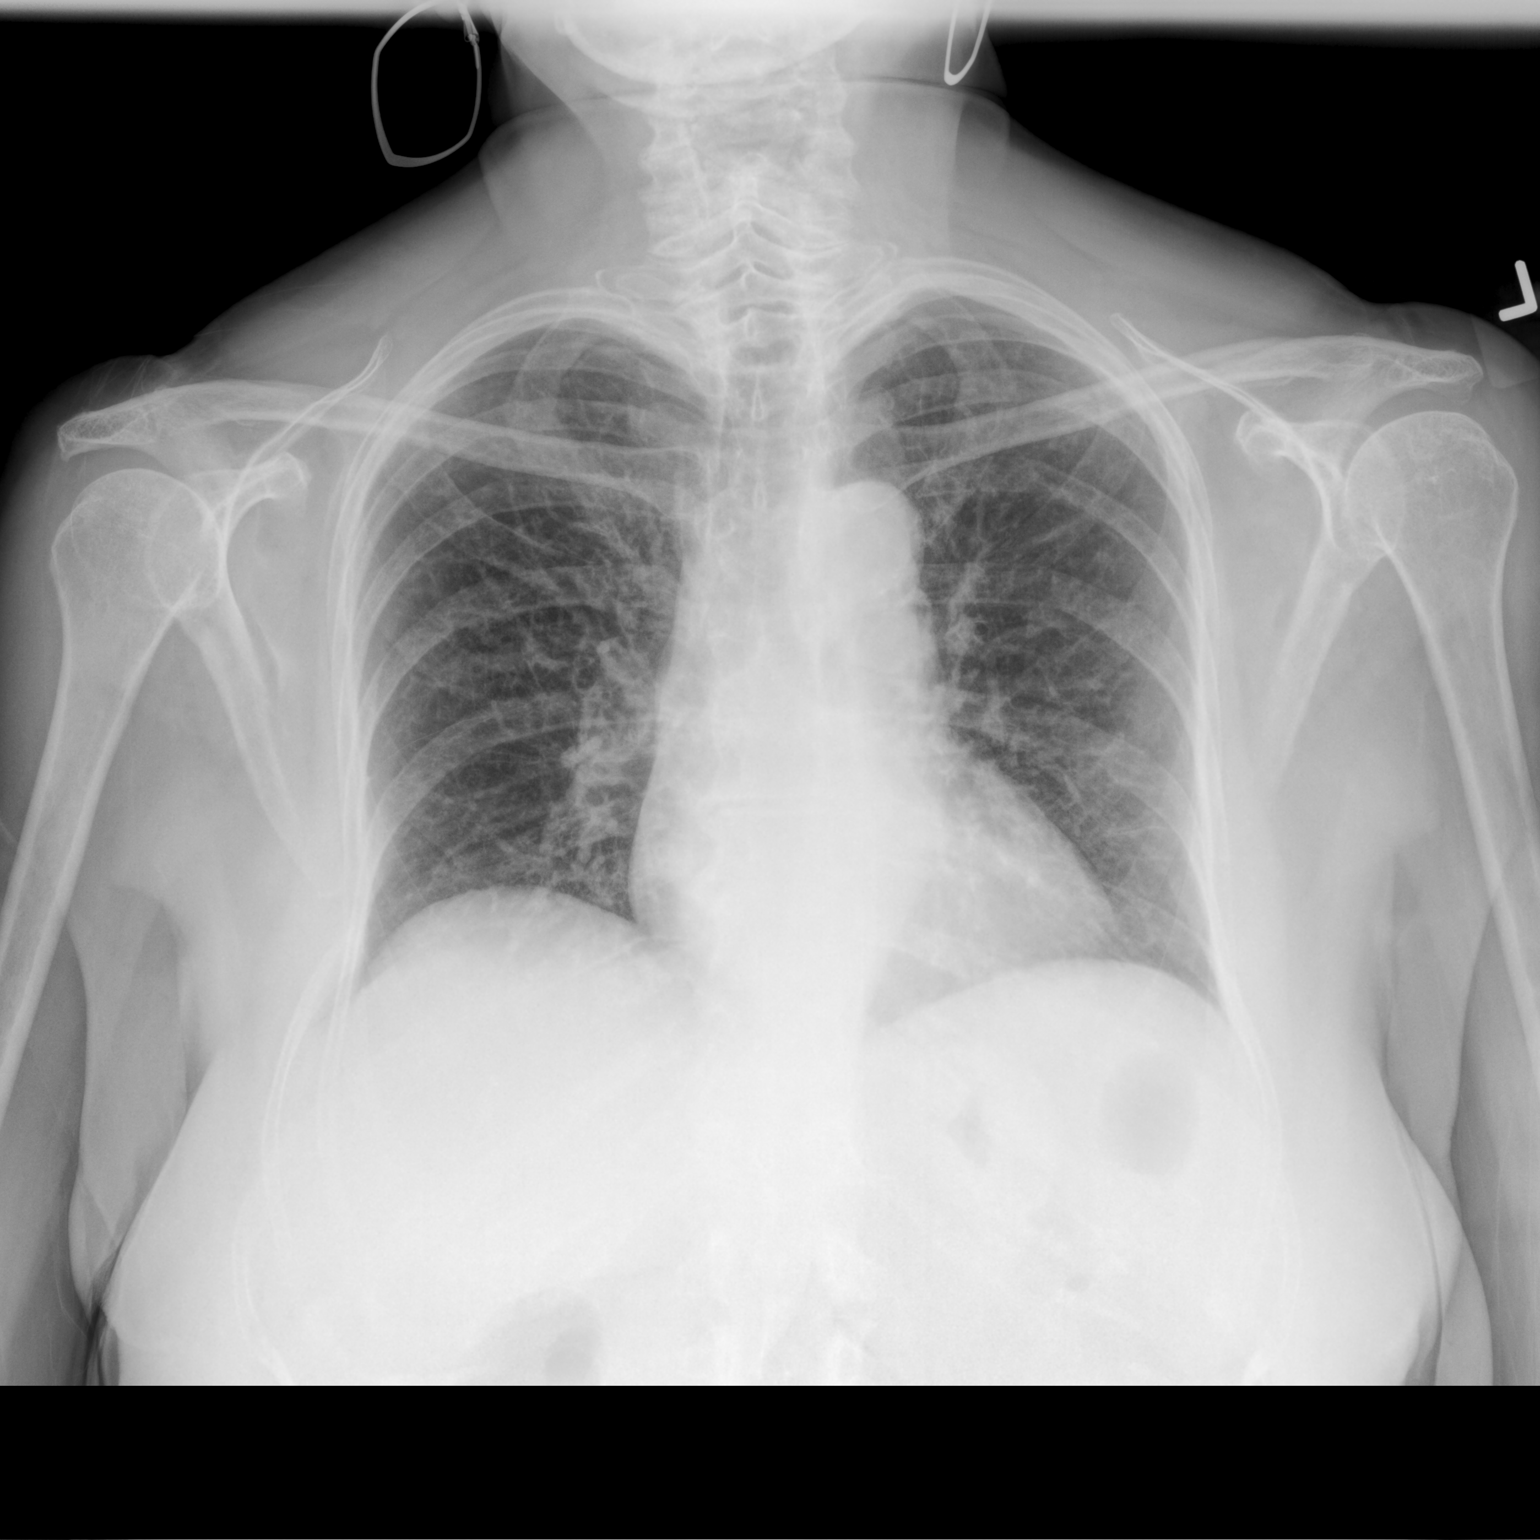

[chest lat]
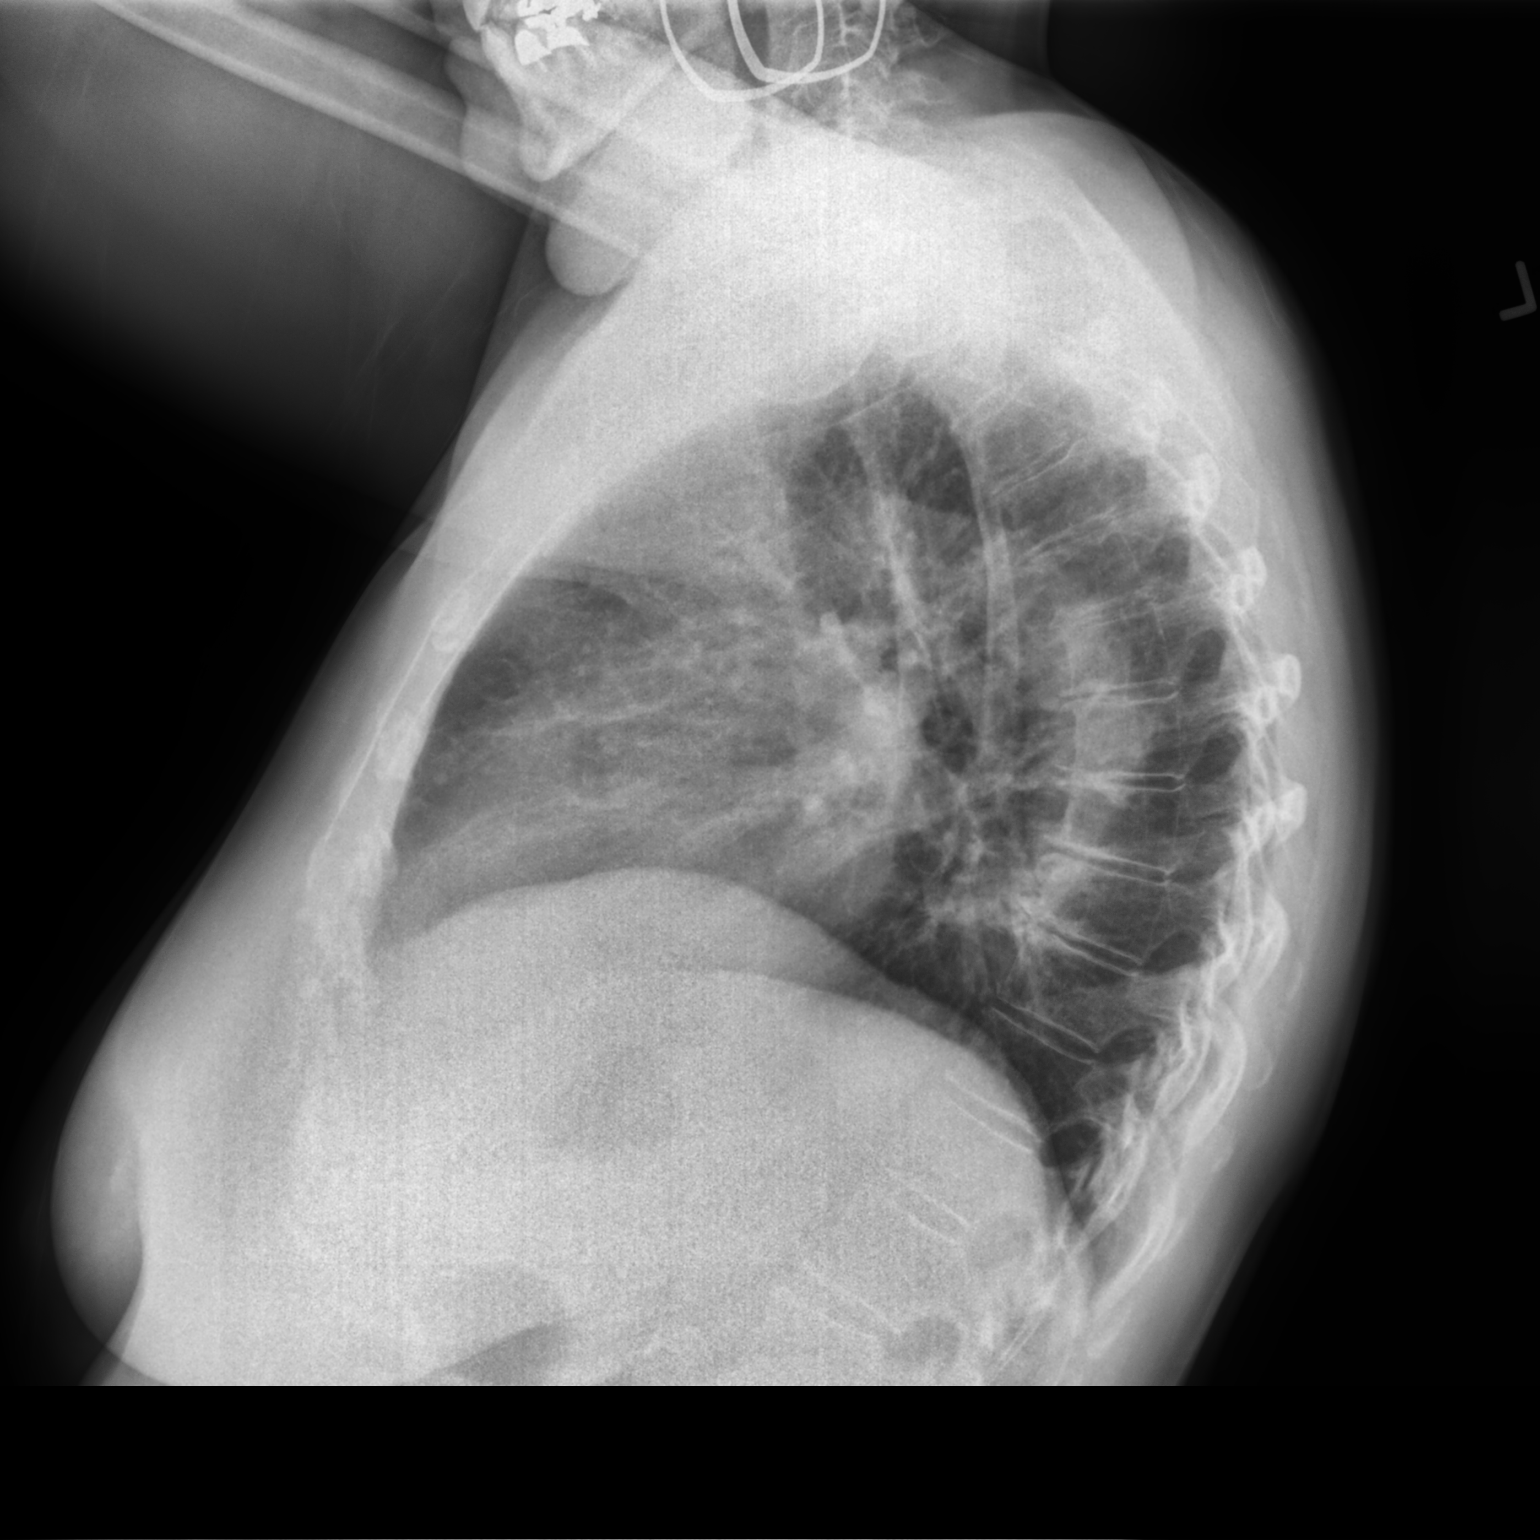

[2 of 2 positions shown; findings below may reference images not displayed]

FINDINGS: Cardiomediastinal silhouette unchanged in size and contour. No
evidence of central vascular congestion. No interlobular septal
thickening.

Lung volumes remain low. Coarsened interstitial markings throughout,
similar to the prior, with no new confluent airspace disease.

No pneumothorax or pleural effusion.

No acute displaced fracture. Degenerative changes of the spine.
IMPRESSION: Similar appearance of the chest x-ray with low lung volumes and
likely chronic changes with no evidence of acute cardiopulmonary
disease

## 2022-08-19 ENCOUNTER — Ambulatory Visit: Payer: Medicare Other | Admitting: Dermatology

## 2022-08-24 ENCOUNTER — Encounter: Payer: Self-pay | Admitting: Internal Medicine

## 2022-08-24 MED ORDER — TRIAMTERENE-HCTZ 37.5-25 MG PO TABS: 0.5000 | ORAL_TABLET | Freq: Every day | ORAL | 2 refills | Status: DC

## 2022-08-25 ENCOUNTER — Other Ambulatory Visit: Payer: Self-pay

## 2022-08-25 DIAGNOSIS — J45909 Unspecified asthma, uncomplicated: Secondary | ICD-10-CM

## 2022-08-25 MED ORDER — MONTELUKAST SODIUM 10 MG PO TABS: ORAL_TABLET | ORAL | 1 refills | Status: DC

## 2022-08-26 ENCOUNTER — Telehealth: Payer: Self-pay | Admitting: Internal Medicine

## 2022-08-26 NOTE — Telephone Encounter (Signed)
Contacted Kacelyn Edrick Oh to schedule their annual wellness visit. Appointment made for 08/31/22.  [facility] AWV direct phone # [phone]   Spoke to patient due to schedule change appointment on 3/18 has been changed to phone appt Patient aware

## 2022-08-31 ENCOUNTER — Ambulatory Visit (INDEPENDENT_AMBULATORY_CARE_PROVIDER_SITE_OTHER): Payer: Medicare HMO

## 2022-08-31 VITALS — Ht 63.75 in | Wt 176.0 lb

## 2022-08-31 DIAGNOSIS — Z0000 Encounter for general adult medical examination without abnormal findings: Secondary | ICD-10-CM | POA: Diagnosis not present

## 2022-08-31 NOTE — Patient Instructions (Addendum)
[name] , Thank you for taking time to come for your Medicare Wellness Visit. I appreciate your ongoing commitment to your health goals. Please review the following plan we discussed and let me know if I can assist you in the future.   These are the goals we discussed:  Goals       Eat Healthy (pt-stated)      Lose 25 lbs. (pt-stated)      Would like to lose 20lbs       Patient Stated      Will maintain this year  Would like to lose a few more weight Would like to get down below 150; Does not eat bread, potatoes etc Diet mayo         This is a list of the screening recommended for you and due dates:  Health Maintenance  Topic Date Due   Flu Shot  09/13/2022*   COVID-19 Vaccine (1) 09/16/2022*   Zoster (Shingles) Vaccine (1 of 2) 10/20/2022*   Mammogram  01/01/2023   Medicare Annual Wellness Visit  08/31/2023   Colon Cancer Screening  04/27/2026   Pneumonia Vaccine  Completed   DEXA scan (bone density measurement)  Completed   Hepatitis C Screening: USPSTF Recommendation to screen - Ages 36-79 yo.  Completed   HPV Vaccine  Aged Out   DTaP/Tdap/Td vaccine  Discontinued  *Topic was postponed. The date shown is not the original due date.    Advanced directives: Advance directive discussed with you today. Even though you declined this today, please call our office should you change your mind, and we can give you the proper paperwork for you to fill out.   Conditions/risks identified: None  Next appointment: Follow up in one year for your annual wellness visit    Preventive Care 65 Years and Older, Female Preventive care refers to lifestyle choices and visits with your health care provider that can promote health and wellness. What does preventive care include? A yearly physical exam. This is also called an annual well check. Dental exams once or twice a year. Routine eye exams. Ask your health care provider how often you should have your eyes checked. Personal  lifestyle choices, including: Daily care of your teeth and gums. Regular physical activity. Eating a healthy diet. Avoiding tobacco and drug use. Limiting alcohol use. Practicing safe sex. Taking low-dose aspirin every day. Taking vitamin and mineral supplements as recommended by your health care provider. What happens during an annual well check? The services and screenings done by your health care provider during your annual well check will depend on your age, overall health, lifestyle risk factors, and family history of disease. Counseling  Your health care provider may ask you questions about your: Alcohol use. Tobacco use. Drug use. Emotional well-being. Home and relationship well-being. Sexual activity. Eating habits. History of falls. Memory and ability to understand (cognition). Work and work Statistician. Reproductive health. Screening  You may have the following tests or measurements: Height, weight, and BMI. Blood pressure. Lipid and cholesterol levels. These may be checked every 5 years, or more frequently if you are over 17 years old. Skin check. Lung cancer screening. You may have this screening every year starting at age 30 if you have a 30-pack-year history of smoking and currently smoke or have quit within the past 15 years. Fecal occult blood test (FOBT) of the stool. You may have this test every year starting at age 27. Flexible sigmoidoscopy or colonoscopy. You may have a sigmoidoscopy  every 5 years or a colonoscopy every 10 years starting at age 8. Hepatitis C blood test. Hepatitis B blood test. Sexually transmitted disease (STD) testing. Diabetes screening. This is done by checking your blood sugar (glucose) after you have not eaten for a while (fasting). You may have this done every 1-3 years. Bone density scan. This is done to screen for osteoporosis. You may have this done starting at age 62. Mammogram. This may be done every 1-2 years. Talk to your  health care provider about how often you should have regular mammograms. Talk with your health care provider about your test results, treatment options, and if necessary, the need for more tests. Vaccines  Your health care provider may recommend certain vaccines, such as: Influenza vaccine. This is recommended every year. Tetanus, diphtheria, and acellular pertussis (Tdap, Td) vaccine. You may need a Td booster every 10 years. Zoster vaccine. You may need this after age 73. Pneumococcal 13-valent conjugate (PCV13) vaccine. One dose is recommended after age 19. Pneumococcal polysaccharide (PPSV23) vaccine. One dose is recommended after age 72. Talk to your health care provider about which screenings and vaccines you need and how often you need them. This information is not intended to replace advice given to you by your health care provider. Make sure you discuss any questions you have with your health care provider. Document Released: 06/28/2015 Document Revised: 02/19/2016 Document Reviewed: 04/02/2015 Elsevier Interactive Patient Education  2017 [place] Prevention in the Home Falls can cause injuries. They can happen to people of all ages. There are many things you can do to make your home safe and to help prevent falls. What can I do on the outside of my home? Regularly fix the edges of walkways and driveways and fix any cracks. Remove anything that might make you trip as you walk through a door, such as a raised step or threshold. Trim any bushes or trees on the path to your home. Use bright outdoor lighting. Clear any walking paths of anything that might make someone trip, such as rocks or tools. Regularly check to see if handrails are loose or broken. Make sure that both sides of any steps have handrails. Any raised decks and porches should have guardrails on the edges. Have any leaves, snow, or ice cleared regularly. Use sand or salt on walking paths during winter. Clean  up any spills in your garage right away. This includes oil or grease spills. What can I do in the bathroom? Use night lights. Install grab bars by the toilet and in the tub and shower. Do not use towel bars as grab bars. Use non-skid mats or decals in the tub or shower. If you need to sit down in the shower, use a plastic, non-slip stool. Keep the floor dry. Clean up any water that spills on the floor as soon as it happens. Remove soap buildup in the tub or shower regularly. Attach bath mats securely with double-sided non-slip rug tape. Do not have throw rugs and other things on the floor that can make you trip. What can I do in the bedroom? Use night lights. Make sure that you have a light by your bed that is easy to reach. Do not use any sheets or blankets that are too big for your bed. They should not hang down onto the floor. Have a firm chair that has side arms. You can use this for support while you get dressed. Do not have throw rugs and other things  on the floor that can make you trip. What can I do in the kitchen? Clean up any spills right away. Avoid walking on wet floors. Keep items that you use a lot in easy-to-reach places. If you need to reach something above you, use a strong step stool that has a grab bar. Keep electrical cords out of the way. Do not use floor polish or wax that makes floors slippery. If you must use wax, use non-skid floor wax. Do not have throw rugs and other things on the floor that can make you trip. What can I do with my stairs? Do not leave any items on the stairs. Make sure that there are handrails on both sides of the stairs and use them. Fix handrails that are broken or loose. Make sure that handrails are as long as the stairways. Check any carpeting to make sure that it is firmly attached to the stairs. Fix any carpet that is loose or worn. Avoid having throw rugs at the top or bottom of the stairs. If you do have throw rugs, attach them to the  floor with carpet tape. Make sure that you have a light switch at the top of the stairs and the bottom of the stairs. If you do not have them, ask someone to add them for you. What else can I do to help prevent falls? Wear shoes that: Do not have high heels. Have rubber bottoms. Are comfortable and fit you well. Are closed at the toe. Do not wear sandals. If you use a stepladder: Make sure that it is fully opened. Do not climb a closed stepladder. Make sure that both sides of the stepladder are locked into place. Ask someone to hold it for you, if possible. Clearly mark and make sure that you can see: Any grab bars or handrails. First and last steps. Where the edge of each step is. Use tools that help you move around (mobility aids) if they are needed. These include: Canes. Walkers. Scooters. Crutches. Turn on the lights when you go into a dark area. Replace any light bulbs as soon as they burn out. Set up your furniture so you have a clear path. Avoid moving your furniture around. If any of your floors are uneven, fix them. If there are any pets around you, be aware of where they are. Review your medicines with your doctor. Some medicines can make you feel dizzy. This can increase your chance of falling. Ask your doctor what other things that you can do to help prevent falls. This information is not intended to replace advice given to you by your health care provider. Make sure you discuss any questions you have with your health care provider. Document Released: 03/28/2009 Document Revised: 11/07/2015 Document Reviewed: 07/06/2014 Elsevier Interactive Patient Education  2017 [employer].

## 2022-08-31 NOTE — Progress Notes (Signed)
Subjective:   Penny Barnett is a 72 y.o. female who presents for Medicare Annual (Subsequent) preventive examination.  Review of Systems    Virtual Visit via Telephone Note  I connected with  Penny Barnett on 08/31/22 at 10:30 AM EDT by telephone and verified that I am speaking with the correct person using two identifiers.  Location: Patient: Home Provider: Office Persons participating in the virtual visit: patient/Nurse Health Advisor   I discussed the limitations, risks, security and privacy concerns of performing an evaluation and management service by telephone and the availability of in person appointments. The patient expressed understanding and agreed to proceed.  Interactive audio and video telecommunications were attempted between this nurse and patient, however failed, due to patient having technical difficulties OR patient did not have access to video capability.  We continued and completed visit with audio only.  Some vital signs may be absent or patient reported.   Penny Peaches, LPN  Cardiac Risk Factors include: advanced age (>68men, >84 women);hypertension     Objective:    Today's Vitals   08/31/22 1027  Weight: 176 lb (79.8 kg)  Height: 5' 3.75" (1.619 m)   Body mass index is 30.45 kg/m.     08/31/2022   10:32 AM 01/27/2022    8:03 AM 08/27/2021   10:56 AM 08/26/2020   10:29 AM 08/06/2020    7:39 AM 05/17/2020   11:55 PM 05/16/2020    5:36 PM  Advanced Directives  Does Patient Have a Medical Advance Directive? No No No No No No No  Does patient want to make changes to medical advance directive?    Yes (MAU/Ambulatory/Procedural Areas - Information given)     Would patient like information on creating a medical advance directive? No - Patient declined  No - Patient declined  No - Patient declined No - Patient declined     Current Medications (verified) Outpatient Encounter Medications as of 08/31/2022  Medication Sig   acetaminophen  (TYLENOL) 325 MG tablet Take 325-650 mg by mouth at bedtime as needed for moderate pain.   apixaban (ELIQUIS) 5 MG TABS tablet Take 1 tablet (5 mg total) by mouth 2 (two) times daily.   bisoprolol (ZEBETA) 5 MG tablet Take 1 tablet (5 mg total) by mouth daily.   Cholecalciferol (VITAMIN D-3) 125 MCG (5000 UT) TABS Take 5,000 Units by mouth daily.   Collagen-Boron-Hyaluronic Acid (MOVE FREE ULTRA JOINT HEALTH PO) Take 1 tablet by mouth daily.   diclofenac Sodium (VOLTAREN) 1 % GEL Apply 2 g topically daily as needed (Pain).   diphenhydramine-acetaminophen (TYLENOL PM) 25-500 MG TABS tablet Take 1 tablet by mouth at bedtime as needed (sleep).   Evolocumab (REPATHA SURECLICK) [id number] MG/ML SOAJ Inject 140 mg into the skin every 14 (fourteen) days.   fluticasone furoate-vilanterol (BREO ELLIPTA) 100-25 MCG/ACT AEPB Inhale 1 puff into the lungs daily.   montelukast (SINGULAIR) 10 MG tablet TAKE 1 TABLET BY MOUTH DAILY IN  THE MORNING   Multiple Vitamins-Iron (MULTIVITAMIN/IRON PO) Take 1 tablet by mouth daily.   Multiple Vitamins-Minerals (LUTEIN-ZEAXANTHIN PO) Take 1 tablet by mouth daily.   omeprazole (PRILOSEC) 20 MG capsule Take 20 mg by mouth daily.   potassium chloride SA (KLOR-CON M20) 20 MEQ tablet Take one tablet every other day alternating with two tablets on the opposite days.   Tetrahydrozoline HCl (VISINE OP) Place 1 drop into both eyes daily as needed (redness).   triamterene-hydrochlorothiazide (MAXZIDE-25) 37.5-25 MG tablet Take 0.5 tablets by  mouth daily.   vitamin B-12 (CYANOCOBALAMIN) 500 MCG tablet Take 500 mcg by mouth daily.   No facility-administered encounter medications on file as of 08/31/2022.    Allergies (verified) Statins and Penicillins   History: Past Medical History:  Diagnosis Date   Allergy    Anemia    Arthritis    hands   Asthma    as a child and then attack 1996  poss allergic to cat hair. ventilated x 2 . saw Dr Donneta Romberg then .    Atrial flutter ([place])     a. 12/2014 Echo: EF 60-65%, no rwma, mild MR;  b. S/P TEE/DCCV;  c. CHA2DS2VASc = 3-->Eliquis.   Cataract    Had surgery to remove   GERD (gastroesophageal reflux disease)    2000 taking otc prevacid   evey day.    Hyperlipidemia    Hypertension     3 meds controlled   Kidney stone    passed stone - no surgery required   Past Surgical History:  Procedure Laterality Date   CARDIOVERSION N/A 01/03/2015   Procedure: CARDIOVERSION;  Surgeon: Fay Records, MD;  Location: [facility];  Service: Cardiovascular;  Laterality: N/A;   CARDIOVERSION N/A 08/06/2020   Procedure: CARDIOVERSION;  Surgeon: Pixie Casino, MD;  Location[facility] ENDOSCOPY;  Service: Cardiovascular;  Laterality: N/A;   CARDIOVERSION N/A 01/27/2022   Procedure: CARDIOVERSION;  Surgeon: Freada Bergeron, MD;  Location: [facility] ENDOSCOPY;  Service: Cardiovascular;  Laterality: N/A;   CATARACT EXTRACTION Bilateral 10/14/2010   both eyes   COLONOSCOPY  02/2011   [facility] SURGERY Bilateral    x 2 - frozen shoulder rt 2006, 2000 left    TEE WITHOUT CARDIOVERSION N/A 01/03/2015   Procedure: TRANSESOPHAGEAL ECHOCARDIOGRAM (TEE);  Surgeon: Fay Records, MD;  Location: [facility] ENDOSCOPY;  Service: Cardiovascular;  Laterality: N/A;   TONSILLECTOMY     Family History  Problem Relation Age of Onset   Breast cancer Mother    Bladder Cancer Mother    Uterine cancer Sister    Alcohol abuse Father    Breast cancer Maternal Grandmother    Colon cancer Neg Hx    Heart attack Neg Hx    Stroke Neg Hx    Hypertension Neg Hx    Esophageal cancer Neg Hx    Rectal cancer Neg Hx    Stomach cancer Neg Hx    Social History   Socioeconomic History   Marital status: Single    Spouse name: Not on file   Number of children: Not on file   Years of education: Not on file   Highest education level: Not on file  Occupational History   Occupation: CSR  Tobacco Use   Smoking status: Former    Packs/day: 1.00    Years: 20.00     Additional pack years: 0.00    Total pack years: 20.00    Types: Cigarettes    Quit date: 06/21/1984    Years since quitting: 38.2   Smokeless tobacco: Never  Vaping Use   Vaping Use: Never used  Substance and Sexual Activity   Alcohol use: No    Alcohol/week: 0.0 standard drinks of alcohol   Drug use: No   Sexual activity: Not on file  Other Topics Concern   Not on file  Social History Narrative   [place]      In [place] for 30 years  Has family here   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    HS education  single   6 hours of sleep   Hh  Of 1  Pet dog and a horse.    Remote tobacco  1-2 ppd stopped 1988    Etoh couple times a month rare    ocass  Diet soda.             Social Determinants of Health   Financial Resource Strain: Low Risk  (08/31/2022)   Overall Financial Resource Strain (CARDIA)    Difficulty of Paying Living Expenses: Not hard at all  Food Insecurity: No Food Insecurity (08/31/2022)   Hunger Vital Sign    Worried About Running Out of Food in the Last Year: Never true    Ran Out of Food in the Last Year: Never true  Transportation Needs: No Transportation Needs (08/31/2022)   PRAPARE - Hydrologist (Medical): No    Lack of Transportation (Non-Medical): No  Physical Activity: Insufficiently Active (08/31/2022)   Exercise Vital Sign    Days of Exercise per Week: 3 days    Minutes of Exercise per Session: 30 min  Stress: No Stress Concern Present (08/31/2022)   [place]    Feeling of Stress : Not at all  Social Connections: Moderately Integrated (08/31/2022)   Social Connection and Isolation Panel [NHANES]    Frequency of Communication with Friends and Family: More than three times a week    Frequency of Social Gatherings with Friends and Family: More than three times a week    Attends Religious Services: More than  4 times per year    Active Member of [employer] or Organizations: Yes    Attends Music therapist: More than 4 times per year    Marital Status: Never married    Tobacco Counseling Counseling given: Not Answered   Clinical Intake:  Pre-visit preparation completed: Yes  Pain : No/denies pain     BMI - recorded: 30.45 Nutritional Status: BMI > 30  Obese Nutritional Risks: None Diabetes: No  How often do you need to have someone help you when you read instructions, pamphlets, or other written materials from your doctor or pharmacy?: 1 - Never  Diabetic?  No  Interpreter Needed?: No  Information entered by :: Rolene Arbour LPN   Activities of Daily Living    08/31/2022   10:31 AM 08/30/2022    9:41 PM  In your present state of health, do you have any difficulty performing the following activities:  Hearing? 0 0  Vision? 0 0  Difficulty concentrating or making decisions? 0 0  Walking or climbing stairs? 0 0  Dressing or bathing? 0 0  Doing errands, shopping? 0 0  Preparing Food and eating ? N N  Using the Toilet? N N  In the past six months, have you accidently leaked urine? N N  Do you have problems with loss of bowel control? N N  Managing your Medications? N N  Managing your Finances? N N  Housekeeping or managing your Housekeeping? N N    Patient Care Team: Panosh, Standley Brooking, MD as PCP - General (Internal Medicine) Fay Records, MD as PCP - Cardiology (Cardiology) Allyn Kenner, MD (Dermatology) Loletha Carrow Kirke Corin, MD as Consulting Physician (Gastroenterology) Chesley Mires, MD as Consulting Physician (Pulmonary Disease)  Indicate any recent Medical Services you may have received from other  than [facility] providers in the past year (date may be approximate).     Assessment:   This is a routine wellness examination for [place].  Hearing/Vision screen Hearing Screening - Comments:: Denies hearing difficulties   Vision Screening - Comments:: Wears rx  glasses - up to date with routine eye exams with  Deferred  Dietary issues and exercise activities discussed: Exercise limited by: None identified   Goals Addressed               This Visit's Progress     Eat Healthy (pt-stated)         Depression Screen    08/31/2022   10:30 AM 07/22/2022   10:00 AM 08/27/2021   10:51 AM 08/26/2020   10:31 AM 01/23/2020    9:21 AM 11/28/2018    9:52 AM 12/29/2017   11:17 AM  PHQ 2/9 Scores  PHQ - 2 Score 0 0 0 0 0 0 0  PHQ- 9 Score 0 3   0      Fall Risk    08/31/2022   10:32 AM 08/30/2022    9:41 PM 07/22/2022   10:00 AM 11/05/2021    9:25 AM 08/27/2021   10:55 AM  Fall Risk   Falls in the past year? 0 0 0 0 0  Number falls in past yr: 0  0 0 0  Injury with Fall? 0  0 0 0  Risk for fall due to : No Fall Risks  No Fall Risks No Fall Risks No Fall Risks  Follow up Falls prevention discussed  Falls evaluation completed Falls evaluation completed     FALL RISK PREVENTION PERTAINING TO THE HOME:  Any stairs in or around the home? No  If so, are there any without handrails? No  Home free of loose throw rugs in walkways, pet beds, electrical cords, etc? Yes  Adequate lighting in your home to reduce risk of falls? Yes   ASSISTIVE DEVICES UTILIZED TO PREVENT FALLS:  Life alert? No  Use of a cane, walker or w/c? No  Grab bars in the bathroom? No  Shower chair or bench in shower? Yes  Elevated toilet seat or a handicapped toilet? No    TIMED UP AND GO:  Was the test performed? No . Audio Visit   Cognitive Function:        08/31/2022   10:33 AM 08/27/2021   10:56 AM  6CIT Screen  What Year? 0 points 0 points  What month? 0 points 0 points  What time? 0 points 0 points  Count back from 20 0 points 0 points  Months in reverse 0 points 0 points  Repeat phrase 0 points 0 points  Total Score 0 points 0 points    Immunizations Immunization History  Administered Date(s) Administered   Fluad Quad(high Dose 65+) 03/11/2019    Influenza Split 04/13/2011   Influenza, High Dose Seasonal PF 02/12/2015, 04/05/2018   Influenza,inj,Quad PF,6+ Mos 03/29/2017   Pneumococcal Conjugate-13 10/16/2013, 10/23/2014   Pneumococcal Polysaccharide-23 10/25/2015      Flu Vaccine status: Declined, Education has been provided regarding the importance of this vaccine but patient still declined. Advised may receive this vaccine at local pharmacy or Health Dept. Aware to provide a copy of the vaccination record if obtained from local pharmacy or Health Dept. Verbalized acceptance and understanding.  Pneumococcal vaccine status: Up to date  Covid-19 vaccine status: Declined, Education has been provided regarding the importance of this vaccine but patient still declined.  Advised may receive this vaccine at local pharmacy or Health Dept.or vaccine clinic. Aware to provide a copy of the vaccination record if obtained from local pharmacy or Health Dept. Verbalized acceptance and understanding.  Qualifies for Shingles Vaccine? Yes   Zostavax completed No   Shingrix Completed?: No.    Education has been provided regarding the importance of this vaccine. Patient has been advised to call insurance company to determine out of pocket expense if they have not yet received this vaccine. Advised may also receive vaccine at local pharmacy or Health Dept. Verbalized acceptance and understanding.  Screening Tests Health Maintenance  Topic Date Due   INFLUENZA VACCINE  09/13/2022 (Originally 01/13/2022)   COVID-19 Vaccine (1) 09/16/2022 (Originally 12/06/1955)   Zoster Vaccines- Shingrix (1 of 2) 10/20/2022 (Originally 12/07/1969)   MAMMOGRAM  01/01/2023   Medicare Annual Wellness (AWV)  08/31/2023   COLONOSCOPY (Pts 45-30yrs Insurance coverage will need to be confirmed)  04/27/2026   Pneumonia Vaccine 77+ Years old  Completed   DEXA SCAN  Completed   Hepatitis C Screening  Completed   HPV VACCINES  Aged Out   DTaP/Tdap/Td  Discontinued    Health  Maintenance  There are no preventive care reminders to display for this patient.   Colorectal cancer screening: Type of screening: Colonoscopy. Completed 04/27/16. Repeat every 10 years  Mammogram status: Completed 12/31/21. Repeat every year  Bone Density status: Completed 02/16/17. Results reflect: Bone density results: OSTEOPOROSIS. Repeat every   years.  Lung Cancer Screening: (Low Dose CT Chest recommended if Age 9-80 years, 30 pack-year currently smoking OR have quit w/in 15years.) does not qualify.    Additional Screening:  Hepatitis C Screening: does qualify; Completed 12/02/12  Vision Screening: Recommended annual ophthalmology exams for early detection of glaucoma and other disorders of the eye. Is the patient up to date with their annual eye exam?  Yes  Who is the provider or what is the name of the office in which the patient attends annual eye exams? Deferred If pt is not established with a provider, would they like to be referred to a provider to establish care? No .   Dental Screening: Recommended annual dental exams for proper oral hygiene  Community Resource Referral / Chronic Care Management:  CRR required this visit?  No   CCM required this visit?  No      Plan:     I have personally reviewed and noted the following in the patient's chart:   Medical and social history Use of alcohol, tobacco or illicit drugs  Current medications and supplements including opioid prescriptions. Patient is not currently taking opioid prescriptions. Functional ability and status Nutritional status Physical activity Advanced directives List of other physicians Hospitalizations, surgeries, and ER visits in previous 12 months Vitals Screenings to include cognitive, depression, and falls Referrals and appointments  In addition, I have reviewed and discussed with patient certain preventive protocols, quality metrics, and best practice recommendations. A written personalized  care plan for preventive services as well as general preventive health recommendations were provided to patient.     Penny Peaches, LPN   [id number]   Nurse Notes: None

## 2022-09-02 ENCOUNTER — Encounter: Payer: Self-pay | Admitting: Internal Medicine

## 2022-09-07 NOTE — Telephone Encounter (Signed)
Ozempic or other weight loss drugs  or similar is not  paid for by any Medicare plan at this time  for weight  management .  ( Only diabetes )  In the future there may be  coverage for people with active heart disease  indication  .    These meds are very expensive  and part of weight loss is muscle mass .  And  at least  40 - 50 %  of weight is gained back if stopped.

## 2022-09-10 DIAGNOSIS — L570 Actinic keratosis: Secondary | ICD-10-CM | POA: Diagnosis not present

## 2022-09-10 DIAGNOSIS — L821 Other seborrheic keratosis: Secondary | ICD-10-CM | POA: Diagnosis not present

## 2022-09-10 DIAGNOSIS — D225 Melanocytic nevi of trunk: Secondary | ICD-10-CM | POA: Diagnosis not present

## 2022-09-21 ENCOUNTER — Encounter: Payer: Self-pay | Admitting: Internal Medicine

## 2022-09-25 ENCOUNTER — Ambulatory Visit (INDEPENDENT_AMBULATORY_CARE_PROVIDER_SITE_OTHER): Payer: Medicare HMO | Admitting: Adult Health

## 2022-09-25 VITALS — BP 110/80 | HR 82 | Temp 97.9°F | Ht 63.75 in | Wt 180.0 lb

## 2022-09-25 DIAGNOSIS — R600 Localized edema: Secondary | ICD-10-CM | POA: Diagnosis not present

## 2022-09-25 NOTE — Progress Notes (Signed)
Subjective:    Patient ID: Penny Barnett, female    DOB: 1951-04-15, 72 y.o.   MRN: [id number]  Conjunctivitis     72 year old female who  has a past medical history of Allergy, Anemia, Arthritis, Asthma, Atrial flutter, Cataract, GERD (gastroesophageal reflux disease), Hyperlipidemia, Hypertension, and Kidney stone.  She presents to the office today for an acute issue. She reports that over the last week she has had swelling  around both eyes but mostly on the right side mildly itchy eyes.  She is concerned she may have pinkeye.  She denies any burning, drainage, fevers, or chills.  At home she tried taking a Benadryl 1 evening and did not notice that this helped with the swelling.   Review of Systems See HPI   Past Medical History:  Diagnosis Date   Allergy    Anemia    Arthritis    hands   Asthma    as a child and then attack 1996  poss allergic to cat hair. ventilated x 2 . saw Dr [place] Callas then .    Atrial flutter    a. 12/2014 Echo: EF 60-65%, no rwma, mild MR;  b. S/P TEE/DCCV;  c. CHA2DS2VASc = 3-->Eliquis.   Cataract    Had surgery to remove   GERD (gastroesophageal reflux disease)    2000 taking otc prevacid   evey day.    Hyperlipidemia    Hypertension     3 meds controlled   Kidney stone    passed stone - no surgery required    Social History   Socioeconomic History   Marital status: Single    Spouse name: Not on file   Number of children: Not on file   Years of education: Not on file   Highest education level: 12th grade  Occupational History   Occupation: CSR  Tobacco Use   Smoking status: Former    Packs/day: 1.00    Years: 20.00    Additional pack years: 0.00    Total pack years: 20.00    Types: Cigarettes    Quit date: 06/21/1984    Years since quitting: 38.2   Smokeless tobacco: Never  Vaping Use   Vaping Use: Never used  Substance and Sexual Activity   Alcohol use: No    Alcohol/week: 0.0 standard drinks of alcohol   Drug use: No    Sexual activity: Not on file  Other Topics Concern   Not on file  Social History Narrative   [place]      [place] years  Has family here   Works 45 per week [employer]    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    HS education  single   6 hours of sleep   Hh  Of 1  Pet dog and a horse.    Remote tobacco  1-2 ppd stopped 1988    Etoh couple times a month rare    ocass  Diet soda.             Social Determinants of Health   Financial Resource Strain: Low Risk  (09/25/2022)   Overall Financial Resource Strain (CARDIA)    Difficulty of Paying Living Expenses: Not very hard  Food Insecurity: No Food Insecurity (09/25/2022)   Hunger Vital Sign    Worried About Running Out of Food in the Last Year: Never true    Ran Out of Food in the  Last Year: Never true  Transportation Needs: No Transportation Needs (09/25/2022)   PRAPARE - Administrator, Civil Service (Medical): No    Lack of Transportation (Non-Medical): No  Physical Activity: Sufficiently Active (09/25/2022)   Exercise Vital Sign    Days of Exercise per Week: 5 days    Minutes of Exercise per Session: 60 min  Recent Concern: Physical Activity - Insufficiently Active (08/31/2022)   Exercise Vital Sign    Days of Exercise per Week: 3 days    Minutes of Exercise per Session: 30 min  Stress: No Stress Concern Present (09/25/2022)   [employer] of Occupational Health - Occupational Stress Questionnaire    Feeling of Stress : Only a little  Social Connections: Moderately Integrated (09/25/2022)   Social Connection and Isolation Panel [NHANES]    Frequency of Communication with Friends and Family: Three times a week    Frequency of Social Gatherings with Friends and Family: Once a week    Attends Religious Services: 1 to 4 times per year    Active Member of Clubs or Organizations: No    Attends Engineer, structural: More than 4 times per year    Marital Status: Never married   Intimate Partner Violence: Not At Risk (08/31/2022)   Humiliation, Afraid, Rape, and Kick questionnaire    Fear of Current or Ex-Partner: No    Emotionally Abused: No    Physically Abused: No    Sexually Abused: No    Past Surgical History:  Procedure Laterality Date   CARDIOVERSION N/A 01/03/2015   Procedure: CARDIOVERSION;  Surgeon: Pricilla Riffle, MD;  Location: [facility] ENDOSCOPY;  Service: Cardiovascular;  Laterality: N/A;   CARDIOVERSION N/A 08/06/2020   Procedure: CARDIOVERSION;  Surgeon: [name], MD;  Location: [facility] ENDOSCOPY;  Service: Cardiovascular;  Laterality: N/A;   CARDIOVERSION N/A 01/27/2022   Procedure: CARDIOVERSION;  Surgeon: Meriam Sprague, MD;  Location: [facility] ENDOSCOPY;  Service: Cardiovascular;  Laterality: N/A;   CATARACT EXTRACTION Bilateral 10/14/2010   both eyes   COLONOSCOPY  02/2011   [facility] SURGERY Bilateral    x 2 - frozen shoulder rt 2006, 2000 left    TEE WITHOUT CARDIOVERSION N/A 01/03/2015   Procedure: TRANSESOPHAGEAL ECHOCARDIOGRAM (TEE);  Surgeon: Pricilla Riffle, MD;  Location: [facility] ENDOSCOPY;  Service: Cardiovascular;  Laterality: N/A;   TONSILLECTOMY      Family History  Problem Relation Age of Onset   Breast cancer Mother    Bladder Cancer Mother    Uterine cancer Sister    Alcohol abuse Father    Breast cancer Maternal Grandmother    Colon cancer Neg Hx    Heart attack Neg Hx    Stroke Neg Hx    Hypertension Neg Hx    Esophageal cancer Neg Hx    Rectal cancer Neg Hx    Stomach cancer Neg Hx     Allergies  Allergen Reactions   Statins     Joint and muscle pain   Penicillins Other (See Comments)    From childhood- reaction not recalled    Current Outpatient Medications on File Prior to Visit  Medication Sig Dispense Refill   acetaminophen (TYLENOL) 325 MG tablet Take 325-650 mg by mouth at bedtime as needed for moderate pain.     apixaban (ELIQUIS) 5 MG TABS tablet Take 1 tablet (5 mg total) by mouth 2 (two) times  daily. 180 tablet 3   bisoprolol (ZEBETA) 5 MG tablet Take 1  tablet (5 mg total) by mouth daily. 90 tablet 3   Cholecalciferol (VITAMIN D-3) 125 MCG (5000 UT) TABS Take 5,000 Units by mouth daily.     Collagen-Boron-Hyaluronic Acid (MOVE FREE ULTRA JOINT HEALTH PO) Take 1 tablet by mouth daily.     diclofenac Sodium (VOLTAREN) 1 % GEL Apply 2 g topically daily as needed (Pain).     diphenhydramine-acetaminophen (TYLENOL PM) 25-500 MG TABS tablet Take 1 tablet by mouth at bedtime as needed (sleep).     Evolocumab (REPATHA SURECLICK) 140 MG/ML SOAJ Inject 140 mg into the skin every 14 (fourteen) days. 2 mL 11   fluticasone furoate-vilanterol (BREO ELLIPTA) 100-25 MCG/ACT AEPB Inhale 1 puff into the lungs daily. 60 each 5   montelukast (SINGULAIR) 10 MG tablet TAKE 1 TABLET BY MOUTH DAILY IN  THE MORNING 100 tablet 1   Multiple Vitamins-Iron (MULTIVITAMIN/IRON PO) Take 1 tablet by mouth daily.     Multiple Vitamins-Minerals (LUTEIN-ZEAXANTHIN PO) Take 1 tablet by mouth daily.     omeprazole (PRILOSEC) 20 MG capsule Take 20 mg by mouth daily.     potassium chloride SA (KLOR-CON M20) 20 MEQ tablet Take one tablet every other day alternating with two tablets on the opposite days. 90 tablet 2   Tetrahydrozoline HCl (VISINE OP) Place 1 drop into both eyes daily as needed (redness).     triamterene-hydrochlorothiazide (MAXZIDE-25) 37.5-25 MG tablet Take 0.5 tablets by mouth daily. 45 tablet 2   vitamin B-12 (CYANOCOBALAMIN) 500 MCG tablet Take 500 mcg by mouth daily.     No current facility-administered medications on file prior to visit.    BP 110/80   Pulse 82   Temp 97.9 F (36.6 C) (Oral)   Ht 5' 3.75" (1.619 m)   Wt 180 lb (81.6 kg)   SpO2 95%   BMI 31.14 kg/m       Objective:   Physical Exam Vitals and nursing note reviewed.  Constitutional:      Appearance: Normal appearance.  Eyes:     General: Lids are normal.        Right eye: No discharge.     Conjunctiva/sclera:  Conjunctivae normal.     Comments: No signs of conjunctivitis.  She does have periorbital edema noted bilaterally, worse on the right than on the left.  Neurological:     Mental Status: She is alert.       Assessment & Plan:  1. Periorbital edema of both eyes -Advised that this is likely due to seasonal allergies and high pollen count.  We discussed treatment options including prednisone and over-the-counter antihistamines.  She does not want to do prednisone as she does not react well to the medication.  I advised her to use Zyrtec in the morning and can take Benadryl in the evening before bed.  This should start to resolve in the next few days. - Follow up as needed  Shirline Frees, NP

## 2022-09-28 ENCOUNTER — Other Ambulatory Visit: Payer: Self-pay | Admitting: Pulmonary Disease

## 2022-11-03 ENCOUNTER — Ambulatory Visit (HOSPITAL_BASED_OUTPATIENT_CLINIC_OR_DEPARTMENT_OTHER): Payer: Medicare HMO | Admitting: Pulmonary Disease

## 2022-11-03 ENCOUNTER — Encounter (HOSPITAL_BASED_OUTPATIENT_CLINIC_OR_DEPARTMENT_OTHER): Payer: Self-pay | Admitting: Pulmonary Disease

## 2022-11-03 VITALS — BP 112/80 | HR 83 | Temp 98.0°F | Ht 64.0 in | Wt 176.0 lb

## 2022-11-03 DIAGNOSIS — J4540 Moderate persistent asthma, uncomplicated: Secondary | ICD-10-CM

## 2022-11-03 DIAGNOSIS — Z9189 Other specified personal risk factors, not elsewhere classified: Secondary | ICD-10-CM | POA: Diagnosis not present

## 2022-11-03 NOTE — Patient Instructions (Signed)
Follow up in 1 year.

## 2022-11-03 NOTE — Progress Notes (Signed)
[facility]  Chief Complaint  Patient presents with   Follow-up    Follow up. Patient has no complaints.     Past Surgical History:  She  has a past surgical history that includes Cataract extraction (Bilateral, 10/14/2010); Shoulder surgery (Bilateral); TEE without cardioversion (N/A, 01/03/2015); Cardioversion (N/A, 01/03/2015); Tonsillectomy; Colonoscopy (02/2011); Cardioversion (N/A, 08/06/2020); and Cardioversion (N/A, 01/27/2022).  Past Medical History:  COVID 03 May 2020 and January 2023, Atrial fibrillation, Allergies, Anemia, Cataract, GERD, HLD, HTN, Nephrolithiasis  Constitutional:  BP 112/80 (BP Location: Left Arm, Patient Position: Sitting, Cuff Size: Normal)   Pulse 83   Temp 98 F (36.7 C) (Oral)   Ht 5\' 4"  (1.626 m)   Wt 176 lb (79.8 kg)   SpO2 94%   BMI 30.21 kg/m   Brief Summary:  Penny Barnett is a 72 y.o. female former smoker with asthma.      Subjective:   Breathing doing well since she changed to breo.  Hasn't needed albuterol much.  Not having cough, wheeze, congestion, or dyspnea.  Had trouble with eye allergy symptoms.  Used zyrtec.  She remains in A fib.  Had attempt at cardioversion twice.  She does snore at night.  Physical Exam:   Appearance - well kempt   ENMT - no sinus tenderness, no oral exudate, no LAN, Mallampati 4 airway, no stridor, scalloped tongue  Respiratory - equal breath sounds bilaterally, no wheezing or rales  CV - s1s2 regular rate and rhythm, no murmurs  Ext - no clubbing, no edema  Skin - no rashes  Psych - normal mood and affect     Pulmonary testing:  PFT 02/11/17 >> FEV1 2.11 (87%), FEV1% 82, TLC 5.27 (101%), DLCO 86% PFT 07/29/21 >> FEV1 1.87 (81%), FEV1% 83, TLC 4.43 (85%), DLCO 90% FeNO 08/04/21 >> 27  Chest Imaging:  CT angio chest 12/30/14 >> mild mosaic pattern Cardiac CT 09/24/21 >> mild emphysema  Cardiac Tests:  Echo 01/07/22 >> EF 60 to 65%, mild  LVH, RVSP 40.2 mmHg, severe LA dilation, mild MR, mod TR  Social History:  She  reports that she quit smoking about 38 years ago. Her smoking use included cigarettes. She has a 20.00 pack-year smoking history. She has never used smokeless tobacco. She reports that she does not drink alcohol and does not use drugs.  Family History:  Her family history includes Alcohol abuse in her father; Bladder Cancer in her mother; Breast cancer in her maternal grandmother and mother; Uterine cancer in her sister.     Assessment/Plan:   Allergic asthma and elevated FeNO. - previously on allergy shots in 1990's - allergy to cats - flovent wasn't effective anymore - continue breo 100 one puff daily, singulair 10 mg nightly - prn albuterol  Allergic rhinitis, conjunctivitis. - continue singulair - prn zyrtec, pataday, astepro depending on what her allergy symptoms are at the time  Atrial fibrillation. - followed by Dr. Dietrich Pates with [facility]  At risk for obstructive sleep apnea. - discussed how untreated sleep apnea can impact her health, particularly in relation to atrial fibrillation - she does snore and has upper airway findings suggestive of risk for sleep apnea - she will monitor her sleep pattern and discuss with cardiology, and then decide if she would like to pursue additional sleep testing   Time Spent Involved in Patient Care on Day of Examination:  35 minutes  Follow up:   Patient Instructions  Follow  up in 1 year  Medication List:   Allergies as of 11/03/2022       Reactions   Statins    Joint and muscle pain   Penicillins Other (See Comments)   From childhood- reaction not recalled        Medication List        Accurate as of Nov 03, 2022  9:29 AM. If you have any questions, ask your nurse or doctor.          acetaminophen 325 MG tablet Commonly known as: TYLENOL Take 325-650 mg by mouth at bedtime as needed for moderate pain.   apixaban 5 MG Tabs  tablet Commonly known as: ELIQUIS Take 1 tablet (5 mg total) by mouth 2 (two) times daily.   bisoprolol 5 MG tablet Commonly known as: ZEBETA Take 1 tablet (5 mg total) by mouth daily.   diclofenac Sodium 1 % Gel Commonly known as: VOLTAREN Apply 2 g topically daily as needed (Pain).   diphenhydramine-acetaminophen 25-500 MG Tabs tablet Commonly known as: TYLENOL PM Take 1 tablet by mouth at bedtime as needed (sleep).   fluticasone furoate-vilanterol 100-25 MCG/ACT Aepb Commonly known as: BREO ELLIPTA Inhale 1 puff into the lungs daily.   LUTEIN-ZEAXANTHIN PO Take 1 tablet by mouth daily.   montelukast 10 MG tablet Commonly known as: SINGULAIR TAKE 1 TABLET BY MOUTH DAILY IN  THE MORNING   MOVE FREE ULTRA JOINT HEALTH PO Take 1 tablet by mouth daily.   MULTIVITAMIN/IRON PO Take 1 tablet by mouth daily.   omeprazole 20 MG capsule Commonly known as: PRILOSEC Take 20 mg by mouth daily.   potassium chloride SA 20 MEQ tablet Commonly known as: Klor-Con M20 Take one tablet every other day alternating with two tablets on the opposite days.   Repatha SureClick 140 MG/ML Soaj Generic drug: Evolocumab Inject 140 mg into the skin every 14 (fourteen) days.   triamterene-hydrochlorothiazide 37.5-25 MG tablet Commonly known as: MAXZIDE-25 Take 0.5 tablets by mouth daily.   VISINE OP Place 1 drop into both eyes daily as needed (redness).   vitamin B-12 500 MCG tablet Commonly known as: CYANOCOBALAMIN Take 500 mcg by mouth daily.   Vitamin D-3 125 MCG (5000 UT) Tabs Take 5,000 Units by mouth daily.        Signature:  Coralyn Helling, MD [facility] Pulmonary/Critical Care Pager - [phone] 11/03/2022, 9:29 AM

## 2022-12-14 DIAGNOSIS — H5211 Myopia, right eye: Secondary | ICD-10-CM | POA: Diagnosis not present

## 2022-12-14 DIAGNOSIS — H43813 Vitreous degeneration, bilateral: Secondary | ICD-10-CM | POA: Diagnosis not present

## 2022-12-15 ENCOUNTER — Encounter: Payer: Self-pay | Admitting: Internal Medicine

## 2022-12-15 DIAGNOSIS — I429 Cardiomyopathy, unspecified: Secondary | ICD-10-CM

## 2022-12-15 DIAGNOSIS — R0609 Other forms of dyspnea: Secondary | ICD-10-CM

## 2022-12-15 DIAGNOSIS — I480 Paroxysmal atrial fibrillation: Secondary | ICD-10-CM

## 2022-12-15 DIAGNOSIS — Z79899 Other long term (current) drug therapy: Secondary | ICD-10-CM

## 2022-12-15 DIAGNOSIS — E785 Hyperlipidemia, unspecified: Secondary | ICD-10-CM

## 2022-12-15 DIAGNOSIS — I10 Essential (primary) hypertension: Secondary | ICD-10-CM

## 2022-12-15 DIAGNOSIS — E039 Hypothyroidism, unspecified: Secondary | ICD-10-CM

## 2022-12-15 NOTE — Telephone Encounter (Signed)
She should come in for a BMET and BNP before changing meds   She is on Maxzide

## 2022-12-22 ENCOUNTER — Other Ambulatory Visit: Payer: Self-pay | Admitting: Internal Medicine

## 2022-12-22 DIAGNOSIS — Z1231 Encounter for screening mammogram for malignant neoplasm of breast: Secondary | ICD-10-CM

## 2022-12-30 ENCOUNTER — Ambulatory Visit: Payer: Medicare HMO | Attending: Internal Medicine

## 2022-12-30 DIAGNOSIS — E785 Hyperlipidemia, unspecified: Secondary | ICD-10-CM

## 2022-12-30 DIAGNOSIS — E039 Hypothyroidism, unspecified: Secondary | ICD-10-CM | POA: Diagnosis not present

## 2022-12-30 DIAGNOSIS — I480 Paroxysmal atrial fibrillation: Secondary | ICD-10-CM | POA: Diagnosis not present

## 2022-12-30 DIAGNOSIS — I10 Essential (primary) hypertension: Secondary | ICD-10-CM | POA: Diagnosis not present

## 2022-12-30 DIAGNOSIS — R0609 Other forms of dyspnea: Secondary | ICD-10-CM | POA: Diagnosis not present

## 2022-12-30 DIAGNOSIS — Z79899 Other long term (current) drug therapy: Secondary | ICD-10-CM | POA: Diagnosis not present

## 2022-12-30 DIAGNOSIS — I429 Cardiomyopathy, unspecified: Secondary | ICD-10-CM | POA: Diagnosis not present

## 2022-12-31 LAB — BASIC METABOLIC PANEL
BUN/Creatinine Ratio: 25 (ref 12–28)
BUN: 20 mg/dL (ref 8–27)
CO2: 25 mmol/L (ref 20–29)
Calcium: 9.7 mg/dL (ref 8.7–10.3)
Chloride: 100 mmol/L (ref 96–106)
Creatinine, Ser: 0.79 mg/dL (ref 0.57–1.00)
Glucose: 113 mg/dL — ABNORMAL HIGH (ref 70–99)
Potassium: 3.4 mmol/L — ABNORMAL LOW (ref 3.5–5.2)
Sodium: 141 mmol/L (ref 134–144)
eGFR: 78 mL/min/{1.73_m2} (ref >59)

## 2022-12-31 LAB — PRO B NATRIURETIC PEPTIDE: NT-Pro BNP: 1407 pg/mL — ABNORMAL HIGH (ref 0–301)

## 2023-01-04 ENCOUNTER — Other Ambulatory Visit: Payer: Self-pay

## 2023-01-04 DIAGNOSIS — E876 Hypokalemia: Secondary | ICD-10-CM

## 2023-01-04 DIAGNOSIS — R7989 Other specified abnormal findings of blood chemistry: Secondary | ICD-10-CM

## 2023-01-04 DIAGNOSIS — Z79899 Other long term (current) drug therapy: Secondary | ICD-10-CM

## 2023-01-04 MED ORDER — POTASSIUM CHLORIDE CRYS ER 20 MEQ PO TBCR: EXTENDED_RELEASE_TABLET | ORAL | 2 refills | Status: DC

## 2023-01-04 MED ORDER — FUROSEMIDE 40 MG PO TABS: ORAL_TABLET | ORAL | 0 refills | Status: DC

## 2023-01-13 ENCOUNTER — Ambulatory Visit: Payer: Medicare HMO | Attending: Internal Medicine

## 2023-01-13 ENCOUNTER — Ambulatory Visit: Payer: Medicare HMO

## 2023-01-13 DIAGNOSIS — R7989 Other specified abnormal findings of blood chemistry: Secondary | ICD-10-CM

## 2023-01-13 DIAGNOSIS — Z79899 Other long term (current) drug therapy: Secondary | ICD-10-CM

## 2023-01-13 DIAGNOSIS — E876 Hypokalemia: Secondary | ICD-10-CM

## 2023-01-15 ENCOUNTER — Telehealth: Payer: Self-pay

## 2023-01-15 DIAGNOSIS — R0609 Other forms of dyspnea: Secondary | ICD-10-CM

## 2023-01-15 DIAGNOSIS — R7989 Other specified abnormal findings of blood chemistry: Secondary | ICD-10-CM

## 2023-01-15 DIAGNOSIS — Z79899 Other long term (current) drug therapy: Secondary | ICD-10-CM

## 2023-01-15 NOTE — Telephone Encounter (Signed)
Left a message for the pt to call back.  

## 2023-01-15 NOTE — Telephone Encounter (Signed)
-----   Message from [place] sent at 01/15/2023  3:10 PM EDT ----- Fluid is elevated  Kidney function is normal I would add lasix 40 mg daily with 10 KCL    Follow up BMET and BNP in 7 days

## 2023-01-18 MED ORDER — POTASSIUM CHLORIDE CRYS ER 20 MEQ PO TBCR: 40.0000 meq | EXTENDED_RELEASE_TABLET | Freq: Every day | ORAL | 3 refills | Status: DC

## 2023-01-18 MED ORDER — FUROSEMIDE 40 MG PO TABS: 40.0000 mg | ORAL_TABLET | Freq: Every day | ORAL | 3 refills | Status: DC

## 2023-01-18 NOTE — Telephone Encounter (Signed)
Reviewed recommendations w patient. She will begin lasix 40 mg daily She already takes potassium 20 meq daily, and on the days she uses lasix she takes 40 meq.  So, she will take potassium 40 meq daily.  Will not be able to return for blood work until 01/26/23.  Appointment made.

## 2023-01-26 ENCOUNTER — Ambulatory Visit: Payer: Medicare HMO

## 2023-01-27 ENCOUNTER — Ambulatory Visit
Admission: RE | Admit: 2023-01-27 | Discharge: 2023-01-27 | Disposition: A | Discharge status: Home / Self Care | Payer: Medicare HMO | Source: Ambulatory Visit | Attending: Internal Medicine | Admitting: Internal Medicine

## 2023-01-27 ENCOUNTER — Ambulatory Visit: Payer: Medicare HMO | Attending: Cardiovascular Disease

## 2023-01-27 DIAGNOSIS — Z1231 Encounter for screening mammogram for malignant neoplasm of breast: Secondary | ICD-10-CM

## 2023-01-27 DIAGNOSIS — Z79899 Other long term (current) drug therapy: Secondary | ICD-10-CM

## 2023-01-27 DIAGNOSIS — R0609 Other forms of dyspnea: Secondary | ICD-10-CM | POA: Diagnosis not present

## 2023-01-27 DIAGNOSIS — R7989 Other specified abnormal findings of blood chemistry: Secondary | ICD-10-CM | POA: Diagnosis not present

## 2023-01-28 DIAGNOSIS — E876 Hypokalemia: Secondary | ICD-10-CM

## 2023-01-28 DIAGNOSIS — R7989 Other specified abnormal findings of blood chemistry: Secondary | ICD-10-CM

## 2023-01-28 DIAGNOSIS — Z79899 Other long term (current) drug therapy: Secondary | ICD-10-CM

## 2023-01-28 LAB — BASIC METABOLIC PANEL
BUN/Creatinine Ratio: 30 — ABNORMAL HIGH (ref 12–28)
BUN: 22 mg/dL (ref 8–27)
CO2: 25 mmol/L (ref 20–29)
Calcium: 9.6 mg/dL (ref 8.7–10.3)
Chloride: 100 mmol/L (ref 96–106)
Creatinine, Ser: 0.74 mg/dL (ref 0.57–1.00)
Glucose: 100 mg/dL — ABNORMAL HIGH (ref 70–99)
Potassium: 4.3 mmol/L (ref 3.5–5.2)
Sodium: 139 mmol/L (ref 134–144)
eGFR: 85 mL/min/{1.73_m2} (ref >59)

## 2023-01-28 LAB — PRO B NATRIURETIC PEPTIDE: NT-Pro BNP: 986 pg/mL — ABNORMAL HIGH (ref 0–301)

## 2023-01-28 MED ORDER — FUROSEMIDE 40 MG PO TABS: 40.0000 mg | ORAL_TABLET | Freq: Every day | ORAL | 3 refills | Status: DC

## 2023-02-17 IMAGING — CT CT CARDIAC CORONARY ARTERY CALCIUM SCORE
3 series · 14 of 20 positions shown, 16 images · non-contrast
Comparison: CT chest from 2298
COMPARISON: CT chest from 2298

Addendum:
EXAM:
OVER-READ INTERPRETATION  CT CHEST

The following report is an over-read performed by radiologist Dr. KOTOKU
Jumper [REDACTED] on 09/24/2021. This over-read
does not include interpretation of cardiac or coronary anatomy or
pathology. The coronary calcium score interpretation by the
cardiologist is attached.
TECHNIQUE: A gated, non-contrast computed tomography scan of the heart was
performed using 3mm slice thickness. Axial images were analyzed on a
dedicated workstation. Calcium scoring of the coronary arteries was
performed using the Agatston method.

[Series 2: cascseq 2.0 sa36 (id) (id) · axial · 0.35mm/px · z∈[-200,-120]mm · 4 of 68 slices shown]
[im 14/68  vessel]
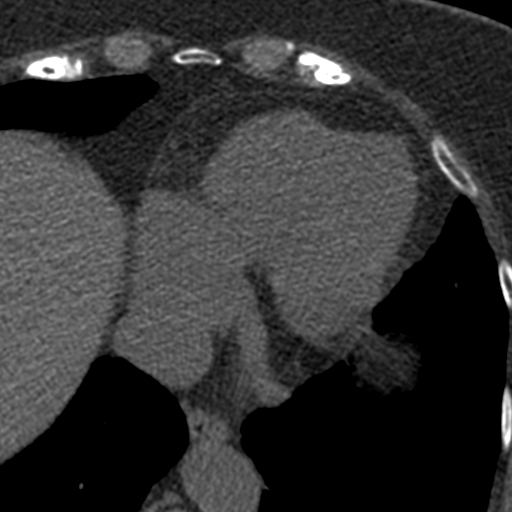
[im 27/68  vessel]
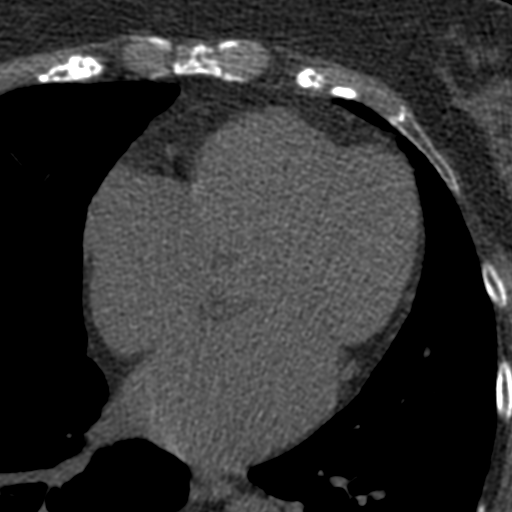
[im 41/68  vessel]
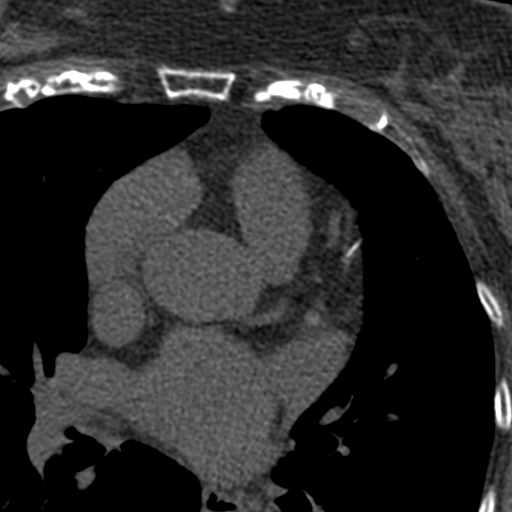
[im 54/68  vessel]
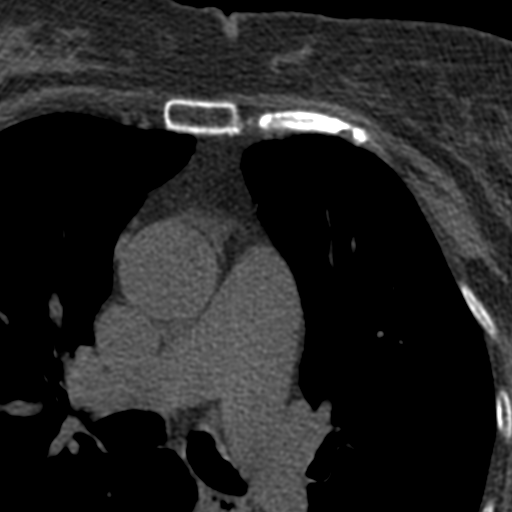

[Series 3: cascseq 2.0 bf37 st · axial · 0.62mm/px · z∈[-204,-116]mm · 5 of 68 slices shown, 7 images]
[im 12/68  vessel]
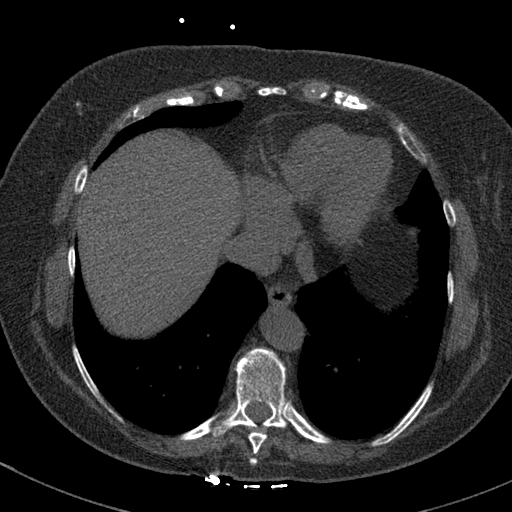
[im 12/68  lung]
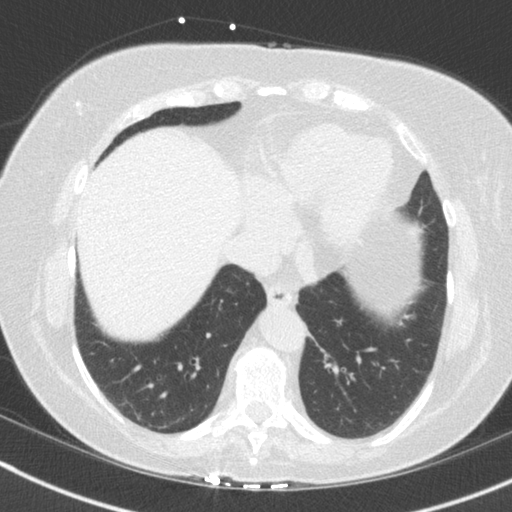
[im 23/68  vessel]
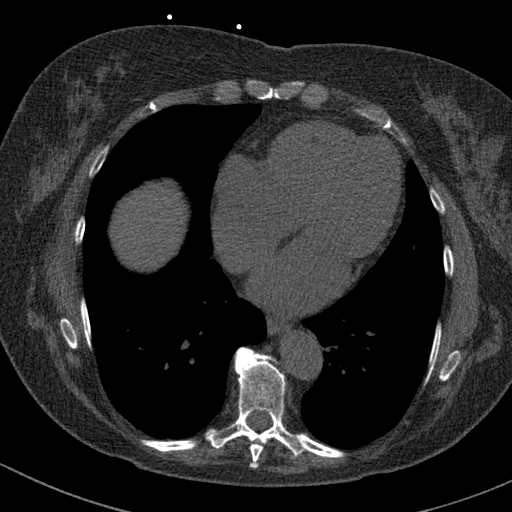
[im 34/68  vessel]
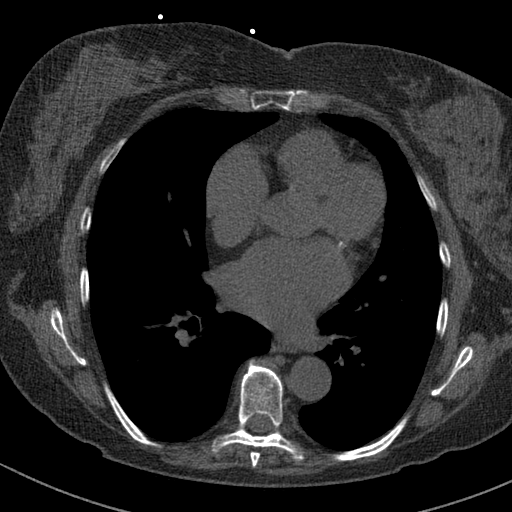
[im 45/68  vessel]
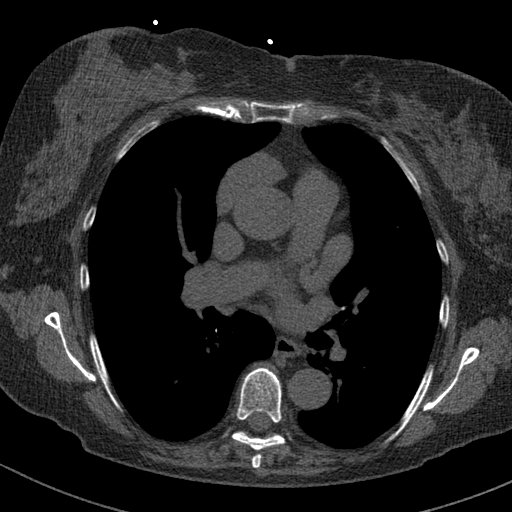
[im 56/68  vessel]
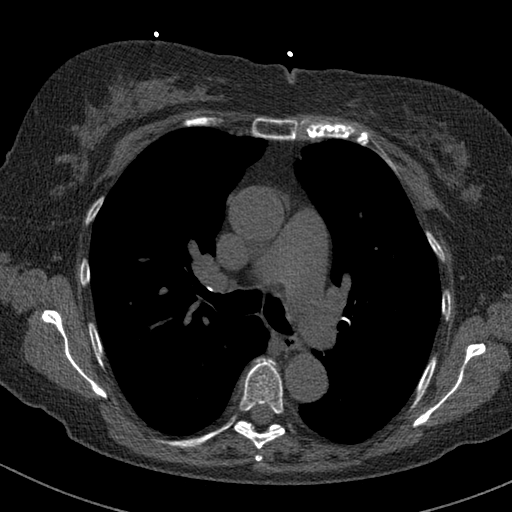
[im 56/68  lung]
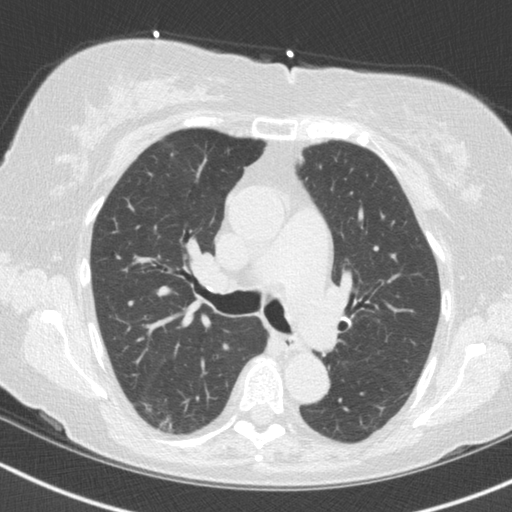

[Series 4: cascseq 2.0 br59 lung · axial · 0.62mm/px · z∈[-204,-116]mm · 5 of 68 slices shown]
[im 12/68  lung]
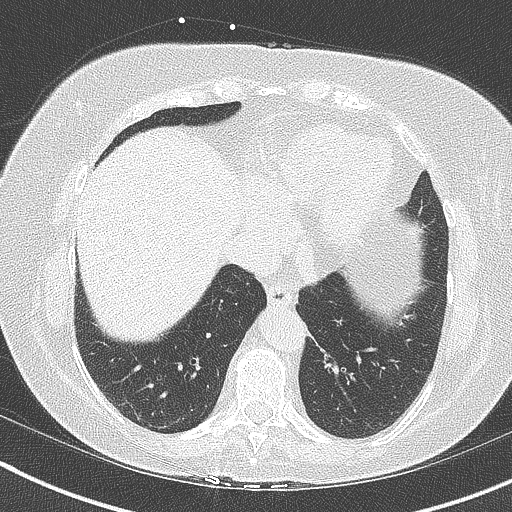
[im 23/68  lung]
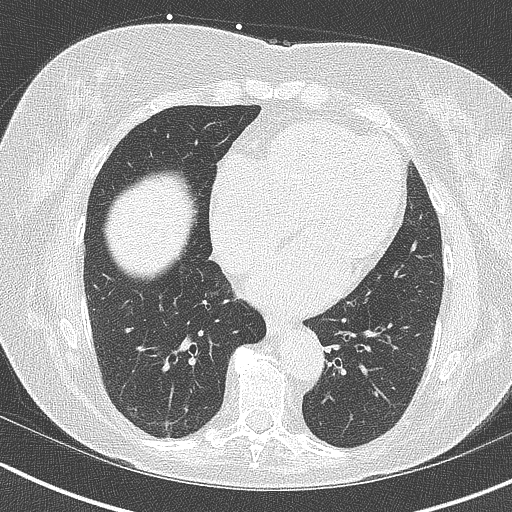
[im 34/68  lung]
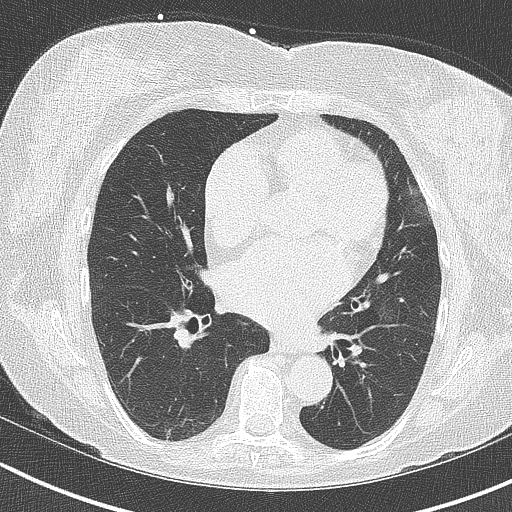
[im 45/68  lung]
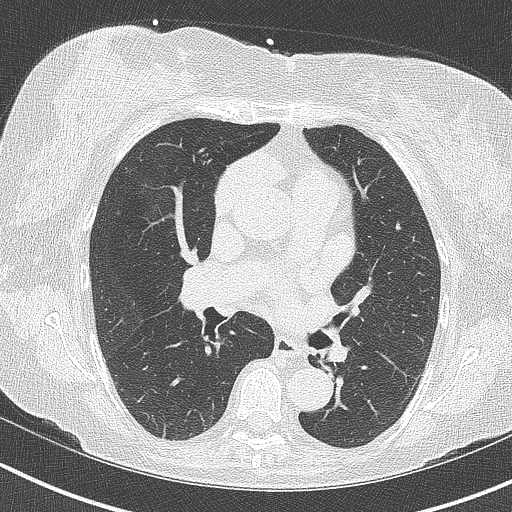
[im 56/68  lung]
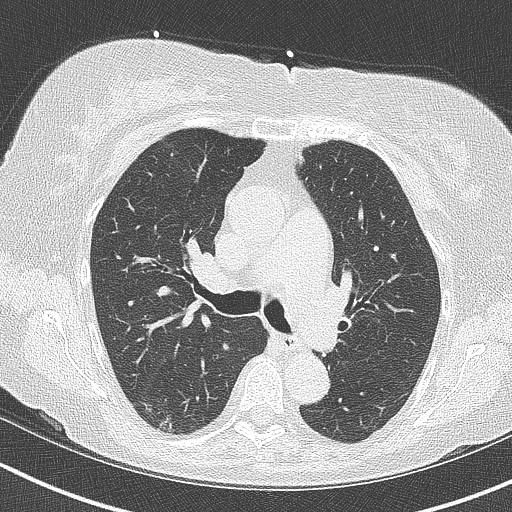

[14 of 20 positions shown; findings below may reference images not displayed]

FINDINGS: Vascular: Aortic and coronary artery calcifications are noted. No
aortic aneurysm.

Mediastinum/Nodes: The visualized mediastinal structures are
unremarkable. Small scattered mediastinal and hilar nodes but no
mass or overt adenopathy. The visualized esophagus is grossly
normal.

Lungs/Pleura: The lungs demonstrate mild emphysematous changes but
no acute pulmonary findings or worrisome pulmonary lesions.

Upper Abdomen: No significant upper abdominal findings.

Musculoskeletal: No significant bony findings.
IMPRESSION: 1. Mild emphysematous changes but no acute pulmonary findings or
worrisome pulmonary lesions.
2. Aortic and coronary artery calcifications.
3. No mediastinal or hilar mass or adenopathy.

Aortic Atherosclerosis (Q61WY-3KS.S) and Emphysema (Q61WY-LZK.I).

ADDENDUM:
Cardiovascular Disease Risk stratification

EXAM:
Coronary Calcium Score
FINDINGS: Coronary arteries: Normal origins.

Coronary Calcium Score:

Left main: 0

Left anterior descending artery: 303

Left circumflex artery:

Right coronary artery:

Total: 391

Percentile: 90

Pericardium: Normal.

Ascending Aorta: Normal caliber.

Non-cardiac: See separate report from [REDACTED].
IMPRESSION: Coronary calcium score of 391. This was 90 percentile for age-,
race-, and sex-matched controls.



If CAC=0, it is reasonable to withhold statin therapy and reassess
in 5 to 10 years, as long as higher risk conditions are absent
(diabetes mellitus, family history of premature CHD in first degree
relatives (males <55 years; females <65 years), cigarette smoking,
or LDL >=190 mg/dL).

If CAC is 1 to 99, it is reasonable to initiate statin therapy for
patients >=55 years of age.

If CAC is >=100 or >=75th percentile, it is reasonable to initiate
statin therapy at any age.

Cardiology referral should be considered for patients with CAC
scores >=400 or >=75th percentile.

*7725 AHA/ACC/AACVPR/AAPA/ABC/BENRABAH/LOCKLEAR/YEMI/[place]/HUMAYRA/MONTELEONE/KATIKU
Guideline on the Management of Blood Cholesterol: A Report of the
American College of Cardiology/American Heart Association Task Force
on Clinical Practice Guidelines. J Am Coll Cardiol.
0273;73(24):5206-5541.

Easyboy Tbos, DO

The noncardiac portion of this study will be interpreted in separate
report by the radiologist.

*** End of Addendum ***
EXAM:
OVER-READ INTERPRETATION  CT CHEST

The following report is an over-read performed by radiologist Dr. KOTOKU
Jumper [REDACTED] on 09/24/2021. This over-read
does not include interpretation of cardiac or coronary anatomy or
pathology. The coronary calcium score interpretation by the
cardiologist is attached.
FINDINGS: Vascular: Aortic and coronary artery calcifications are noted. No
aortic aneurysm.

Mediastinum/Nodes: The visualized mediastinal structures are
unremarkable. Small scattered mediastinal and hilar nodes but no
mass or overt adenopathy. The visualized esophagus is grossly
normal.

Lungs/Pleura: The lungs demonstrate mild emphysematous changes but
no acute pulmonary findings or worrisome pulmonary lesions.

Upper Abdomen: No significant upper abdominal findings.

Musculoskeletal: No significant bony findings.
IMPRESSION: 1. Mild emphysematous changes but no acute pulmonary findings or
worrisome pulmonary lesions.
2. Aortic and coronary artery calcifications.
3. No mediastinal or hilar mass or adenopathy.

Aortic Atherosclerosis (Q61WY-3KS.S) and Emphysema (Q61WY-LZK.I).

## 2023-03-02 ENCOUNTER — Ambulatory Visit: Payer: Medicare HMO | Attending: Internal Medicine

## 2023-03-02 DIAGNOSIS — R7989 Other specified abnormal findings of blood chemistry: Secondary | ICD-10-CM | POA: Diagnosis not present

## 2023-03-02 DIAGNOSIS — Z79899 Other long term (current) drug therapy: Secondary | ICD-10-CM | POA: Diagnosis not present

## 2023-03-02 DIAGNOSIS — E876 Hypokalemia: Secondary | ICD-10-CM | POA: Diagnosis not present

## 2023-03-04 ENCOUNTER — Telehealth: Payer: Self-pay

## 2023-03-04 NOTE — Telephone Encounter (Signed)
-----   Message from [place] sent at 03/03/2023  7:15 AM EDT ----- Electrolytes and kidney function are all OK/normal Fluid number is up  It has been about 1 year since she was seen in clinic    How is swelling    Should have follow up

## 2023-03-04 NOTE — Telephone Encounter (Signed)
I spoke with the pt re: her lab results and sh says she has been out of town a lot on vacation and she has had a lot of increased sodium..she says she has been eating things she does not normally eat with a lot of salt.   She says she has minimal lower extremity edema and no SOB... she says she is feeling well... she is now home and will be very cautious about her NA intake/ diet... I need to make her an OV... she needs a Tues or Wed only... I will keep her on my list and ghet her works in soon.   Se will monitor her breathing and the edema and if anything worsens she will call me ASAP.

## 2023-03-10 ENCOUNTER — Other Ambulatory Visit: Payer: Self-pay | Admitting: Internal Medicine

## 2023-03-10 DIAGNOSIS — J45909 Unspecified asthma, uncomplicated: Secondary | ICD-10-CM

## 2023-03-23 ENCOUNTER — Encounter: Payer: Self-pay | Admitting: Internal Medicine

## 2023-03-26 ENCOUNTER — Other Ambulatory Visit: Payer: Self-pay | Admitting: Pharmacist Clinician (PhC)/ Clinical Pharmacy Specialist

## 2023-03-26 MED ORDER — REPATHA SURECLICK 140 MG/ML ~~LOC~~ SOAJ: 1.0000 mL | SUBCUTANEOUS | 0 refills | Status: DC

## 2023-03-26 MED ORDER — FUROSEMIDE 40 MG PO TABS: 40.0000 mg | ORAL_TABLET | Freq: Every day | ORAL | 0 refills | Status: DC

## 2023-03-26 MED ORDER — POTASSIUM CHLORIDE CRYS ER 20 MEQ PO TBCR: 40.0000 meq | EXTENDED_RELEASE_TABLET | Freq: Every day | ORAL | 0 refills | Status: DC

## 2023-03-27 ENCOUNTER — Other Ambulatory Visit: Payer: Self-pay | Admitting: Family

## 2023-03-27 MED ORDER — FLUTICASONE FUROATE-VILANTEROL 100-25 MCG/ACT IN AEPB: 1.0000 | INHALATION_SPRAY | Freq: Every day | RESPIRATORY_TRACT | 1 refills | Status: DC

## 2023-03-30 NOTE — Telephone Encounter (Signed)
Offered pt an appt next Monday but she says it does not work for her... will keep her on my list for a Tuesday or Wednesday opening.

## 2023-04-01 NOTE — Telephone Encounter (Signed)
OV 04/13/23

## 2023-04-08 ENCOUNTER — Other Ambulatory Visit: Payer: Self-pay

## 2023-04-08 MED ORDER — POTASSIUM CHLORIDE CRYS ER 20 MEQ PO TBCR: 40.0000 meq | EXTENDED_RELEASE_TABLET | Freq: Every day | ORAL | 0 refills | Status: DC

## 2023-04-08 MED ORDER — FUROSEMIDE 40 MG PO TABS: 40.0000 mg | ORAL_TABLET | Freq: Every day | ORAL | 0 refills | Status: DC

## 2023-04-12 NOTE — Progress Notes (Unsigned)
Cardiology Office Note   Date:  04/13/2023   ID:  Penny Barnett, DOB 1951-03-17, MRN [id number]  PCP:  Madelin Headings, MD  Cardiologist:   Dietrich Pates, MD   F/U of paroxysmal atrial flutter, atrial fibrillaton  and HTN      History of Present Illness: Penny Barnett is a 72 y.o. female with a history of atrial flutter    S/p TEE/DCCV in 2016   Also a hx of CAD (Ca score in 2024 was 391) HTN, HL, asthma  Pt also with statin intolerance In Nov 2021 she had COVID   Hospitalized   Hyperthyroid at time, treated  methimazole.   Was in afib   Cardioverted     Monitor in Sept 2022 showed no recurrence   Eliquis stopped In July 2023 she  was back in afib  She noted a little less energy.   Placed on Eliquis   In Aug 2023 she underwent DC cardioversion.   The pt says that she felt like she went back into afib about 1 week after the cardioversion  Since that time she notes no change in her breathing, no change in her ability to do things, no real change in her energy  level She takes her pulse at home fairly frequently   HR 60s to low 100s mostly, not like when hyperthyroid.  I saw the pt in Sept 2023    Seen by PharmD  Placed on Repatha  Since seen the pt says in am when she gets up she will get a little nauseated  Denies SOB   No CP  No palpitations     Walks at work   Works 5 days 6 hours per day    [employer] 3 to 5 miles per day   Br  Skips   or eats watermelon in am Lunch   Hamburger patty or chicken breast    Not a big veggie eater     Dinner    May skip   Has nuts      Drinks:   Diet Rite and water   C berry juice   Drinks V8 every day    Allergies:   Statins and Penicillins   Past Medical History:  Diagnosis Date   Allergy    Anemia    Arthritis    hands   Asthma    as a child and then attack 1996  poss allergic to cat hair. ventilated x 2 . saw Dr [place] Callas then .    Atrial flutter (HCC)    a. 12/2014 Echo: EF 60-65%, no rwma, mild MR;  b. S/P TEE/DCCV;  c. CHA2DS2VASc =  3-->Eliquis.   Cataract    Had surgery to remove   GERD (gastroesophageal reflux disease)    2000 taking otc prevacid   evey day.    Hyperlipidemia    Hypertension     3 meds controlled   Kidney stone    passed stone - no surgery required    Past Surgical History:  Procedure Laterality Date   CARDIOVERSION N/A 01/03/2015   Procedure: CARDIOVERSION;  Surgeon: Pricilla Riffle, MD;  Location: [facility] ENDOSCOPY;  Service: Cardiovascular;  Laterality: N/A;   CARDIOVERSION N/A 08/06/2020   Procedure: CARDIOVERSION;  Surgeon: [name], MD;  Location: [facility] ENDOSCOPY;  Service: Cardiovascular;  Laterality: N/A;   CARDIOVERSION N/A 01/27/2022   Procedure: CARDIOVERSION;  Surgeon: Meriam Sprague, MD;  Location: [facility] ENDOSCOPY;  Service: Cardiovascular;  Laterality: N/A;  CATARACT EXTRACTION Bilateral 10/14/2010   both eyes   COLONOSCOPY  02/2011   [facility] SURGERY Bilateral    x 2 - frozen shoulder rt 2006, 2000 left    TEE WITHOUT CARDIOVERSION N/A 01/03/2015   Procedure: TRANSESOPHAGEAL ECHOCARDIOGRAM (TEE);  Surgeon: Pricilla Riffle, MD;  Location: [facility] ENDOSCOPY;  Service: Cardiovascular;  Laterality: N/A;   TONSILLECTOMY       Social History:  The patient  reports that she quit smoking about 38 years ago. Her smoking use included cigarettes. She started smoking about 58 years ago. She has a 20 pack-year smoking history. She has never used smokeless tobacco. She reports that she does not drink alcohol and does not use drugs.   Family History:  The patient's family history includes Alcohol abuse in her father; Bladder Cancer in her mother; Breast cancer in her maternal grandmother and mother; Uterine cancer in her sister.    ROS:  Please see the history of present illness. All other systems are reviewed and  Negative to the above problem except as noted.    PHYSICAL EXAM: VS:  BP 138/74 (BP Location: Right Arm, Patient Position: Sitting, Cuff Size: Normal)   Pulse 91   Resp 11    Wt 175 lb 3.2 oz (79.5 kg)   SpO2 98%   BMI 30.07 kg/m   GEN: Pt is  in no acute distress  HEENT: normal  Neck: JVP is normal   No carotid bruits Cardiac: Irreg irreg  No  murmurs   No LE  edema  Respiratory:  clear to auscultation  GI: soft, nontender,  No hepatomegaly    EKG:  EKG is ordered today.   Atrial fibrillation   83 bpm   Nonspecific ST changes       The following studies were reviewed today:  Echo  July 2023  1. Left ventricular ejection fraction, by estimation, is 60 to 65%. Left  ventricular ejection fraction by 3D volume is 63 %. The left ventricle has  normal function. The left ventricle has no regional wall motion  abnormalities. There is mild asymmetric  left ventricular hypertrophy of the basal-septal segment. Left ventricular  diastolic parameters are indeterminate. The average left ventricular  global longitudinal strain is -22.4 %.   2. Right ventricular systolic function is normal. The right ventricular  size is mildly enlarged. There is mildly elevated pulmonary artery  systolic pressure. The estimated right ventricular systolic pressure is  40.2 mmHg.   3. Left atrial size was severely dilated.   4. Right atrial size was mildly dilated.   5. The mitral valve is normal in structure. Mild mitral valve  regurgitation. No evidence of mitral stenosis.   6. Tricuspid valve regurgitation is moderate.   7. The aortic valve is tricuspid. Aortic valve regurgitation is trivial.   Stress test 11/2021 The study is normal. The study is low risk.   No ST deviation was noted.   Left ventricular function is normal. Nuclear stress EF: 52 %. The left ventricular ejection fraction is mildly decreased (45-54%). End diastolic cavity size is normal.   Prior study not available for comparison.   Monitor 02/2021 Patch Wear Time:  7 days and 14 hours (2022-08-20T10:39:16-0400 to 2022-08-28T00:54:20-0400)   Predominant rhythm:  Sinus rhythm   Overall heart rates 52 to 174  bpm   Average HR 67 bpm   Intermttent SVT, longest lasting 21.4 sec (130 bpm)   Rare PVCs,   Occasional PVCs  No diary entries/triggered events      Echo 05/2020 1. Left ventricular ejection fraction, by estimation, is 60 to 65%. The  left ventricle has normal function. Left ventricular diastolic function  could not be evaluated.   2. Right ventricular systolic function is normal. The right ventricular  size is normal. There is normal pulmonary artery systolic pressure.   3. The mitral valve is grossly normal. Mild mitral valve regurgitation.   4. The aortic valve is normal in structure. Aortic valve regurgitation is  not visualized. No aortic stenosis is present.  Lipid Panel    Component Value Date/Time   CHOL 177 07/22/2022 1036   CHOL 223 (H) 07/29/2020 1208   TRIG 143.0 07/22/2022 1036   HDL 59.80 07/22/2022 1036   HDL 58 07/29/2020 1208   CHOLHDL 3 07/22/2022 1036   VLDL 28.6 07/22/2022 1036   LDLCALC 89 07/22/2022 1036   LDLCALC 133 (H) 07/29/2020 1208   LDLCALC 170 (H) 01/23/2020 1022   LDLDIRECT 129.0 11/28/2018 1048      Wt Readings from Last 3 Encounters:  04/13/23 175 lb 3.2 oz (79.5 kg)  11/03/22 176 lb (79.8 kg)  09/25/22 180 lb (81.6 kg)      ASSESSMENT AND PLAN:  1  Atrial fibrillation   Pt with permanent atrial fibrillation   Tolerating with rate control   Echo in June 2023  LVEF normal  Atrial large REcomm    Rate control and Eliquis    Check labs   2  CAD  Ca score in 2024 391     PT remains asymptoamtic    Follow   Risk factor modify  3  HL Pt intolerant to statins  Now on Repatha  WIll check lipomed    4  HTN  Follow  BP 110s to low 130s     5   DIet  Watch carbs      Continue activity as doing   Follow up end of Aug 2024  Signed, Dietrich Pates, MD  04/13/2023 1:09 PM    [facility] [address] [place], [place], [place]  [address] Phone: [phone]; Fax: [phone]

## 2023-04-13 ENCOUNTER — Encounter: Payer: Self-pay | Admitting: Internal Medicine

## 2023-04-13 ENCOUNTER — Ambulatory Visit: Payer: Medicare HMO | Attending: Internal Medicine | Admitting: Internal Medicine

## 2023-04-13 VITALS — BP 138/74 | HR 91 | Resp 11 | Wt 175.2 lb

## 2023-04-13 DIAGNOSIS — E876 Hypokalemia: Secondary | ICD-10-CM | POA: Diagnosis not present

## 2023-04-13 DIAGNOSIS — E785 Hyperlipidemia, unspecified: Secondary | ICD-10-CM

## 2023-04-13 DIAGNOSIS — R7989 Other specified abnormal findings of blood chemistry: Secondary | ICD-10-CM | POA: Diagnosis not present

## 2023-04-13 DIAGNOSIS — I10 Essential (primary) hypertension: Secondary | ICD-10-CM

## 2023-04-13 DIAGNOSIS — I480 Paroxysmal atrial fibrillation: Secondary | ICD-10-CM | POA: Diagnosis not present

## 2023-04-13 DIAGNOSIS — Z79899 Other long term (current) drug therapy: Secondary | ICD-10-CM

## 2023-04-13 NOTE — Patient Instructions (Signed)
Medication Instructions:   *If you need a refill on your cardiac medications before your next appointment, please call your pharmacy*   Lab Work: CMET, CBC, PRO BNP, NMR  If you have labs (blood work) drawn today and your tests are completely normal, you will receive your results only by: MyChart Message (if you have MyChart) OR A paper copy in the mail If you have any lab test that is abnormal or we need to change your treatment, we will call you to review the results.   Testing/Procedures:    Follow-Up: At [facility], you and your health needs are our priority.  As part of our continuing mission to provide you with exceptional heart care, we have created designated Provider Care Teams.  These Care Teams include your primary Cardiologist (physician) and Advanced Practice Providers (APPs -  Physician Assistants and Nurse Practitioners) who all work together to provide you with the care you need, when you need it.  We recommend signing up for the patient portal called "MyChart".  Sign up information is provided on this After Visit Summary.  MyChart is used to connect with patients for Virtual Visits (Telemedicine).  Patients are able to view lab/test results, encounter notes, upcoming appointments, etc.  Non-urgent messages can be sent to your provider as well.   To learn more about what you can do with MyChart, go to [email or web address].    Your next appointment:   8 month(s)  Provider:   Dietrich Pates, MD     Other Instructions

## 2023-04-14 LAB — CBC
Hematocrit: 39.2 % (ref 34.0–46.6)
Hemoglobin: 12.9 g/dL (ref 11.1–15.9)
MCH: 29.2 pg (ref 26.6–33.0)
MCHC: 32.9 g/dL (ref 31.5–35.7)
MCV: 89 fL (ref 79–97)
Platelets: 219 10*3/uL (ref 150–450)
RBC: 4.42 x10E6/uL (ref 3.77–5.28)
RDW: 13.8 % (ref 11.7–15.4)
WBC: 5.4 10*3/uL (ref 3.4–10.8)

## 2023-04-14 LAB — COMPREHENSIVE METABOLIC PANEL
ALT: 29 [IU]/L (ref 0–32)
AST: 24 [IU]/L (ref 0–40)
Albumin: 4.4 g/dL (ref 3.8–4.8)
Alkaline Phosphatase: 107 [IU]/L (ref 44–121)
BUN/Creatinine Ratio: 32 — ABNORMAL HIGH (ref 12–28)
BUN: 21 mg/dL (ref 8–27)
Bilirubin Total: 0.4 mg/dL (ref 0.0–1.2)
CO2: 27 mmol/L (ref 20–29)
Calcium: 9.6 mg/dL (ref 8.7–10.3)
Chloride: 96 mmol/L (ref 96–106)
Creatinine, Ser: 0.66 mg/dL (ref 0.57–1.00)
Globulin, Total: 2.4 g/dL (ref 1.5–4.5)
Glucose: 87 mg/dL (ref 70–99)
Potassium: 3.3 mmol/L — ABNORMAL LOW (ref 3.5–5.2)
Sodium: 141 mmol/L (ref 134–144)
Total Protein: 6.8 g/dL (ref 6.0–8.5)
eGFR: 92 mL/min/{1.73_m2} (ref >59)

## 2023-04-14 LAB — NMR, LIPOPROFILE
Cholesterol, Total: 270 mg/dL — ABNORMAL HIGH (ref 100–199)
HDL Particle Number: 38.2 umol/L (ref >=30.5)
HDL-C: 53 mg/dL (ref >39)
LDL Particle Number: 1920 nmol/L — ABNORMAL HIGH (ref <1000)
LDL Size: 19.9 nm — ABNORMAL LOW (ref >20.5)
LDL-C (NIH Calc): 160 mg/dL — ABNORMAL HIGH (ref 0–99)
LP-IR Score: 81 — ABNORMAL HIGH (ref <=45)
Small LDL Particle Number: 1076 nmol/L — ABNORMAL HIGH (ref <=527)
Triglycerides: 303 mg/dL — ABNORMAL HIGH (ref 0–149)

## 2023-04-14 LAB — PRO B NATRIURETIC PEPTIDE: NT-Pro BNP: 1506 pg/mL — ABNORMAL HIGH (ref 0–301)

## 2023-04-21 ENCOUNTER — Telehealth: Payer: Self-pay

## 2023-04-21 NOTE — Telephone Encounter (Signed)
-----   Message from Nurse Carlyle C sent at 04/21/2023  7:43 AM EST -----  ----- Message ----- From: Pricilla Riffle, MD Sent: 04/20/2023   9:12 PM EST To: Cv Div Ch St Triage  CBC is normal Potassium is a little low  Confirm that she is taking 2 potassium Fluid number is up  This may be affected by afib (permanent afib)   WHen I saw her in clinc her volume status looked OK Lipids are very high   Is she taking REpatha as directed   Also triglycerides are very high   Limit carbs Please confirm meds wit patient

## 2023-04-21 NOTE — Telephone Encounter (Signed)
My Chart sent to the pt.. will call and follow up with her.

## 2023-04-23 NOTE — Telephone Encounter (Signed)
Left a message for the pt to call back.  

## 2023-04-27 ENCOUNTER — Other Ambulatory Visit: Payer: Self-pay | Admitting: Internal Medicine

## 2023-04-28 NOTE — Telephone Encounter (Signed)
Left another message for the pt to call back.  

## 2023-04-28 NOTE — Telephone Encounter (Signed)
Pt says she has only been taking one K but 2 when she thinks of it.. she will keep a note pad reminding herself to take the 2 tabs daily.. I advised her that her K was too low.   She also says that she is almost out of her Repatha and she prefers not to get anymore prior to the new year... she says she would like to think about it and let us know if she plans to continue using it. We went over the benefits.   We also talked about what to look for with fluid overload and pt will let us know if she develops any increasing SOB or edema.. she say she does not have any at this time.   I will forward to Dr Tenny Craw for her review.

## 2023-05-24 ENCOUNTER — Encounter: Payer: Self-pay | Admitting: Internal Medicine

## 2023-06-06 ENCOUNTER — Other Ambulatory Visit: Payer: Self-pay | Admitting: Internal Medicine

## 2023-06-25 ENCOUNTER — Other Ambulatory Visit: Payer: Self-pay | Admitting: Family

## 2023-06-28 ENCOUNTER — Other Ambulatory Visit: Payer: Self-pay | Admitting: Family

## 2023-06-28 ENCOUNTER — Encounter: Payer: Self-pay | Admitting: Internal Medicine

## 2023-06-28 MED ORDER — FLUTICASONE FUROATE-VILANTEROL 100-25 MCG/ACT IN AEPB: 1.0000 | INHALATION_SPRAY | Freq: Every day | RESPIRATORY_TRACT | 1 refills | Status: DC

## 2023-07-28 ENCOUNTER — Encounter: Payer: Medicare HMO | Admitting: Internal Medicine

## 2023-08-19 ENCOUNTER — Ambulatory Visit (INDEPENDENT_AMBULATORY_CARE_PROVIDER_SITE_OTHER): Admitting: Internal Medicine

## 2023-08-19 ENCOUNTER — Encounter: Payer: Self-pay | Admitting: Internal Medicine

## 2023-08-19 VITALS — BP 120/84 | HR 69 | Temp 97.6°F | Wt 175.6 lb

## 2023-08-19 DIAGNOSIS — B002 Herpesviral gingivostomatitis and pharyngotonsillitis: Secondary | ICD-10-CM | POA: Diagnosis not present

## 2023-08-19 MED ORDER — VALACYCLOVIR HCL 1 G PO TABS: 2000.0000 mg | ORAL_TABLET | Freq: Two times a day (BID) | ORAL | 3 refills | Status: AC

## 2023-08-19 NOTE — Progress Notes (Signed)
 Established Patient Office Visit     CC/Reason for Visit: Rash on mouth  HPI: Penny Barnett is a 73 y.o. female who is coming in today for the above mentioned reasons.  She has been having a rash around her mouth now for about 2 weeks.  She has tried Benadryl, Vaseline without much relief.  She has never had this before.   Past Medical/Surgical History: Past Medical History:  Diagnosis Date   Allergy    Anemia    Arthritis    hands   Asthma    as a child and then attack 1996  poss allergic to cat hair. ventilated x 2 . saw Dr [place] Callas then .    Atrial flutter (HCC)    a. 12/2014 Echo: EF 60-65%, no rwma, mild MR;  b. S/P TEE/DCCV;  c. CHA2DS2VASc = 3-->Eliquis.   Cataract    Had surgery to remove   GERD (gastroesophageal reflux disease)    2000 taking otc prevacid   evey day.    Hyperlipidemia    Hypertension     3 meds controlled   Kidney stone    passed stone - no surgery required    Past Surgical History:  Procedure Laterality Date   CARDIOVERSION N/A 01/03/2015   Procedure: CARDIOVERSION;  Surgeon: Pricilla Riffle, MD;  Location: [facility] ENDOSCOPY;  Service: Cardiovascular;  Laterality: N/A;   CARDIOVERSION N/A 08/06/2020   Procedure: CARDIOVERSION;  Surgeon: [name], MD;  Location: [facility] ENDOSCOPY;  Service: Cardiovascular;  Laterality: N/A;   CARDIOVERSION N/A 01/27/2022   Procedure: CARDIOVERSION;  Surgeon: Meriam Sprague, MD;  Location: [facility] ENDOSCOPY;  Service: Cardiovascular;  Laterality: N/A;   CATARACT EXTRACTION Bilateral 10/14/2010   both eyes   COLONOSCOPY  02/2011   [facility] SURGERY Bilateral    x 2 - frozen shoulder rt 2006, 2000 left    TEE WITHOUT CARDIOVERSION N/A 01/03/2015   Procedure: TRANSESOPHAGEAL ECHOCARDIOGRAM (TEE);  Surgeon: Pricilla Riffle, MD;  Location: [facility] ENDOSCOPY;  Service: Cardiovascular;  Laterality: N/A;   TONSILLECTOMY      Social History:  reports that she quit smoking about 39 years ago. Her smoking use  included cigarettes. She started smoking about 59 years ago. She has a 20 pack-year smoking history. She has never used smokeless tobacco. She reports that she does not drink alcohol and does not use drugs.  Allergies: Allergies  Allergen Reactions   Statins     Joint and muscle pain   Penicillins Other (See Comments)    From childhood- reaction not recalled    Family History:  Family History  Problem Relation Age of Onset   Breast cancer Mother    Bladder Cancer Mother    Uterine cancer Sister    Alcohol abuse Father    Breast cancer Maternal Grandmother    Colon cancer Neg Hx    Heart attack Neg Hx    Stroke Neg Hx    Hypertension Neg Hx    Esophageal cancer Neg Hx    Rectal cancer Neg Hx    Stomach cancer Neg Hx      Current Outpatient Medications:    acetaminophen (TYLENOL) 325 MG tablet, Take 325-650 mg by mouth at bedtime as needed for moderate pain., Disp: , Rfl:    apixaban (ELIQUIS) 5 MG TABS tablet, Take 1 tablet (5 mg total) by mouth 2 (two) times daily., Disp: 180 tablet, Rfl: 3   bisoprolol (ZEBETA) 5 MG tablet, TAKE  1 TABLET EVERY DAY, Disp: 90 tablet, Rfl: 3   Cholecalciferol (VITAMIN D-3) 125 MCG (5000 UT) TABS, Take 5,000 Units by mouth daily., Disp: , Rfl:    Collagen-Boron-Hyaluronic Acid (MOVE FREE ULTRA JOINT HEALTH PO), Take 1 tablet by mouth daily., Disp: , Rfl:    diclofenac Sodium (VOLTAREN) 1 % GEL, Apply 2 g topically daily as needed (Pain)., Disp: , Rfl:    diphenhydramine-acetaminophen (TYLENOL PM) 25-500 MG TABS tablet, Take 1 tablet by mouth at bedtime as needed (sleep)., Disp: , Rfl:    Evolocumab (REPATHA SURECLICK) 140 MG/ML SOAJ, Inject 140 mg into the skin every 14 (fourteen) days. Need MD appointment for further refills, Disp: 6 mL, Rfl: 0   fluticasone furoate-vilanterol (BREO ELLIPTA) 100-25 MCG/ACT AEPB, Inhale 1 puff into the lungs daily., Disp: 90 each, Rfl: 1   furosemide (LASIX) 40 MG tablet, TAKE 1 TABLET EVERY DAY (NEED MD  APPOINTMENT), Disp: 90 tablet, Rfl: 2   montelukast (SINGULAIR) 10 MG tablet, TAKE 1 TABLET EVERY MORNING, Disp: 90 tablet, Rfl: 3   Multiple Vitamins-Iron (MULTIVITAMIN/IRON PO), Take 1 tablet by mouth daily., Disp: , Rfl:    Multiple Vitamins-Minerals (LUTEIN-ZEAXANTHIN PO), Take 1 tablet by mouth daily., Disp: , Rfl:    omeprazole (PRILOSEC) 20 MG capsule, Take 20 mg by mouth daily., Disp: , Rfl:    potassium chloride SA (KLOR-CON M) 20 MEQ tablet, TAKE 2 TABLETS (40 MEQ) BY MOUTH DAILY, Disp: 180 tablet, Rfl: 2   Tetrahydrozoline HCl (VISINE OP), Place 1 drop into both eyes daily as needed (redness)., Disp: , Rfl:    triamterene-hydrochlorothiazide (MAXZIDE-25) 37.5-25 MG tablet, TAKE 1/2 TABLET EVERY DAY, Disp: 45 tablet, Rfl: 2   valACYclovir (VALTREX) 1000 MG tablet, Take 2 tablets (2,000 mg total) by mouth 2 (two) times daily for 1 day., Disp: 4 tablet, Rfl: 3   vitamin B-12 (CYANOCOBALAMIN) 500 MCG tablet, Take 500 mcg by mouth daily., Disp: , Rfl:   Review of Systems:  Negative unless indicated in HPI.   Physical Exam: Vitals:   08/19/23 1104  BP: 120/84  Pulse: 69  Temp: 97.6 F (36.4 C)  TempSrc: Oral  SpO2: 93%  Weight: 175 lb 9.6 oz (79.7 kg)    Body mass index is 30.14 kg/m.   Physical Exam HENT:     Mouth/Throat:      Comments: Classic appearance of herpes labialis.     Impression and Plan:  Oral herpes simplex infection -     valACYclovir HCl; Take 2 tablets (2,000 mg total) by mouth 2 (two) times daily for 1 day.  Dispense: 4 tablet; Refill: 3  -This appears to be oral herpes simplex.  Will prescribe Valtrex to take 2000 mg 2 times a day for 1 day and a few refills if she has further outbreaks.   Time spent:21 minutes reviewing chart, interviewing and examining patient and formulating plan of care.     Chaya Jan, MD [facility]

## 2023-08-30 ENCOUNTER — Ambulatory Visit: Payer: Self-pay | Admitting: Internal Medicine

## 2023-08-30 NOTE — Telephone Encounter (Signed)
  Chief Complaint: sore lips Symptoms: severe chapped lips Frequency: 2-3 days Pertinent Negatives: Patient denies fever, drainage from lips, swelling, air compromise Disposition: [] ED /[] Urgent Care (no appt availability in office) / [] Appointment(In office/virtual)/ []  [facility]/ [] Home Care/ [] Refused Recommended Disposition /[] [facility]/ [x]  Follow-up with PCP Additional Notes: Pt states she was recently seen for "oral herpes". Pt was given 2 pills, took as prescribed, resolved. Pt states about 1.5 weeks she was s/s free but it has returned to the initial status.  Pt denies any airway compromise. Pt states that she has not utilized any new cosmetic products. Pt states that she opened a new carmex and has been using that and other OTC topicals for the lips. Pt states she would like the pcp to write for more medications like she had initially or write for a new medication. Pt advised that PCP will be made aware.    Copied from CRM [id number]. Topic: General - Call Back - No Documentation >> Aug 30, 2023 11:55 AM Alcus Dad wrote: Reason for CRM: Patient missed call and was calling back Reason for Disposition  [1] Chapped lips AND [2] no improvement using Care Advice  Answer Assessment - Initial Assessment Questions 1. SYMPTOM: "What's the main symptom you're concerned about?" (e.g., chapped lips, dry mouth, lump, sores)     sore 2. ONSET: "When did the  sores  start?"     Started in the last 2-3 days 3. PAIN: "Is there any pain?" If Yes, ask: "How bad is it?" (Scale: 1-10; mild, moderate, severe)   - MILD (1-3):  doesn't interfere with eating or normal activities   - MODERATE (4-7): interferes with eating some solids and normal activities   - SEVERE (8-10):  excruciating pain, interferes with most normal activities   - SEVERE DYSPHAGIA: can't swallow liquids, drooling     3 4. CAUSE: "What do you think is causing the symptoms?"     Unsure, chapped but worse 5. OTHER  SYMPTOMS: "Do you have any other symptoms?" (e.g., fever, sore throat, toothache, swelling)     denies  Protocols used: Mouth Symptoms-A-AH

## 2023-08-30 NOTE — Telephone Encounter (Signed)
 Copied From CRM [id number]. Reason for Triage: rash on mouth ,prescribed medication, took two doses,rash has came back    Left a message for patient to call back.

## 2023-08-31 NOTE — Telephone Encounter (Signed)
 Can give a good plan without a a visit  but will refer question  to dr H who saw her and ask for her advice

## 2023-09-01 ENCOUNTER — Other Ambulatory Visit: Payer: Self-pay | Admitting: Internal Medicine

## 2023-09-01 DIAGNOSIS — B002 Herpesviral gingivostomatitis and pharyngotonsillitis: Secondary | ICD-10-CM

## 2023-09-01 MED ORDER — VALACYCLOVIR HCL 1 G PO TABS: 2000.0000 mg | ORAL_TABLET | Freq: Two times a day (BID) | ORAL | 2 refills | Status: AC

## 2023-09-01 NOTE — Telephone Encounter (Signed)
 Spoke to pt and inform her Dr. Ardyth Harps sent in valacyclovir with refills to her pharmacy. Pt is aware. Pt states she is better and will pick it up if needs it.

## 2023-09-08 NOTE — Progress Notes (Unsigned)
 No chief complaint on file.   HPI: Patient  Penny Barnett  73 y.o. comes in today for Preventive Health Care visit  CV Resp   Health Maintenance  Topic Date Due   COVID-19 Vaccine (1) Never done   Zoster Vaccines- Shingrix (1 of 2) Never done   INFLUENZA VACCINE  01/14/2023   Medicare Annual Wellness (AWV)  08/31/2023   MAMMOGRAM  01/27/2024   Colonoscopy  04/27/2026   Pneumonia Vaccine 39+ Years old  Completed   DEXA SCAN  Completed   Hepatitis C Screening  Completed   HPV VACCINES  Aged Out   DTaP/Tdap/Td  Discontinued   Health Maintenance Review LIFESTYLE:  Exercise:   Tobacco/ETS: Alcohol:  Sugar beverages: Sleep: Drug use: no HH of    ROS:  GEN/ HEENT: No fever, significant weight changes sweats headaches vision problems hearing changes, CV/ PULM; No chest pain shortness of breath cough, syncope,edema  change in exercise tolerance. GI /GU: No adominal pain, vomiting, change in bowel habits. No blood in the stool. No significant GU symptoms. SKIN/HEME: ,no acute skin rashes suspicious lesions or bleeding. No lymphadenopathy, nodules, masses.  NEURO/ PSYCH:  No neurologic signs such as weakness numbness. No depression anxiety. IMM/ Allergy: No unusual infections.  Allergy .   REST of 12 system review negative except as per HPI   Past Medical History:  Diagnosis Date   Allergy    Anemia    Arthritis    hands   Asthma    as a child and then attack 1996  poss allergic to cat hair. ventilated x 2 . saw Dr Atlantis Callas then .    Atrial flutter (HCC)    a. 12/2014 Echo: EF 60-65%, no rwma, mild MR;  b. S/P TEE/DCCV;  c. CHA2DS2VASc = 3-->Eliquis.   Cataract    Had surgery to remove   GERD (gastroesophageal reflux disease)    2000 taking otc prevacid   evey day.    Hyperlipidemia    Hypertension     3 meds controlled   Kidney stone    passed stone - no surgery required    Past Surgical History:  Procedure Laterality Date   CARDIOVERSION N/A 01/03/2015    Procedure: CARDIOVERSION;  Surgeon: Pricilla Riffle, MD;  Location: [facility] ENDOSCOPY;  Service: Cardiovascular;  Laterality: N/A;   CARDIOVERSION N/A 08/06/2020   Procedure: CARDIOVERSION;  Surgeon: [name], MD;  Location[facility] ENDOSCOPY;  Service: Cardiovascular;  Laterality: N/A;   CARDIOVERSION N/A 01/27/2022   Procedure: CARDIOVERSION;  Surgeon: Meriam Sprague, MD;  Location: [facility] ENDOSCOPY;  Service: Cardiovascular;  Laterality: N/A;   CATARACT EXTRACTION Bilateral 10/14/2010   both eyes   COLONOSCOPY  02/2011   [facility] SURGERY Bilateral    x 2 - frozen shoulder rt 2006, 2000 left    TEE WITHOUT CARDIOVERSION N/A 01/03/2015   Procedure: TRANSESOPHAGEAL ECHOCARDIOGRAM (TEE);  Surgeon: Pricilla Riffle, MD;  Location: [facility] ENDOSCOPY;  Service: Cardiovascular;  Laterality: N/A;   TONSILLECTOMY      Family History  Problem Relation Age of Onset   Breast cancer Mother    Bladder Cancer Mother    Uterine cancer Sister    Alcohol abuse Father    Breast cancer Maternal Grandmother    Colon cancer Neg Hx    Heart attack Neg Hx    Stroke Neg Hx    Hypertension Neg Hx    Esophageal cancer Neg Hx    Rectal cancer Neg  Hx    Stomach cancer Neg Hx     Social History   Socioeconomic History   Marital status: Single    Spouse name: Not on file   Number of children: Not on file   Years of education: Not on file   Highest education level: GED or equivalent  Occupational History   Occupation: CSR  Tobacco Use   Smoking status: Former    Current packs/day: 0.00    Average packs/day: 1 pack/day for 20.0 years (20.0 ttl pk-yrs)    Types: Cigarettes    Start date: 06/21/1964    Quit date: 06/21/1984    Years since quitting: 39.2   Smokeless tobacco: Never  Vaping Use   Vaping status: Never Used  Substance and Sexual Activity   Alcohol use: No    Alcohol/week: 0.0 standard drinks of alcohol   Drug use: No   Sexual activity: Not on file  Other Topics Concern   Not on file   Social History Narrative   [place]      In [place] for 30 years  Has family here   Works 45 per week [employer]    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    HS education  single   6 hours of sleep   Hh  Of 1  Pet dog and a horse.    Remote tobacco  1-2 ppd stopped 1988    Etoh couple times a month rare    ocass  Diet soda.             Social Drivers of Corporate investment banker Strain: Low Risk  (08/18/2023)   Overall Financial Resource Strain (CARDIA)    Difficulty of Paying Living Expenses: Not very hard  Food Insecurity: No Food Insecurity (08/18/2023)   Hunger Vital Sign    Worried About Running Out of Food in the Last Year: Never true    Ran Out of Food in the Last Year: Never true  Transportation Needs: No Transportation Needs (08/18/2023)   PRAPARE - Administrator, Civil Service (Medical): No    Lack of Transportation (Non-Medical): No  Physical Activity: Sufficiently Active (08/18/2023)   Exercise Vital Sign    Days of Exercise per Week: 5 days    Minutes of Exercise per Session: 60 min  Stress: No Stress Concern Present (08/18/2023)   [employer] of Occupational Health - Occupational Stress Questionnaire    Feeling of Stress : Only a little  Social Connections: Moderately Isolated (08/18/2023)   Social Connection and Isolation Panel [NHANES]    Frequency of Communication with Friends and Family: Three times a week    Frequency of Social Gatherings with Friends and Family: Once a week    Attends Religious Services: 1 to 4 times per year    Active Member of [employer] or Organizations: No    Attends Engineer, structural: Not on file    Marital Status: Never married    Outpatient Medications Prior to Visit  Medication Sig Dispense Refill   acetaminophen (TYLENOL) 325 MG tablet Take 325-650 mg by mouth at bedtime as needed for moderate pain.     apixaban (ELIQUIS) 5 MG TABS tablet Take 1 tablet (5 mg total) by mouth 2 (two) times  daily. 180 tablet 3   bisoprolol (ZEBETA) 5 MG tablet TAKE 1 TABLET EVERY DAY 90 tablet 3   Cholecalciferol (VITAMIN D-3) 125 MCG (5000 UT) TABS  Take 5,000 Units by mouth daily.     Collagen-Boron-Hyaluronic Acid (MOVE FREE ULTRA JOINT HEALTH PO) Take 1 tablet by mouth daily.     diclofenac Sodium (VOLTAREN) 1 % GEL Apply 2 g topically daily as needed (Pain).     diphenhydramine-acetaminophen (TYLENOL PM) 25-500 MG TABS tablet Take 1 tablet by mouth at bedtime as needed (sleep).     Evolocumab (REPATHA SURECLICK) 140 MG/ML SOAJ Inject 140 mg into the skin every 14 (fourteen) days. Need MD appointment for further refills 6 mL 0   fluticasone furoate-vilanterol (BREO ELLIPTA) 100-25 MCG/ACT AEPB Inhale 1 puff into the lungs daily. 90 each 1   furosemide (LASIX) 40 MG tablet TAKE 1 TABLET EVERY DAY (NEED MD APPOINTMENT) 90 tablet 2   montelukast (SINGULAIR) 10 MG tablet TAKE 1 TABLET EVERY MORNING 90 tablet 3   Multiple Vitamins-Iron (MULTIVITAMIN/IRON PO) Take 1 tablet by mouth daily.     Multiple Vitamins-Minerals (LUTEIN-ZEAXANTHIN PO) Take 1 tablet by mouth daily.     omeprazole (PRILOSEC) 20 MG capsule Take 20 mg by mouth daily.     potassium chloride SA (KLOR-CON M) 20 MEQ tablet TAKE 2 TABLETS (40 MEQ) BY MOUTH DAILY 180 tablet 2   Tetrahydrozoline HCl (VISINE OP) Place 1 drop into both eyes daily as needed (redness).     triamterene-hydrochlorothiazide (MAXZIDE-25) 37.5-25 MG tablet TAKE 1/2 TABLET EVERY DAY 45 tablet 2   vitamin B-12 (CYANOCOBALAMIN) 500 MCG tablet Take 500 mcg by mouth daily.     No facility-administered medications prior to visit.     EXAM:  There were no vitals taken for this visit.  There is no height or weight on file to calculate BMI. Wt Readings from Last 3 Encounters:  08/19/23 175 lb 9.6 oz (79.7 kg)  04/13/23 175 lb 3.2 oz (79.5 kg)  11/03/22 176 lb (79.8 kg)    Physical Exam: Vital signs reviewed NGE:XBMW is a well-developed well-nourished alert  cooperative    who appearsr stated age in no acute distress.  HEENT: normocephalic atraumatic , Eyes: PERRL EOM's full, conjunctiva clear, Nares: paten,t no deformity discharge or tenderness., Ears: no deformity EAC's clear TMs with normal landmarks. Mouth: clear OP, no lesions, edema.  Moist mucous membranes. Dentition in adequate repair. NECK: supple without masses, thyromegaly or bruits. CHEST/PULM:  Clear to auscultation and percussion breath sounds equal no wheeze , rales or rhonchi. No chest wall deformities or tenderness. Breast: normal by inspection . No dimpling, discharge, masses, tenderness or discharge . CV: PMI is nondisplaced, S1 S2 no gallops, murmurs, rubs. Peripheral pulses are full without delay.No JVD .  ABDOMEN: Bowel sounds normal nontender  No guard or rebound, no hepato splenomegal no CVA tenderness.   Extremtities:  No clubbing cyanosis or edema, no acute joint swelling or redness no focal atrophy NEURO:  Oriented x3, cranial nerves 3-12 appear to be intact, no obvious focal weakness,gait within normal limits no abnormal reflexes or asymmetrical SKIN: No acute rashes normal turgor, color, no bruising or petechiae. PSYCH: Oriented, good eye contact, no obvious depression anxiety, cognition and judgment appear normal. LN: no cervical axillary  adenopathy  Lab Results  Component Value Date   WBC 5.4 04/13/2023   HGB 12.9 04/13/2023   HCT 39.2 04/13/2023   PLT 219 04/13/2023   GLUCOSE 87 04/13/2023   CHOL 177 07/22/2022   TRIG 143.0 07/22/2022   HDL 59.80 07/22/2022   LDLDIRECT 129.0 11/28/2018   LDLCALC 89 07/22/2022   ALT 29 04/13/2023  AST 24 04/13/2023   NA 141 04/13/2023   K 3.3 (L) 04/13/2023   CL 96 04/13/2023   CREATININE 0.66 04/13/2023   BUN 21 04/13/2023   CO2 27 04/13/2023   TSH 0.94 07/22/2022   HGBA1C 5.8 07/22/2022    BP Readings from Last 3 Encounters:  08/19/23 120/84  04/13/23 138/74  11/03/22 112/80    Labplan  reviewed with patient    ASSESSMENT AND PLAN:  Discussed the following assessment and plan:    ICD-10-CM   1. Visit for preventive health examination  Z00.00     2. Persistent atrial fibrillation (HCC)  I48.19     3. Hyperlipidemia, unspecified hyperlipidemia type  E78.5      No follow-ups on file.  Patient Care Team: Manna Gose, Neta Mends, MD as PCP - General (Internal Medicine) Pricilla Riffle, MD as PCP - Cardiology (Cardiology) Nita Sells, MD (Dermatology) Myrtie Neither Andreas Blower, MD as Consulting Physician (Gastroenterology) Coralyn Helling, MD (Inactive) as Consulting Physician (Pulmonary Disease) There are no Patient Instructions on file for this visit.  Neta Mends. Doniven Vanpatten M.D.

## 2023-09-09 ENCOUNTER — Encounter: Payer: Self-pay | Admitting: Internal Medicine

## 2023-09-09 ENCOUNTER — Other Ambulatory Visit: Payer: Self-pay | Admitting: Internal Medicine

## 2023-09-09 ENCOUNTER — Ambulatory Visit (INDEPENDENT_AMBULATORY_CARE_PROVIDER_SITE_OTHER): Payer: Medicare HMO | Admitting: Internal Medicine

## 2023-09-09 VITALS — BP 110/78 | HR 92 | Temp 97.7°F | Ht 64.0 in | Wt 176.0 lb

## 2023-09-09 DIAGNOSIS — Z0000 Encounter for general adult medical examination without abnormal findings: Secondary | ICD-10-CM | POA: Diagnosis not present

## 2023-09-09 DIAGNOSIS — E785 Hyperlipidemia, unspecified: Secondary | ICD-10-CM | POA: Diagnosis not present

## 2023-09-09 DIAGNOSIS — I10 Essential (primary) hypertension: Secondary | ICD-10-CM | POA: Diagnosis not present

## 2023-09-09 DIAGNOSIS — R7301 Impaired fasting glucose: Secondary | ICD-10-CM

## 2023-09-09 DIAGNOSIS — E876 Hypokalemia: Secondary | ICD-10-CM | POA: Diagnosis not present

## 2023-09-09 DIAGNOSIS — I4819 Other persistent atrial fibrillation: Secondary | ICD-10-CM

## 2023-09-09 DIAGNOSIS — R1032 Left lower quadrant pain: Secondary | ICD-10-CM | POA: Diagnosis not present

## 2023-09-09 DIAGNOSIS — R7989 Other specified abnormal findings of blood chemistry: Secondary | ICD-10-CM

## 2023-09-09 LAB — LIPID PANEL
Cholesterol: 264 mg/dL — ABNORMAL HIGH (ref 0–200)
HDL: 54.8 mg/dL (ref >39.00)
LDL Cholesterol: 168 mg/dL — ABNORMAL HIGH (ref 0–99)
NonHDL: 208.78
Total CHOL/HDL Ratio: 5
Triglycerides: 204 mg/dL — ABNORMAL HIGH (ref 0.0–149.0)
VLDL: 40.8 mg/dL — ABNORMAL HIGH (ref 0.0–40.0)

## 2023-09-09 LAB — CBC WITH DIFFERENTIAL/PLATELET
Basophils Absolute: 0 10*3/uL (ref 0.0–0.1)
Basophils Relative: 0.7 % (ref 0.0–3.0)
Eosinophils Absolute: 0.1 10*3/uL (ref 0.0–0.7)
Eosinophils Relative: 2 % (ref 0.0–5.0)
HCT: 38.7 % (ref 36.0–46.0)
Hemoglobin: 12.9 g/dL (ref 12.0–15.0)
Lymphocytes Relative: 23.1 % (ref 12.0–46.0)
Lymphs Abs: 1.5 10*3/uL (ref 0.7–4.0)
MCHC: 33.5 g/dL (ref 30.0–36.0)
MCV: 86.1 fl (ref 78.0–100.0)
Monocytes Absolute: 0.6 10*3/uL (ref 0.1–1.0)
Monocytes Relative: 9.3 % (ref 3.0–12.0)
Neutro Abs: 4.2 10*3/uL (ref 1.4–7.7)
Neutrophils Relative %: 64.9 % (ref 43.0–77.0)
Platelets: 272 10*3/uL (ref 150.0–400.0)
RBC: 4.49 Mil/uL (ref 3.87–5.11)
RDW: 15.1 % (ref 11.5–15.5)
WBC: 6.5 10*3/uL (ref 4.0–10.5)

## 2023-09-09 LAB — HEPATIC FUNCTION PANEL
ALT: 16 U/L (ref 0–35)
AST: 16 U/L (ref 0–37)
Albumin: 4.4 g/dL (ref 3.5–5.2)
Alkaline Phosphatase: 83 U/L (ref 39–117)
Bilirubin, Direct: 0.1 mg/dL (ref 0.0–0.3)
Total Bilirubin: 0.5 mg/dL (ref 0.2–1.2)
Total Protein: 8 g/dL (ref 6.0–8.3)

## 2023-09-09 LAB — URINALYSIS, ROUTINE W REFLEX MICROSCOPIC
Bilirubin Urine: NEGATIVE
Hgb urine dipstick: NEGATIVE
Ketones, ur: NEGATIVE
Nitrite: POSITIVE — AB
RBC / HPF: NONE SEEN (ref 0–?)
Specific Gravity, Urine: 1.015 (ref 1.000–1.030)
Total Protein, Urine: NEGATIVE
Urine Glucose: NEGATIVE
Urobilinogen, UA: 0.2 (ref 0.0–1.0)
pH: 7.5 (ref 5.0–8.0)

## 2023-09-09 LAB — BASIC METABOLIC PANEL WITH GFR
BUN: 32 mg/dL — ABNORMAL HIGH (ref 6–23)
CO2: 35 meq/L — ABNORMAL HIGH (ref 19–32)
Calcium: 10.2 mg/dL (ref 8.4–10.5)
Chloride: 91 meq/L — ABNORMAL LOW (ref 96–112)
Creatinine, Ser: 0.99 mg/dL (ref 0.40–1.20)
GFR: 56.07 mL/min — ABNORMAL LOW (ref >60.00)
Glucose, Bld: 90 mg/dL (ref 70–99)
Potassium: 3.4 meq/L — ABNORMAL LOW (ref 3.5–5.1)
Sodium: 135 meq/L (ref 135–145)

## 2023-09-09 LAB — MICROALBUMIN / CREATININE URINE RATIO
Creatinine,U: 116.6 mg/dL
Microalb Creat Ratio: 9.1 mg/g (ref 0.0–30.0)
Microalb, Ur: 1.1 mg/dL (ref 0.0–1.9)

## 2023-09-09 NOTE — Patient Instructions (Signed)
 Good to see you today .  If  pain progresses  or sever get back with medical evaluation    Lab today will share with Dr Tenny Craw.

## 2023-09-10 LAB — TSH: TSH: 1.64 u[IU]/mL (ref 0.35–5.50)

## 2023-09-10 LAB — HEMOGLOBIN A1C: Hgb A1c MFr Bld: 6.1 % (ref 4.6–6.5)

## 2023-09-10 NOTE — Telephone Encounter (Signed)
 Pt last Dr Tenny Craw 04/13/23, last labs 04/13/23 Creat 0.66, age 73, weight 79.8kg, based on specified criteria pt is on appropriate dosage of Eliquis 5mg  BID for afib.  Will refill rx.

## 2023-09-13 ENCOUNTER — Encounter: Payer: Self-pay | Admitting: Internal Medicine

## 2023-09-13 NOTE — Progress Notes (Signed)
 Urine is consistent with  UTI  bacteria and inflammation cells.could be cause of the minor discomfort  Potassium still borderline low   Send in macrobid 100 mg bid disp 14  for uti antibiotic . Take extra potassium per day for 5 days  Check potassium level and urinalysis   with reflex micro in 2 weeks

## 2023-09-15 ENCOUNTER — Other Ambulatory Visit: Payer: Self-pay | Admitting: *Deleted

## 2023-09-15 DIAGNOSIS — E876 Hypokalemia: Secondary | ICD-10-CM

## 2023-09-15 DIAGNOSIS — N390 Urinary tract infection, site not specified: Secondary | ICD-10-CM

## 2023-09-15 MED ORDER — NITROFURANTOIN MONOHYD MACRO 100 MG PO CAPS: 100.0000 mg | ORAL_CAPSULE | Freq: Two times a day (BID) | ORAL | 0 refills | Status: DC

## 2023-09-27 ENCOUNTER — Encounter: Payer: Self-pay | Admitting: Internal Medicine

## 2023-09-30 ENCOUNTER — Other Ambulatory Visit (INDEPENDENT_AMBULATORY_CARE_PROVIDER_SITE_OTHER)

## 2023-09-30 DIAGNOSIS — E876 Hypokalemia: Secondary | ICD-10-CM

## 2023-09-30 DIAGNOSIS — N390 Urinary tract infection, site not specified: Secondary | ICD-10-CM | POA: Diagnosis not present

## 2023-09-30 LAB — URINALYSIS, ROUTINE W REFLEX MICROSCOPIC
Bilirubin Urine: NEGATIVE
Hgb urine dipstick: NEGATIVE
Ketones, ur: NEGATIVE
Leukocytes,Ua: NEGATIVE
Nitrite: NEGATIVE
RBC / HPF: NONE SEEN (ref 0–?)
Specific Gravity, Urine: 1.01 (ref 1.000–1.030)
Total Protein, Urine: NEGATIVE
Urine Glucose: NEGATIVE
Urobilinogen, UA: 0.2 (ref 0.0–1.0)
pH: 6 (ref 5.0–8.0)

## 2023-09-30 LAB — POTASSIUM: Potassium: 3.9 meq/L (ref 3.5–5.1)

## 2023-10-04 ENCOUNTER — Encounter: Payer: Self-pay | Admitting: Internal Medicine

## 2023-10-04 NOTE — Progress Notes (Signed)
 Urine is clear  this time  and potassium in normal range

## 2023-10-15 ENCOUNTER — Ambulatory Visit (INDEPENDENT_AMBULATORY_CARE_PROVIDER_SITE_OTHER)

## 2023-10-15 DIAGNOSIS — Z0000 Encounter for general adult medical examination without abnormal findings: Secondary | ICD-10-CM

## 2023-10-15 NOTE — Patient Instructions (Signed)
 Ms. Muni , Thank you for taking time to come for your Medicare Wellness Visit. I appreciate your ongoing commitment to your health goals. Please review the following plan we discussed and let me know if I can assist you in the future.   Referrals/Orders/Follow-Ups/Clinician Recommendations: none  This is a list of the screening recommended for you and due dates:  Health Maintenance  Topic Date Due   COVID-19 Vaccine (1) Never done   Zoster (Shingles) Vaccine (1 of 2) 06/14/2025*   Flu Shot  01/14/2024   Mammogram  01/27/2024   Medicare Annual Wellness Visit  10/14/2024   Colon Cancer Screening  04/27/2026   Pneumonia Vaccine  Completed   DEXA scan (bone density measurement)  Completed   Hepatitis C Screening  Completed   HPV Vaccine  Aged Out   Meningitis B Vaccine  Aged Out   DTaP/Tdap/Td vaccine  Discontinued  *Topic was postponed. The date shown is not the original due date.    Advanced directives: (ACP Link)Information on Advanced Care Planning can be found at [place]  Secretary of [facility] Advance Health Care Directives Advance Health Care Directives. [email or web address]   Next Medicare Annual Wellness Visit scheduled for next year: Yes  Have you seen your provider in the last 6 months (3 months if uncontrolled diabetes)? Yes, physical scheduled for next year in March  insert Preventive Care attachment Insert FALL PREVENTION attachment if needed

## 2023-10-15 NOTE — Progress Notes (Signed)
 Subjective:   Penny Barnett is a 73 y.o. who presents for a Medicare Wellness preventive visit.  Visit Complete: Virtual I connected with  Penny Barnett on 10/15/23 by a video and audio enabled telemedicine application and verified that I am speaking with the correct person using two identifiers.  Patient Location: Home  Provider Location: Office/Clinic  I discussed the limitations of evaluation and management by telemedicine. The patient expressed understanding and agreed to proceed.  Vital Signs: Because this visit was a virtual/telehealth visit, some criteria may be missing or patient reported. Any vitals not documented were not able to be obtained and vitals that have been documented are patient reported.    Persons Participating in Visit: Patient.  AWV Questionnaire: Yes: Patient Medicare AWV questionnaire was completed by the patient on 10/14/2023; I have confirmed that all information answered by patient is correct and no changes since this date.  Cardiac Risk Factors include: advanced age (>9men, >36 women);dyslipidemia;hypertension     Objective:    Today's Vitals   There is no height or weight on file to calculate BMI.     10/15/2023    2:08 PM 08/31/2022   10:32 AM 01/27/2022    8:03 AM 08/27/2021   10:56 AM 08/26/2020   10:29 AM 08/06/2020    7:39 AM 05/17/2020   11:55 PM  Advanced Directives  Does Patient Have a Medical Advance Directive? No No No No No No No  Does patient want to make changes to medical advance directive?     Yes (MAU/Ambulatory/Procedural Areas - Information given)    Would patient like information on creating a medical advance directive? No - Patient declined No - Patient declined  No - Patient declined  No - Patient declined No - Patient declined    Current Medications (verified) Outpatient Encounter Medications as of 10/15/2023  Medication Sig   acetaminophen  (TYLENOL ) 325 MG tablet Take 325-650 mg by mouth at bedtime as needed for  moderate pain.   apixaban  (ELIQUIS ) 5 MG TABS tablet TAKE 1 TABLET TWICE DAILY   bisoprolol  (ZEBETA ) 5 MG tablet TAKE 1 TABLET EVERY DAY   Cholecalciferol  (VITAMIN D -3) 125 MCG (5000 UT) TABS Take 5,000 Units by mouth daily.   diclofenac Sodium (VOLTAREN) 1 % GEL Apply 2 g topically daily as needed (Pain).   diphenhydramine -acetaminophen  (TYLENOL  PM) 25-500 MG TABS tablet Take 1 tablet by mouth at bedtime as needed (sleep).   fluticasone  furoate-vilanterol (BREO ELLIPTA ) 100-25 MCG/ACT AEPB Inhale 1 puff into the lungs daily.   furosemide  (LASIX ) 40 MG tablet TAKE 1 TABLET EVERY DAY (NEED MD APPOINTMENT)   montelukast  (SINGULAIR ) 10 MG tablet TAKE 1 TABLET EVERY MORNING   Multiple Vitamins-Iron (MULTIVITAMIN/IRON PO) Take 1 tablet by mouth daily.   Multiple Vitamins-Minerals (LUTEIN-ZEAXANTHIN PO) Take 1 tablet by mouth daily.   omeprazole (PRILOSEC) 20 MG capsule Take 20 mg by mouth daily.   potassium chloride  SA (KLOR-CON  M) 20 MEQ tablet TAKE 2 TABLETS (40 MEQ) BY MOUTH DAILY   Tetrahydrozoline HCl (VISINE OP) Place 1 drop into both eyes daily as needed (redness).   triamterene -hydrochlorothiazide  (MAXZIDE-25) 37.5-25 MG tablet TAKE 1/2 TABLET EVERY DAY   vitamin B-12 (CYANOCOBALAMIN ) 500 MCG tablet Take 500 mcg by mouth daily.   nitrofurantoin , macrocrystal-monohydrate, (MACROBID ) 100 MG capsule Take 1 capsule (100 mg total) by mouth 2 (two) times daily. (Patient not taking: Reported on 10/15/2023)   No facility-administered encounter medications on file as of 10/15/2023.    Allergies (verified) Statins  and Penicillins   History: Past Medical History:  Diagnosis Date   Allergy    Anemia    Arthritis    hands   Asthma    as a child and then attack 1996  poss allergic to cat hair. ventilated x 2 . saw Dr Almeda Jacobs then .    Atrial flutter (HCC)    a. 12/2014 Echo: EF 60-65%, no rwma, mild MR;  b. S/P TEE/DCCV;  c. CHA2DS2VASc = 3-->Eliquis .   Cataract    Had surgery to remove   GERD  (gastroesophageal reflux disease)    2000 taking otc prevacid    evey day.    Heart murmur    Hyperlipidemia    Hypertension     3 meds controlled   Kidney stone    passed stone - no surgery required   Past Surgical History:  Procedure Laterality Date   CARDIOVERSION N/A 01/03/2015   Procedure: CARDIOVERSION;  Surgeon: Elmyra Haggard, MD;  Location: [facility] ENDOSCOPY;  Service: Cardiovascular;  Laterality: N/A;   CARDIOVERSION N/A 08/06/2020   Procedure: CARDIOVERSION;  Surgeon: Hazle Lites, MD;  Location: [facility] ENDOSCOPY;  Service: Cardiovascular;  Laterality: N/A;   CARDIOVERSION N/A 01/27/2022   Procedure: CARDIOVERSION;  Surgeon: Sonny Dust, MD;  Location: [facility] ENDOSCOPY;  Service: Cardiovascular;  Laterality: N/A;   CATARACT EXTRACTION Bilateral 10/14/2010   both eyes   COLONOSCOPY  02/2011   [facility]   EYE SURGERY  may 2012   cataracts   SHOULDER SURGERY Bilateral    x 2 - frozen shoulder rt 2006, 2000 left    TEE WITHOUT CARDIOVERSION N/A 01/03/2015   Procedure: TRANSESOPHAGEAL ECHOCARDIOGRAM (TEE);  Surgeon: Elmyra Haggard, MD;  Location: [facility] ENDOSCOPY;  Service: Cardiovascular;  Laterality: N/A;   TONSILLECTOMY     Family History  Problem Relation Age of Onset   Breast cancer Mother    Bladder Cancer Mother    Cancer Mother    Uterine cancer Sister    Cancer Sister    Alcohol abuse Father    Breast cancer Maternal Grandmother    Colon cancer Neg Hx    Heart attack Neg Hx    Stroke Neg Hx    Hypertension Neg Hx    Esophageal cancer Neg Hx    Rectal cancer Neg Hx    Stomach cancer Neg Hx    Social History   Socioeconomic History   Marital status: Single    Spouse name: Not on file   Number of children: Not on file   Years of education: Not on file   Highest education level: GED or equivalent  Occupational History   Occupation: CSR  Tobacco Use   Smoking status: Former    Current packs/day: 0.00    Average packs/day: 1 pack/day for 37.0 years (37.0 ttl  pk-yrs)    Types: Cigarettes    Start date: 06/21/1964    Quit date: 06/21/1986    Years since quitting: 37.3   Smokeless tobacco: Never  Vaping Use   Vaping status: Never Used  Substance and Sexual Activity   Alcohol use: No   Drug use: No   Sexual activity: Not Currently    Birth control/protection: Post-menopausal  Other Topics Concern   Not on file  Social History Narrative   [place]      In [place] for 30 years  Has family here   Works 45 per week [employer]    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    HS education  single   6 hours of sleep   Hh  Of 1  Pet dog and a horse.    Remote tobacco  1-2 ppd stopped 1988    Etoh couple times a month rare    ocass  Diet soda.             Social Drivers of Corporate investment banker Strain: Low Risk  (10/15/2023)   Overall Financial Resource Strain (CARDIA)    Difficulty of Paying Living Expenses: Not hard at all  Food Insecurity: No Food Insecurity (10/15/2023)   Hunger Vital Sign    Worried About Running Out of Food in the Last Year: Never true    Ran Out of Food in the Last Year: Never true  Transportation Needs: No Transportation Needs (08/18/2023)   PRAPARE - Administrator, Civil Service (Medical): No    Lack of Transportation (Non-Medical): No  Physical Activity: Sufficiently Active (10/15/2023)   Exercise Vital Sign    Days of Exercise per Week: 5 days    Minutes of Exercise per Session: 60 min  Stress: No Stress Concern Present (10/15/2023)   [employer] of Occupational Health - Occupational Stress Questionnaire    Feeling of Stress : Not at all  Social Connections: Socially Isolated (10/15/2023)   Social Connection and Isolation Panel [NHANES]    Frequency of Communication with Friends and Family: Three times a week    Frequency of Social Gatherings with Friends and Family: Twice a week    Attends Religious Services: Never    Database administrator or Organizations: No    Attends Museum/gallery exhibitions officer: Never    Marital Status: Never married    Tobacco Counseling Counseling given: Not Answered    Clinical Intake:  Pre-visit preparation completed: Yes  Pain : No/denies pain     Nutritional Risks: None Diabetes: No  Lab Results  Component Value Date   HGBA1C 6.1 09/09/2023   HGBA1C 5.8 07/22/2022   HGBA1C 5.9 (H) 01/23/2020     How often do you need to have someone help you when you read instructions, pamphlets, or other written materials from your doctor or pharmacy?: 1 - Never  Interpreter Needed?: No  Information entered by :: NAllen LPN   Activities of Daily Living     10/14/2023    4:28 PM  In your present state of health, do you have any difficulty performing the following activities:  Hearing? 0  Vision? 0  Difficulty concentrating or making decisions? 0  Walking or climbing stairs? 0  Dressing or bathing? 0  Doing errands, shopping? 0  Preparing Food and eating ? N  Using the Toilet? N  In the past six months, have you accidently leaked urine? N  Do you have problems with loss of bowel control? N  Managing your Medications? N  Managing your Finances? N  Housekeeping or managing your Housekeeping? N    Patient Care Team: Panosh, Joaquim Muir, MD as PCP - General (Internal Medicine) Elmyra Haggard, MD as PCP - Cardiology (Cardiology) Denman Fischer, MD (Dermatology) Dominic Friendly Cordelia Dessert, MD as Consulting Physician (Gastroenterology) Wilder Handy, MD (Inactive) as Consulting Physician (Pulmonary Disease)  Indicate any recent Medical Services you may have received from other than [facility] providers in the past year (date may be approximate).     Assessment:   This is a routine wellness examination for Maleni.  Hearing/Vision  screen Hearing Screening - Comments:: Denies hearing issues Vision Screening - Comments:: Regular eye exams, [facility]   Goals Addressed             This Visit's Progress    Patient Stated        10/15/2023, wants to lose weight       Depression Screen     10/15/2023    2:09 PM 09/09/2023   11:15 AM 08/31/2022   10:30 AM 07/22/2022   10:00 AM 08/27/2021   10:51 AM 08/26/2020   10:31 AM 01/23/2020    9:21 AM  PHQ 2/9 Scores  PHQ - 2 Score 0 0 0 0 0 0 0  PHQ- 9 Score 0 2 0 3   0    Fall Risk     10/14/2023    4:28 PM 09/09/2023   11:14 AM 09/25/2022    8:58 AM 08/31/2022   10:32 AM 08/30/2022    9:41 PM  Fall Risk   Falls in the past year? 0 0 0 0 0  Number falls in past yr: 0 0  0   Injury with Fall? 0 0  0   Risk for fall due to : Medication side effect No Fall Risks  No Fall Risks   Follow up Falls evaluation completed;Falls prevention discussed Falls evaluation completed  Falls prevention discussed     MEDICARE RISK AT HOME:  Medicare Risk at Home Any stairs in or around the home?: (Patient-Rptd) No Home free of loose throw rugs in walkways, pet beds, electrical cords, etc?: (Patient-Rptd) No Adequate lighting in your home to reduce risk of falls?: (Patient-Rptd) Yes Life alert?: (Patient-Rptd) No Use of a cane, walker or w/c?: (Patient-Rptd) No Grab bars in the bathroom?: (Patient-Rptd) No Shower chair or bench in shower?: (Patient-Rptd) No Elevated toilet seat or a handicapped toilet?: (Patient-Rptd) No  TIMED UP AND GO:  Was the test performed?  No  Cognitive Function: 6CIT completed    12/29/2017   11:22 AM  MMSE - Mini Mental State Exam  Not completed: --        10/15/2023    2:09 PM 08/31/2022   10:33 AM 08/27/2021   10:56 AM  6CIT Screen  What Year? 0 points 0 points 0 points  What month? 0 points 0 points 0 points  What time? 0 points 0 points 0 points  Count back from 20 0 points 0 points 0 points  Months in reverse 0 points 0 points 0 points  Repeat phrase 0 points 0 points 0 points  Total Score 0 points 0 points 0 points    Immunizations Immunization History  Administered Date(s) Administered   Fluad Quad(high Dose 65+) 03/11/2019    Influenza Split 04/13/2011   Influenza, High Dose Seasonal PF 02/12/2015, 04/05/2018   Influenza,inj,Quad PF,6+ Mos 03/29/2017   Pneumococcal Conjugate-13 10/16/2013, 10/23/2014   Pneumococcal Polysaccharide-23 10/25/2015    Screening Tests Health Maintenance  Topic Date Due   COVID-19 Vaccine (1) Never done   Zoster Vaccines- Shingrix (1 of 2) 06/14/2025 (Originally 12/07/1969)   INFLUENZA VACCINE  01/14/2024   MAMMOGRAM  01/27/2024   Medicare Annual Wellness (AWV)  10/14/2024   Colonoscopy  04/27/2026   Pneumonia Vaccine 16+ Years old  Completed   DEXA SCAN  Completed   Hepatitis C Screening  Completed   HPV VACCINES  Aged Out   Meningococcal B Vaccine  Aged Out   DTaP/Tdap/Td  Discontinued    Health Maintenance  Health Maintenance  Due  Topic Date Due   COVID-19 Vaccine (1) Never done   Health Maintenance Items Addressed: Declines covid vaccine  Additional Screening:  Vision Screening: Recommended annual ophthalmology exams for early detection of glaucoma and other disorders of the eye.  Dental Screening: Recommended annual dental exams for proper oral hygiene  Community Resource Referral / Chronic Care Management: CRR required this visit?  No   CCM required this visit?  No     Plan:     I have personally reviewed and noted the following in the patient's chart:   Medical and social history Use of alcohol, tobacco or illicit drugs  Current medications and supplements including opioid prescriptions. Patient is not currently taking opioid prescriptions. Functional ability and status Nutritional status Physical activity Advanced directives List of other physicians Hospitalizations, surgeries, and ER visits in previous 12 months Vitals Screenings to include cognitive, depression, and falls Referrals and appointments  In addition, I have reviewed and discussed with patient certain preventive protocols, quality metrics, and best practice recommendations. A  written personalized care plan for preventive services as well as general preventive health recommendations were provided to patient.     Areatha Beecham, LPN   2/0/2542   After Visit Summary: (MyChart) Due to this being a telephonic visit, the after visit summary with patients personalized plan was offered to patient via MyChart   Notes: Nothing significant to report at this time.

## 2023-11-21 ENCOUNTER — Other Ambulatory Visit: Payer: Self-pay | Admitting: Family

## 2023-11-30 ENCOUNTER — Encounter: Payer: Self-pay | Admitting: Internal Medicine

## 2023-12-02 MED ORDER — FLUTICASONE FUROATE-VILANTEROL 100-25 MCG/ACT IN AEPB: 1.0000 | INHALATION_SPRAY | Freq: Every day | RESPIRATORY_TRACT | 3 refills | Status: AC

## 2023-12-02 NOTE — Telephone Encounter (Signed)
 Ok to refill breo  for 1 year

## 2023-12-11 ENCOUNTER — Encounter: Payer: Self-pay | Admitting: Internal Medicine

## 2023-12-16 MED ORDER — ALBUTEROL SULFATE HFA 108 (90 BASE) MCG/ACT IN AERS: 2.0000 | INHALATION_SPRAY | Freq: Four times a day (QID) | RESPIRATORY_TRACT | 1 refills | Status: AC | PRN

## 2023-12-22 ENCOUNTER — Other Ambulatory Visit: Payer: Self-pay | Admitting: Family

## 2023-12-22 DIAGNOSIS — J45909 Unspecified asthma, uncomplicated: Secondary | ICD-10-CM

## 2023-12-29 DIAGNOSIS — H5213 Myopia, bilateral: Secondary | ICD-10-CM | POA: Diagnosis not present

## 2023-12-29 DIAGNOSIS — H43813 Vitreous degeneration, bilateral: Secondary | ICD-10-CM | POA: Diagnosis not present

## 2023-12-29 DIAGNOSIS — H524 Presbyopia: Secondary | ICD-10-CM | POA: Diagnosis not present

## 2024-01-12 ENCOUNTER — Other Ambulatory Visit: Payer: Self-pay | Admitting: Internal Medicine

## 2024-01-12 DIAGNOSIS — Z1231 Encounter for screening mammogram for malignant neoplasm of breast: Secondary | ICD-10-CM

## 2024-01-13 ENCOUNTER — Other Ambulatory Visit: Payer: Self-pay | Admitting: Internal Medicine

## 2024-01-14 ENCOUNTER — Ambulatory Visit (INDEPENDENT_AMBULATORY_CARE_PROVIDER_SITE_OTHER): Admitting: Family Medicine

## 2024-01-14 ENCOUNTER — Ambulatory Visit: Payer: Self-pay

## 2024-01-14 VITALS — BP 108/82 | HR 46 | Temp 98.4°F | Ht 64.0 in | Wt 181.4 lb

## 2024-01-14 DIAGNOSIS — M2142 Flat foot [pes planus] (acquired), left foot: Secondary | ICD-10-CM

## 2024-01-14 DIAGNOSIS — M25473 Effusion, unspecified ankle: Secondary | ICD-10-CM | POA: Diagnosis not present

## 2024-01-14 DIAGNOSIS — M79672 Pain in left foot: Secondary | ICD-10-CM

## 2024-01-14 NOTE — Progress Notes (Signed)
 Established Patient Office Visit   Subjective  Patient ID: Penny Barnett, female    DOB: July 23, 1950  Age: 73 y.o. MRN: [id number]  Chief Complaint  Patient presents with   Acute Visit    Left foot and ankle pain, Started 3 weeks ago, sharp shooting pain, on the top and lateral side of the foot and ankle, patient denies any swelling, rate of pain 3/10 but has been worst     Patient is a 73 year old female followed by Dr. Charlett and seen for ongoing concern.  Patient endorses intermittent left foot pain x 3 weeks.  Pain 3/10 discomfort on lateral side of foot and top of foot.  Some swelling noted.  Denies injury, erythema.  Taking Tylenol  as needed.  Wearing sneakers to work.  Typically walks barefooted at home.    Patient Active Problem List   Diagnosis Date Noted   Pneumonia due to COVID-19 virus 05/16/2020   Collagenous colitis 07/25/2016   Chronic anticoagulation 02/12/2015   Diarrhea 01/29/2015   Cardiomyopathy (HCC)    Persistent atrial fibrillation (HCC)    Atrial flutter with RVR 12/31/2014   Fatigue 12/30/2014   Hypokalemia 12/30/2014   Skin lesion poss AK  face. 10/16/2013   Encounter for routine gynecological examination 10/14/2012   LFTs abnormal 10/07/2011   Visit for preventive health examination 10/07/2011   Diverticulosis 09/20/2011   History of kidney stones 04/19/2011   Asthma    GERD (gastroesophageal reflux disease)    Hypertension    Hyperlipidemia    Past Medical History:  Diagnosis Date   Allergy    Anemia    Arthritis    hands   Asthma    as a child and then attack 1996  poss allergic to cat hair. ventilated x 2 . saw Dr Frutoso then .    Atrial flutter (HCC)    a. 12/2014 Echo: EF 60-65%, no rwma, mild MR;  b. S/P TEE/DCCV;  c. CHA2DS2VASc = 3-->Eliquis .   Cataract    Had surgery to remove   GERD (gastroesophageal reflux disease)    2000 taking otc prevacid    evey day.    Heart murmur    Hyperlipidemia    Hypertension     3 meds  controlled   Kidney stone    passed stone - no surgery required   Past Surgical History:  Procedure Laterality Date   CARDIOVERSION N/A 01/03/2015   Procedure: CARDIOVERSION;  Surgeon: Vina Okey GAILS, MD;  Location: [facility] ENDOSCOPY;  Service: Cardiovascular;  Laterality: N/A;   CARDIOVERSION N/A 08/06/2020   Procedure: CARDIOVERSION;  Surgeon: Mona Vinie BROCKS, MD;  Location: [facility] ENDOSCOPY;  Service: Cardiovascular;  Laterality: N/A;   CARDIOVERSION N/A 01/27/2022   Procedure: CARDIOVERSION;  Surgeon: Hobart Powell BRAVO, MD;  Location: [facility] ENDOSCOPY;  Service: Cardiovascular;  Laterality: N/A;   CATARACT EXTRACTION Bilateral 10/14/2010   both eyes   COLONOSCOPY  02/2011   [facility]   EYE SURGERY  may 2012   cataracts   SHOULDER SURGERY Bilateral    x 2 - frozen shoulder rt 2006, 2000 left    TEE WITHOUT CARDIOVERSION N/A 01/03/2015   Procedure: TRANSESOPHAGEAL ECHOCARDIOGRAM (TEE);  Surgeon: Vina Okey GAILS, MD;  Location: [facility] ENDOSCOPY;  Service: Cardiovascular;  Laterality: N/A;   TONSILLECTOMY     Social History   Tobacco Use   Smoking status: Former    Current packs/day: 0.00    Average packs/day: 1 pack/day for 37.0 years (37.0 ttl pk-yrs)    Types:  Cigarettes    Start date: 06/21/1964    Quit date: 06/21/1986    Years since quitting: 37.5   Smokeless tobacco: Never  Vaping Use   Vaping status: Never Used  Substance Use Topics   Alcohol use: No   Drug use: No   Family History  Problem Relation Age of Onset   Breast cancer Mother    Bladder Cancer Mother    Cancer Mother    Uterine cancer Sister    Cancer Sister    Alcohol abuse Father    Breast cancer Maternal Grandmother    Colon cancer Neg Hx    Heart attack Neg Hx    Stroke Neg Hx    Hypertension Neg Hx    Esophageal cancer Neg Hx    Rectal cancer Neg Hx    Stomach cancer Neg Hx    Allergies  Allergen Reactions   Statins     Joint and muscle pain   Penicillins Other (See Comments)    From childhood- reaction not  recalled    ROS Negative unless stated above    Objective:     BP 108/82 (BP Location: Left Arm, Patient Position: Sitting, Cuff Size: Normal)   Pulse (!) 46   Temp 98.4 F (36.9 C) (Oral)   Ht 5' 4 (1.626 m)   Wt 181 lb 6.4 oz (82.3 kg)   SpO2 98%   BMI 31.14 kg/m  BP Readings from Last 3 Encounters:  01/14/24 108/82  09/09/23 110/78  08/19/23 120/84   Wt Readings from Last 3 Encounters:  01/14/24 181 lb 6.4 oz (82.3 kg)  09/09/23 176 lb (79.8 kg)  08/19/23 175 lb 9.6 oz (79.7 kg)      Physical Exam Constitutional:      Appearance: Normal appearance.  HENT:     Head: Normocephalic and atraumatic.     Mouth/Throat:     Mouth: Mucous membranes are moist.  Cardiovascular:     Rate and Rhythm: Normal rate.     Pulses:          Dorsalis pedis pulses are 2+ on the right side and 2+ on the left side.       Posterior tibial pulses are 2+ on the right side and 2+ on the left side.     Comments: Nonpitting edema left ankle. Pulmonary:     Effort: Pulmonary effort is normal.  Musculoskeletal:     Left lower leg: Edema present.     Right foot: Normal.     Comments: Edema of left ankle.  No TTP of left foot.  Callus on lateral side of left fifth MTP joint with mild to moderate angulation of the joint medially.  Left foot arch decreased compared to right.  Skin:    General: Skin is warm and dry.     Comments: Spider veins present on bilateral LEs.  Neurological:     Mental Status: She is alert and oriented to person, place, and time.     Cranial Nerves: Cranial nerves 2-12 are intact.     Comments: Normal strength in bilateral feet.  Psychiatric:        Behavior: Behavior is cooperative.        01/14/2024   12:08 PM 10/15/2023    2:09 PM 09/09/2023   11:15 AM  Depression screen PHQ 2/9  Decreased Interest 0 0 0  Down, Depressed, Hopeless 0 0 0  PHQ - 2 Score 0 0 0  Altered sleeping 0 0 1  Tired, decreased energy 1 0 1  Change in appetite 1 0 0  Feeling bad or  failure about yourself  0 0 0  Trouble concentrating 0 0 0  Moving slowly or fidgety/restless 0 0 0  Suicidal thoughts 0 0 0  PHQ-9 Score 2 0 2  Difficult doing work/chores Not difficult at all Not difficult at all Not difficult at all      01/14/2024   12:08 PM 09/09/2023   11:14 AM 07/22/2022   10:00 AM  GAD 7 : Generalized Anxiety Score  Nervous, Anxious, on Edge 0 0 0  Control/stop worrying 0 0 0  Worry too much - different things 0 0 0  Trouble relaxing 0 0 0  Restless 0 0 0  Easily annoyed or irritable 1 0 1  Afraid - awful might happen 0 0 0  Total GAD 7 Score 1 0 1  Anxiety Difficulty Not difficult at all Not difficult at all Not difficult at all     No results found for any visits on 01/14/24.    Assessment & Plan:   Flat foot (pes planus) (acquired), left foot  Left foot pain  Ankle edema   Intermittent left foot pain likely 2/2 loss of arch.  No concern for fracture or infection.  Discussed supportive care including OTC arch supports.  Okay to continue Tylenol  as needed.  Foot and ankle wrapped with Ace bandage in clinic by this provider.  Consider custom insoles.  Given handout.  Follow-up for continued or worsening symptoms.  Return if symptoms worsen or fail to improve.   Clotilda JONELLE Single, MD

## 2024-01-14 NOTE — Telephone Encounter (Signed)
 FYI Only or Action Required?: FYI only for provider.  Patient was last seen in primary care on 09/09/2023 by Panosh, Apolinar POUR, MD.  Called Nurse Triage reporting Foot Pain.  Symptoms began several weeks ago.  Interventions attempted: OTC medications: tylenol  with relief.  Symptoms are: unchanged.  Triage Disposition: See Physician Within 24 Hours  Patient/caregiver understands and will follow disposition?: Yes      Copied from CRM [id number]. Topic: Clinical - Red Word Triage >> Jan 14, 2024  8:08 AM Revonda D wrote: Red Word that prompted transfer to Nurse Triage: Pain   Pt stated that she is experiencing pain in her left foot and ankle. Pt stated that she can barely put any weight on it. Pt would like to schedule an appt with her PCP. Reason for Disposition  Weakness (i.e., loss of strength) of new-onset in foot or toes  (Exceptions: Not truly weak, foot feels weak because of pain; weakness present > 2 weeks.)  Answer Assessment - Initial Assessment Questions 1. ONSET: When did the pain start?      Intermittent for a few weeks 2. LOCATION: Where is the pain located?      L ankle - side and top 3. PAIN: How bad is the pain?    (Scale 1-10; or mild, moderate, severe)     5-6/10 last night, 2-3/10 now -- is able to bare weight Unable to sleep last night, took Tylenol  with some relief 4. WORK OR EXERCISE: Has there been any recent work or exercise that involved this part of the body?      denies 5. CAUSE: What do you think is causing the foot pain?     unknown 6. OTHER SYMPTOMS: Do you have any other symptoms? (e.g., leg pain, rash, fever, numbness)     Occasional numbness/tingling in bilateral feet 7. PREGNANCY: Is there any chance you are pregnant? When was your last menstrual period?     N/a  Protocols used: Foot Pain-A-AH

## 2024-01-20 ENCOUNTER — Encounter: Payer: Self-pay | Admitting: Internal Medicine

## 2024-01-23 ENCOUNTER — Other Ambulatory Visit: Payer: Self-pay | Admitting: Internal Medicine

## 2024-01-24 NOTE — Telephone Encounter (Signed)
 I dont think you need a referral  but call for appt and see if they request or from insurance   just want to make sure in network.

## 2024-01-25 ENCOUNTER — Other Ambulatory Visit: Payer: Self-pay

## 2024-01-25 DIAGNOSIS — J45909 Unspecified asthma, uncomplicated: Secondary | ICD-10-CM

## 2024-01-25 MED ORDER — MONTELUKAST SODIUM 10 MG PO TABS: ORAL_TABLET | ORAL | 3 refills | Status: AC

## 2024-02-01 ENCOUNTER — Ambulatory Visit
Admission: RE | Admit: 2024-02-01 | Discharge: 2024-02-01 | Disposition: A | Discharge status: Home / Self Care | Source: Ambulatory Visit | Attending: Internal Medicine | Admitting: Internal Medicine

## 2024-02-01 DIAGNOSIS — Z1231 Encounter for screening mammogram for malignant neoplasm of breast: Secondary | ICD-10-CM | POA: Diagnosis not present

## 2024-03-04 ENCOUNTER — Other Ambulatory Visit: Payer: Self-pay | Admitting: Internal Medicine

## 2024-03-06 NOTE — Telephone Encounter (Signed)
 Pt last saw Dr Okey 04/13/23, last labs 09/09/23 Creat 0.99, age 73, weight 82.3kg, based on specified criteria pt is on appropriate dosage of Eliquis  5mg  BID for afib.  Will refill rx.

## 2024-04-08 ENCOUNTER — Other Ambulatory Visit: Payer: Self-pay | Admitting: Internal Medicine

## 2024-04-25 ENCOUNTER — Ambulatory Visit: Admitting: Internal Medicine

## 2024-05-19 ENCOUNTER — Encounter: Payer: Self-pay | Admitting: Internal Medicine

## 2024-05-19 ENCOUNTER — Other Ambulatory Visit: Payer: Self-pay | Admitting: Internal Medicine

## 2024-05-28 NOTE — Progress Notes (Unsigned)
 Cardiology Office Note   Date:  05/30/2024   ID:  ALLEENE STOY, DOB 01-Apr-1951, MRN [id number]  PCP:  Charlett Apolinar POUR, MD  Cardiologist:   Vina Gull, MD   F/U of paroxysmal atrial flutter, atrial fibrillaton  and HTN      History of Present Illness: Penny Barnett is a 73 y.o. female with a history of atrial flutter, CAD, HTN, HL, asthma, statin intolerance   2016  Afib     S/p TEE/DCCV    Also a hx of CAD (Ca score in 2024 was 391)   Nov 2021  Hosp with COVID  Was hyperthyroid at time, treated  methimazole .   Was in afib   Cardioverted      Sept 2022  Monitor showed no recurrence   Eliquis  stopped  2023  Ca score CT   Ca score was 391   2023  Myoview    No ischemia LVEF 52% July 2023  Echo  LVEF normal 63%  Severe LAE    July 2023 she  was back in afib  She noted a little less energy.   Placed on Eliquis    In Aug 2023 she underwent DC cardioversion.   I saw the pt in Fall 2024    Pt working at [employer] at [place]   [place]ks 4 days per week   [employer] about 3 miles / day   A little more SOB   Attrib to weight gain No CP  No dizziness No palpitations .   Occasional edema in legs     Allergies:   Statins and Penicillins   Past Medical History:  Diagnosis Date   Allergy    Anemia    Arthritis    hands   Asthma    as a child and then attack 1996  poss allergic to cat hair. ventilated x 2 . saw Dr Frutoso then .    Atrial flutter (HCC)    a. 12/2014 Echo: EF 60-65%, no rwma, mild MR;  b. S/P TEE/DCCV;  c. CHA2DS2VASc = 3-->Eliquis .   Cataract    Had surgery to remove   GERD (gastroesophageal reflux disease)    2000 taking otc prevacid    evey day.    Heart murmur    Hyperlipidemia    Hypertension     3 meds controlled   Kidney stone    passed stone - no surgery required    Past Surgical History:  Procedure Laterality Date   CARDIOVERSION N/A 01/03/2015   Procedure: CARDIOVERSION;  Surgeon: Vina Gull GAILS, MD;  Location: [facility] ENDOSCOPY;  Service:  Cardiovascular;  Laterality: N/A;   CARDIOVERSION N/A 08/06/2020   Procedure: CARDIOVERSION;  Surgeon: Mona Vinie BROCKS, MD;  Location: [facility] ENDOSCOPY;  Service: Cardiovascular;  Laterality: N/A;   CARDIOVERSION N/A 01/27/2022   Procedure: CARDIOVERSION;  Surgeon: Hobart Powell BRAVO, MD;  Location: [facility] ENDOSCOPY;  Service: Cardiovascular;  Laterality: N/A;   CATARACT EXTRACTION Bilateral 10/14/2010   both eyes   COLONOSCOPY  02/2011   [facility]   EYE SURGERY  may 2012   cataracts   SHOULDER SURGERY Bilateral    x 2 - frozen shoulder rt 2006, 2000 left    TEE WITHOUT CARDIOVERSION N/A 01/03/2015   Procedure: TRANSESOPHAGEAL ECHOCARDIOGRAM (TEE);  Surgeon: Vina Gull GAILS, MD;  Location: [facility] ENDOSCOPY;  Service: Cardiovascular;  Laterality: N/A;   TONSILLECTOMY       Social History:  The patient  reports that she quit smoking about 37 years ago. Her  smoking use included cigarettes. She started smoking about 59 years ago. She has a 37 pack-year smoking history. She has never used smokeless tobacco. She reports that she does not drink alcohol and does not use drugs.   Family History:  The patient's family history includes Alcohol abuse in her father; Bladder Cancer in her mother; Breast cancer in her maternal grandmother and mother; Cancer in her mother and sister; Uterine cancer in her sister.    ROS:  Please see the history of present illness. All other systems are reviewed and  Negative to the above problem except as noted.    PHYSICAL EXAM: VS:  BP 110/70   Pulse 77   Ht 5' 5 (1.651 m)   Wt 175 lb (79.4 kg)   SpO2 96%   BMI 29.12 kg/m   GEN: Pt is  in no acute distress  HEENT: normal  Neck: JVP is normal   No carotid bruits Cardiac: Irreg irreg  No  murmur    Respiratory:  clear to auscultation  GI: soft, nontender,  No hepatomegaly Ventral hernia Ext  Triv LE edema   EKG:  EKG is ordered today.   Atrial fibrillation   77 bpm   Nonspecific ST changes       The following  studies were reviewed today:  Echo  July 2023  1. Left ventricular ejection fraction, by estimation, is 60 to 65%. Left  ventricular ejection fraction by 3D volume is 63 %. The left ventricle has  normal function. The left ventricle has no regional wall motion  abnormalities. There is mild asymmetric  left ventricular hypertrophy of the basal-septal segment. Left ventricular  diastolic parameters are indeterminate. The average left ventricular  global longitudinal strain is -22.4 %.   2. Right ventricular systolic function is normal. The right ventricular  size is mildly enlarged. There is mildly elevated pulmonary artery  systolic pressure. The estimated right ventricular systolic pressure is  40.2 mmHg.   3. Left atrial size was severely dilated.   4. Right atrial size was mildly dilated.   5. The mitral valve is normal in structure. Mild mitral valve  regurgitation. No evidence of mitral stenosis.   6. Tricuspid valve regurgitation is moderate.   7. The aortic valve is tricuspid. Aortic valve regurgitation is trivial.   Stress test 11/2021 The study is normal. The study is low risk.   No ST deviation was noted.   Left ventricular function is normal. Nuclear stress EF: 52 %. The left ventricular ejection fraction is mildly decreased (45-54%). End diastolic cavity size is normal.   Prior study not available for comparison.   Monitor 02/2021 Patch Wear Time:  7 days and 14 hours (2022-08-20T10:39:16-0400 to 2022-08-28T00:54:20-0400)   Predominant rhythm:  Sinus rhythm   Overall heart rates 52 to 174 bpm   Average HR 67 bpm   Intermttent SVT, longest lasting 21.4 sec (130 bpm)   Rare PVCs,   Occasional PVCs     No diary entries/triggered events      Lipid Panel    Component Value Date/Time   CHOL 264 (H) 09/09/2023 1202   CHOL 223 (H) 07/29/2020 1208   TRIG 204.0 (H) 09/09/2023 1202   HDL 54.80 09/09/2023 1202   HDL 58 07/29/2020 1208   CHOLHDL 5 09/09/2023 1202   VLDL 40.8  (H) 09/09/2023 1202   LDLCALC 168 (H) 09/09/2023 1202   LDLCALC 133 (H) 07/29/2020 1208   LDLCALC 170 (H) 01/23/2020 1022   LDLDIRECT 129.0  11/28/2018 1048      Wt Readings from Last 3 Encounters:  05/30/24 175 lb (79.4 kg)  01/14/24 181 lb 6.4 oz (82.3 kg)  09/09/23 176 lb (79.8 kg)      ASSESSMENT AND PLAN:  1  Atrial fibrillation   Pt with permanent atrial fibrillation   Tolerating with rate control   Echo in June 2023  LVEF normal  Severe LAE  REcomm   Continue rate control and Eliquis     Check CBC   2  CAD  Pt with Ca score of 391 in 2023  Myoview  without ischemia in 2023   She has some dyspnea now   I am not convinced angina but will need to follow     Manage risk factors (though pt quit meds)  3  HL Pt intolerant to statins Also quit Repatha   REcheck lipids as baselne   Follow up  May refer to pharmacy     4  HTN  BP remains controlled    5 Metabolic  A1C 6.1 in March  Will recheck   Reviewed diet with patient     Follow up in 9 months   Signed, Vina Gull, MD  05/30/2024 8:18 AM

## 2024-05-30 ENCOUNTER — Ambulatory Visit: Attending: Internal Medicine | Admitting: Internal Medicine

## 2024-05-30 ENCOUNTER — Encounter: Payer: Self-pay | Admitting: Internal Medicine

## 2024-05-30 VITALS — BP 110/70 | HR 77 | Ht 65.0 in | Wt 175.0 lb

## 2024-05-30 DIAGNOSIS — I480 Paroxysmal atrial fibrillation: Secondary | ICD-10-CM

## 2024-05-30 DIAGNOSIS — I10 Essential (primary) hypertension: Secondary | ICD-10-CM

## 2024-05-30 DIAGNOSIS — E785 Hyperlipidemia, unspecified: Secondary | ICD-10-CM

## 2024-05-30 NOTE — Patient Instructions (Signed)
 Medication Instructions:  Your physician recommends that you continue on your current medications as directed. Please refer to the Current Medication list given to you today.  *If you need a refill on your cardiac medications before your next appointment, please call your pharmacy*  Lab Work: CBC, BMET, NMR panel, HSCRP and BNP  If you have labs (blood work) drawn today and your tests are completely normal, you will receive your results only by: MyChart Message (if you have MyChart) OR A paper copy in the mail If you have any lab test that is abnormal or we need to change your treatment, we will call you to review the results.  Testing/Procedures: None ordered.   Follow-Up: At [facility], you and your health needs are our priority.  As part of our continuing mission to provide you with exceptional heart care, our providers are all part of one team.  This team includes your primary Cardiologist (physician) and Advanced Practice Providers or APPs (Physician Assistants and Nurse Practitioners) who all work together to provide you with the care you need, when you need it.  Your next appointment:   12 months with Dr Okey

## 2024-05-30 NOTE — Addendum Note (Signed)
 Addended by: CASIMIR ALDONA BRAVO on: 05/30/2024 08:55 AM   Modules accepted: Orders

## 2024-05-31 LAB — HIGH SENSITIVITY CRP: CRP, High Sensitivity: 10.71 mg/L — ABNORMAL HIGH (ref 0.00–3.00)

## 2024-05-31 LAB — BASIC METABOLIC PANEL WITH GFR
BUN/Creatinine Ratio: 22 (ref 12–28)
BUN: 29 mg/dL — ABNORMAL HIGH (ref 8–27)
CO2: 20 mmol/L (ref 20–29)
Calcium: 9.9 mg/dL (ref 8.7–10.3)
Chloride: 97 mmol/L (ref 96–106)
Creatinine, Ser: 1.34 mg/dL — ABNORMAL HIGH (ref 0.57–1.00)
Glucose: 106 mg/dL — ABNORMAL HIGH (ref 70–99)
Potassium: 4.3 mmol/L (ref 3.5–5.2)
Sodium: 140 mmol/L (ref 134–144)
eGFR: 41 mL/min/1.73 — ABNORMAL LOW (ref >59)

## 2024-05-31 LAB — PRO B NATRIURETIC PEPTIDE: NT-Pro BNP: 2431 pg/mL — ABNORMAL HIGH (ref 0–738)

## 2024-05-31 LAB — NMR, LIPOPROFILE
Cholesterol, Total: 287 mg/dL — ABNORMAL HIGH (ref 100–199)
HDL Particle Number: 31.6 umol/L (ref >=30.5)
HDL-C: 48 mg/dL (ref >39)
LDL Particle Number: 2315 nmol/L — ABNORMAL HIGH (ref <1000)
LDL Size: 20.3 nm — ABNORMAL LOW (ref >20.5)
LDL-C (NIH Calc): 202 mg/dL — ABNORMAL HIGH (ref 0–99)
LP-IR Score: 59 — ABNORMAL HIGH (ref <=45)
Small LDL Particle Number: 1227 nmol/L — ABNORMAL HIGH (ref <=527)
Triglycerides: 191 mg/dL — ABNORMAL HIGH (ref 0–149)

## 2024-05-31 LAB — CBC
Hematocrit: 33.6 % — ABNORMAL LOW (ref 34.0–46.6)
Hemoglobin: 10.7 g/dL — ABNORMAL LOW (ref 11.1–15.9)
MCH: 27.6 pg (ref 26.6–33.0)
MCHC: 31.8 g/dL (ref 31.5–35.7)
MCV: 87 fL (ref 79–97)
Platelets: 208 x10E3/uL (ref 150–450)
RBC: 3.87 x10E6/uL (ref 3.77–5.28)
RDW: 15.7 % — ABNORMAL HIGH (ref 11.7–15.4)
WBC: 5.2 x10E3/uL (ref 3.4–10.8)

## 2024-06-01 ENCOUNTER — Ambulatory Visit: Payer: Self-pay | Admitting: Internal Medicine

## 2024-06-01 DIAGNOSIS — Z79899 Other long term (current) drug therapy: Secondary | ICD-10-CM

## 2024-06-01 DIAGNOSIS — R7989 Other specified abnormal findings of blood chemistry: Secondary | ICD-10-CM

## 2024-06-01 DIAGNOSIS — E785 Hyperlipidemia, unspecified: Secondary | ICD-10-CM

## 2024-06-05 NOTE — Addendum Note (Signed)
 Addended by: LORING ANDRIETTE HERO on: 06/05/2024 08:06 AM   Modules accepted: Orders

## 2024-06-07 NOTE — Telephone Encounter (Signed)
 I dont have a specific name to give    [facility] [place]  [facility] seems ok and I havent had negative feed back

## 2024-06-08 ENCOUNTER — Encounter: Payer: Self-pay | Admitting: Internal Medicine

## 2024-06-10 ENCOUNTER — Ambulatory Visit
Admission: EM | Admit: 2024-06-10 | Discharge: 2024-06-10 | Disposition: A | Discharge status: Home / Self Care | Attending: Family Medicine | Admitting: Family Medicine

## 2024-06-10 ENCOUNTER — Other Ambulatory Visit: Payer: Self-pay

## 2024-06-10 DIAGNOSIS — M109 Gout, unspecified: Secondary | ICD-10-CM | POA: Diagnosis not present

## 2024-06-10 MED ORDER — PREDNISONE 50 MG PO TABS: ORAL_TABLET | ORAL | 0 refills | Status: AC

## 2024-06-10 NOTE — Discharge Instructions (Addendum)
 Take the prednisone  once a day for 5 days Make sure you are drinking lots of water I have included a list of foods that provoke gout for you to review Follow-up with your primary care doctor Do not take ibuprofen while on the prednisone .  You may take Tylenol  if needed

## 2024-06-10 NOTE — ED Provider Notes (Signed)
 " Penny Barnett    CSN: [id number] Arrival date & time: 06/10/24  1112      History   Chief Complaint Chief Complaint  Patient presents with   Foot Pain    RT    HPI Penny Barnett is a 73 y.o. female.   HPI  Patient does not have a history of gout.  She has had no injury.  No overuse.  For the last couple of days has had severe pain in the right foot and the big toe joint.  It is red.  It is swollen.  Slightly improved by taking ibuprofen last night and today.  Past Medical History:  Diagnosis Date   Allergy    Anemia    Arthritis    hands   Asthma    as a child and then attack 1996  poss allergic to cat hair. ventilated x 2 . saw Dr Frutoso then .    Atrial flutter (HCC)    a. 12/2014 Echo: EF 60-65%, no rwma, mild MR;  b. S/P TEE/DCCV;  c. CHA2DS2VASc = 3-->Eliquis .   Cataract    Had surgery to remove   GERD (gastroesophageal reflux disease)    2000 taking otc prevacid    evey day.    Heart murmur    Hyperlipidemia    Hypertension     3 meds controlled   Kidney stone    passed stone - no surgery required    Patient Active Problem List   Diagnosis Date Noted   Pneumonia due to COVID-19 virus 05/16/2020   Collagenous colitis 07/25/2016   Chronic anticoagulation 02/12/2015   Diarrhea 01/29/2015   Cardiomyopathy (HCC)    Persistent atrial fibrillation (HCC)    Atrial flutter with RVR 12/31/2014   Fatigue 12/30/2014   Hypokalemia 12/30/2014   Skin lesion poss AK  face. 10/16/2013   Encounter for routine gynecological examination 10/14/2012   LFTs abnormal 10/07/2011   Visit for preventive health examination 10/07/2011   Diverticulosis 09/20/2011   History of kidney stones 04/19/2011   Asthma    GERD (gastroesophageal reflux disease)    Hypertension    Hyperlipidemia     Past Surgical History:  Procedure Laterality Date   CARDIOVERSION N/A 01/03/2015   Procedure: CARDIOVERSION;  Surgeon: Vina Okey GAILS, MD;  Location: [facility] ENDOSCOPY;  Service:  Cardiovascular;  Laterality: N/A;   CARDIOVERSION N/A 08/06/2020   Procedure: CARDIOVERSION;  Surgeon: Mona Vinie BROCKS, MD;  Location: [facility] ENDOSCOPY;  Service: Cardiovascular;  Laterality: N/A;   CARDIOVERSION N/A 01/27/2022   Procedure: CARDIOVERSION;  Surgeon: Hobart Powell BRAVO, MD;  Location: [facility] ENDOSCOPY;  Service: Cardiovascular;  Laterality: N/A;   CATARACT EXTRACTION Bilateral 10/14/2010   both eyes   COLONOSCOPY  02/2011   [facility]   EYE SURGERY  may 2012   cataracts   SHOULDER SURGERY Bilateral    x 2 - frozen shoulder rt 2006, 2000 left    TEE WITHOUT CARDIOVERSION N/A 01/03/2015   Procedure: TRANSESOPHAGEAL ECHOCARDIOGRAM (TEE);  Surgeon: Vina Okey GAILS, MD;  Location: [facility] ENDOSCOPY;  Service: Cardiovascular;  Laterality: N/A;   TONSILLECTOMY      OB History   No obstetric history on file.      Home Medications    Prior to Admission medications  Medication Sig Start Date End Date Taking? Authorizing Provider  predniSONE  (DELTASONE ) 50 MG tablet Take once a day for 5 days.  Take with food 06/10/24  Yes Maranda Jamee Jacob, MD  acetaminophen  (TYLENOL ) 325 MG  tablet Take 325-650 mg by mouth at bedtime as needed for moderate pain.    [provider]  albuterol  (VENTOLIN  HFA) 108 (90 Base) MCG/ACT inhaler Inhale 2 puffs into the lungs every 6 (six) hours as needed for wheezing or shortness of breath. 12/16/23   Panosh, Wanda K, MD  apixaban  (ELIQUIS ) 5 MG TABS tablet TAKE 1 TABLET TWICE DAILY 03/06/24   Okey Vina GAILS, MD  bisoprolol  (ZEBETA ) 5 MG tablet TAKE 1 TABLET EVERY DAY 03/06/24   Okey Vina GAILS, MD  Cholecalciferol  (VITAMIN D -3) 125 MCG (5000 UT) TABS Take 5,000 Units by mouth daily.    [provider]  diclofenac Sodium (VOLTAREN) 1 % GEL Apply 2 g topically daily as needed (Pain).    [provider]  diphenhydramine -acetaminophen  (TYLENOL  PM) 25-500 MG TABS tablet Take 1 tablet by mouth at bedtime as needed (sleep).    [provider]   fluticasone  furoate-vilanterol (BREO ELLIPTA ) 100-25 MCG/ACT AEPB Inhale 1 puff into the lungs daily. 12/02/23   Panosh, Apolinar POUR, MD  furosemide  (LASIX ) 40 MG tablet TAKE 1 TABLET EVERY DAY 04/11/24   Okey Vina GAILS, MD  montelukast  (SINGULAIR ) 10 MG tablet TAKE 1 TABLET EVERY MORNING 01/25/24   Panosh, Wanda K, MD  Multiple Vitamins-Iron (MULTIVITAMIN/IRON PO) Take 1 tablet by mouth daily.    [provider]  Multiple Vitamins-Minerals (LUTEIN-ZEAXANTHIN PO) Take 1 tablet by mouth daily.    [provider]  omeprazole (PRILOSEC) 20 MG capsule Take 20 mg by mouth daily.    [provider]  potassium chloride  SA (KLOR-CON  M) 20 MEQ tablet Take 2 tablets (40 mEq total) by mouth daily. 04/11/24   Okey Vina GAILS, MD  Tetrahydrozoline HCl (VISINE OP) Place 1 drop into both eyes daily as needed (redness).    [provider]  triamterene -hydrochlorothiazide  (MAXZIDE-25) 37.5-25 MG tablet Take 0.5 tablets by mouth daily. Pt must keep scheduled followup appt with Cardiology in December 2025 for any more refills. FINAL attempt Thank You 05/24/24   Okey Vina GAILS, MD  vitamin B-12 (CYANOCOBALAMIN ) 500 MCG tablet Take 500 mcg by mouth daily.    [provider]    Family History Family History  Problem Relation Age of Onset   Breast cancer Mother    Bladder Cancer Mother    Cancer Mother    Uterine cancer Sister    Cancer Sister    Alcohol abuse Father    Breast cancer Maternal Grandmother    Colon cancer Neg Hx    Heart attack Neg Hx    Stroke Neg Hx    Hypertension Neg Hx    Esophageal cancer Neg Hx    Rectal cancer Neg Hx    Stomach cancer Neg Hx     Social History Social History[1]   Allergies   Statins and Penicillins   Review of Systems Review of Systems See HPI  Physical Exam Triage Vital Signs ED Triage Vitals  Encounter Vitals Group     BP 06/10/24 1134 111/66     Girls Systolic BP Percentile --      Girls Diastolic BP Percentile  --      Boys Systolic BP Percentile --      Boys Diastolic BP Percentile --      Pulse Rate 06/10/24 1134 88     Resp 06/10/24 1134 17     Temp 06/10/24 1134 (!) 97.4 F (36.3 C)     Temp Source 06/10/24 1134 Oral  SpO2 06/10/24 1134 96 %     Weight --      Height --      Head Circumference --      Peak Flow --      Pain Score 06/10/24 1133 5     Pain Loc --      Pain Education --      Exclude from Growth Chart --    No data found.  Updated Vital Signs BP 111/66 (BP Location: Right Arm)   Pulse 88   Temp (!) 97.4 F (36.3 C) (Oral)   Resp 17   SpO2 96%      Physical Exam Constitutional:      General: She is not in acute distress.    Appearance: She is well-developed.  HENT:     Head: Normocephalic and atraumatic.  Eyes:     Conjunctiva/sclera: Conjunctivae normal.     Pupils: Pupils are equal, round, and reactive to light.  Cardiovascular:     Rate and Rhythm: Normal rate.  Pulmonary:     Effort: Pulmonary effort is normal. No respiratory distress.  Abdominal:     General: There is no distension.     Palpations: Abdomen is soft.  Musculoskeletal:        General: Normal range of motion.     Cervical back: Normal range of motion.       Feet:  Feet:     Comments: Redness swelling tenderness of the right first MTP joint.  Warmth Skin:    General: Skin is warm and dry.  Neurological:     Mental Status: She is alert.      UC Treatments / Results  Labs (all labs ordered are listed, but only abnormal results are displayed) Labs Reviewed - No data to display  EKG   Radiology No results found.  Procedures Procedures (including critical Barnett time)  Medications Ordered in UC Medications - No data to display  Initial Impression / Assessment and Plan / UC Course  I have reviewed the triage vital signs and the nursing notes.  Pertinent labs & imaging results that were available during my Barnett of the patient were reviewed by me and considered in my  medical decision making (see chart for details).     Cautioned the patient against taking ibuprofen while on Eliquis  May take Tylenol  for pain Will add prednisone  Final Clinical Impressions(s) / UC Diagnoses   Final diagnoses:  Acute gout involving toe of right foot, unspecified cause     Discharge Instructions      Take the prednisone  once a day for 5 days Make sure you are drinking lots of water I have included a list of foods that provoke gout for you to review Follow-up with your primary Barnett doctor Do not take ibuprofen while on the prednisone .  You may take Tylenol  if needed   ED Prescriptions     Medication Sig Dispense Auth. Provider   predniSONE  (DELTASONE ) 50 MG tablet Take once a day for 5 days.  Take with food 5 tablet Maranda Jamee Jacob, MD      PDMP not reviewed this encounter.    [1]  Social History Tobacco Use   Smoking status: Former    Current packs/day: 0.00    Average packs/day: 1 pack/day for 37.0 years (37.0 ttl pk-yrs)    Types: Cigarettes    Start date: 06/21/1964    Quit date: 06/21/1986    Years since quitting: 37.9   Smokeless tobacco: Never  Vaping Use   Vaping status: Never Used  Substance Use Topics   Alcohol use: No   Drug use: No     Maranda Jamee Jacob, MD 06/10/24 1704  "

## 2024-06-10 NOTE — ED Triage Notes (Signed)
 Pt c/o RT foot pain since Tues. Denies injury. Ibuprofen 800mg . Slightly swollen and red. No previous hx of gout.

## 2024-06-12 ENCOUNTER — Other Ambulatory Visit: Payer: Self-pay | Admitting: Family

## 2024-06-12 MED ORDER — CYCLOBENZAPRINE HCL 5 MG PO TABS: 5.0000 mg | ORAL_TABLET | Freq: Three times a day (TID) | ORAL | 1 refills | Status: AC | PRN

## 2024-06-24 ENCOUNTER — Other Ambulatory Visit: Payer: Self-pay | Admitting: Internal Medicine

## 2024-06-28 ENCOUNTER — Encounter: Payer: Self-pay | Admitting: Internal Medicine

## 2024-08-01 ENCOUNTER — Ambulatory Visit: Admitting: Pharmacist Clinician (PhC)/ Clinical Pharmacy Specialist

## 2024-09-12 ENCOUNTER — Encounter: Admitting: Internal Medicine

## 2024-10-18 ENCOUNTER — Ambulatory Visit
